# Patient Record
Sex: Female | Born: 1940 | Race: White | Hispanic: No | Smoking: Never smoker
Health system: Southern US, Community
[De-identification: ages and names within clinical notes are randomized; demographics above are authoritative.]

## PROBLEM LIST (undated history)

## (undated) DIAGNOSIS — R7303 Prediabetes: Secondary | ICD-10-CM

## (undated) DIAGNOSIS — S42409A Unspecified fracture of lower end of unspecified humerus, initial encounter for closed fracture: Secondary | ICD-10-CM

## (undated) DIAGNOSIS — M329 Systemic lupus erythematosus, unspecified: Secondary | ICD-10-CM

## (undated) DIAGNOSIS — K222 Esophageal obstruction: Secondary | ICD-10-CM

## (undated) DIAGNOSIS — N951 Menopausal and female climacteric states: Secondary | ICD-10-CM

## (undated) DIAGNOSIS — F419 Anxiety disorder, unspecified: Secondary | ICD-10-CM

## (undated) DIAGNOSIS — F439 Reaction to severe stress, unspecified: Secondary | ICD-10-CM

## (undated) DIAGNOSIS — N824 Other female intestinal-genital tract fistulae: Secondary | ICD-10-CM

## (undated) DIAGNOSIS — K219 Gastro-esophageal reflux disease without esophagitis: Secondary | ICD-10-CM

## (undated) DIAGNOSIS — T7840XA Allergy, unspecified, initial encounter: Secondary | ICD-10-CM

## (undated) DIAGNOSIS — F32A Depression, unspecified: Secondary | ICD-10-CM

## (undated) DIAGNOSIS — J40 Bronchitis, not specified as acute or chronic: Secondary | ICD-10-CM

## (undated) DIAGNOSIS — I499 Cardiac arrhythmia, unspecified: Secondary | ICD-10-CM

## (undated) DIAGNOSIS — E119 Type 2 diabetes mellitus without complications: Secondary | ICD-10-CM

## (undated) DIAGNOSIS — L930 Discoid lupus erythematosus: Secondary | ICD-10-CM

## (undated) DIAGNOSIS — H269 Unspecified cataract: Secondary | ICD-10-CM

## (undated) DIAGNOSIS — M1990 Unspecified osteoarthritis, unspecified site: Secondary | ICD-10-CM

## (undated) DIAGNOSIS — F329 Major depressive disorder, single episode, unspecified: Secondary | ICD-10-CM

## (undated) DIAGNOSIS — Z79890 Hormone replacement therapy (postmenopausal): Secondary | ICD-10-CM

## (undated) DIAGNOSIS — Z8719 Personal history of other diseases of the digestive system: Secondary | ICD-10-CM

## (undated) DIAGNOSIS — E785 Hyperlipidemia, unspecified: Secondary | ICD-10-CM

## (undated) DIAGNOSIS — I10 Essential (primary) hypertension: Secondary | ICD-10-CM

## (undated) DIAGNOSIS — M8580 Other specified disorders of bone density and structure, unspecified site: Secondary | ICD-10-CM

## (undated) HISTORY — DX: Hyperlipidemia, unspecified: E78.5

## (undated) HISTORY — DX: Menopausal and female climacteric states: N95.1

## (undated) HISTORY — PX: TUBAL LIGATION: SHX77

## (undated) HISTORY — DX: Allergy, unspecified, initial encounter: T78.40XA

## (undated) HISTORY — DX: Esophageal obstruction: K22.2

## (undated) HISTORY — PX: EYE SURGERY: SHX253

## (undated) HISTORY — PX: TONSILLECTOMY: SUR1361

## (undated) HISTORY — PX: COLONOSCOPY: SHX174

## (undated) HISTORY — DX: Anxiety disorder, unspecified: F41.9

## (undated) HISTORY — DX: Prediabetes: R73.03

## (undated) HISTORY — DX: Unspecified fracture of lower end of unspecified humerus, initial encounter for closed fracture: S42.409A

## (undated) HISTORY — DX: Reaction to severe stress, unspecified: F43.9

## (undated) HISTORY — DX: Unspecified cataract: H26.9

## (undated) HISTORY — DX: Other female intestinal-genital tract fistulae: N82.4

## (undated) HISTORY — DX: Systemic lupus erythematosus, unspecified: M32.9

## (undated) HISTORY — DX: Hormone replacement therapy: Z79.890

## (undated) HISTORY — DX: Essential (primary) hypertension: I10

## (undated) HISTORY — DX: Discoid lupus erythematosus: L93.0

## (undated) HISTORY — DX: Other specified disorders of bone density and structure, unspecified site: M85.80

## (undated) HISTORY — PX: ABDOMINAL HYSTERECTOMY: SHX81

---

## 1969-05-02 DIAGNOSIS — S42409A Unspecified fracture of lower end of unspecified humerus, initial encounter for closed fracture: Secondary | ICD-10-CM

## 1969-05-02 HISTORY — DX: Unspecified fracture of lower end of unspecified humerus, initial encounter for closed fracture: S42.409A

## 1970-09-01 HISTORY — PX: OTHER SURGICAL HISTORY: SHX169

## 1974-09-01 HISTORY — PX: OTHER SURGICAL HISTORY: SHX169

## 1985-09-01 HISTORY — PX: OTHER SURGICAL HISTORY: SHX169

## 1998-03-30 ENCOUNTER — Ambulatory Visit (HOSPITAL_COMMUNITY): Admission: RE | Admit: 1998-03-30 | Discharge: 1998-03-30 | Payer: Self-pay | Admitting: *Deleted

## 1999-12-30 ENCOUNTER — Emergency Department (HOSPITAL_COMMUNITY): Admission: EM | Admit: 1999-12-30 | Discharge: 1999-12-30 | Payer: Self-pay | Admitting: Emergency Medicine

## 1999-12-30 ENCOUNTER — Encounter: Payer: Self-pay | Admitting: Emergency Medicine

## 2000-01-06 ENCOUNTER — Ambulatory Visit (HOSPITAL_COMMUNITY): Admission: RE | Admit: 2000-01-06 | Discharge: 2000-01-06 | Payer: Self-pay | Admitting: Family Medicine

## 2000-01-06 ENCOUNTER — Encounter: Payer: Self-pay | Admitting: Family Medicine

## 2000-05-01 ENCOUNTER — Encounter: Payer: Self-pay | Admitting: Family Medicine

## 2000-05-01 ENCOUNTER — Encounter: Admission: RE | Admit: 2000-05-01 | Discharge: 2000-05-01 | Payer: Self-pay | Admitting: Family Medicine

## 2001-02-18 ENCOUNTER — Ambulatory Visit (HOSPITAL_COMMUNITY): Admission: RE | Admit: 2001-02-18 | Discharge: 2001-02-18 | Payer: Self-pay | Admitting: Gastroenterology

## 2001-12-07 ENCOUNTER — Ambulatory Visit (HOSPITAL_BASED_OUTPATIENT_CLINIC_OR_DEPARTMENT_OTHER): Admission: RE | Admit: 2001-12-07 | Discharge: 2001-12-07 | Payer: Self-pay | Admitting: Otolaryngology

## 2002-06-09 ENCOUNTER — Encounter: Payer: Self-pay | Admitting: Urology

## 2002-06-09 ENCOUNTER — Encounter: Admission: RE | Admit: 2002-06-09 | Discharge: 2002-06-09 | Payer: Self-pay | Admitting: Urology

## 2002-09-06 ENCOUNTER — Ambulatory Visit (HOSPITAL_COMMUNITY): Admission: RE | Admit: 2002-09-06 | Discharge: 2002-09-06 | Payer: Self-pay | Admitting: Family Medicine

## 2002-09-06 ENCOUNTER — Encounter: Payer: Self-pay | Admitting: Family Medicine

## 2003-02-08 ENCOUNTER — Other Ambulatory Visit: Admission: RE | Admit: 2003-02-08 | Discharge: 2003-02-08 | Payer: Self-pay | Admitting: Family Medicine

## 2003-05-04 ENCOUNTER — Ambulatory Visit (HOSPITAL_COMMUNITY): Admission: RE | Admit: 2003-05-04 | Discharge: 2003-05-04 | Payer: Self-pay | Admitting: Otolaryngology

## 2003-05-10 ENCOUNTER — Encounter: Payer: Self-pay | Admitting: Otolaryngology

## 2003-05-10 ENCOUNTER — Encounter: Admission: RE | Admit: 2003-05-10 | Discharge: 2003-05-10 | Payer: Self-pay | Admitting: Otolaryngology

## 2003-05-11 ENCOUNTER — Ambulatory Visit (HOSPITAL_BASED_OUTPATIENT_CLINIC_OR_DEPARTMENT_OTHER): Admission: RE | Admit: 2003-05-11 | Discharge: 2003-05-11 | Payer: Self-pay | Admitting: Otolaryngology

## 2005-09-01 LAB — HM MAMMOGRAPHY

## 2006-02-25 ENCOUNTER — Ambulatory Visit: Payer: Self-pay | Admitting: Cardiology

## 2006-03-10 ENCOUNTER — Ambulatory Visit: Payer: Self-pay

## 2006-07-13 ENCOUNTER — Ambulatory Visit: Payer: Self-pay | Admitting: Cardiology

## 2006-07-22 ENCOUNTER — Ambulatory Visit: Payer: Self-pay

## 2006-07-22 ENCOUNTER — Encounter: Payer: Self-pay | Admitting: Cardiology

## 2006-07-22 ENCOUNTER — Ambulatory Visit: Payer: Self-pay | Admitting: Cardiology

## 2006-09-04 ENCOUNTER — Ambulatory Visit: Payer: Self-pay | Admitting: Cardiology

## 2006-10-20 ENCOUNTER — Ambulatory Visit: Payer: Self-pay | Admitting: Cardiology

## 2007-04-07 ENCOUNTER — Ambulatory Visit: Payer: Self-pay | Admitting: Cardiology

## 2007-12-01 ENCOUNTER — Encounter: Admission: RE | Admit: 2007-12-01 | Discharge: 2007-12-01 | Payer: Self-pay | Admitting: Obstetrics and Gynecology

## 2008-12-08 ENCOUNTER — Encounter: Payer: Self-pay | Admitting: Cardiology

## 2008-12-23 ENCOUNTER — Emergency Department (HOSPITAL_COMMUNITY): Admission: EM | Admit: 2008-12-23 | Discharge: 2008-12-23 | Payer: Self-pay | Admitting: Emergency Medicine

## 2009-01-22 ENCOUNTER — Encounter: Payer: Self-pay | Admitting: Cardiology

## 2009-01-24 ENCOUNTER — Encounter: Payer: Self-pay | Admitting: Cardiology

## 2009-02-01 DIAGNOSIS — R42 Dizziness and giddiness: Secondary | ICD-10-CM | POA: Insufficient documentation

## 2009-02-01 DIAGNOSIS — R7303 Prediabetes: Secondary | ICD-10-CM | POA: Insufficient documentation

## 2009-02-01 DIAGNOSIS — I10 Essential (primary) hypertension: Secondary | ICD-10-CM

## 2009-02-01 DIAGNOSIS — I152 Hypertension secondary to endocrine disorders: Secondary | ICD-10-CM | POA: Insufficient documentation

## 2009-02-01 DIAGNOSIS — R002 Palpitations: Secondary | ICD-10-CM | POA: Insufficient documentation

## 2009-02-01 DIAGNOSIS — E785 Hyperlipidemia, unspecified: Secondary | ICD-10-CM

## 2009-02-01 DIAGNOSIS — E1169 Type 2 diabetes mellitus with other specified complication: Secondary | ICD-10-CM | POA: Insufficient documentation

## 2009-02-02 ENCOUNTER — Ambulatory Visit: Payer: Self-pay | Admitting: Cardiology

## 2009-02-02 DIAGNOSIS — R55 Syncope and collapse: Secondary | ICD-10-CM | POA: Insufficient documentation

## 2009-02-19 ENCOUNTER — Telehealth (INDEPENDENT_AMBULATORY_CARE_PROVIDER_SITE_OTHER): Payer: Self-pay | Admitting: *Deleted

## 2009-02-20 ENCOUNTER — Encounter: Payer: Self-pay | Admitting: Cardiology

## 2009-02-20 ENCOUNTER — Ambulatory Visit: Payer: Self-pay

## 2009-04-03 ENCOUNTER — Ambulatory Visit: Payer: Self-pay | Admitting: Cardiology

## 2009-10-04 ENCOUNTER — Ambulatory Visit: Payer: Self-pay | Admitting: Cardiology

## 2010-09-24 ENCOUNTER — Encounter: Payer: Self-pay | Admitting: Cardiology

## 2010-10-03 NOTE — Assessment & Plan Note (Signed)
Summary: F6M/DM   CC:  dizziness.  History of Present Illness: Misty Smith returns for followup.  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  She had a syncopal episode in April of 2010  that we felt was most likely vagal. An echocardiogram showed normal LV function. A Myoview showed an ejection fraction of 80% and normal perfusion. These were performed on February 20, 2100. I last saw her in August of 2010. Since then the patient has dyspnea with more extreme activities but not with routine activities. It is relieved with rest. It is not associated with chest pain. There is no orthopnea, PND or pedal edema. There is no syncope. There is no exertional chest pain. She continues to have brief palpitations that are nonsustained. They're described as a skip and not associated with syncope, chest pain or shortness of breath.   Current Medications (verified): 1)  Actos 30 Mg Tabs (Pioglitazone Hcl) .Marland Kitchen.. 1 Tab By Mouth Once Daily 2)  Vytorin 10-80 Mg Tabs (Ezetimibe-Simvastatin) .... Take One Tablet By Mouth Dailyat Bedtimer 3)  Lisinopril 20 Mg Tabs (Lisinopril) .... Take One Tablet By Mouth Daily 4)  Actonel 150 Mg Tabs (Risedronate Sodium) .Marland Kitchen.. 1 Tab By Mouth Monthly 5)  Metoprolol Tartrate 50 Mg Tabs (Metoprolol Tartrate) .... 1/2 Tab Am and 1 Tab Pm 6)  Prevacid 30 Mg Cpdr (Lansoprazole) .Marland Kitchen.. 1 Tab By Mouth Once Daily 7)  Lovaza 1 Gm Caps (Omega-3-Acid Ethyl Esters) .... 2 Am 2 Pm 8)  Citracal .... 1 Tab By Mouth Once Daily 9)  Multivitamins   Tabs (Multiple Vitamin) .Marland Kitchen.. 1 Tab By Mouth Once Daily 10)  Glucosamine .Marland Kitchen.. 1 Tab By Mouth Two Times A Day 11)  Xanax 0.25 Mg Tabs (Alprazolam) .... As Needed 12)  Lexapro 5 Mg Tabs (Escitalopram Oxalate) .Marland Kitchen.. 1 Tab By Mouth Once Daily  Allergies: 1)  ! Pcn 2)  ! Sulfa  Past History:  Past Medical History: Current Problems:  DM (ICD-250.00) PALPITATIONS  (ICD-785.1) HYPERLIPIDEMIA (ICD-272.4) HYPERTENSION (ICD-401.9)  Past Surgical History: Reviewed history from 02/01/2009 and no changes required.  Bilateral myringotomies with tubes type 1 Paparella.  tonisills removed  hysterectomy  elbow  tubilgation  Social History: Reviewed history from 02/01/2009 and no changes required. Retired  Married  Tobacco Use - No.  Alcohol Use - no  Review of Systems       Some arthralgias but no fevers or chills, productive cough, hemoptysis, dysphasia, odynophagia, melena, hematochezia, dysuria, hematuria, rash, seizure activity, orthopnea, PND, pedal edema, claudication. Remaining systems are negative.   Vital Signs:  Patient profile:   70 year old female Height:      61 inches Weight:      160 pounds BMI:     30.34 Pulse rate:   51 / minute Resp:     12 per minute BP sitting:   140 / 70  (left arm)  Vitals Entered By: Misty Smith (October 04, 2009 9:38 AM)  Physical Exam  General:  Well-developed well-nourished in no acute distress.  Skin is warm and dry.  HEENT is normal.  Neck is supple. No thyromegaly.  Chest is clear to auscultation with normal expansion.  Cardiovascular exam is regular rate and rhythm.  Abdominal exam nontender or distended. No masses palpated. Extremities show no edema. neuro grossly intact    EKG  Procedure date:  10/04/2009  Findings:  Sinus bradycardia at a rate of 52. Axis normal. No ST changes.  Impression & Recommendations:  Problem # 1:  SYNCOPE (ICD-780.2) The patient has had no further episodes. I feel like her previous event was a vaguely mediated syncope. If she has further episodes in the future then we will schedule a monitor. Her updated medication list for this problem includes:    Lisinopril 20 Mg Tabs (Lisinopril) .Marland Kitchen... Take one tablet by mouth daily    Metoprolol Tartrate 50 Mg Tabs (Metoprolol tartrate) .Marland Kitchen... 1/2 tab am and 1 tab pm  Problem # 2:  PALPITATIONS  (ICD-785.1) She continues to have occasional brief palpitations but these do not appear to be significantly bothersome. Continue beta blocker. 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 Her updated medication list for this problem includes:    Lisinopril 20 Mg Tabs (Lisinopril) .Marland Kitchen... Take one tablet by mouth daily    Metoprolol Tartrate 50 Mg Tabs (Metoprolol tartrate) .Marland Kitchen... 1/2 tab am and 1 tab pm  Problem # 3:  HYPERLIPIDEMIA (ICD-272.4) Continue present medications. Lipids and liver monitored by primary care. Her updated medication list for this problem includes:    Vytorin 10-80 Mg Tabs (Ezetimibe-simvastatin) .Marland Kitchen... Take one tablet by mouth dailyat bedtimer    Lovaza 1 Gm Caps (Omega-3-acid ethyl esters) .Marland Kitchen... 2 am 2 pm  Problem # 4:  HYPERTENSION (ICD-401.9) Blood pressure controlled on present medications. Will continue. Her updated medication list for this problem includes:    Lisinopril 20 Mg Tabs (Lisinopril) .Marland Kitchen... Take one tablet by mouth daily    Metoprolol Tartrate 50 Mg Tabs (Metoprolol tartrate) .Marland Kitchen... 1/2 tab am and 1 tab pm  Problem # 5:  DM (ICD-250.00) Management per primary care. Her updated medication list for this problem includes:    Actos 30 Mg Tabs (Pioglitazone hcl) .Marland Kitchen... 1 tab by mouth once daily    Lisinopril 20 Mg Tabs (Lisinopril) .Marland Kitchen... Take one tablet by mouth daily  Patient Instructions: 1)  Your physician recommends that you schedule a follow-up appointment in: ONE YEAR

## 2010-10-28 ENCOUNTER — Encounter: Payer: Self-pay | Admitting: Cardiology

## 2010-10-28 ENCOUNTER — Other Ambulatory Visit (INDEPENDENT_AMBULATORY_CARE_PROVIDER_SITE_OTHER): Payer: Medicare Other

## 2010-10-28 ENCOUNTER — Ambulatory Visit (INDEPENDENT_AMBULATORY_CARE_PROVIDER_SITE_OTHER): Payer: Medicare Other | Admitting: Cardiology

## 2010-10-28 DIAGNOSIS — R0989 Other specified symptoms and signs involving the circulatory and respiratory systems: Secondary | ICD-10-CM

## 2010-10-28 DIAGNOSIS — I10 Essential (primary) hypertension: Secondary | ICD-10-CM

## 2010-10-28 DIAGNOSIS — R002 Palpitations: Secondary | ICD-10-CM

## 2010-11-07 NOTE — Assessment & Plan Note (Signed)
Summary: f1y/dm-rs per pt [email or web address] appt [email or web address]   CC:  pt complains of dizziness pt states she thinks its related to inner ear .  History of Present Illness: Misty Smith returns for followup.  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  She had a syncopal episode in April of 2010  that we felt was most likely vagal. An echocardiogram showed normal LV function. A Myoview showed an ejection fraction of 80% and normal perfusion. These were performed on February 20, 2009. I last saw her in Feb 2011. Since then, the patient has dyspnea with more extreme activities but not with routine activities. It is relieved with rest. It is not associated with chest pain. There is no orthopnea, PND or pedal edema. There is no syncope or palpitations. There is no exertional chest pain.   Current Medications (verified): 1)  Actos 30 Mg Tabs (Pioglitazone Hcl) .Marland Kitchen.. 1 Tab By Mouth Once Daily 2)  Lisinopril 10 Mg Tabs (Lisinopril) .... Take One Tablet By Mouth Daily 3)  Actonel 150 Mg Tabs (Risedronate Sodium) .Marland Kitchen.. 1 Tab By Mouth Monthly 4)  Metoprolol Tartrate 50 Mg Tabs (Metoprolol Tartrate) .... 1/2 Tab Am and 1 Tab Pm 5)  Nexium 40 Mg Cpdr (Esomeprazole Magnesium) .Marland Kitchen.. 1  Tab By Mouth Once Daily 6)  Lovaza 1 Gm Caps (Omega-3-Acid Ethyl Esters) .... 2 Am 2 Pm 7)  Citracal .... 1 Tab By Mouth Once Daily 8)  Multivitamins   Tabs (Multiple Vitamin) .Marland Kitchen.. 1 Tab By Mouth Once Daily 9)  Xanax 0.25 Mg Tabs (Alprazolam) .... As Needed 10)  Lexapro 10 Mg Tabs (Escitalopram Oxalate) .Marland Kitchen.. 1 Tab By Mouth Once Daily 11)  Lipitor 40 Mg Tabs (Atorvastatin Calcium) .... Take One Tablet By Mouth Daily. 12)  Preservision Areds  Caps (Multiple Vitamins-Minerals) .Marland Kitchen.. 1  Tab By Mouth Once Daily 13)  Estrace 0.1 Mg/gm Crea (Estradiol) .... As Directed  Allergies: 1)  ! Pcn 2)  ! Sulfa  Past History:  Past Medical History: DM  (ICD-250.00) PALPITATIONS (ICD-785.1) HYPERLIPIDEMIA (ICD-272.4) HYPERTENSION (ICD-401.9)  Past Surgical History: Bilateral myringotomies with tubes type 1 Paparella.  tonisills removed  hysterectomy  elbow  tubilgation  Social History: Reviewed history from 02/01/2009 and no changes required. Retired  Married  Tobacco Use - No.  Alcohol Use - no  Review of Systems       Arthralgias but no fevers or chills, productive cough, hemoptysis, dysphasia, odynophagia, melena, hematochezia, dysuria, hematuria, rash, seizure activity, orthopnea, PND, pedal edema, claudication. Remaining systems are negative.   Vital Signs:  Patient profile:   70 year old female Height:      61 inches Weight:      167 pounds BMI:     31.67 Pulse rate:   48 / minute Resp:     14 per minute BP sitting:   130 / 80  (left arm)  Vitals Entered By: Kem Parkinson (October 28, 2010 11:18 AM)  Physical Exam  General:  Well-developed well-nourished in no acute distress.  Skin is warm and dry.  HEENT is normal.  Neck is supple. No thyromegaly.  Chest is clear to auscultation with normal expansion.  Cardiovascular exam is regular rhythm and bradycardic. Abdominal exam nontender or distended. No masses palpated. Extremities show no edema. neuro grossly intact    EKG  Procedure date:  10/28/2010  Findings:      Marked sinus bradycardia at a  rate of 48. No ST changes.  Impression & Recommendations:  Problem # 1:  DM (ICD-250.00) Management per primary care. Her updated medication list for this problem includes:    Actos 30 Mg Tabs (Pioglitazone hcl) .Marland Kitchen... 1 tab by mouth once daily    Lisinopril 10 Mg Tabs (Lisinopril) .Marland Kitchen... Take one tablet by mouth daily  Problem # 2:  PALPITATIONS (ICD-785.1) Controlled with beta blockade. However her rate is mildly decreased. Decrease metoprolol to 25 mg p.o. b.i.d. Her updated medication list for this problem includes:    Lisinopril 10 Mg Tabs  (Lisinopril) .Marland Kitchen... Take one tablet by mouth daily    Metoprolol Tartrate 50 Mg Tabs (Metoprolol tartrate) ..... One half tabler by mouth two times a day  Problem # 3:  HYPERLIPIDEMIA (ICD-272.4) Continue present medications. Check lipids and liver. The following medications were removed from the medication list:    Vytorin 10-80 Mg Tabs (Ezetimibe-simvastatin) .Marland Kitchen... Take one tablet by mouth dailyat bedtimer Her updated medication list for this problem includes:    Lovaza 1 Gm Caps (Omega-3-acid ethyl esters) .Marland Kitchen... 2 am 2 pm    Lipitor 40 Mg Tabs (Atorvastatin calcium) .Marland Kitchen... Take one tablet by mouth daily.  Problem # 4:  HYPERTENSION (ICD-401.9) Blood pressure controlled. Her updated medication list for this problem includes:    Lisinopril 10 Mg Tabs (Lisinopril) .Marland Kitchen... Take one tablet by mouth daily    Metoprolol Tartrate 50 Mg Tabs (Metoprolol tartrate) ..... One half tabler by mouth two times a day  Patient Instructions: 1)  Your physician has recommended you make the following change in your medication: DECREASE METOPROLOL 25MG  TWICE DAILY 2)  Your physician wants you to follow-up in: ONE YEAR   You will receive a reminder letter in the mail two months in advance. If you don't receive a letter, please call our office to schedule the follow-up appointment. Prescriptions: METOPROLOL TARTRATE 50 MG TABS (METOPROLOL TARTRATE) one half tabler by mouth two times a day  #60 x 12   Entered by:   Deliah Goody, RN   Authorized by:   Ferman Hamming, MD, [facility]   Signed by:   Deliah Goody, RN on 10/28/2010   Method used:   Electronically to        [facility] [phone]* (retail)       [address]       [place], [place]  [address]       Ph: [phone] or [phone]       Fax: [phone]   RxID:   [id number]

## 2010-12-11 LAB — CBC
Platelets: 184 10*3/uL (ref 150–400)
WBC: 7.7 10*3/uL (ref 4.0–10.5)

## 2010-12-11 LAB — DIFFERENTIAL
Basophils Absolute: 0 10*3/uL (ref 0.0–0.1)
Basophils Relative: 0 % (ref 0–1)
Eosinophils Relative: 1 % (ref 0–5)
Lymphocytes Relative: 22 % (ref 12–46)
Monocytes Absolute: 0.6 10*3/uL (ref 0.1–1.0)

## 2010-12-11 LAB — COMPREHENSIVE METABOLIC PANEL
ALT: 36 U/L — ABNORMAL HIGH (ref 0–35)
AST: 48 U/L — ABNORMAL HIGH (ref 0–37)
Albumin: 3.4 g/dL — ABNORMAL LOW (ref 3.5–5.2)
Alkaline Phosphatase: 49 U/L (ref 39–117)
Chloride: 103 mEq/L (ref 96–112)
GFR calc Af Amer: >60 mL/min (ref >60)
Potassium: 4.1 mEq/L (ref 3.5–5.1)
Total Bilirubin: 0.6 mg/dL (ref 0.3–1.2)

## 2010-12-11 LAB — POCT CARDIAC MARKERS: Troponin i, poc: <0.05 ng/mL (ref 0.00–0.09)

## 2010-12-30 ENCOUNTER — Encounter: Payer: Self-pay | Admitting: *Deleted

## 2010-12-31 ENCOUNTER — Encounter: Payer: Self-pay | Admitting: Family Medicine

## 2011-01-14 NOTE — Assessment & Plan Note (Signed)
[facility]                            CARDIOLOGY OFFICE NOTE   Misty, [name]                      MRN:          [id number]  DATE:04/07/2007                            DOB:          01/17/1941    Misty Smith returns for followup.  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  Since I last saw her she feels  well.  She denies any dyspnea, chest pain, or syncope.  She states she  occasionally feels a skip but these are not sustained and appear to be  self-limiting.  Also note that she was having dizziness with standing  previously on higher doses of Lopressor but this is improved on lower  doses.   MEDICATIONS AT PRESENT INCLUDE:  1. Prevacid 30 mg p.o. daily.  2. Actos 30 mg p.o. daily.  3. Vytorin 10 mg/80 mg p.o. nightly.  4. Actonel 35 mg p.o. daily.  5. Caltrate 600 mg plus D b.i.d.  6. Multivitamin.  7. Glucosamine.  8. Omacor 1 gm b.i.d.  9. Lisinopril 10 mg p.o. daily.  10.Lopressor 25 mg in the morning and 50 mg in the evening.  11.Aspirin.   PHYSICAL EXAMINATION:  VITAL SIGNS:  Blood pressure 130/69.  Pulse 61.  She weighs 157 pounds.  HEENT:  Normal.  NECK:  Supple.  CHEST:  Clear.  CARDIOVASCULAR EXAM:  Regular rate and rhythm.  ABDOMINAL EXAM:  Benign.  EXTREMITIES:  Show no edema.  Electrocardiogram shows a sinus rhythm at a rate of 61.  There are no ST  changes noted.   DIAGNOSES:  1. 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  They are self-limiting at present.  We will      continue with her Lopressor at 25 mg in the morning and 50 mg in      the evening.  She understands that these are benign in the setting      of normal LV function.  2. Hypertension - Her blood pressure is well controlled  on her present      medications.  3. Hyperlipidemia - She did bring laboratories today from March 09, 2007.  Her LDL was 94 and her HDL was 56.  She will continue on her      present dose of Statin and this is being followed by Dr. Christell Constant.  4. Diabetes mellitus - per Dr. Christell Constant.  5. Dizziness - This appeared to be orthostatic in nature and has      improved on the lower doses of Lopressor.   I will see her back on an as-needed basis.     Misty Frieze Jens Som, MD, [facility]  Electronically Signed    BSC/MedQ  DD: 04/07/2007  DT: 04/07/2007  Job #: [id number]   cc:   Ernestina Penna, M.D.

## 2011-01-17 NOTE — Assessment & Plan Note (Signed)
[facility]                            CARDIOLOGY OFFICE NOTE   Misty Smith, Misty Smith                      MRN:          [id number]  DATE:09/04/2006                            DOB:          Jan 06, 1941    Misty Smith returns for followup today.  Since I last saw her she has  occasional skips, but these appear to be not problematic.  There is no  chest pain or shortness of breath.  Since we added lisinopril she has  noticed some mild presyncope with standing.   Her medications at present include:  1. Lopressor 50 mg p.o. b.i.d.  2. Prevacid 30 mg p.o. daily.  3. Actos 30 mg p.o. daily.  4. Vytorin 10/80 one p.o. nightly.  5. Actonel.  6. Caltrate.  7. Multivitamin.  8. Glucosamine.  9. Omacor.  10.Metanx.  11.Aspirin.  12.Lisinopril 10 mg p.o. daily.   PHYSICAL EXAMINATION:  Shows a blood pressure 132/67, her pulse is 63.  NECK:  Is supple.  CHEST:  Is clear.  CARDIOVASCULAR:  Shows a regular rate and rhythm.  EXTREMITIES:  Show no edema.   Her electrocardiogram today shows a sinus rhythm at a rate of 63.  There  are occasional PVCs.  The axis is normal. There are no ST changes noted.   DIAGNOSES:  1. Palpitations felt secondary to premature ventricular contractions.  2. Hypertension.  3. Hyperlipidemia.  4. Diabetes mellitus.   PLAN:  Misty Smith is complaining of episodes of presyncope with  standing.  I would like to keep her on her ACE-inhibitor given her  history of diabetes and her beta blocker given her history of PVCs and  palpitation.  It appears that these are worse in the morning after her  a.m. medications.  We will continue with her Lopressor but decrease the  dose to 50 mg in the evening and 25 in the morning and we will continue  with her lisinopril.  I have instructed her to stand slowly and to keep  track of her blood pressure and pulse at home.  She did state that her  pulse had been somewhat low at home but she did not  bring her records.  Her pulse and blood  pressure appear to be doing well today.  I will see her back in  approximately 6 weeks to make sure that her symptoms are stable.     Madolyn Frieze Jens Som, MD, [facility]  Electronically Signed    BSC/MedQ  DD: 09/04/2006  DT: 09/04/2006  Job #: [id number]   cc:   Ernestina Penna, M.D.

## 2011-01-17 NOTE — Op Note (Signed)
[place]  Patient:    Misty Smith, Misty Smith Visit Number: [id number] MRN: [id number]          Service Type: DSU Location: [facility] Attending Physician:  Waldon Merl Dictated by:   Keturah Barre, M.D. Proc. Date: 12/07/01 Admit Date:  12/07/2001 Discharge Date: 12/07/2001   CC:         [facility]  [address]  [place] [address]   Operative Report  PREOPERATIVE DIAGNOSIS:  Bilateral serous otitis.  History of otitis media. History of PE tubes in the past on the right.  POSTOPERATIVE DIAGNOSIS:  Bilateral serous otitis.  History of otitis media. History of PE tubes in the past on the right.  OPERATION PERFORMED:  Bilateral myringotomies with tubes type 1 Paparella.  SURGEON:  Keturah Barre, M.D.  ANESTHESIA:  Local with MAC.  INDICATIONS FOR PROCEDURE:  This patient has had serous otitis for several weeks and attempt was made to do a myringotomy and tubes in the office but she became diaphoretic and anxious and was having a problem with blood pressure decrease and fainting and we felt that it was more risk than we wanted to take in the office environment.  Therefore, she now enters in the outpatient environment to have this done.  DESCRIPTION OF PROCEDURE:  The patient was placed in supine position and under local anesthesia was prepped with Betadine, anesthetized with 1% Xylocaine with epinephrine and in the anteroinferior aspect of the tympanic membrane on the left, myringotomy was carried out and fluid was suctioned from behind the tympanic membrane and a type 1 Paparella PE tube was placed.  On the right side again it was anesthetized after prepping with Betadine and the right tympanic membrane had a very small previous PE tube scar with very thin tympanic membrane.  I made the incision anteriorly and again, suctioned fluid from behind the tympanic membrane and placed a right PE tube type 1  paparella. Pedotic drops were placed postoperatively and this will be continued for five days and we will be following her again in two and a half weeks, then six months and one year. Dictated by:   Keturah Barre, M.D. Attending Physician:  Waldon Merl DD:  12/07/01 TD:  12/07/01 Job: [id number]

## 2011-01-17 NOTE — Op Note (Signed)
Misty Smith, Misty Smith                         ACCOUNT NO.:  [id number]   MEDICAL RECORD NO.:  [id number]                   PATIENT TYPE:  AMB   LOCATION:  DSC                                  FACILITY:  [facility]   PHYSICIAN:  Hermelinda Medicus, M.D.                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   OPERATION PERFORMED:  Bilateral myringotomy and tubes type 2 Paparella done  in the outpatient environment because of vagal reaction in the office.   SURGEON:  Hermelinda Medicus, M.D.   ANESTHESIA:  MAC with local.   ANESTHESIOLOGIST:  Sheldon Silvan, M.D.   DESCRIPTION OF PROCEDURE:  The patient was placed in supine position under  MAC anesthesia.  When the patient was totally relaxed and under some  medication, the ears were prepped with Betadine, all cerumen was removed.  Local anesthesia 1% Xylocaine with epinephrine was injected into the  quadrants of the external ear canal and then myringotomy was carried out on  the right side where thick fluid was suctioned from behind the tympanic  membrane and the type 2 Paparella PE tube was placed without difficulty.  The Pedotic drops were placed postoperatively.  On the right side we then  did also a bilateral myringotomy and tube after prepping and anesthesia and  local anesthesia was placed with 1% Xylocaine with epinephrine and again  some clear fluid was seen.  Type 2 Paparella PE tube was placed without  difficulty followed with Pedotic drops.  The patient was awakened, tolerated  the procedure well and we expect  the Paparella type 2 tubes to last  considerably longer than the previous type 1 and  she is well aware that she cannot get her head under water without using ear  plugs and should use some cotton with Vaseline when she even washes her  hair.  She did very well and is doing well postoperatively.  Follow-up will  be in three weeks, three months, six months, and a year.                                                Hermelinda Medicus, M.D.    JC/MEDQ  D:  05/11/2003  T:  05/12/2003  Job:  [id number]   cc:   Gaynelle Cage, MD  [phone]  [place]  [place]  [address]  Fax: [phone]   Ernestina Penna, M.D.  [address] [place]  [place] [address]  Fax: [phone]

## 2011-01-17 NOTE — Assessment & Plan Note (Signed)
[facility] HEALTHC[facility]                            CARDIOLOGY OFFICE NOTE   CHARMEL, PRONOVOST                      MRN:          [id number]  DATE:10/20/2006                            DOB:          Nov 25, 1940    Ms. Enochs returns for followup today.  Since I last saw her, she states  that her dizziness with standing has improved.  She does have this to a  milder degree at times.  Of note, she also has dizziness with lying back  and changing positions in bed.  She occasionally feels her heart skip,  but there are no sustained palpitations.  There is no chest pain or  shortness of breath.   Her medications at present include:  1. Prevacid 30 mg p.o. daily.  2. Actos 30 mg daily.  3. Vytorin 10/80 nightly.  4. Actonel 35 mg p.o. daily.  5. Caltrate 600 b.i.d.  6. Multivitamin.  7. Glucosamine.  8. Omacor 1 gm p.o. b.i.d.  9. __________ daily.  10.Aspirin 81 mg p.o. daily.  11.Lisinopril 10 mg daily.  12.Lopressor 25 mg in the morning and 50 in the evening.   PHYSICAL EXAMINATION:  Shows a blood pressure of 130/70.  Pulse is 60.  NECK:  Supple.  CHEST:  Clear.  CARDIOVASCULAR EXAM:  Regular rate and rhythm.  EXTREMITIES:  Show no edema.   DIAGNOSES:  1. History of palpitations felt secondary to premature ventricular      contractions.  2. Hypertension.  3. Hyperlipidemia.  4. Diabetes mellitus.  5. Dizziness.   PLAN:  Ms. Egler states that her symptoms of dizziness with standing are  improved on the different beta blocker regimen.  We will continue with  the present medications.  Note, she also states that she does feel some  dizziness with leaning her head back, and also with changing positions  in bed.  She may have a component of vertigo.  Given that her dizziness  does increase with leaning her head back, I have also considered  vertebral insufficiency.  I think this is less likely.  I have asked her  to follow up with Dr. Christell Constant concerning  this issue.  If he feels this is  vertigo, then I will ask him to treat this as indicated.  If he thinks  it may be vertebral  insufficiency, then MRA will be indicated.  I will leave this to him.  He is also checking her lipids and liver.  I will see her back in 6  months.     Madolyn Frieze Jens Som, MD, [facility]  Electronically Signed    BSC/MedQ  DD: 10/20/2006  DT: 10/20/2006  Job #: [id number]   cc:   Ernestina Penna, M.D.

## 2011-01-17 NOTE — Procedures (Signed)
[facility]  Patient:    Misty Smith, Misty Smith                      MRN: [id number] Proc. Date: 02/18/01 Adm. Date:  [id number] Attending:  Nelda Marseille CC:         Monica Becton, M.D.   Procedure Report  PROCEDURE:  Colonoscopy.  INDICATIONS FOR PROCEDURE:  Left sided pain, bright red blood per rectum, due for colonic screening.  Consent was signed after risks, benefits, methods, and options were thoroughly discussed in the office.  MEDICINES USED:  Demerol 70, Versed 8.  DESCRIPTION OF PROCEDURE:  Rectal inspection was pertinent for external hemorrhoids. Digital exam was negative. The pediatric video colonoscope was inserted, easily advanced to the approximate level of the hepatic flexure. At that point, the scope began to loop. We first rolled her on her back, then on her right side and then back to her left side where with was some right sided abdominal pressure, we were then able to advance to the cecum which was identified by the appendiceal orifice and the ileocecal valve. No obvious abnormality was seen on insertion. The scope was slowly withdrawn. She did have a tortuous looping colon but on slow withdrawal through the colon no abnormalities were seen. Once back in the rectum, the scope was retroflexed revealing some internal hemorrhoids. Anorectal pull-through confirmed the hemorrhoids, possibly a small prolapse. Did have a small tear. Not mentioned above, the prep was adequate. There was some liquid stool that required washing and suctioning. The scope was then straightened and readvanced a short ways up the sigmoid, air was suctioned, scope removed. The patient tolerated the procedure adequately. There was no obvious or immediate complications.  ENDOSCOPIC DIAGNOSIS: 1. Internal/external hemorrhoids with tears. 2. Otherwise within normal limits to the cecum except for a tortuous    looping colon.  PLAN:  Go ahead and treat her  hemorrhoids. Try over the counter medicines to start with. Could add prescriptions for ______ [facility] creams or possibly suppositories. Follow-up p.r.n. or in two months. If pain continues probably get a CT next but probably her problem could be due to adhesions. Happy to see back p.r.n. DD:  02/18/01 TD:  02/18/01 Job: [id number]

## 2011-01-17 NOTE — Assessment & Plan Note (Signed)
[facility]                              CARDIOLOGY OFFICE NOTE   BROOKELYNN, HAMOR                      MRN:          [id number]  DATE:07/13/2006                            DOB:          08/22/1941    [name] is a very pleasant 70 year old female that I recently saw for  chest pain. At that time we scheduled her to have a nuclear study which was  performed on March 10, 2006. She was found to have an ejection fraction of  80%. There were electrocardiographic changes but the images were normal with  no ischemia or infarction. It should be noted that she did have a  hypertensive response. Over the past 1-2 months, she has noticed occasional  palpitations. They are described as a skip. They are worse at night. There  is no associated presyncope or syncope and there is no chest pain and no  shortness of breath. She did have an event monitor and I do have some strips  from that. This showed occasional PVCs. She now returns for followup. Note  she does have mild dyspnea on exertion but there is no exertional chest pain  and no history of syncope.   MEDICATIONS:  1. Lopressor 50 mg p.o. b.i.d.  2. Prevacid 30 mg p.o. daily.  3. Actos 30 mg p.o. daily.  4. Vytorin 10/80 q.h.s.  5. Actonel 35 mg p.o. daily.  6. Caltrate.  7. Glucosamine.  8. [place].  9. __________  10.Aspirin 81 mg p.o. daily.   PHYSICAL EXAMINATION:  VITAL SIGNS:  Physical examination today shows a  blood pressure of 146/82 and her pulse is 62.  NECK:  Supple.  CHEST:  Clear.  CARDIOVASCULAR:  Reveals a regular rate and rhythm.  EXTREMITIES:  Show no edema.   Her electrocardiogram shows a sinus rhythm at a rate of 62. There were no  significant ST changes.   DIAGNOSES:  1. 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  2. Hypertension.  3. History of borderline diabetes mellitus.  4. Hyperlipidemia.   PLAN:  Ms. Fluhr presents with palpitations that sound to be PVCs. We have  an  event monitor that does indeed show PVCs. We will continue with the  present dose of Lopressor. Her heart rate has been in the 50s and we will  therefore not increase this dose. She has been noted to be hypertensive  recently and we will add Lisinopril 10 mg p.o. daily. We will check a TSH,  BMET and magnesium in one week. I have explained to Ms. Patriarca that in the  setting of normal left ventricular function, PVCs are benign. She seems to  understand this. I will see her back in approximately 6 weeks to review her  symptoms to see if they have improved on the present medications.     Madolyn Frieze Jens Som, MD, [facility]  Electronically Signed    BSC/MedQ  DD: 07/13/2006  DT: 07/13/2006  Job #: [id number]   cc:   Ernestina Penna, M.D.

## 2011-02-28 ENCOUNTER — Encounter: Payer: Self-pay | Admitting: Cardiology

## 2011-06-09 ENCOUNTER — Encounter: Payer: Self-pay | Admitting: Cardiology

## 2011-09-03 DIAGNOSIS — M25579 Pain in unspecified ankle and joints of unspecified foot: Secondary | ICD-10-CM | POA: Diagnosis not present

## 2011-09-16 ENCOUNTER — Encounter: Payer: Self-pay | Admitting: Cardiology

## 2011-09-16 DIAGNOSIS — E785 Hyperlipidemia, unspecified: Secondary | ICD-10-CM | POA: Diagnosis not present

## 2011-09-16 DIAGNOSIS — E559 Vitamin D deficiency, unspecified: Secondary | ICD-10-CM | POA: Diagnosis not present

## 2011-09-16 DIAGNOSIS — E119 Type 2 diabetes mellitus without complications: Secondary | ICD-10-CM | POA: Diagnosis not present

## 2011-09-16 DIAGNOSIS — I10 Essential (primary) hypertension: Secondary | ICD-10-CM | POA: Diagnosis not present

## 2011-09-17 DIAGNOSIS — S82899A Other fracture of unspecified lower leg, initial encounter for closed fracture: Secondary | ICD-10-CM | POA: Diagnosis not present

## 2011-10-08 DIAGNOSIS — S82899A Other fracture of unspecified lower leg, initial encounter for closed fracture: Secondary | ICD-10-CM | POA: Diagnosis not present

## 2011-10-29 ENCOUNTER — Ambulatory Visit (INDEPENDENT_AMBULATORY_CARE_PROVIDER_SITE_OTHER): Payer: Medicare Other | Admitting: Cardiology

## 2011-10-29 ENCOUNTER — Encounter: Payer: Self-pay | Admitting: Cardiology

## 2011-10-29 VITALS — BP 160/72 | HR 54 | Ht 61.0 in | Wt 171.0 lb

## 2011-10-29 DIAGNOSIS — R002 Palpitations: Secondary | ICD-10-CM

## 2011-10-29 DIAGNOSIS — E785 Hyperlipidemia, unspecified: Secondary | ICD-10-CM | POA: Diagnosis not present

## 2011-10-29 DIAGNOSIS — I10 Essential (primary) hypertension: Secondary | ICD-10-CM | POA: Diagnosis not present

## 2011-10-29 NOTE — Assessment & Plan Note (Signed)
Controlled with beta-blockade. 

## 2011-10-29 NOTE — Progress Notes (Signed)
HPI: Misty Smith returns for followup.  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  She had a syncopal episode in April of 2010  that we felt was most likely vagal. An echocardiogram showed normal LV function. A Myoview showed an ejection fraction of 80% and normal perfusion. These were performed on February 20, 2009. I last saw her in Feb 2012. Since then, the patient has dyspnea with more extreme activities but not with routine activities. It is relieved with rest. It is not associated with chest pain. There is no orthopnea, PND or pedal edema. There is no syncope or palpitations. There is no exertional chest pain.  Current Outpatient Prescriptions  Medication Sig Dispense Refill  . ALPRAZolam (XANAX) 0.25 MG tablet Take 0.25 mg by mouth at bedtime as needed.        Marland Kitchen atorvastatin (LIPITOR) 40 MG tablet Take 40 mg by mouth daily.        . Calcium Carb-Vit D-Soy Isoflav (CALTRATE 600 + SOY PO) Take by mouth.        . escitalopram (LEXAPRO) 10 MG tablet Take 10 mg by mouth daily.        Marland Kitchen esomeprazole (NEXIUM) 40 MG capsule Take 40 mg by mouth daily before breakfast.        . estradiol (ESTRACE VAGINAL) 0.1 MG/GM vaginal cream Place 2 g vaginally daily. For 3 weeks         . lisinopril (PRINIVIL,ZESTRIL) 10 MG tablet Take 10 mg by mouth daily.        . meloxicam (MOBIC) 15 MG tablet Take 15 mg by mouth as needed.       . metoprolol tartrate (LOPRESSOR) 25 MG tablet Take 25 mg by mouth as directed. Take one tablet (25mg ) by mouth in the AM and two tablets (50mg ) by mouth in the PM.       . Multiple Vitamin (MULTIVITAMIN) tablet Take 1 tablet by mouth daily.        Marland Kitchen omega-3 acid ethyl esters (LOVAZA) 1 G capsule Take 2 g by mouth 2 (two) times daily.        . pioglitazone (ACTOS) 30 MG tablet Take 30 mg by mouth daily.        . risedronate (ACTONEL) 150 MG tablet Take 150 mg by mouth every 30 (thirty) days.  with water on empty stomach, nothing by mouth or lie down for next 30 minutes.          Past Medical History  Diagnosis Date  . Anxiety   . Stress   . Colovaginal fistula   . Hiatal hernia   . Hypertension   . Postmenopausal HRT (hormone replacement therapy)   . Hyperlipidemia   . Perimenopausal vasomotor symptoms   . Esophageal stricture   . Subacute lupus erythematosus     Dr. Herma Carson      Past Surgical History  Procedure Date  . Abdominal bypass 1987    fibroids   . Bilateral tubal ligtation 1972  . Tonsillectomy     History   Social History  . Marital Status: Married    Spouse Name: N/A    Number of Children: N/A  . Years of Education: N/A   Occupational History  . Not on file.   Social History Main Topics  . Smoking status: Never Smoker   . Smokeless tobacco: Not on file  . Alcohol Use: No  .  Drug Use: No  . Sexually Active: Not on file   Other Topics Concern  . Not on file   Social History Narrative  . No narrative on file    ROS: Some pain in ankle from previous fracture no fevers or chills, productive cough, hemoptysis, dysphasia, odynophagia, melena, hematochezia, dysuria, hematuria, rash, seizure activity, orthopnea, PND, pedal edema, claudication. Remaining systems are negative.  Physical Exam: Well-developed well-nourished in no acute distress.  Skin is warm and dry.  HEENT is normal.  Neck is supple.  Chest is clear to auscultation with normal expansion.  Cardiovascular exam is bradycardic Abdominal exam nontender or distended. No masses palpated. Extremities show trace edema. neuro grossly intact  ECG sinus rhythm at a rate of 54. No ST changes.

## 2011-10-29 NOTE — Assessment & Plan Note (Signed)
Management per primary care. Continue statin. 

## 2011-10-29 NOTE — Patient Instructions (Signed)
Your physician wants you to follow-up in: ONE YEAR You will receive a reminder letter in the mail two months in advance. If you don't receive a letter, please call our office to schedule the follow-up appointment.  

## 2011-10-29 NOTE — Assessment & Plan Note (Signed)
Blood pressure mildly elevated. However she follows this at home and typically normal. We will continue with present medications. She will check her values at home and we will increase her lisinopril if needed.

## 2011-11-19 DIAGNOSIS — S82899A Other fracture of unspecified lower leg, initial encounter for closed fracture: Secondary | ICD-10-CM | POA: Diagnosis not present

## 2011-11-27 ENCOUNTER — Other Ambulatory Visit: Payer: Self-pay | Admitting: Gastroenterology

## 2011-11-27 DIAGNOSIS — R109 Unspecified abdominal pain: Secondary | ICD-10-CM | POA: Diagnosis not present

## 2011-11-27 DIAGNOSIS — R1310 Dysphagia, unspecified: Secondary | ICD-10-CM | POA: Diagnosis not present

## 2011-11-27 DIAGNOSIS — K5900 Constipation, unspecified: Secondary | ICD-10-CM | POA: Diagnosis not present

## 2011-12-01 ENCOUNTER — Ambulatory Visit
Admission: RE | Admit: 2011-12-01 | Discharge: 2011-12-01 | Disposition: A | Discharge status: Home / Self Care | Payer: Medicare Other | Source: Ambulatory Visit | Attending: Gastroenterology | Admitting: Gastroenterology

## 2011-12-01 DIAGNOSIS — K5900 Constipation, unspecified: Secondary | ICD-10-CM | POA: Diagnosis not present

## 2011-12-01 DIAGNOSIS — R109 Unspecified abdominal pain: Secondary | ICD-10-CM | POA: Diagnosis not present

## 2011-12-01 MED ORDER — IOHEXOL 300 MG/ML  SOLN
100.0000 mL | Freq: Once | INTRAMUSCULAR | Status: AC | PRN
  Administered 2011-12-01: 100 mL via INTRAVENOUS

## 2011-12-30 ENCOUNTER — Encounter: Payer: Self-pay | Admitting: Cardiology

## 2011-12-30 DIAGNOSIS — I10 Essential (primary) hypertension: Secondary | ICD-10-CM | POA: Diagnosis not present

## 2011-12-30 DIAGNOSIS — E119 Type 2 diabetes mellitus without complications: Secondary | ICD-10-CM | POA: Diagnosis not present

## 2011-12-30 DIAGNOSIS — E559 Vitamin D deficiency, unspecified: Secondary | ICD-10-CM | POA: Diagnosis not present

## 2011-12-30 DIAGNOSIS — E785 Hyperlipidemia, unspecified: Secondary | ICD-10-CM | POA: Diagnosis not present

## 2012-01-08 DIAGNOSIS — I10 Essential (primary) hypertension: Secondary | ICD-10-CM | POA: Diagnosis not present

## 2012-01-08 DIAGNOSIS — E785 Hyperlipidemia, unspecified: Secondary | ICD-10-CM | POA: Diagnosis not present

## 2012-01-12 DIAGNOSIS — M545 Low back pain: Secondary | ICD-10-CM | POA: Diagnosis not present

## 2012-01-17 ENCOUNTER — Other Ambulatory Visit: Payer: Self-pay | Admitting: Cardiology

## 2012-01-19 ENCOUNTER — Ambulatory Visit: Payer: Medicare Other | Attending: Orthopedic Surgery | Admitting: Physical Therapy

## 2012-01-19 ENCOUNTER — Other Ambulatory Visit: Payer: Self-pay | Admitting: *Deleted

## 2012-01-19 DIAGNOSIS — R5381 Other malaise: Secondary | ICD-10-CM | POA: Diagnosis not present

## 2012-01-19 DIAGNOSIS — M545 Low back pain, unspecified: Secondary | ICD-10-CM | POA: Insufficient documentation

## 2012-01-19 DIAGNOSIS — IMO0001 Reserved for inherently not codable concepts without codable children: Secondary | ICD-10-CM | POA: Diagnosis not present

## 2012-01-19 MED ORDER — METOPROLOL TARTRATE 25 MG PO TABS: ORAL_TABLET | ORAL | Status: DC

## 2012-01-21 ENCOUNTER — Ambulatory Visit: Payer: Medicare Other | Admitting: Physical Therapy

## 2012-01-27 ENCOUNTER — Ambulatory Visit: Payer: Medicare Other | Admitting: *Deleted

## 2012-01-29 ENCOUNTER — Ambulatory Visit: Payer: Medicare Other | Admitting: *Deleted

## 2012-02-03 ENCOUNTER — Ambulatory Visit: Payer: Medicare Other | Attending: Orthopedic Surgery | Admitting: *Deleted

## 2012-02-03 DIAGNOSIS — N3020 Other chronic cystitis without hematuria: Secondary | ICD-10-CM | POA: Diagnosis not present

## 2012-02-03 DIAGNOSIS — M545 Low back pain, unspecified: Secondary | ICD-10-CM | POA: Insufficient documentation

## 2012-02-03 DIAGNOSIS — N39498 Other specified urinary incontinence: Secondary | ICD-10-CM | POA: Diagnosis not present

## 2012-02-03 DIAGNOSIS — R5381 Other malaise: Secondary | ICD-10-CM | POA: Insufficient documentation

## 2012-02-03 DIAGNOSIS — IMO0001 Reserved for inherently not codable concepts without codable children: Secondary | ICD-10-CM | POA: Insufficient documentation

## 2012-02-03 DIAGNOSIS — N3946 Mixed incontinence: Secondary | ICD-10-CM | POA: Diagnosis not present

## 2012-02-03 DIAGNOSIS — N952 Postmenopausal atrophic vaginitis: Secondary | ICD-10-CM | POA: Diagnosis not present

## 2012-02-05 ENCOUNTER — Ambulatory Visit: Payer: Medicare Other | Admitting: *Deleted

## 2012-02-10 ENCOUNTER — Ambulatory Visit: Payer: Medicare Other | Admitting: *Deleted

## 2012-02-12 ENCOUNTER — Ambulatory Visit: Payer: Medicare Other | Admitting: *Deleted

## 2012-02-16 ENCOUNTER — Ambulatory Visit: Payer: Medicare Other | Admitting: *Deleted

## 2012-02-18 ENCOUNTER — Ambulatory Visit: Payer: Medicare Other | Admitting: *Deleted

## 2012-02-18 DIAGNOSIS — E785 Hyperlipidemia, unspecified: Secondary | ICD-10-CM | POA: Diagnosis not present

## 2012-02-18 DIAGNOSIS — E119 Type 2 diabetes mellitus without complications: Secondary | ICD-10-CM | POA: Diagnosis not present

## 2012-03-26 DIAGNOSIS — D485 Neoplasm of uncertain behavior of skin: Secondary | ICD-10-CM | POA: Diagnosis not present

## 2012-03-26 DIAGNOSIS — L930 Discoid lupus erythematosus: Secondary | ICD-10-CM | POA: Diagnosis not present

## 2012-03-26 DIAGNOSIS — L219 Seborrheic dermatitis, unspecified: Secondary | ICD-10-CM | POA: Diagnosis not present

## 2012-04-13 DIAGNOSIS — N390 Urinary tract infection, site not specified: Secondary | ICD-10-CM | POA: Diagnosis not present

## 2012-04-14 ENCOUNTER — Other Ambulatory Visit: Payer: Self-pay | Admitting: Cardiology

## 2012-04-14 MED ORDER — METOPROLOL TARTRATE 25 MG PO TABS: ORAL_TABLET | ORAL | Status: DC

## 2012-04-14 NOTE — Telephone Encounter (Signed)
They have faxed request and we have not responded/lg

## 2012-04-28 DIAGNOSIS — E785 Hyperlipidemia, unspecified: Secondary | ICD-10-CM | POA: Diagnosis not present

## 2012-04-28 DIAGNOSIS — I10 Essential (primary) hypertension: Secondary | ICD-10-CM | POA: Diagnosis not present

## 2012-04-28 DIAGNOSIS — E559 Vitamin D deficiency, unspecified: Secondary | ICD-10-CM | POA: Diagnosis not present

## 2012-04-29 DIAGNOSIS — D485 Neoplasm of uncertain behavior of skin: Secondary | ICD-10-CM | POA: Diagnosis not present

## 2012-04-29 DIAGNOSIS — L930 Discoid lupus erythematosus: Secondary | ICD-10-CM | POA: Diagnosis not present

## 2012-05-12 DIAGNOSIS — I10 Essential (primary) hypertension: Secondary | ICD-10-CM | POA: Diagnosis not present

## 2012-05-12 DIAGNOSIS — E785 Hyperlipidemia, unspecified: Secondary | ICD-10-CM | POA: Diagnosis not present

## 2012-05-15 DIAGNOSIS — M79609 Pain in unspecified limb: Secondary | ICD-10-CM | POA: Diagnosis not present

## 2012-05-25 DIAGNOSIS — Z01419 Encounter for gynecological examination (general) (routine) without abnormal findings: Secondary | ICD-10-CM | POA: Diagnosis not present

## 2012-05-25 DIAGNOSIS — Z124 Encounter for screening for malignant neoplasm of cervix: Secondary | ICD-10-CM | POA: Diagnosis not present

## 2012-05-26 DIAGNOSIS — Z1231 Encounter for screening mammogram for malignant neoplasm of breast: Secondary | ICD-10-CM | POA: Diagnosis not present

## 2012-06-24 DIAGNOSIS — D485 Neoplasm of uncertain behavior of skin: Secondary | ICD-10-CM | POA: Diagnosis not present

## 2012-06-24 DIAGNOSIS — L930 Discoid lupus erythematosus: Secondary | ICD-10-CM | POA: Diagnosis not present

## 2012-06-28 DIAGNOSIS — Z23 Encounter for immunization: Secondary | ICD-10-CM | POA: Diagnosis not present

## 2012-07-05 DIAGNOSIS — M76899 Other specified enthesopathies of unspecified lower limb, excluding foot: Secondary | ICD-10-CM | POA: Diagnosis not present

## 2012-07-07 ENCOUNTER — Ambulatory Visit: Payer: Medicare Other | Attending: Orthopedic Surgery | Admitting: Physical Therapy

## 2012-07-07 DIAGNOSIS — M25559 Pain in unspecified hip: Secondary | ICD-10-CM | POA: Diagnosis not present

## 2012-07-07 DIAGNOSIS — IMO0001 Reserved for inherently not codable concepts without codable children: Secondary | ICD-10-CM | POA: Insufficient documentation

## 2012-07-07 DIAGNOSIS — R5381 Other malaise: Secondary | ICD-10-CM | POA: Diagnosis not present

## 2012-07-08 ENCOUNTER — Ambulatory Visit: Payer: Medicare Other | Admitting: Physical Therapy

## 2012-07-12 ENCOUNTER — Ambulatory Visit: Payer: Medicare Other | Admitting: Physical Therapy

## 2012-07-14 ENCOUNTER — Ambulatory Visit: Payer: Medicare Other | Admitting: Physical Therapy

## 2012-07-19 ENCOUNTER — Ambulatory Visit: Payer: Medicare Other | Admitting: Physical Therapy

## 2012-07-21 ENCOUNTER — Ambulatory Visit: Payer: Medicare Other | Admitting: Physical Therapy

## 2012-07-26 ENCOUNTER — Ambulatory Visit: Payer: Medicare Other | Admitting: Physical Therapy

## 2012-08-02 ENCOUNTER — Ambulatory Visit: Payer: Medicare Other | Attending: Orthopedic Surgery | Admitting: Physical Therapy

## 2012-08-02 DIAGNOSIS — IMO0001 Reserved for inherently not codable concepts without codable children: Secondary | ICD-10-CM | POA: Diagnosis not present

## 2012-08-02 DIAGNOSIS — R5381 Other malaise: Secondary | ICD-10-CM | POA: Insufficient documentation

## 2012-08-02 DIAGNOSIS — M25559 Pain in unspecified hip: Secondary | ICD-10-CM | POA: Diagnosis not present

## 2012-08-03 DIAGNOSIS — H2510 Age-related nuclear cataract, unspecified eye: Secondary | ICD-10-CM | POA: Diagnosis not present

## 2012-08-03 DIAGNOSIS — H04129 Dry eye syndrome of unspecified lacrimal gland: Secondary | ICD-10-CM | POA: Diagnosis not present

## 2012-08-03 DIAGNOSIS — H40019 Open angle with borderline findings, low risk, unspecified eye: Secondary | ICD-10-CM | POA: Diagnosis not present

## 2012-08-03 DIAGNOSIS — D3130 Benign neoplasm of unspecified choroid: Secondary | ICD-10-CM | POA: Diagnosis not present

## 2012-08-03 DIAGNOSIS — M5137 Other intervertebral disc degeneration, lumbosacral region: Secondary | ICD-10-CM | POA: Diagnosis not present

## 2012-08-04 ENCOUNTER — Encounter: Payer: Medicare Other | Admitting: Physical Therapy

## 2012-08-05 ENCOUNTER — Ambulatory Visit: Payer: Medicare Other | Admitting: Physical Therapy

## 2012-08-05 ENCOUNTER — Encounter: Payer: Self-pay | Admitting: Cardiology

## 2012-08-05 DIAGNOSIS — E559 Vitamin D deficiency, unspecified: Secondary | ICD-10-CM | POA: Diagnosis not present

## 2012-08-05 DIAGNOSIS — E785 Hyperlipidemia, unspecified: Secondary | ICD-10-CM | POA: Diagnosis not present

## 2012-08-05 DIAGNOSIS — I10 Essential (primary) hypertension: Secondary | ICD-10-CM | POA: Diagnosis not present

## 2012-08-07 ENCOUNTER — Other Ambulatory Visit: Payer: Self-pay | Admitting: Cardiology

## 2012-08-09 ENCOUNTER — Ambulatory Visit: Payer: Medicare Other | Admitting: Physical Therapy

## 2012-08-18 DIAGNOSIS — M76899 Other specified enthesopathies of unspecified lower limb, excluding foot: Secondary | ICD-10-CM | POA: Diagnosis not present

## 2012-08-19 DIAGNOSIS — I10 Essential (primary) hypertension: Secondary | ICD-10-CM | POA: Diagnosis not present

## 2012-08-19 DIAGNOSIS — E785 Hyperlipidemia, unspecified: Secondary | ICD-10-CM | POA: Diagnosis not present

## 2012-08-30 DIAGNOSIS — Z1212 Encounter for screening for malignant neoplasm of rectum: Secondary | ICD-10-CM | POA: Diagnosis not present

## 2012-10-08 DIAGNOSIS — M79609 Pain in unspecified limb: Secondary | ICD-10-CM | POA: Diagnosis not present

## 2012-11-17 ENCOUNTER — Other Ambulatory Visit (INDEPENDENT_AMBULATORY_CARE_PROVIDER_SITE_OTHER): Payer: Medicare Other

## 2012-11-17 DIAGNOSIS — E559 Vitamin D deficiency, unspecified: Secondary | ICD-10-CM

## 2012-11-17 DIAGNOSIS — I10 Essential (primary) hypertension: Secondary | ICD-10-CM

## 2012-11-17 DIAGNOSIS — R5383 Other fatigue: Secondary | ICD-10-CM

## 2012-11-17 DIAGNOSIS — E785 Hyperlipidemia, unspecified: Secondary | ICD-10-CM | POA: Diagnosis not present

## 2012-11-17 DIAGNOSIS — R5381 Other malaise: Secondary | ICD-10-CM

## 2012-11-17 LAB — POCT CBC
Hemoglobin: 12.9 g/dL (ref 12.2–16.2)
Lymph, poc: 2.2 (ref 0.6–3.4)
MCH, POC: 30.9 pg (ref 27–31.2)
MCHC: 33.2 g/dL (ref 31.8–35.4)
MCV: 93.2 fL (ref 80–97)
MPV: 8.1 fL (ref 0–99.8)
RBC: 4.2 M/uL (ref 4.04–5.48)
WBC: 6.2 10*3/uL (ref 4.6–10.2)

## 2012-11-17 LAB — HEPATIC FUNCTION PANEL
ALT: 38 U/L — ABNORMAL HIGH (ref 0–35)
AST: 40 U/L — ABNORMAL HIGH (ref 0–37)
Alkaline Phosphatase: 62 U/L (ref 39–117)
Bilirubin, Direct: 0.1 mg/dL (ref 0.0–0.3)
Total Bilirubin: 0.6 mg/dL (ref 0.3–1.2)

## 2012-11-17 LAB — BASIC METABOLIC PANEL
BUN: 12 mg/dL (ref 6–23)
Calcium: 10 mg/dL (ref 8.4–10.5)
Glucose, Bld: 101 mg/dL — ABNORMAL HIGH (ref 70–99)
Potassium: 4.8 mEq/L (ref 3.5–5.3)
Sodium: 139 mEq/L (ref 135–145)

## 2012-11-17 NOTE — Progress Notes (Signed)
Patient here for labs only. 

## 2012-11-20 ENCOUNTER — Other Ambulatory Visit: Payer: Self-pay | Admitting: Family Medicine

## 2012-11-24 DIAGNOSIS — M76899 Other specified enthesopathies of unspecified lower limb, excluding foot: Secondary | ICD-10-CM | POA: Diagnosis not present

## 2012-11-29 ENCOUNTER — Encounter: Payer: Self-pay | Admitting: Cardiology

## 2012-11-29 ENCOUNTER — Ambulatory Visit (INDEPENDENT_AMBULATORY_CARE_PROVIDER_SITE_OTHER): Payer: Medicare Other | Admitting: Cardiology

## 2012-11-29 VITALS — BP 120/70 | HR 49 | Wt 175.0 lb

## 2012-11-29 DIAGNOSIS — E785 Hyperlipidemia, unspecified: Secondary | ICD-10-CM

## 2012-11-29 DIAGNOSIS — R002 Palpitations: Secondary | ICD-10-CM | POA: Diagnosis not present

## 2012-11-29 DIAGNOSIS — I10 Essential (primary) hypertension: Secondary | ICD-10-CM | POA: Diagnosis not present

## 2012-11-29 NOTE — Assessment & Plan Note (Signed)
Continue statin. Lipids and liver monitored by primary care. 

## 2012-11-29 NOTE — Assessment & Plan Note (Signed)
Blood pressure controlled. Continue present medications. 

## 2012-11-29 NOTE — Assessment & Plan Note (Signed)
Reasonably well controlled with metoprolol. She is mildly bradycardic but not having symptoms and I therefore will make no changes.

## 2012-11-29 NOTE — Progress Notes (Signed)
HPI: Mrs. Blickenstaff returns for followup. She is a pleasant female whom I have seen in the past secondary to hypertension, hyperlipidemia, and palpitations felt secondary to PVCs. She had a syncopal episode in April of 2010 that we felt was most likely vagal. An echocardiogram showed normal LV function. A Myoview showed an ejection fraction of 80% and normal perfusion. These were performed on February 20, 2009. I last saw her in Feb 2013. Since then, the patient has dyspnea with more extreme activities but not with routine activities. It is relieved with rest. It is not associated with chest pain. There is no orthopnea, PND or pedal edema. There is no syncope or palpitations. There is no exertional chest pain.   Current Outpatient Prescriptions  Medication Sig Dispense Refill  . ALPRAZolam (XANAX) 0.25 MG tablet Take 0.25 mg by mouth at bedtime as needed.        Marland Kitchen atorvastatin (LIPITOR) 40 MG tablet Take 40 mg by mouth daily.        . Calcium Carb-Vit D-Soy Isoflav (CALTRATE 600 + SOY PO) Take by mouth.        . escitalopram (LEXAPRO) 10 MG tablet Take 10 mg by mouth daily.        Marland Kitchen esomeprazole (NEXIUM) 40 MG capsule Take 40 mg by mouth daily before breakfast.        . estradiol (ESTRACE VAGINAL) 0.1 MG/GM vaginal cream Place 2 g vaginally daily. For 3 weeks         . lisinopril (PRINIVIL,ZESTRIL) 20 MG tablet Take 20 mg by mouth daily.      . meloxicam (MOBIC) 15 MG tablet Take 15 mg by mouth as needed.       . metoprolol tartrate (LOPRESSOR) 25 MG tablet       . Multiple Vitamin (MULTIVITAMIN) tablet Take 1 tablet by mouth daily.        Marland Kitchen omega-3 acid ethyl esters (LOVAZA) 1 G capsule Take 2 g by mouth 2 (two) times daily.        . pioglitazone (ACTOS) 30 MG tablet TAKE 1 TABLET BY MOUTH EVERY DAY  30 tablet  0  . risedronate (ACTONEL) 150 MG tablet Take 150 mg by mouth every 30 (thirty) days. with water on empty stomach, nothing by mouth or lie down for next 30 minutes.        No current  facility-administered medications for this visit.     Past Medical History  Diagnosis Date  . Anxiety   . Stress   . Colovaginal fistula   . Hiatal hernia   . Hypertension   . Postmenopausal HRT (hormone replacement therapy)   . Hyperlipidemia   . Perimenopausal vasomotor symptoms   . Esophageal stricture   . Subacute lupus erythematosus     Dr. Herma Carson      Past Surgical History  Procedure Laterality Date  . Abdominal bypass  1987    fibroids   . Bilateral tubal ligtation  1972  . Tonsillectomy      History   Social History  . Marital Status: Married    Spouse Name: N/A    Number of Children: N/A  . Years of Education: N/A   Occupational History  . Not on file.   Social History Main Topics  . Smoking status: Never Smoker   . Smokeless tobacco: Not on file  . Alcohol Use: No  . Drug Use: No  . Sexually Active: Not on file   Other Topics Concern  .  Not on file   Social History Narrative  . No narrative on file    ROS: no fevers or chills, productive cough, hemoptysis, dysphasia, odynophagia, melena, hematochezia, dysuria, hematuria, rash, seizure activity, orthopnea, PND, pedal edema, claudication. Remaining systems are negative.  Physical Exam: Well-developed well-nourished in no acute distress.  Skin is warm and dry.  HEENT is normal.  Neck is supple.  Chest is clear to auscultation with normal expansion.  Cardiovascular exam is regular rate and rhythm.  Abdominal exam nontender or distended. No masses palpated. Extremities show no edema. neuro grossly intact  ECG marked sinus bradycardia at a rate of 49. No ST changes.

## 2012-11-29 NOTE — Patient Instructions (Addendum)
Continue same medications.   Your physician wants you to follow-up in: 1 year.  You will receive a reminder letter in the mail two months in advance. If you don't receive a letter, please call our office to schedule the follow-up appointment.  

## 2012-12-01 ENCOUNTER — Other Ambulatory Visit: Payer: Self-pay | Admitting: Gastroenterology

## 2012-12-01 DIAGNOSIS — R911 Solitary pulmonary nodule: Secondary | ICD-10-CM

## 2012-12-02 ENCOUNTER — Ambulatory Visit (INDEPENDENT_AMBULATORY_CARE_PROVIDER_SITE_OTHER): Payer: Medicare Other | Admitting: Family Medicine

## 2012-12-02 ENCOUNTER — Encounter: Payer: Self-pay | Admitting: Family Medicine

## 2012-12-02 VITALS — BP 140/71 | HR 52 | Temp 97.0°F | Ht 60.0 in | Wt 173.0 lb

## 2012-12-02 DIAGNOSIS — J309 Allergic rhinitis, unspecified: Secondary | ICD-10-CM

## 2012-12-02 DIAGNOSIS — I10 Essential (primary) hypertension: Secondary | ICD-10-CM

## 2012-12-02 DIAGNOSIS — E785 Hyperlipidemia, unspecified: Secondary | ICD-10-CM | POA: Diagnosis not present

## 2012-12-02 DIAGNOSIS — R42 Dizziness and giddiness: Secondary | ICD-10-CM

## 2012-12-02 DIAGNOSIS — E119 Type 2 diabetes mellitus without complications: Secondary | ICD-10-CM | POA: Diagnosis not present

## 2012-12-02 NOTE — Patient Instructions (Addendum)
Fall precautions discussed Continue current meds and therapeutic lifestyle changes nasacort aq otc is good for allergic rhinitis

## 2012-12-02 NOTE — Progress Notes (Addendum)
Subjective:    Patient ID: Misty Smith, female    DOB: [birth year], 72 y.o.   MRN: [id number]  HPI  This patient presents for recheck of multiple medical problems. Her husband accompanies the patient today.  Patient Active Problem List  Diagnosis  . DM  . HYPERLIPIDEMIA  . HYPERTENSION  . SYNCOPE  . DIZZINESS  . PALPITATIONS    In addition, .see ROS The allergies, current medications, past medical history, surgical history, family and social history are reviewed.  Immunizations reviewed.  Health maintenance reviewed.  The following items are outstanding: None     Review of Systems  HENT: Positive for congestion (slight) and postnasal drip.   Eyes: Positive for itching (slight due to allergies).  Respiratory: Positive for cough (slightly prod, clear).   Cardiovascular: Positive for palpitations (PVC's occasionally). Negative for chest pain.  Gastrointestinal: Positive for nausea.       Heartburn  Genitourinary: Positive for frequency. Negative for dysuria, vaginal bleeding, vaginal discharge, vaginal pain and pelvic pain.  Musculoskeletal: Positive for back pain (LBP), joint swelling and arthralgias (Bilateral hips and knees).  Allergic/Immunologic: Positive for environmental allergies.  Neurological: Positive for dizziness (positional) and headaches (occasional due to sinus).  Psychiatric/Behavioral: Negative.    labs were reviewed with patient, advanced lipid panel with scanned in     Objective:   Physical Exam BP 140/71  Pulse 52  Temp(Src) 97 F (36.1 C) (Oral)  Ht 5' (1.524 m)  Wt 173 lb (78.472 kg)  BMI 33.79 kg/m2  The patient appeared well nourished and normally developed, alert and oriented to time and place. Speech, behavior and judgement appear normal. Vital signs as documented.  Head exam is unremarkable. No scleral icterus or pallor noted.  Neck is without jugular venous distension, thyromegally, or carotid bruits. Carotid upstrokes are brisk  bilaterally. No cervical adenopathy. Lungs are clear anteriorly and posteriorly to auscultation. Normal respiratory effort. Cardiac exam reveals regular rate and rhythm. First and second heart sounds normal. No murmurs, rubs or gallops.  Abdominal exam reveals normal bowl sounds, no masses, no organomegaly and no aortic enlargement. No inguinal adenopathy. Some epigastric tenderness Extremities are nonedematous and both femoral and pedal pulses are normal. Diabetic foot exam was conducted and normal Skin without pallor or jaundice.  Warm and dry, without rash. Neurologic exam reveals normal deep tendon reflexes and normal sensation.  Results for orders placed in visit on 11/17/12  BASIC METABOLIC PANEL      Result Value Range   Sodium 139  135 - 145 mEq/L   Potassium 4.8  3.5 - 5.3 mEq/L   Chloride 100  96 - 112 mEq/L   CO2 30  19 - 32 mEq/L   Glucose, Bld 101 (*) 70 - 99 mg/dL   BUN 12  6 - 23 mg/dL   Creat 7.84  6.96 - 2.95 mg/dL   Calcium 28.4  8.4 - 13.2 mg/dL  NMR, LIPOPROFILE      Result Value Range   Sent to [facility] SENT 4.20.14    VITAMIN D 25 HYDROXY      Result Value Range   Vit D, 25-Hydroxy 53  30 - 89 ng/mL  HEPATIC FUNCTION PANEL      Result Value Range   Total Bilirubin 0.6  0.3 - 1.2 mg/dL   Bilirubin, Direct 0.1  0.0 - 0.3 mg/dL   Indirect Bilirubin 0.5  0.0 - 0.9 mg/dL   Alkaline Phosphatase 62  39 - 117 U/L   AST  40 (*) 0 - 37 U/L   ALT 38 (*) 0 - 35 U/L   Total Protein 6.5  6.0 - 8.3 g/dL   Albumin 4.2  3.5 - 5.2 g/dL  POCT CBC      Result Value Range   WBC 6.2  4.6 - 10.2 K/uL   Lymph, poc 2.2  0.6 - 3.4   POC LYMPH PERCENT 352 (*) 10 - 50 %L   MID (cbc)    0 - 0.9   POC MID %    0 - 12 %M   POC Granulocyte 3.5  2 - 6.9   Granulocyte percent 56.4  37 - 80 %G   RBC 4.2  4.04 - 5.48 M/uL   Hemoglobin 12.9  12.2 - 16.2 g/dL   HCT, POC 57.8  46.9 - 47.9 %   MCV 93.2  80 - 97 fL   MCH, POC 30.9  27 - 31.2 pg   MCHC 33.2  31.8 - 35.4 g/dL   RDW,  POC 62.9     Platelet Count, POC 200.0  142 - 424 K/uL   MPV 8.1  0 - 99.8 fL           Assessment & Plan:  . 1. DM  2. HYPERLIPIDEMIA   3. HYPERTENSION   4. DIZZINESS   5. Allergic rhinitis  6. Continue diet exercise and current medications  7. CT scan of lungs as ordered by Dr. Ewing Schlein

## 2012-12-06 ENCOUNTER — Other Ambulatory Visit: Payer: Medicare Other

## 2012-12-07 ENCOUNTER — Other Ambulatory Visit: Payer: Self-pay | Admitting: Family Medicine

## 2012-12-07 ENCOUNTER — Ambulatory Visit
Admission: RE | Admit: 2012-12-07 | Discharge: 2012-12-07 | Disposition: A | Discharge status: Home / Self Care | Payer: Medicare Other | Source: Ambulatory Visit | Attending: Gastroenterology | Admitting: Gastroenterology

## 2012-12-07 DIAGNOSIS — J984 Other disorders of lung: Secondary | ICD-10-CM | POA: Diagnosis not present

## 2012-12-07 DIAGNOSIS — K449 Diaphragmatic hernia without obstruction or gangrene: Secondary | ICD-10-CM | POA: Diagnosis not present

## 2012-12-07 DIAGNOSIS — R911 Solitary pulmonary nodule: Secondary | ICD-10-CM

## 2012-12-08 ENCOUNTER — Other Ambulatory Visit: Payer: Self-pay | Admitting: *Deleted

## 2012-12-08 DIAGNOSIS — M799 Soft tissue disorder, unspecified: Secondary | ICD-10-CM

## 2012-12-08 DIAGNOSIS — R918 Other nonspecific abnormal finding of lung field: Secondary | ICD-10-CM

## 2012-12-10 ENCOUNTER — Other Ambulatory Visit: Payer: Self-pay | Admitting: *Deleted

## 2012-12-10 DIAGNOSIS — R918 Other nonspecific abnormal finding of lung field: Secondary | ICD-10-CM

## 2012-12-10 DIAGNOSIS — M799 Soft tissue disorder, unspecified: Secondary | ICD-10-CM

## 2012-12-10 DIAGNOSIS — M7989 Other specified soft tissue disorders: Secondary | ICD-10-CM

## 2012-12-10 NOTE — Progress Notes (Signed)
6 month CT of chest recommended to follow-up on soft tissue lesion to the right of the midline.

## 2012-12-28 ENCOUNTER — Other Ambulatory Visit: Payer: Self-pay | Admitting: *Deleted

## 2012-12-28 MED ORDER — PIOGLITAZONE HCL 30 MG PO TABS: 30.0000 mg | ORAL_TABLET | Freq: Every day | ORAL | Status: DC

## 2012-12-28 MED ORDER — LISINOPRIL 20 MG PO TABS: 20.0000 mg | ORAL_TABLET | Freq: Every day | ORAL | Status: DC

## 2013-01-25 ENCOUNTER — Encounter: Payer: Self-pay | Admitting: Family Medicine

## 2013-01-25 ENCOUNTER — Encounter (HOSPITAL_COMMUNITY): Payer: Self-pay

## 2013-01-25 ENCOUNTER — Ambulatory Visit (INDEPENDENT_AMBULATORY_CARE_PROVIDER_SITE_OTHER): Payer: Medicare Other

## 2013-01-25 ENCOUNTER — Ambulatory Visit (HOSPITAL_COMMUNITY)
Admission: RE | Admit: 2013-01-25 | Discharge: 2013-01-25 | Disposition: A | Discharge status: Home / Self Care | Payer: Medicare Other | Source: Ambulatory Visit | Attending: Family Medicine | Admitting: Family Medicine

## 2013-01-25 ENCOUNTER — Ambulatory Visit (INDEPENDENT_AMBULATORY_CARE_PROVIDER_SITE_OTHER): Payer: Medicare Other | Admitting: Family Medicine

## 2013-01-25 VITALS — BP 153/74 | HR 61 | Temp 97.6°F | Ht 60.0 in | Wt 175.0 lb

## 2013-01-25 DIAGNOSIS — M79609 Pain in unspecified limb: Secondary | ICD-10-CM | POA: Diagnosis not present

## 2013-01-25 DIAGNOSIS — S0003XA Contusion of scalp, initial encounter: Secondary | ICD-10-CM | POA: Diagnosis not present

## 2013-01-25 DIAGNOSIS — S62609A Fracture of unspecified phalanx of unspecified finger, initial encounter for closed fracture: Secondary | ICD-10-CM

## 2013-01-25 DIAGNOSIS — R51 Headache: Secondary | ICD-10-CM | POA: Diagnosis not present

## 2013-01-25 DIAGNOSIS — S8000XA Contusion of unspecified knee, initial encounter: Secondary | ICD-10-CM | POA: Diagnosis not present

## 2013-01-25 DIAGNOSIS — M79645 Pain in left finger(s): Secondary | ICD-10-CM

## 2013-01-25 DIAGNOSIS — S62607A Fracture of unspecified phalanx of left little finger, initial encounter for closed fracture: Secondary | ICD-10-CM

## 2013-01-25 DIAGNOSIS — W19XXXA Unspecified fall, initial encounter: Secondary | ICD-10-CM | POA: Insufficient documentation

## 2013-01-25 DIAGNOSIS — S1093XA Contusion of unspecified part of neck, initial encounter: Secondary | ICD-10-CM | POA: Diagnosis not present

## 2013-01-25 DIAGNOSIS — S0093XA Contusion of unspecified part of head, initial encounter: Secondary | ICD-10-CM

## 2013-01-25 DIAGNOSIS — S0083XA Contusion of other part of head, initial encounter: Secondary | ICD-10-CM | POA: Diagnosis not present

## 2013-01-25 DIAGNOSIS — S8001XA Contusion of right knee, initial encounter: Secondary | ICD-10-CM

## 2013-01-25 NOTE — Progress Notes (Signed)
Subjective:    Patient ID: Misty Smith, female    DOB: [birth year], 72 y.o.   MRN: [id number]  HPI Patient stumbled off of a chair earlier that goes to the basement this morning. She hit the back of her. She has fractured her left fifth finger. And she has a contusion to the right knee.   Review of Systems  Eyes: Negative for visual disturbance.  Gastrointestinal: Negative for nausea.  Neurological: Positive for headaches (slight). Negative for dizziness.       Objective:   Physical Exam  Vitals reviewed. Constitutional: She is oriented to person, place, and time. She appears well-developed and well-nourished. No distress.  HENT:  Head: Normocephalic.  Right Ear: External ear normal.  Left Ear: External ear normal.  Nose: Nose normal.  Mouth/Throat: Oropharynx is clear and moist.  She has a hematoma to the occipital scalp  Eyes: Conjunctivae and EOM are normal. Pupils are equal, round, and reactive to light. Right eye exhibits no discharge. Left eye exhibits no discharge. No scleral icterus.  Neck: Normal range of motion. Neck supple. No thyromegaly present.  Cardiovascular: Normal rate, regular rhythm and normal heart sounds.   Pulmonary/Chest: Effort normal and breath sounds normal. No respiratory distress. She has no wheezes. She has no rales. She exhibits no tenderness.  Musculoskeletal: Normal range of motion. She exhibits edema.  The left fifth finger is swollen proximal to the PIP joint. She has a contusion of the right knee and a slight abrasion going down the leg.  Lymphadenopathy:    She has no cervical adenopathy.  Neurological: She is alert and oriented to person, place, and time. She has normal reflexes.  Skin: Skin is warm and dry. She is not diaphoretic.  Psychiatric: She has a normal mood and affect. Her behavior is normal. Judgment and thought content normal.    [facility] reading (PRIMARY) by  Dr. Christell Constant; fracture of fifth proximal phalange                                      Assessment & Plan:  1. Finger pain, left - DG Finger Little Left; Future -Splint  2. Head contusion, initial encounter - CT Head Wo Contrast; Future  3. Knee contusion, right, initial encounter -Dressing applied to abrasion  4. Fracture of phalanx of left little finger, closed, initial encounter -Metal splint applied to the finger   Patient Instructions  Head Injury, Adult You have had a head injury that does not appear serious at this time. A concussion is a state of changed mental ability, usually from a blow to the head. You should take clear liquids for the rest of the day and then resume your regular diet. You should not take sedatives or alcoholic beverages for as long as directed by your caregiver after discharge. After injuries such as yours, most problems occur within the first 24 hours. SYMPTOMS These minor symptoms may be experienced after discharge:  Memory difficulties.  Dizziness.  Headaches.  Double vision.  Hearing difficulties.  Depression.  Tiredness.  Weakness.  Difficulty with concentration. If you experience any of these problems, you should not be alarmed. A concussion requires a few days for recovery. Many patients with head injuries frequently experience such symptoms. Usually, these problems disappear without medical care. If symptoms last for more than one day, notify your caregiver. See your caregiver sooner if symptoms are becoming worse rather than  better. HOME CARE INSTRUCTIONS   During the next 24 hours you must stay with someone who can watch you for the warning signs listed below. Although it is unlikely that serious side effects will occur, you should be aware of signs and symptoms which may necessitate your return to this location. Side effects may occur up to 7  10 days following the injury. It is important for you to carefully monitor your condition and contact your caregiver or seek immediate medical attention if there  is a change in your condition. SEEK IMMEDIATE MEDICAL CARE IF:   There is confusion or drowsiness.  You can not awaken the injured person.  There is nausea (feeling sick to your stomach) or continued, forceful vomiting.  You notice dizziness or unsteadiness which is getting worse, or inability to walk.  You have convulsions or unconsciousness.  You experience severe, persistent headaches not relieved by over-the-counter or prescription medicines for pain. (Do not take aspirin as this impairs clotting abilities). Take other pain medications only as directed.  You can not use arms or legs normally.  There is clear or bloody discharge from the nose or ears. MAKE SURE YOU:   Understand these instructions.  Will watch your condition.  Will get help right away if you are not doing well or get worse. Document Released: 08/18/2005 Document Revised: 11/10/2011 Document Reviewed: 07/06/2009 [facility] Patient Information 2014 [place], [place].     Apply ice to head contusion Apply and elevate left hand and use ice Ice to right knee Follow head injury precautions above and call us immediately if any signs or problems CT of head is being arranged as a precaution If anything is needed for pain take only Tylenol

## 2013-01-25 NOTE — Patient Instructions (Addendum)
Head Injury, Adult You have had a head injury that does not appear serious at this time. A concussion is a state of changed mental ability, usually from a blow to the head. You should take clear liquids for the rest of the day and then resume your regular diet. You should not take sedatives or alcoholic beverages for as long as directed by your caregiver after discharge. After injuries such as yours, most problems occur within the first 24 hours. SYMPTOMS These minor symptoms may be experienced after discharge:  Memory difficulties.  Dizziness.  Headaches.  Double vision.  Hearing difficulties.  Depression.  Tiredness.  Weakness.  Difficulty with concentration. If you experience any of these problems, you should not be alarmed. A concussion requires a few days for recovery. Many patients with head injuries frequently experience such symptoms. Usually, these problems disappear without medical care. If symptoms last for more than one day, notify your caregiver. See your caregiver sooner if symptoms are becoming worse rather than better. HOME CARE INSTRUCTIONS   During the next 24 hours you must stay with someone who can watch you for the warning signs listed below. Although it is unlikely that serious side effects will occur, you should be aware of signs and symptoms which may necessitate your return to this location. Side effects may occur up to 7  10 days following the injury. It is important for you to carefully monitor your condition and contact your caregiver or seek immediate medical attention if there is a change in your condition. SEEK IMMEDIATE MEDICAL CARE IF:   There is confusion or drowsiness.  You can not awaken the injured person.  There is nausea (feeling sick to your stomach) or continued, forceful vomiting.  You notice dizziness or unsteadiness which is getting worse, or inability to walk.  You have convulsions or unconsciousness.  You experience severe,  persistent headaches not relieved by over-the-counter or prescription medicines for pain. (Do not take aspirin as this impairs clotting abilities). Take other pain medications only as directed.  You can not use arms or legs normally.  There is clear or bloody discharge from the nose or ears. MAKE SURE YOU:   Understand these instructions.  Will watch your condition.  Will get help right away if you are not doing well or get worse. Document Released: 08/18/2005 Document Revised: 11/10/2011 Document Reviewed: 07/06/2009 [facility] Patient Information 2014 [place], [place].     Apply ice to head contusion Apply and elevate left hand and use ice Ice to right knee Follow head injury precautions above and call us immediately if any signs or problems CT of head is being arranged as a precaution If anything is needed for pain take only Tylenol

## 2013-02-01 DIAGNOSIS — L930 Discoid lupus erythematosus: Secondary | ICD-10-CM | POA: Diagnosis not present

## 2013-02-01 DIAGNOSIS — D485 Neoplasm of uncertain behavior of skin: Secondary | ICD-10-CM | POA: Diagnosis not present

## 2013-02-02 ENCOUNTER — Ambulatory Visit (INDEPENDENT_AMBULATORY_CARE_PROVIDER_SITE_OTHER): Payer: Medicare Other | Admitting: Family Medicine

## 2013-02-02 ENCOUNTER — Encounter: Payer: Self-pay | Admitting: Family Medicine

## 2013-02-02 VITALS — BP 121/61 | HR 55 | Temp 97.0°F | Ht 60.0 in | Wt 176.0 lb

## 2013-02-02 DIAGNOSIS — IMO0001 Reserved for inherently not codable concepts without codable children: Secondary | ICD-10-CM

## 2013-02-02 DIAGNOSIS — IMO0002 Reserved for concepts with insufficient information to code with codable children: Secondary | ICD-10-CM | POA: Diagnosis not present

## 2013-02-02 DIAGNOSIS — S0093XS Contusion of unspecified part of head, sequela: Secondary | ICD-10-CM

## 2013-02-02 DIAGNOSIS — S8001XS Contusion of right knee, sequela: Secondary | ICD-10-CM

## 2013-02-02 DIAGNOSIS — S62609G Fracture of unspecified phalanx of unspecified finger, subsequent encounter for fracture with delayed healing: Secondary | ICD-10-CM

## 2013-02-02 NOTE — Progress Notes (Signed)
  Subjective:    Patient ID: Misty Smith, female    DOB: 06-Feb-1941, 72 y.o.   MRN: [id number]  HPI Patient returns today for followup of fall 8 days ago. At that time she fractured her fifth finger on the left hand, had a head contusion, and a right knee contusion. Subsequently the finger was splinted. She had a CT scan which showed an occipital hematoma but otherwise normal CT.   Review of Systems  Skin: Positive for color change (bruising L finger fracture, slight swelling).   she has had no neurological sequelae from her fall.No severe headaches, no dizziness and no visual issues.     Objective:   Physical Exam  Constitutional: She is oriented to person, place, and time. She appears well-developed and well-nourished. No distress.  HENT:  Head: Normocephalic and atraumatic.  Right Ear: External ear normal.  Left Ear: External ear normal.  Nose: Nose normal.  Mouth/Throat: Oropharynx is clear and moist. No oropharyngeal exudate.  Eyes: Conjunctivae and EOM are normal. Pupils are equal, round, and reactive to light. Right eye exhibits no discharge. Left eye exhibits no discharge. No scleral icterus.  Neck: Normal range of motion. Neck supple.  Cardiovascular: Normal rate, regular rhythm and normal heart sounds.  Exam reveals no gallop and no friction rub.   No murmur heard. Pulmonary/Chest: Effort normal and breath sounds normal. She has no wheezes. She has no rales. She exhibits no tenderness.  Musculoskeletal: Normal range of motion.  Neurological: She is alert and oriented to person, place, and time.  Skin: Skin is warm and dry. She is not diaphoretic.  Psychiatric: She has a normal mood and affect. Her behavior is normal. Judgment and thought content normal.          Assessment & Plan:  1. Head contusion, sequela  2. Knee contusion, right, sequela  3. Fracture of finger of left hand with delayed healing  Patient Instructions  Continue splinting finger Be careful  not to fall Return to clinic in 4 weeks

## 2013-02-02 NOTE — Patient Instructions (Signed)
Continue splinting finger Be careful not to fall Return to clinic in 4 weeks

## 2013-03-02 ENCOUNTER — Encounter: Payer: Self-pay | Admitting: Family Medicine

## 2013-03-02 ENCOUNTER — Ambulatory Visit (INDEPENDENT_AMBULATORY_CARE_PROVIDER_SITE_OTHER): Payer: Medicare Other | Admitting: Family Medicine

## 2013-03-02 ENCOUNTER — Ambulatory Visit (INDEPENDENT_AMBULATORY_CARE_PROVIDER_SITE_OTHER): Payer: Medicare Other

## 2013-03-02 VITALS — BP 134/61 | HR 59 | Temp 96.9°F | Ht 60.0 in | Wt 176.6 lb

## 2013-03-02 DIAGNOSIS — S62607G Fracture of unspecified phalanx of left little finger, subsequent encounter for fracture with delayed healing: Secondary | ICD-10-CM

## 2013-03-02 DIAGNOSIS — S5290XD Unspecified fracture of unspecified forearm, subsequent encounter for closed fracture with routine healing: Secondary | ICD-10-CM

## 2013-03-02 DIAGNOSIS — S62607D Fracture of unspecified phalanx of left little finger, subsequent encounter for fracture with routine healing: Secondary | ICD-10-CM

## 2013-03-02 NOTE — Progress Notes (Signed)
  Subjective:    Patient ID: Misty Smith, female    DOB: 12-26-1940, 72 y.o.   MRN: [id number]  HPI This is a followup of a fracture of the fifth finger of the left hand. See x-ray report. Patient says she's having minimal pain  Review of Systems  Musculoskeletal: Positive for arthralgias (L little finger frx, pain is improving).       Objective:   Physical Exam Some ulnar deviation of the finger. Slight swelling   [facility] reading (PRIMARY) by  Dr. Christell Constant: Left hand and finger; minimal or no healing at fracture site                                    Assessment & Plan:  Unhealing fracture of fifth metacarpal left hand. -Referral to orthopedist for more stabilization of  Fracture  Patient Instructions  Keep fourth and fifth fingers buddy taped together until visit with orthopedist occurs     .

## 2013-03-02 NOTE — Patient Instructions (Signed)
Keep fourth and fifth fingers buddy taped together until visit with orthopedist occurs

## 2013-03-07 DIAGNOSIS — IMO0002 Reserved for concepts with insufficient information to code with codable children: Secondary | ICD-10-CM | POA: Diagnosis not present

## 2013-03-08 ENCOUNTER — Other Ambulatory Visit: Payer: Self-pay | Admitting: Family Medicine

## 2013-03-14 ENCOUNTER — Ambulatory Visit: Payer: Medicare Other | Attending: Orthopedic Surgery | Admitting: Physical Therapy

## 2013-03-14 DIAGNOSIS — R5381 Other malaise: Secondary | ICD-10-CM | POA: Diagnosis not present

## 2013-03-14 DIAGNOSIS — IMO0001 Reserved for inherently not codable concepts without codable children: Secondary | ICD-10-CM | POA: Insufficient documentation

## 2013-03-14 DIAGNOSIS — IMO0002 Reserved for concepts with insufficient information to code with codable children: Secondary | ICD-10-CM | POA: Insufficient documentation

## 2013-03-14 DIAGNOSIS — Z9181 History of falling: Secondary | ICD-10-CM | POA: Diagnosis not present

## 2013-03-14 DIAGNOSIS — M25649 Stiffness of unspecified hand, not elsewhere classified: Secondary | ICD-10-CM | POA: Insufficient documentation

## 2013-03-28 ENCOUNTER — Other Ambulatory Visit: Payer: Medicare Other

## 2013-04-02 ENCOUNTER — Other Ambulatory Visit: Payer: Self-pay | Admitting: Family Medicine

## 2013-04-11 ENCOUNTER — Ambulatory Visit (INDEPENDENT_AMBULATORY_CARE_PROVIDER_SITE_OTHER): Payer: Medicare Other | Admitting: Family Medicine

## 2013-04-11 ENCOUNTER — Encounter: Payer: Self-pay | Admitting: Family Medicine

## 2013-04-11 VITALS — BP 145/64 | HR 55 | Temp 97.9°F | Ht 60.0 in | Wt 174.2 lb

## 2013-04-11 DIAGNOSIS — I10 Essential (primary) hypertension: Secondary | ICD-10-CM

## 2013-04-11 DIAGNOSIS — E559 Vitamin D deficiency, unspecified: Secondary | ICD-10-CM

## 2013-04-11 DIAGNOSIS — E785 Hyperlipidemia, unspecified: Secondary | ICD-10-CM | POA: Diagnosis not present

## 2013-04-11 DIAGNOSIS — E119 Type 2 diabetes mellitus without complications: Secondary | ICD-10-CM | POA: Diagnosis not present

## 2013-04-11 LAB — POCT CBC
Granulocyte percent: 55.3 %G (ref 37–80)
Hemoglobin: 12.7 g/dL (ref 12.2–16.2)
MPV: 8 fL (ref 0–99.8)
POC Granulocyte: 2.8 (ref 2–6.9)
Platelet Count, POC: 189 10*3/uL (ref 142–424)
RBC: 4.2 M/uL (ref 4.04–5.48)

## 2013-04-11 LAB — POCT GLYCOSYLATED HEMOGLOBIN (HGB A1C): Hemoglobin A1C: 5.8

## 2013-04-11 NOTE — Patient Instructions (Addendum)
Fall precautions discussed Continue current meds and therapeutic lifestyle changes Return to clinic in September or October for a flu shot Monitor blood pressures more regularly and let me review these readings in a couple weeks. Consider trying lisinopril 20 a half of one twice a day Will check with pharmacist about what can be available to replace Nexium so that insurance will cover the cost

## 2013-04-11 NOTE — Addendum Note (Signed)
Addended by: Prescott Gum on: 04/11/2013 10:32 AM   Modules accepted: Orders

## 2013-04-11 NOTE — Progress Notes (Signed)
Subjective:    Patient ID: Misty Smith, female    DOB: 04/28/1941, 72 y.o.   MRN: [id number]  HPI Patient comes in today for followup and management of chronic medical problems. She comes today with her husband. These problems include hypertension and hyperlipidemia and diabetes. She also has a history of palpitations. Also see the review of systems.   Review of Systems  Constitutional: Positive for fatigue (slight).  HENT: Positive for congestion (slight due to allergies), sneezing, postnasal drip and sinus pressure. Negative for ear pain and sore throat.   Eyes: Negative.  Negative for pain, discharge, redness, itching and visual disturbance.  Respiratory: Positive for cough (slightly prod, clear).   Cardiovascular: Positive for palpitations (PVC's, controlled by meds).  Gastrointestinal: Positive for abdominal pain (due to constipation) and constipation (occasional). Negative for nausea, vomiting and diarrhea.  Endocrine: Negative.  Negative for cold intolerance, heat intolerance, polydipsia, polyphagia and polyuria.  Genitourinary: Positive for frequency. Negative for dysuria, hematuria, flank pain, vaginal bleeding, vaginal discharge, enuresis, vaginal pain and dyspareunia.  Musculoskeletal: Positive for back pain (cervical and LBP) and arthralgias (bilateral fingers and knees).  Skin: Positive for color change (Lupus).  Allergic/Immunologic: Positive for environmental allergies.  Neurological: Positive for dizziness (positional) and headaches. Negative for tremors, weakness, light-headedness and numbness.  Psychiatric/Behavioral: Negative for confusion, sleep disturbance and decreased concentration. The patient is not nervous/anxious.        Objective:   Physical Exam BP 145/64  Pulse 55  Temp(Src) 97.9 F (36.6 C) (Oral)  Ht 5' (1.524 m)  Wt 174 lb 3.2 oz (79.017 kg)  BMI 34.02 kg/m2  The patient appeared well nourished and normally developed, alert and oriented to time  and place. Speech, behavior and judgement appear normal. Vital signs as documented.  Head exam is unremarkable. No scleral icterus or pallor noted. She has some nasal congestion bilaterally. Mouth and throat were normal. Neck is without jugular venous distension, thyromegally, or carotid bruits. Carotid upstrokes are brisk bilaterally. No cervical adenopathy. Lungs are clear anteriorly and posteriorly to auscultation. Normal respiratory effort. Cardiac exam reveals regular rate and rhythm at 60 per minute. First and second heart sounds normal.  No murmurs, rubs or gallops.  Abdominal exam reveals obesity, normal bowl sounds, no masses, no organomegaly and no aortic enlargement. No inguinal adenopathy. Extremities are nonedematous and both pedal pulses are normal. Skin without pallor or jaundice.  Warm and dry, without rash. Neurologic exam reveals normal deep tendon reflexes and normal sensation. A. diabetic foot exam was done today          Assessment & Plan:  1. Hypertension - POCT CBC; Standing - POCT CBC  2. Hyperlipemia - Hepatic function panel; Standing - NMR, lipoprofile; Standing - Hepatic function panel - NMR, lipoprofile  3. Vitamin D deficiency - Vitamin D 25 hydroxy; Standing - Vitamin D 25 hydroxy  4. Type 2 diabetes mellitus - POCT glycosylated hemoglobin (Hb A1C); Standing - BMP8+EGFR; Standing - POCT UA - Microalbumin; Standing - POCT glycosylated hemoglobin (Hb A1C) - BMP8+EGFR - POCT UA - Microalbumin Patient Instructions  Fall precautions discussed Continue current meds and therapeutic lifestyle changes Return to clinic in September or October for a flu shot Monitor blood pressures more regularly and let me review these readings in a couple weeks. 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

## 2013-04-13 LAB — NMR, LIPOPROFILE
Cholesterol: 139 mg/dL (ref <200)
HDL Cholesterol by NMR: 48 mg/dL (ref >=40)
LDL Particle Number: 938 nmol/L (ref <1000)
LDLC SERPL CALC-MCNC: 64 mg/dL (ref <100)
LP-IR Score: 35 (ref <=45)

## 2013-04-13 LAB — BMP8+EGFR
BUN/Creatinine Ratio: 14 (ref 11–26)
BUN: 10 mg/dL (ref 8–27)
CO2: 25 mmol/L (ref 18–29)
Calcium: 9.7 mg/dL (ref 8.6–10.2)
Chloride: 101 mmol/L (ref 97–108)
Creatinine, Ser: 0.73 mg/dL (ref 0.57–1.00)
Glucose: 95 mg/dL (ref 65–99)

## 2013-04-13 LAB — HEPATIC FUNCTION PANEL
AST: 45 IU/L — ABNORMAL HIGH (ref 0–40)
Albumin: 4.2 g/dL (ref 3.5–4.8)
Total Bilirubin: 0.4 mg/dL (ref 0.0–1.2)
Total Protein: 6.6 g/dL (ref 6.0–8.5)

## 2013-04-15 ENCOUNTER — Other Ambulatory Visit: Payer: Self-pay | Admitting: Family Medicine

## 2013-04-18 NOTE — Progress Notes (Signed)
Pt aware of lab results/ch 

## 2013-05-19 DIAGNOSIS — M545 Low back pain: Secondary | ICD-10-CM | POA: Diagnosis not present

## 2013-05-19 DIAGNOSIS — S335XXA Sprain of ligaments of lumbar spine, initial encounter: Secondary | ICD-10-CM | POA: Diagnosis not present

## 2013-06-05 ENCOUNTER — Other Ambulatory Visit: Payer: Self-pay | Admitting: Family Medicine

## 2013-06-07 ENCOUNTER — Other Ambulatory Visit: Payer: Self-pay | Admitting: *Deleted

## 2013-06-07 DIAGNOSIS — R911 Solitary pulmonary nodule: Secondary | ICD-10-CM

## 2013-06-08 ENCOUNTER — Ambulatory Visit (INDEPENDENT_AMBULATORY_CARE_PROVIDER_SITE_OTHER): Payer: Medicare Other | Admitting: *Deleted

## 2013-06-08 ENCOUNTER — Encounter (INDEPENDENT_AMBULATORY_CARE_PROVIDER_SITE_OTHER): Payer: Self-pay

## 2013-06-08 DIAGNOSIS — Z23 Encounter for immunization: Secondary | ICD-10-CM | POA: Diagnosis not present

## 2013-06-13 ENCOUNTER — Ambulatory Visit
Admission: RE | Admit: 2013-06-13 | Discharge: 2013-06-13 | Disposition: A | Discharge status: Home / Self Care | Payer: Medicare Other | Source: Ambulatory Visit | Attending: Family Medicine | Admitting: Family Medicine

## 2013-06-13 DIAGNOSIS — J984 Other disorders of lung: Secondary | ICD-10-CM | POA: Diagnosis not present

## 2013-06-13 DIAGNOSIS — R911 Solitary pulmonary nodule: Secondary | ICD-10-CM

## 2013-06-13 DIAGNOSIS — K449 Diaphragmatic hernia without obstruction or gangrene: Secondary | ICD-10-CM | POA: Diagnosis not present

## 2013-06-15 ENCOUNTER — Other Ambulatory Visit: Payer: Self-pay

## 2013-06-15 DIAGNOSIS — R918 Other nonspecific abnormal finding of lung field: Secondary | ICD-10-CM

## 2013-06-29 DIAGNOSIS — M1710 Unilateral primary osteoarthritis, unspecified knee: Secondary | ICD-10-CM | POA: Diagnosis not present

## 2013-06-29 DIAGNOSIS — M76899 Other specified enthesopathies of unspecified lower limb, excluding foot: Secondary | ICD-10-CM | POA: Diagnosis not present

## 2013-07-24 ENCOUNTER — Other Ambulatory Visit: Payer: Self-pay | Admitting: Family Medicine

## 2013-08-02 NOTE — Telephone Encounter (Signed)
This encounter was created in error - please disregard.

## 2013-08-03 DIAGNOSIS — D3130 Benign neoplasm of unspecified choroid: Secondary | ICD-10-CM | POA: Diagnosis not present

## 2013-08-03 DIAGNOSIS — H2510 Age-related nuclear cataract, unspecified eye: Secondary | ICD-10-CM | POA: Diagnosis not present

## 2013-08-03 DIAGNOSIS — E119 Type 2 diabetes mellitus without complications: Secondary | ICD-10-CM | POA: Diagnosis not present

## 2013-08-03 DIAGNOSIS — H04129 Dry eye syndrome of unspecified lacrimal gland: Secondary | ICD-10-CM | POA: Diagnosis not present

## 2013-08-03 DIAGNOSIS — H40019 Open angle with borderline findings, low risk, unspecified eye: Secondary | ICD-10-CM | POA: Diagnosis not present

## 2013-08-22 ENCOUNTER — Other Ambulatory Visit: Payer: Self-pay | Admitting: Family Medicine

## 2013-08-22 DIAGNOSIS — H2510 Age-related nuclear cataract, unspecified eye: Secondary | ICD-10-CM | POA: Diagnosis not present

## 2013-08-22 DIAGNOSIS — D3130 Benign neoplasm of unspecified choroid: Secondary | ICD-10-CM | POA: Diagnosis not present

## 2013-08-23 NOTE — Telephone Encounter (Signed)
Last seen 04/11/13  DWM

## 2013-08-29 DIAGNOSIS — Z1231 Encounter for screening mammogram for malignant neoplasm of breast: Secondary | ICD-10-CM | POA: Diagnosis not present

## 2013-09-16 ENCOUNTER — Other Ambulatory Visit: Payer: Self-pay | Admitting: *Deleted

## 2013-09-16 MED ORDER — ESCITALOPRAM OXALATE 20 MG PO TABS: ORAL_TABLET | ORAL | Status: DC

## 2013-09-21 ENCOUNTER — Other Ambulatory Visit: Payer: Self-pay | Admitting: Family Medicine

## 2013-09-21 ENCOUNTER — Ambulatory Visit (INDEPENDENT_AMBULATORY_CARE_PROVIDER_SITE_OTHER): Payer: Medicare Other | Admitting: Family Medicine

## 2013-09-21 ENCOUNTER — Encounter: Payer: Self-pay | Admitting: Family Medicine

## 2013-09-21 VITALS — BP 153/71 | HR 54 | Temp 97.1°F | Ht 60.0 in | Wt 176.0 lb

## 2013-09-21 DIAGNOSIS — Z780 Asymptomatic menopausal state: Secondary | ICD-10-CM

## 2013-09-21 DIAGNOSIS — K219 Gastro-esophageal reflux disease without esophagitis: Secondary | ICD-10-CM | POA: Insufficient documentation

## 2013-09-21 DIAGNOSIS — Z23 Encounter for immunization: Secondary | ICD-10-CM | POA: Diagnosis not present

## 2013-09-21 DIAGNOSIS — E559 Vitamin D deficiency, unspecified: Secondary | ICD-10-CM

## 2013-09-21 DIAGNOSIS — E8881 Metabolic syndrome: Secondary | ICD-10-CM

## 2013-09-21 DIAGNOSIS — I10 Essential (primary) hypertension: Secondary | ICD-10-CM

## 2013-09-21 DIAGNOSIS — E785 Hyperlipidemia, unspecified: Secondary | ICD-10-CM | POA: Diagnosis not present

## 2013-09-21 DIAGNOSIS — F411 Generalized anxiety disorder: Secondary | ICD-10-CM | POA: Insufficient documentation

## 2013-09-21 DIAGNOSIS — E119 Type 2 diabetes mellitus without complications: Secondary | ICD-10-CM

## 2013-09-21 DIAGNOSIS — R911 Solitary pulmonary nodule: Secondary | ICD-10-CM | POA: Diagnosis not present

## 2013-09-21 DIAGNOSIS — R918 Other nonspecific abnormal finding of lung field: Secondary | ICD-10-CM | POA: Insufficient documentation

## 2013-09-21 LAB — POCT CBC
Granulocyte percent: 56.4 %G (ref 37–80)
HCT, POC: 37.7 % (ref 37.7–47.9)
Hemoglobin: 12.6 g/dL (ref 12.2–16.2)
Lymph, poc: 1.9 (ref 0.6–3.4)
MCH, POC: 31.3 pg — AB (ref 27–31.2)
MCHC: 33.6 g/dL (ref 31.8–35.4)
MCV: 93.4 fL (ref 80–97)
MPV: 8.7 fL (ref 0–99.8)
PLATELET COUNT, POC: 180 10*3/uL (ref 142–424)
POC Granulocyte: 2.9 (ref 2–6.9)
POC LYMPH %: 37.1 % (ref 10–50)
RBC: 4 M/uL — AB (ref 4.04–5.48)
RDW, POC: 13.9 %
WBC: 5.1 10*3/uL (ref 4.6–10.2)

## 2013-09-21 LAB — POCT GLYCOSYLATED HEMOGLOBIN (HGB A1C): Hemoglobin A1C: 5.9

## 2013-09-21 NOTE — Addendum Note (Signed)
Addended by: Zannie Cove on: 09/21/2013 12:30 PM   Modules accepted: Orders

## 2013-09-21 NOTE — Progress Notes (Signed)
Subjective:    Patient ID: Misty Smith, female    DOB: 16-Feb-1941, 73 y.o.   MRN: [id number]  HPI Pt here for follow up and management of chronic medical problems.        Patient Active Problem List   Diagnosis Date Noted  . SYNCOPE 02/02/2009  . DM 02/01/2009  . HYPERLIPIDEMIA 02/01/2009  . HYPERTENSION 02/01/2009  . DIZZINESS 02/01/2009  . PALPITATIONS 02/01/2009   Outpatient Encounter Prescriptions as of 09/21/2013  Medication Sig  . ALPRAZolam (XANAX) 0.25 MG tablet Take 0.25 mg by mouth at bedtime as needed.    Marland Kitchen atorvastatin (LIPITOR) 40 MG tablet Take 40 mg by mouth daily.    . Calcium Carb-Vit D-Soy Isoflav (CALTRATE 600 + SOY PO) Take by mouth.    . cholecalciferol (VITAMIN D) 1000 UNITS tablet Take 2,000 Units by mouth daily.  Marland Kitchen escitalopram (LEXAPRO) 20 MG tablet TAKE 1 TABLET EVERY DAY AS DIRECTED  . esomeprazole (NEXIUM) 40 MG capsule Take 40 mg by mouth daily before breakfast.    . estradiol (ESTRACE VAGINAL) 0.1 MG/GM vaginal cream Place 2 g vaginally daily. For 3 weeks     . lisinopril (PRINIVIL,ZESTRIL) 20 MG tablet Take 1 tablet (20 mg total) by mouth daily.  Marland Kitchen LOVAZA 1 G capsule TAKE 2 CAPSULES BY MOUTH TWICE A DAY  . meloxicam (MOBIC) 15 MG tablet Take 15 mg by mouth as needed.   . metoprolol tartrate (LOPRESSOR) 25 MG tablet TAKE ONE TABLET ($RemoveBef'25MG'[id number]$ ) BY MOUTH IN THE AM AND TWO TABLETS ($RemoveBefo'50MG'sXiuRYmDFdX$ ) BY MOUTH IN THE PM.  . Multiple Vitamin (MULTIVITAMIN) tablet Take 1 tablet by mouth daily.    . pioglitazone (ACTOS) 30 MG tablet Take 1 tablet (30 mg total) by mouth daily.  . [DISCONTINUED] metoprolol tartrate (LOPRESSOR) 25 MG tablet 25 mg 2 (two) times daily. One tablet twice daily  . [DISCONTINUED] clobetasol (TEMOVATE) 0.05 % external solution   . [DISCONTINUED] CORDRAN 4 MCG/SQCM TAPE     Review of Systems  Constitutional: Negative.   HENT: Negative.   Eyes: Negative.   Respiratory: Negative.   Cardiovascular: Negative.   Gastrointestinal: Negative.     Endocrine: Negative.   Genitourinary: Negative.   Musculoskeletal: Negative.   Skin: Negative.   Allergic/Immunologic: Negative.   Neurological: Negative.   Hematological: Negative.   Psychiatric/Behavioral: Negative.        Objective:   Physical Exam  Nursing note and vitals reviewed. Constitutional: She is oriented to person, place, and time. She appears well-developed and well-nourished. No distress.  HENT:  Head: Normocephalic and atraumatic.  Right Ear: External ear normal.  Left Ear: External ear normal.  Mouth/Throat: Oropharynx is clear and moist.  Nasal congestion bilaterally  Eyes: Conjunctivae and EOM are normal. Pupils are equal, round, and reactive to light. Right eye exhibits no discharge. Left eye exhibits no discharge. No scleral icterus.  Neck: Normal range of motion. Neck supple. No JVD present. No thyromegaly present.  There is no definite thyroid enlargement or thyroid nodules.  Cardiovascular: Normal rate, regular rhythm, normal heart sounds and intact distal pulses.  Exam reveals no gallop and no friction rub.   No murmur heard. At 72 per minute  Pulmonary/Chest: Effort normal and breath sounds normal. No respiratory distress. She has no wheezes. She has no rales. She exhibits no tenderness.  Abdominal: Soft. Bowel sounds are normal. She exhibits no mass. There is tenderness (generalized upper abdominal tenderness especially the right upper quadrant area). There is no rebound and  no guarding.  Musculoskeletal: Normal range of motion. She exhibits no edema and no tenderness.  Lymphadenopathy:    She has no cervical adenopathy.  Neurological: She is alert and oriented to person, place, and time. She has normal reflexes. No cranial nerve deficit.  Skin: Skin is warm and dry.  Psychiatric: She has a normal mood and affect. Her behavior is normal. Judgment and thought content normal.   BP 153/71  Pulse 54  Temp(Src) 97.1 F (36.2 C) (Oral)  Ht 5' (1.524 m)   Wt 176 lb (79.833 kg)  BMI 34.37 kg/m2        Assessment & Plan:  1. DM - POCT CBC - POCT glycosylated hemoglobin (Hb A1C)  2. HYPERLIPIDEMIA - POCT CBC - NMR, lipoprofile  3. HYPERTENSION - POCT CBC - BMP8+EGFR - Hepatic function panel  4. Vitamin D deficiency - Vit D  25 hydroxy (rtn osteoporosis monitoring) - DG Bone Density; Future  5. Postmenopausal - DG Bone Density; Future  6. Generalized anxiety disorder  7. GERD (gastroesophageal reflux disease)  8. Pulmonary nodule seen on imaging study  9. Metabolic syndrome   Patient Instructions  Continue current medications. Continue good therapeutic lifestyle changes which include good diet and exercise. Fall precautions discussed with patient. Schedule your flu vaccine if you haven't had it yet If you are over 62 years old - you may need Prevnar 57 or the adult Pneumonia vaccine. Continue medications as doing Bring blood pressure readings for review in 2-3 weeks Try to get in more walking. Do not forget to get your DEXA scan done. Remember that the repeat CT scan of the lung will be done in April.   Arrie Senate MD

## 2013-09-21 NOTE — Patient Instructions (Addendum)
Continue current medications. Continue good therapeutic lifestyle changes which include good diet and exercise. Fall precautions discussed with patient. Schedule your flu vaccine if you haven't had it yet If you are over 75 years old - you may need Prevnar 41 or the adult Pneumonia vaccine. Continue medications as doing Bring blood pressure readings for review in 2-3 weeks Try to get in more walking. Do not forget to get your DEXA scan done. Remember that the repeat CT scan of the lung will be done in April.

## 2013-09-22 LAB — NMR, LIPOPROFILE
Cholesterol: 144 mg/dL (ref <200)
HDL Cholesterol by NMR: 48 mg/dL (ref >=40)
HDL Particle Number: 32.7 umol/L (ref >=30.5)
LDL Particle Number: 1057 nmol/L — ABNORMAL HIGH (ref <1000)
LDL Size: 21.4 nm (ref >20.5)
LDLC SERPL CALC-MCNC: 73 mg/dL (ref <100)
LP-IR Score: 39 (ref <=45)
SMALL LDL PARTICLE NUMBER: 449 nmol/L (ref <=527)
Triglycerides by NMR: 116 mg/dL (ref <150)

## 2013-09-22 LAB — BMP8+EGFR
BUN / CREAT RATIO: 16 (ref 11–26)
BUN: 12 mg/dL (ref 8–27)
CHLORIDE: 99 mmol/L (ref 97–108)
CO2: 26 mmol/L (ref 18–29)
Calcium: 9.8 mg/dL (ref 8.7–10.3)
Creatinine, Ser: 0.73 mg/dL (ref 0.57–1.00)
GFR calc non Af Amer: 81 mL/min/{1.73_m2} (ref >59)
GFR, EST AFRICAN AMERICAN: 94 mL/min/{1.73_m2} (ref >59)
Glucose: 106 mg/dL — ABNORMAL HIGH (ref 65–99)
Potassium: 5.4 mmol/L — ABNORMAL HIGH (ref 3.5–5.2)
SODIUM: 140 mmol/L (ref 134–144)

## 2013-09-22 LAB — HEPATIC FUNCTION PANEL
ALT: 44 IU/L — AB (ref 0–32)
AST: 47 IU/L — AB (ref 0–40)
Albumin: 4.2 g/dL (ref 3.5–4.8)
Alkaline Phosphatase: 75 IU/L (ref 39–117)
Bilirubin, Direct: 0.13 mg/dL (ref 0.00–0.40)
Total Bilirubin: 0.5 mg/dL (ref 0.0–1.2)
Total Protein: 6.4 g/dL (ref 6.0–8.5)

## 2013-09-22 LAB — VITAMIN D 25 HYDROXY (VIT D DEFICIENCY, FRACTURES): VIT D 25 HYDROXY: 49 ng/mL (ref 30.0–100.0)

## 2013-09-26 ENCOUNTER — Other Ambulatory Visit: Payer: Medicare Other

## 2013-09-26 DIAGNOSIS — Z1212 Encounter for screening for malignant neoplasm of rectum: Secondary | ICD-10-CM

## 2013-09-26 NOTE — Progress Notes (Signed)
Pt came in for labs only 

## 2013-09-27 LAB — FECAL OCCULT BLOOD, IMMUNOCHEMICAL: FECAL OCCULT BLD: NEGATIVE

## 2013-09-27 LAB — SPECIMEN STATUS REPORT

## 2013-10-04 DIAGNOSIS — N3946 Mixed incontinence: Secondary | ICD-10-CM | POA: Diagnosis not present

## 2013-10-04 DIAGNOSIS — N952 Postmenopausal atrophic vaginitis: Secondary | ICD-10-CM | POA: Diagnosis not present

## 2013-10-04 DIAGNOSIS — N3020 Other chronic cystitis without hematuria: Secondary | ICD-10-CM | POA: Diagnosis not present

## 2013-10-05 ENCOUNTER — Other Ambulatory Visit: Payer: Self-pay | Admitting: Family Medicine

## 2013-10-13 ENCOUNTER — Other Ambulatory Visit (INDEPENDENT_AMBULATORY_CARE_PROVIDER_SITE_OTHER): Payer: Medicare Other

## 2013-10-13 DIAGNOSIS — R7989 Other specified abnormal findings of blood chemistry: Secondary | ICD-10-CM | POA: Diagnosis not present

## 2013-10-13 NOTE — Progress Notes (Signed)
Pt came in for labs only 

## 2013-10-14 LAB — BMP8+EGFR
BUN/Creatinine Ratio: 19 (ref 11–26)
BUN: 14 mg/dL (ref 8–27)
CALCIUM: 9.9 mg/dL (ref 8.7–10.3)
CO2: 23 mmol/L (ref 18–29)
CREATININE: 0.74 mg/dL (ref 0.57–1.00)
Chloride: 97 mmol/L (ref 97–108)
GFR calc Af Amer: 92 mL/min/{1.73_m2} (ref >59)
GFR calc non Af Amer: 80 mL/min/{1.73_m2} (ref >59)
GLUCOSE: 150 mg/dL — AB (ref 65–99)
Potassium: 4.4 mmol/L (ref 3.5–5.2)
Sodium: 138 mmol/L (ref 134–144)

## 2013-10-18 ENCOUNTER — Telehealth: Payer: Self-pay | Admitting: *Deleted

## 2013-10-18 NOTE — Telephone Encounter (Signed)
Pt notified of results Verbalizes understanding 

## 2013-10-18 NOTE — Telephone Encounter (Signed)
Message copied by Marin Olp on Tue Oct 18, 2013  4:38 PM ------      Message from: Chipper Herb      Created: Fri Oct 14, 2013  1:42 PM       The blood sugar was elevated at 150. The creatinine was good. The electrolytes including the potassium were all normal ------

## 2013-10-21 DIAGNOSIS — M76899 Other specified enthesopathies of unspecified lower limb, excluding foot: Secondary | ICD-10-CM | POA: Diagnosis not present

## 2013-10-25 ENCOUNTER — Other Ambulatory Visit: Payer: Self-pay | Admitting: Family Medicine

## 2013-10-26 ENCOUNTER — Encounter: Payer: Self-pay | Admitting: Pharmacist

## 2013-10-26 ENCOUNTER — Ambulatory Visit (INDEPENDENT_AMBULATORY_CARE_PROVIDER_SITE_OTHER): Payer: Medicare Other | Admitting: Pharmacist

## 2013-10-26 ENCOUNTER — Ambulatory Visit (INDEPENDENT_AMBULATORY_CARE_PROVIDER_SITE_OTHER): Payer: 59

## 2013-10-26 VITALS — Ht 61.0 in | Wt 178.0 lb

## 2013-10-26 DIAGNOSIS — E559 Vitamin D deficiency, unspecified: Secondary | ICD-10-CM

## 2013-10-26 DIAGNOSIS — M949 Disorder of cartilage, unspecified: Secondary | ICD-10-CM

## 2013-10-26 DIAGNOSIS — Z780 Asymptomatic menopausal state: Secondary | ICD-10-CM

## 2013-10-26 DIAGNOSIS — M899 Disorder of bone, unspecified: Secondary | ICD-10-CM | POA: Diagnosis not present

## 2013-10-26 DIAGNOSIS — M8580 Other specified disorders of bone density and structure, unspecified site: Secondary | ICD-10-CM

## 2013-10-26 NOTE — Patient Instructions (Signed)

## 2013-10-26 NOTE — Progress Notes (Signed)
Patient ID: ANTHONY ROLAND, female   DOB: 08-02-1941, 73 y.o.   MRN: [id number]  [facility] Current Height: Height: 5\' 1"  (154.9 cm)      Max Lifetime Height:  5\' 2"  Current Weight: Weight: 178 lb (80.74 kg)       Ethnicity:Caucasian    HPI: Does pt already have a diagnosis of:  Osteopenia?  Yes Osteoporosis?  No  Back Pain?  Yes       Kyphosis?  No Prior fracture?  Yes - ribs and elbow Current Medications for Osteoporosis / Osteopenia:  No Med(s) previously tried for Osteoporosis/Osteopenia:  Actonel taken for 6 years - discontinued due to length of therapy.  PMH: Age at menopause:  73 yo Hysterectomy?  Yes Oophorectomy?  No HRT? Yes - Former.  Type/duration: 5 years Steroid Use?  No Thyroid med?  No History of cancer?  No History of digestive disorders (ie Crohn's)?  Yes - on PPI Current or previous eating disorders?  No Last Vitamin D Result:  49 (09/21/2013) Last GFR Result:  81 (09/21/2013)   FH/SH: Family history of osteoporosis?  Yes - sister Parent with history of hip fracture?  No Family history of breast cancer?  Yes - sister Exercise?  Yes - walking but less with colder weather Smoking?  No Alcohol?  No    Calcium Assessment Calcium Intake  # of servings/day  Calcium mg  Milk (8 oz) 1  x  300  = 300mg   Yogurt (4 oz) 0 x  200 = 0  Cheese (1 oz) 0 x  200 = 0  Other Calcium sources   250mg   Ca supplement MVI and Calcium 600mg  daily = 1000mg    Estimated calcium intake per day 1550mg     DEXA Results Date of Test T-Score for AP Spine L1-L4 T-Score for Total Left Hip T-Score for Total Right Hip  10/26/2013 -0.7 0.0 -0.2  06/18/2011 -1.0 0.0 -0.2  05/09/2009 -0.7 -0.2 -0.3  04/26/2007 -1.2 -0.6 -0.5  03/17/2005 -1.3 -0.6 --  02/13/2003 -1.0 -0.6 --   Assessment: Osteopenia with stable BMD with 2 years off bisphosphonate therapy  Recommendations: 1.  Discussed BMD results and fracture risk 2.  continue calcium 1200mg  daily through  supplementation or diet. Change MVI to pm and take Calcium in am 3.  recommend weight bearing exercise - 30 minutes at least 4 days per week.   4.  Counseled and educated about fall risk and prevention.  Recheck DEXA:  2 years  Time spent counseling patient:  15 minutes  Cherre Robins, PharmD, CPP

## 2013-10-28 DIAGNOSIS — M545 Low back pain, unspecified: Secondary | ICD-10-CM | POA: Diagnosis not present

## 2013-11-14 DIAGNOSIS — M545 Low back pain, unspecified: Secondary | ICD-10-CM | POA: Diagnosis not present

## 2013-11-24 ENCOUNTER — Encounter: Payer: Self-pay | Admitting: Pharmacist

## 2013-11-24 ENCOUNTER — Ambulatory Visit (INDEPENDENT_AMBULATORY_CARE_PROVIDER_SITE_OTHER): Payer: Medicare Other | Admitting: Pharmacist

## 2013-11-24 VITALS — BP 130/72 | HR 62 | Ht 60.25 in | Wt 177.0 lb

## 2013-11-24 DIAGNOSIS — Z0000 Encounter for general adult medical examination without abnormal findings: Secondary | ICD-10-CM | POA: Diagnosis not present

## 2013-11-24 DIAGNOSIS — L932 Other local lupus erythematosus: Secondary | ICD-10-CM

## 2013-11-24 NOTE — Patient Instructions (Addendum)
Bring in Advanced Directives or copy of Living Will.   Health Maintenance Summary    HEMOGLOBIN A1C Next Due 03/21/2014  last was 5.8% (09/2013)    INFLUENZA VACCINE Next Due 04/01/2014  Last was 06/08/2013    URINE MICROALBUMIN Next Due 04/11/2014      OPHTHALMOLOGY EXAM Next Due 08/03/2014  last 08/2013    MAMMOGRAM Next Due 10/06/2014  last 10/06/2013    FOOT EXAM Next Due 09/21/2014      COLONOSCOPY Next Due 05/02/2016  last was 05/02/2006   DEXA / bone density Next Due  10/2015 Last was 10/2013   Pneumonia Completed  Last was 09/21/2013   Shingles / zoster completed  Last was 12/01/2006    TETANUS/TDAP Next Due 06/01/2021 Last was 06/02/2011     Preventive Care for Adults, Female A healthy lifestyle and preventive care can promote health and wellness. Preventive health guidelines for women include the following key practices.  A routine yearly physical is a good way to check with your health care provider about your health and preventive screening. It is a chance to share any concerns and updates on your health and to receive a thorough exam.  Visit your dentist for a routine exam and preventive care every 6 months. Brush your teeth twice a day and floss once a day. Good oral hygiene prevents tooth decay and gum disease.  The frequency of eye exams is based on your age, health, family medical history, use of contact lenses, and other factors. Follow your health care provider's recommendations for frequency of eye exams.  Eat a healthy diet. Foods like vegetables, fruits, whole grains, low-fat dairy products, and lean protein foods contain the nutrients you need without too many calories. Decrease your intake of foods high in solid fats, added sugars, and salt. Eat the right amount of calories for you.Get information about a proper diet from your health care provider, if necessary.  Regular physical exercise is one of the most important things you can do for your health. Most adults should  get at least 150 minutes of moderate-intensity exercise (any activity that increases your heart rate and causes you to sweat) each week. In addition, most adults need muscle-strengthening exercises on 2 or more days a week.  Maintain a healthy weight. The body mass index (BMI) is a screening tool to identify possible weight problems. It provides an estimate of body fat based on height and weight. Your health care provider can find your BMI, and can help you achieve or maintain a healthy weight.For adults 20 years and older:  A BMI below 18.5 is considered underweight.  A BMI of 18.5 to 24.9 is normal.  A BMI of 25 to 29.9 is considered overweight.  A BMI of 30 and above is considered obese.  Maintain normal blood lipids and cholesterol levels by exercising and minimizing your intake of saturated fat. Eat a balanced diet with plenty of fruit and vegetables. Blood tests for lipids and cholesterol should begin at age 20 and be repeated every 5 years. If your lipid or cholesterol levels are high, you are over 50, or you are at high risk for heart disease, you may need your cholesterol levels checked more frequently.Ongoing high lipid and cholesterol levels should be treated with medicines if diet and exercise are not working.  If you smoke, find out from your health care provider how to quit. If you do not use tobacco, do not start.  Lung cancer screening is recommended for adults   aged 55 80 years who are at high risk for developing lung cancer because of a history of smoking. A yearly low-dose CT scan of the lungs is recommended for people who have at least a 30-pack-year history of smoking and are a current smoker or have quit within the past 15 years. A pack year of smoking is smoking an average of 1 pack of cigarettes a day for 1 year (for example: 1 pack a day for 30 years or 2 packs a day for 15 years). Yearly screening should continue until the smoker has stopped smoking for at least 15 years.  Yearly screening should be stopped for people who develop a health problem that would prevent them from having lung cancer treatment.  If you are pregnant, do not drink alcohol. If you are breastfeeding, be very cautious about drinking alcohol. If you are not pregnant and choose to drink alcohol, do not have more than 1 drink per day. One drink is considered to be 12 ounces (355 mL) of beer, 5 ounces (148 mL) of wine, or 1.5 ounces (44 mL) of liquor.  Avoid use of street drugs. Do not share needles with anyone. Ask for help if you need support or instructions about stopping the use of drugs.  High blood pressure causes heart disease and increases the risk of stroke. Your blood pressure should be checked at least every 1 to 2 years. Ongoing high blood pressure should be treated with medicines if weight loss and exercise do not work.  If you are 55 75 years old, ask your health care provider if you should take aspirin to prevent strokes.  Diabetes screening involves taking a blood sample to check your fasting blood sugar level. This should be done once every 3 years, after age 45, if you are within normal weight and without risk factors for diabetes. Testing should be considered at a younger age or be carried out more frequently if you are overweight and have at least 1 risk factor for diabetes.  Breast cancer screening is essential preventive care for women. You should practice "breast self-awareness." This means understanding the normal appearance and feel of your breasts and may include breast self-examination. Any changes detected, no matter how small, should be reported to a health care provider. Women in their 20s and 30s should have a clinical breast exam (CBE) by a health care provider as part of a regular health exam every 1 to 3 years. After age 40, women should have a CBE every year. Starting at age 40, women should consider having a mammogram (breast X-ray test) every year. Women who have a  family history of breast cancer should talk to their health care provider about genetic screening. Women at a high risk of breast cancer should talk to their health care providers about having an MRI and a mammogram every year.  Breast cancer gene (BRCA)-related cancer risk assessment is recommended for women who have family members with BRCA-related cancers. BRCA-related cancers include breast, ovarian, tubal, and peritoneal cancers. Having family members with these cancers may be associated with an increased risk for harmful changes (mutations) in the breast cancer genes BRCA1 and BRCA2. Results of the assessment will determine the need for genetic counseling and BRCA1 and BRCA2 testing.  The Pap test is a screening test for cervical cancer. A Pap test can show cell changes on the cervix that might become cervical cancer if left untreated. A Pap test is a procedure in which cells are obtained and   examined from the lower end of the uterus (cervix).  Women should have a Pap test starting at age 21.  Between ages 21 and 29, Pap tests should be repeated every 2 years.  Beginning at age 30, you should have a Pap test every 3 years as long as the past 3 Pap tests have been normal.  Some women have medical problems that increase the chance of getting cervical cancer. Talk to your health care provider about these problems. It is especially important to talk to your health care provider if a new problem develops soon after your last Pap test. In these cases, your health care provider may recommend more frequent screening and Pap tests.  The above recommendations are the same for women who have or have not gotten the vaccine for human papillomavirus (HPV).  If you had a hysterectomy for a problem that was not cancer or a condition that could lead to cancer, then you no longer need Pap tests. Even if you no longer need a Pap test, a regular exam is a good idea to make sure no other problems are  starting.  If you are between ages 65 and 70 years, and you have had normal Pap tests going back 10 years, you no longer need Pap tests. Even if you no longer need a Pap test, a regular exam is a good idea to make sure no other problems are starting.  If you have had past treatment for cervical cancer or a condition that could lead to cancer, you need Pap tests and screening for cancer for at least 20 years after your treatment.  If Pap tests have been discontinued, risk factors (such as a new sexual partner) need to be reassessed to determine if screening should be resumed.  The HPV test is an additional test that may be used for cervical cancer screening. The HPV test looks for the virus that can cause the cell changes on the cervix. The cells collected during the Pap test can be tested for HPV. The HPV test could be used to screen women aged 30 years and older, and should be used in women of any age who have unclear Pap test results. After the age of 30, women should have HPV testing at the same frequency as a Pap test.  Colorectal cancer can be detected and often prevented. Most routine colorectal cancer screening begins at the age of 50 years and continues through age 75 years. However, your health care provider may recommend screening at an earlier age if you have risk factors for colon cancer. On a yearly basis, your health care provider may provide home test kits to check for hidden blood in the stool. Use of a small camera at the end of a tube, to directly examine the colon (sigmoidoscopy or colonoscopy), can detect the earliest forms of colorectal cancer. Talk to your health care provider about this at age 50, when routine screening begins. Direct exam of the colon should be repeated every 5 10 years through age 75 years, unless early forms of pre-cancerous polyps or small growths are found.  People who are at an increased risk for hepatitis B should be screened for this virus. You are  considered at high risk for hepatitis B if:  You were born in a country where hepatitis B occurs often. Talk with your health care provider about which countries are considered high risk.  Your parents were born in a high-risk country and you have not received a   shot to protect against hepatitis B (hepatitis B vaccine).  You have HIV or AIDS.  You use needles to inject street drugs.  You live with, or have sex with, someone who has Hepatitis B.  You get hemodialysis treatment.  You take certain medicines for conditions like cancer, organ transplantation, and autoimmune conditions.  Hepatitis C blood testing is recommended for all people born from [birth year] through 1967 and any individual with known risks for hepatitis C.  Practice safe sex. Use condoms and avoid high-risk sexual practices to reduce the spread of sexually transmitted infections (STIs). STIs include gonorrhea, chlamydia, syphilis, trichomonas, herpes, HPV, and human immunodeficiency virus (HIV). Herpes, HIV, and HPV are viral illnesses that have no cure. They can result in disability, cancer, and death. Sexually active women aged 55 years and younger should be checked for chlamydia. Older women with new or multiple partners should also be tested for chlamydia. Testing for other STIs is recommended if you are sexually active and at increased risk.  Osteoporosis is a disease in which the bones lose minerals and strength with aging. This can result in serious bone fractures or breaks. The risk of osteoporosis can be identified using a bone density scan. Women ages 41 years and over and women at risk for fractures or osteoporosis should discuss screening with their health care providers. Ask your health care provider whether you should take a calcium supplement or vitamin D to reduce the rate of osteoporosis.  Menopause can be associated with physical symptoms and risks. Hormone replacement therapy is available to decrease symptoms and  risks. You should talk to your health care provider about whether hormone replacement therapy is right for you.  Use sunscreen. Apply sunscreen liberally and repeatedly throughout the day. You should seek shade when your shadow is shorter than you. Protect yourself by wearing long sleeves, pants, a wide-brimmed hat, and sunglasses year round, whenever you are outdoors.  Once a month, do a whole body skin exam, using a mirror to look at the skin on your back. Tell your health care provider of new moles, moles that have irregular borders, moles that are larger than a pencil eraser, or moles that have changed in shape or color.  Stay current with required vaccines (immunizations).  Influenza vaccine. All adults should be immunized every year.  Tetanus, diphtheria, and acellular pertussis (Td, Tdap) vaccine. Pregnant women should receive 1 dose of Tdap vaccine during each pregnancy. The dose should be obtained regardless of the length of time since the last dose. Immunization is preferred during the 27th 36th week of gestation. An adult who has not previously received Tdap or who does not know her vaccine status should receive 1 dose of Tdap. This initial dose should be followed by tetanus and diphtheria toxoids (Td) booster doses every 10 years. Adults with an unknown or incomplete history of completing a 3-dose immunization series with Td-containing vaccines should begin or complete a primary immunization series including a Tdap dose. Adults should receive a Td booster every 10 years.  Varicella vaccine. An adult without evidence of immunity to varicella should receive 2 doses or a second dose if she has previously received 1 dose. Pregnant females who do not have evidence of immunity should receive the first dose after pregnancy. This first dose should be obtained before leaving the health care facility. The second dose should be obtained 4 8 weeks after the first dose.  Human papillomavirus (HPV)  vaccine. Females aged 29 26 years who have not  received the vaccine previously should obtain the 3-dose series. The vaccine is not recommended for use in pregnant females. However, pregnancy testing is not needed before receiving a dose. If a female is found to be pregnant after receiving a dose, no treatment is needed. In that case, the remaining doses should be delayed until after the pregnancy. Immunization is recommended for any person with an immunocompromised condition through the age of 26 years if she did not get any or all doses earlier. During the 3-dose series, the second dose should be obtained 4 8 weeks after the first dose. The third dose should be obtained 24 weeks after the first dose and 16 weeks after the second dose.  Zoster vaccine. One dose is recommended for adults aged 60 years or older unless certain conditions are present.  Measles, mumps, and rubella (MMR) vaccine. Adults born before 1957 generally are considered immune to measles and mumps. Adults born in 1957 or later should have 1 or more doses of MMR vaccine unless there is a contraindication to the vaccine or there is laboratory evidence of immunity to each of the three diseases. A routine second dose of MMR vaccine should be obtained at least 28 days after the first dose for students attending postsecondary schools, health care workers, or international travelers. People who received inactivated measles vaccine or an unknown type of measles vaccine during 1963 1967 should receive 2 doses of MMR vaccine. People who received inactivated mumps vaccine or an unknown type of mumps vaccine before 1979 and are at high risk for mumps infection should consider immunization with 2 doses of MMR vaccine. For females of childbearing age, rubella immunity should be determined. If there is no evidence of immunity, females who are not pregnant should be vaccinated. If there is no evidence of immunity, females who are pregnant should delay  immunization until after pregnancy. Unvaccinated health care workers born before 1957 who lack laboratory evidence of measles, mumps, or rubella immunity or laboratory confirmation of disease should consider measles and mumps immunization with 2 doses of MMR vaccine or rubella immunization with 1 dose of MMR vaccine.  Pneumococcal 13-valent conjugate (PCV13) vaccine. When indicated, a person who is uncertain of her immunization history and has no record of immunization should receive the PCV13 vaccine. An adult aged 19 years or older who has certain medical conditions and has not been previously immunized should receive 1 dose of PCV13 vaccine. This PCV13 should be followed with a dose of pneumococcal polysaccharide (PPSV23) vaccine. The PPSV23 vaccine dose should be obtained at least 8 weeks after the dose of PCV13 vaccine. An adult aged 19 years or older who has certain medical conditions and previously received 1 or more doses of PPSV23 vaccine should receive 1 dose of PCV13. The PCV13 vaccine dose should be obtained 1 or more years after the last PPSV23 vaccine dose.  Pneumococcal polysaccharide (PPSV23) vaccine. When PCV13 is also indicated, PCV13 should be obtained first. All adults aged 65 years and older should be immunized. An adult younger than age 65 years who has certain medical conditions should be immunized. Any person who resides in a nursing home or long-term care facility should be immunized. An adult smoker should be immunized. People with an immunocompromised condition and certain other conditions should receive both PCV13 and PPSV23 vaccines. People with human immunodeficiency virus (HIV) infection should be immunized as soon as possible after diagnosis. Immunization during chemotherapy or radiation therapy should be avoided. Routine use of PPSV23 vaccine is   not recommended for American Indians, [place] Natives, or people younger than 71 years unless there are medical conditions that require  PPSV23 vaccine. When indicated, people who have unknown immunization and have no record of immunization should receive PPSV23 vaccine. One-time revaccination 5 years after the first dose of PPSV23 is recommended for people aged 70 64 years who have chronic kidney failure, nephrotic syndrome, asplenia, or immunocompromised conditions. People who received 1 2 doses of PPSV23 before age 60 years should receive another dose of PPSV23 vaccine at age 37 years or later if at least 5 years have passed since the previous dose. Doses of PPSV23 are not needed for people immunized with PPSV23 at or after age 34 years.  Meningococcal vaccine. Adults with asplenia or persistent complement component deficiencies should receive 2 doses of quadrivalent meningococcal conjugate (MenACWY-D) vaccine. The doses should be obtained at least 2 months apart. Microbiologists working with certain meningococcal bacteria, [place] recruits, people at risk during an outbreak, and people who travel to or live in countries with a high rate of meningitis should be immunized. A first-year college student up through age 10 years who is living in a residence hall should receive a dose if she did not receive a dose on or after her 16th birthday. Adults who have certain high-risk conditions should receive one or more doses of vaccine.  Hepatitis A vaccine. Adults who wish to be protected from this disease, have certain high-risk conditions, work with hepatitis A-infected animals, work in hepatitis A research labs, or travel to or work in countries with a high rate of hepatitis A should be immunized. Adults who were previously unvaccinated and who anticipate close contact with an international adoptee during the first 60 days after arrival in the [place] from a country with a high rate of hepatitis A should be immunized.  Hepatitis B vaccine. Adults who wish to be protected from this disease, have certain high-risk conditions, may be exposed  to blood or other infectious body fluids, are household contacts or sex partners of hepatitis B positive people, are clients or workers in certain care facilities, or travel to or work in countries with a high rate of hepatitis B should be immunized.  Haemophilus influenzae type b (Hib) vaccine. A previously unvaccinated person with asplenia or sickle cell disease or having a scheduled splenectomy should receive 1 dose of Hib vaccine. Regardless of previous immunization, a recipient of a hematopoietic stem cell transplant should receive a 3-dose series 6 12 months after her successful transplant. Hib vaccine is not recommended for adults with HIV infection.

## 2013-11-24 NOTE — Progress Notes (Signed)
Subjective:    Misty Smith is a 73 y.o. female who presents for Medicare Initial preventive examination.  Preventive Screening-Counseling & Management  Tobacco History  Smoking status  . Never Smoker   Smokeless tobacco  . Never Used     Current Problems (verified) Patient Active Problem List   Diagnosis Date Noted  . Osteopenia of the elderly 10/26/2013  . Generalized anxiety disorder 09/21/2013  . GERD (gastroesophageal reflux disease) 09/21/2013  . Pulmonary nodule seen on imaging study 09/21/2013  . SYNCOPE 02/02/2009  . Pre-diabetes 02/01/2009  . HYPERLIPIDEMIA 02/01/2009  . HYPERTENSION 02/01/2009  . DIZZINESS 02/01/2009  . PALPITATIONS 02/01/2009    Medications Prior to Visit Current Outpatient Prescriptions on File Prior to Visit  Medication Sig Dispense Refill  . ALPRAZolam (XANAX) 0.25 MG tablet Take 0.25 mg by mouth at bedtime as needed.        Marland Kitchen atorvastatin (LIPITOR) 80 MG tablet TAKE 1 TABLET BY MOUTH DAILY  90 tablet  1  . Calcium Carb-Cholecalciferol (CALTRATE 600+D) 600-800 MG-UNIT TABS Take 1 tablet by mouth every evening.       . cholecalciferol (VITAMIN D) 1000 UNITS tablet Take 2,000 Units by mouth daily.      Marland Kitchen escitalopram (LEXAPRO) 20 MG tablet TAKE 1 TABLET EVERY DAY AS DIRECTED  90 tablet  0  . estradiol (ESTRACE VAGINAL) 0.1 MG/GM vaginal cream Place 2 g vaginally daily. For 3 weeks         . lisinopril (PRINIVIL,ZESTRIL) 20 MG tablet TAKE 1 TABLET (20 MG TOTAL) BY MOUTH DAILY.  90 tablet  1  . LOVAZA 1 G capsule TAKE 2 CAPSULES BY MOUTH TWICE A DAY  360 capsule  1  . meloxicam (MOBIC) 15 MG tablet Take 15 mg by mouth as needed.       . metoprolol tartrate (LOPRESSOR) 25 MG tablet 1bid      . Multiple Vitamin (MULTIVITAMIN) tablet Take 1 tablet by mouth every morning.       Marland Kitchen NEXIUM 40 MG capsule TAKE 1 CAPSULE BY MOUTH ONCE A DAY  90 capsule  0  . pioglitazone (ACTOS) 30 MG tablet TAKE 1 TABLET (30 MG TOTAL) BY MOUTH DAILY.  90 tablet  1    No current facility-administered medications on file prior to visit.    Current Medications (verified) Current Outpatient Prescriptions  Medication Sig Dispense Refill  . ALPRAZolam (XANAX) 0.25 MG tablet Take 0.25 mg by mouth at bedtime as needed.        Marland Kitchen atorvastatin (LIPITOR) 80 MG tablet TAKE 1 TABLET BY MOUTH DAILY  90 tablet  1  . Calcium Carb-Cholecalciferol (CALTRATE 600+D) 600-800 MG-UNIT TABS Take 1 tablet by mouth every evening.       . cholecalciferol (VITAMIN D) 1000 UNITS tablet Take 2,000 Units by mouth daily.      Marland Kitchen escitalopram (LEXAPRO) 20 MG tablet TAKE 1 TABLET EVERY DAY AS DIRECTED  90 tablet  0  . estradiol (ESTRACE VAGINAL) 0.1 MG/GM vaginal cream Place 2 g vaginally daily. For 3 weeks         . lisinopril (PRINIVIL,ZESTRIL) 20 MG tablet TAKE 1 TABLET (20 MG TOTAL) BY MOUTH DAILY.  90 tablet  1  . LOVAZA 1 G capsule TAKE 2 CAPSULES BY MOUTH TWICE A DAY  360 capsule  1  . meloxicam (MOBIC) 15 MG tablet Take 15 mg by mouth as needed.       . metoprolol tartrate (LOPRESSOR) 25 MG tablet 1bid      .  Multiple Vitamin (MULTIVITAMIN) tablet Take 1 tablet by mouth every morning.       Marland Kitchen NEXIUM 40 MG capsule TAKE 1 CAPSULE BY MOUTH ONCE A DAY  90 capsule  0  . pioglitazone (ACTOS) 30 MG tablet TAKE 1 TABLET (30 MG TOTAL) BY MOUTH DAILY.  90 tablet  1   No current facility-administered medications for this visit.     Allergies (verified) Ciprofloxacin; Codeine; Iohexol; Penicillins; Sulfonamide derivatives; Asa; and Keflex   PAST HISTORY  Family History Family History  Problem Relation Age of Onset  . Cancer Mother     laryngeal   . Hypertension Mother   . Heart disease Mother   . Hypertension Father   . Coronary artery disease Father   . Kidney disease Father   . Breast cancer Sister   . Hypertension Sister   . Heart attack Sister   . Heart disease Sister   . Stroke Sister   . Stroke Sister   . Hypertension Brother   . Cancer Brother     liver  .  Hypertension Sister   . Hyperlipidemia Sister   . Hypertension Brother     Social History History  Substance Use Topics  . Smoking status: Never Smoker   . Smokeless tobacco: Never Used  . Alcohol Use: No     Are there smokers in your home (other than you)? No  Risk Factors Current exercise habits: Home exercise routine includes walking 1 hrs per week.  Dietary issues discussed: none   Cardiac risk factors: advanced age (older than 56 for men, 39 for women), dyslipidemia, family history of premature cardiovascular disease, hypertension, obesity (BMI >= 30 kg/m2) and sedentary lifestyle.  Depression Screen (Note: if answer to either of the following is "Yes", a more complete depression screening is indicated)   Over the past 2 weeks, have you felt down, depressed or hopeless? No  Over the past 2 weeks, have you felt little interest or pleasure in doing things? No  Have you lost interest or pleasure in daily life? No  Do you often feel hopeless? No  Do you cry easily over simple problems? No  Activities of Daily Living In your present state of health, do you have any difficulty performing the following activities?:  Driving? No Managing money?  No Feeding yourself? No Getting from bed to chair? No Climbing a flight of stairs? Yes - uses stair lift for basement steps Preparing food and eating?: No Bathing or showering? No Getting dressed: No Getting to the toilet? No Using the toilet:No Moving around from place to place: No In the past year have you fallen or had a near fall?:Yes - occurred when getting off stair lift   Are you sexually active?  No  Do you have more than one partner?  No  Hearing Difficulties: Yes Do you often ask people to speak up or repeat themselves? No Do you experience ringing or noises in your ears? Yes Do you have difficulty understanding soft or whispered voices? Yes   Do you feel that you have a problem with memory? No  Do you often  misplace items? No  Do you feel safe at home?  Yes  Cognitive Testing  Alert? Yes  Normal Appearance?Yes  Oriented to person? Yes  Place? Yes   Time? Yes  Recall of three objects?  Yes  Can perform simple calculations? Yes  Displays appropriate judgment?Yes  Can read the correct time from a watch face?Yes   Advanced Directives  have been discussed with the patient? Yes  List the Names of Other Physician/Practitioners you currently use: 1.  Cardiologist - Dr Kirk Ruths 2.  Orthopedists - Dr. Nelva Bush and Dr Alvan Dame 3.  Dermatologist - Dr Rosalita Chessman / Mardene Speak 4.  Opthamologist - Dr Katy Fitch / Dr Zadie Rhine 5.  ENT - Dr Ernesto Rutherford 6.  Urologist - Dr Jeffie Pollock 7.  GI - Dr Watt Climes  Indicate any recent Medical Services you may have received from other than [facility] providers in the past year (date may be approximate).  Immunization History  Administered Date(s) Administered  . Influenza,inj,Quad PF,36+ Mos 06/08/2013  . Pneumococcal Conjugate-13 09/21/2013    Screening Tests Health Maintenance  Topic Date Due  . Hemoglobin A1c  03/21/2014  . Influenza Vaccine  04/01/2014  . Urine Microalbumin  04/11/2014  . Ophthalmology Exam  08/03/2014  . Mammogram  08/29/2014  . Foot Exam  09/21/2014  . Colonoscopy  05/02/2016  . Tetanus/tdap  06/01/2021  . Pneumococcal Polysaccharide Vaccine Age 85 And Over  Completed  . Zostavax  Completed   Reviewed note sent by Dr Katy Fitch regarding patient's eye exam 08/2013.  Per his note - dilated eye exam was negative for retinopathy in both eyes.   All answers were reviewed with the patient and necessary referrals were made:  Cherre Robins, [facility]   11/24/2013   History reviewed: allergies, current medications, past family history, past medical history, past social history, past surgical history and problem list  Objective:    Body mass index is 34.3 kg/(m^2). BP 130/72  Pulse 62  Ht 5' 0.25" (1.53 m)  Wt 177 lb (80.287 kg)  BMI 34.30 kg/m2   Assessment:      Annual Medicare Wellness Visit      Plan:     During the course of the visit the patient was educated and counseled about appropriate screening and preventive services including:    Pneumococcal vaccine   Influenza vaccine  Hepatitis B vaccine  Td vaccine  Screening mammography  Screening Pap smear and pelvic exam   Bone densitometry screening  Colorectal cancer screening  Glaucoma screening  Nutrition counseling   Advanced directives: has an advanced directive - a copy has been provided  Diet review for nutrition referral? Yes but patient declined referral to nutritionist.  In office nutrition counseling was provided with emphasis on reducing caloric intake by 500 kcal daily, increase non starchy vegetables, increase whole grains and limiting serving sizes, sugar and CHO intake.   Discussed increasing exercise once cleared with Dr Nelva Bush - recommended Silver Sneakers program and Balance classes though community center. Recommended follow up with audiologist - patient declined.   Patient Instructions (the written plan) was given to the patient.  Medicare Attestation I have personally reviewed: The patient's medical and social history Their use of alcohol, tobacco or illicit drugs Their current medications and supplements The patient's functional ability including ADLs,fall risks, home safety risks, cognitive, and hearing and visual impairment Diet and physical activities Evidence for depression or mood disorders  The patient's weight, height, BMI, and BP/HR have been recorded in the chart.  I have made referrals, counseling, and provided education to the patient based on review of the above and I have provided the patient with a written personalized care plan for preventive services.     Cherre Robins, [facility]   11/24/2013

## 2013-12-02 ENCOUNTER — Other Ambulatory Visit: Payer: Self-pay | Admitting: Family Medicine

## 2013-12-06 ENCOUNTER — Other Ambulatory Visit: Payer: Self-pay | Admitting: *Deleted

## 2013-12-06 DIAGNOSIS — R911 Solitary pulmonary nodule: Secondary | ICD-10-CM

## 2013-12-12 ENCOUNTER — Other Ambulatory Visit: Payer: Self-pay | Admitting: Family Medicine

## 2013-12-13 ENCOUNTER — Inpatient Hospital Stay: Admission: RE | Admit: 2013-12-13 | Payer: Medicare Other | Source: Ambulatory Visit

## 2013-12-15 ENCOUNTER — Ambulatory Visit
Admission: RE | Admit: 2013-12-15 | Discharge: 2013-12-15 | Disposition: A | Discharge status: Home / Self Care | Payer: Medicare Other | Source: Ambulatory Visit | Attending: Family Medicine | Admitting: Family Medicine

## 2013-12-15 DIAGNOSIS — R911 Solitary pulmonary nodule: Secondary | ICD-10-CM

## 2013-12-15 DIAGNOSIS — J984 Other disorders of lung: Secondary | ICD-10-CM | POA: Diagnosis not present

## 2013-12-30 ENCOUNTER — Encounter: Payer: Self-pay | Admitting: Cardiology

## 2013-12-30 ENCOUNTER — Ambulatory Visit (INDEPENDENT_AMBULATORY_CARE_PROVIDER_SITE_OTHER): Payer: Medicare Other | Admitting: Cardiology

## 2013-12-30 VITALS — BP 150/82 | HR 55 | Ht 61.0 in | Wt 177.4 lb

## 2013-12-30 DIAGNOSIS — I2510 Atherosclerotic heart disease of native coronary artery without angina pectoris: Secondary | ICD-10-CM

## 2013-12-30 DIAGNOSIS — E785 Hyperlipidemia, unspecified: Secondary | ICD-10-CM

## 2013-12-30 DIAGNOSIS — I10 Essential (primary) hypertension: Secondary | ICD-10-CM

## 2013-12-30 DIAGNOSIS — R002 Palpitations: Secondary | ICD-10-CM

## 2013-12-30 MED ORDER — ASPIRIN EC 81 MG PO TBEC: 81.0000 mg | DELAYED_RELEASE_TABLET | Freq: Every day | ORAL | Status: AC

## 2013-12-30 NOTE — Progress Notes (Signed)
HPI: FU palpitations and hypertension. Seen in the past secondary to hypertension, hyperlipidemia, and palpitations felt secondary to PVCs. She had a syncopal episode in April of 2010 that we felt was most likely vagal. An echocardiogram showed normal LV function. A Myoview showed an ejection fraction of 80% and normal perfusion. These were performed on February 20, 2009. Chest CT 4/15 showed nodule followed by primary care; also coronary calcification. I last saw her in March 2014. Since then, the patient has dyspnea with more extreme activities but not with routine activities. It is relieved with rest. It is not associated with chest pain. There is no orthopnea, PND or pedal edema. There is no syncope or palpitations. There is no exertional chest pain.   Current Outpatient Prescriptions  Medication Sig Dispense Refill  . ALPRAZolam (XANAX) 0.25 MG tablet Take 0.25 mg by mouth at bedtime as needed.        Marland Kitchen atorvastatin (LIPITOR) 80 MG tablet TAKE 1 TABLET BY MOUTH DAILY  90 tablet  1  . Calcium Carb-Cholecalciferol (CALTRATE 600+D) 600-800 MG-UNIT TABS Take 1 tablet by mouth every evening.       . cholecalciferol (VITAMIN D) 1000 UNITS tablet Take 2,000 Units by mouth daily.      . clobetasol (TEMOVATE) 0.05 % external solution Apply 1 application topically 2 (two) times daily.      Marland Kitchen desonide (DESOWEN) 0.05 % cream Apply 1 application topically 2 (two) times daily.      Marland Kitchen escitalopram (LEXAPRO) 20 MG tablet TAKE 1 TABLET EVERY DAY AS DIRECTED  90 tablet  0  . estradiol (ESTRACE VAGINAL) 0.1 MG/GM vaginal cream Place 2 g vaginally daily. For 3 weeks         . Halcinonide (HALOG) 0.1 % CREA Apply 1 application topically 2 (two) times daily.      Marland Kitchen lisinopril (PRINIVIL,ZESTRIL) 20 MG tablet TAKE 1 TABLET (20 MG TOTAL) BY MOUTH DAILY.  90 tablet  1  . LOVAZA 1 G capsule TAKE 2 CAPSULES BY MOUTH TWICE A DAY  360 capsule  1  . meloxicam (MOBIC) 15 MG tablet Take 15 mg by mouth as needed.       .  metoprolol tartrate (LOPRESSOR) 25 MG tablet 1bid      . Multiple Vitamin (MULTIVITAMIN) tablet Take 1 tablet by mouth every morning.       Marland Kitchen NEXIUM 40 MG capsule TAKE 1 CAPSULE BY MOUTH ONCE A DAY  90 capsule  0  . pioglitazone (ACTOS) 30 MG tablet TAKE 1 TABLET (30 MG TOTAL) BY MOUTH DAILY.  90 tablet  1   No current facility-administered medications for this visit.     Past Medical History  Diagnosis Date  . Anxiety   . Stress   . Colovaginal fistula   . Hypertension   . Postmenopausal HRT (hormone replacement therapy)   . Hyperlipidemia   . Perimenopausal vasomotor symptoms   . Esophageal stricture   . Subacute lupus erythematosus     Dr. Sol Blazing    . Pre-diabetes   . Fractured elbow 1970's    Past Surgical History  Procedure Laterality Date  . Abdominal bypass  1987    fibroids   . Bilateral tubal ligtation  1972  . Tonsillectomy    . Abdominal hysterectomy      partial  . Elbow Right     surgery after fracture of elbow    History   Social History  . Marital Status: Married  Spouse Name: Milbert Coulter    Number of Children: N/A  . Years of Education: N/A   Occupational History  . Not on file.   Social History Main Topics  . Smoking status: Never Smoker   . Smokeless tobacco: Never Used  . Alcohol Use: No  . Drug Use: No  . Sexual Activity: No   Other Topics Concern  . Not on file   Social History Narrative  . No narrative on file    ROS: no fevers or chills, productive cough, hemoptysis, dysphasia, odynophagia, melena, hematochezia, dysuria, hematuria, rash, seizure activity, orthopnea, PND, pedal edema, claudication. Remaining systems are negative.  Physical Exam: Well-developed obese in no acute distress.  Skin is warm and dry.  HEENT is normal.  Neck is supple.  Chest is clear to auscultation with normal expansion.  Cardiovascular exam is regular rate and rhythm.  Abdominal exam nontender or distended. No masses palpated. Extremities show trace  edema. neuro grossly intact  ECG Sinus bradycardia at a rate of 55. No ST changes.

## 2013-12-30 NOTE — Assessment & Plan Note (Signed)
Continue statin. Lipids and liver monitored by primary care. 

## 2013-12-30 NOTE — Assessment & Plan Note (Signed)
Blood pressure mildly elevated. However she follows this at home and it is typically controlled. Continue present medications.

## 2013-12-30 NOTE — Assessment & Plan Note (Signed)
Controlled. Continue beta blocker. 

## 2013-12-30 NOTE — Patient Instructions (Signed)
Your physician wants you to follow-up in: ONE YEAR WITH DR CRENSHAW You will receive a reminder letter in the mail two months in advance. If you don't receive a letter, please call our office to schedule the follow-up appointment.   START ASPIRIN 81 MG ONCE DAILY 

## 2013-12-30 NOTE — Assessment & Plan Note (Signed)
Coronary calcium noted on CT scan. Continue statin. Add aspirin 81 mg daily. Previous nuclear study showed no ischemia. No chest pain.

## 2014-01-05 ENCOUNTER — Other Ambulatory Visit (INDEPENDENT_AMBULATORY_CARE_PROVIDER_SITE_OTHER): Payer: Medicare Other

## 2014-01-05 ENCOUNTER — Other Ambulatory Visit: Payer: Self-pay | Admitting: Family Medicine

## 2014-01-05 DIAGNOSIS — E559 Vitamin D deficiency, unspecified: Secondary | ICD-10-CM | POA: Diagnosis not present

## 2014-01-05 DIAGNOSIS — E785 Hyperlipidemia, unspecified: Secondary | ICD-10-CM

## 2014-01-05 DIAGNOSIS — I10 Essential (primary) hypertension: Secondary | ICD-10-CM

## 2014-01-05 DIAGNOSIS — E119 Type 2 diabetes mellitus without complications: Secondary | ICD-10-CM | POA: Diagnosis not present

## 2014-01-05 LAB — POCT CBC
Granulocyte percent: 53.3 %G (ref 37–80)
HCT, POC: 38.8 % (ref 37.7–47.9)
Hemoglobin: 12.4 g/dL (ref 12.2–16.2)
Lymph, poc: 1.5 (ref 0.6–3.4)
MCH: 29.1 pg (ref 27–31.2)
MCHC: 31.9 g/dL (ref 31.8–35.4)
MCV: 91.2 fL (ref 80–97)
MPV: 8.4 fL (ref 0–99.8)
PLATELET COUNT, POC: 173 10*3/uL (ref 142–424)
POC Granulocyte: 2.2 (ref 2–6.9)
POC LYMPH %: 37.2 % (ref 10–50)
RBC: 4.3 M/uL (ref 4.04–5.48)
RDW, POC: 13.6 %
WBC: 4.1 10*3/uL — AB (ref 4.6–10.2)

## 2014-01-05 LAB — POCT UA - MICROALBUMIN: MICROALBUMIN (UR) POC: NEGATIVE mg/L

## 2014-01-05 NOTE — Progress Notes (Signed)
Pt came in for labs only 

## 2014-01-06 LAB — VITAMIN D 25 HYDROXY (VIT D DEFICIENCY, FRACTURES): Vit D, 25-Hydroxy: 54.1 ng/mL (ref 30.0–100.0)

## 2014-01-06 LAB — HEPATIC FUNCTION PANEL
ALBUMIN: 4 g/dL (ref 3.5–4.8)
ALK PHOS: 75 IU/L (ref 39–117)
ALT: 56 IU/L — AB (ref 0–32)
AST: 69 IU/L — ABNORMAL HIGH (ref 0–40)
Bilirubin, Direct: 0.08 mg/dL (ref 0.00–0.40)
Total Bilirubin: 0.4 mg/dL (ref 0.0–1.2)
Total Protein: 6.6 g/dL (ref 6.0–8.5)

## 2014-01-06 LAB — BMP8+EGFR
BUN / CREAT RATIO: 18 (ref 11–26)
BUN: 16 mg/dL (ref 8–27)
CHLORIDE: 99 mmol/L (ref 97–108)
CO2: 25 mmol/L (ref 18–29)
Calcium: 9.6 mg/dL (ref 8.7–10.3)
Creatinine, Ser: 0.87 mg/dL (ref 0.57–1.00)
GFR calc Af Amer: 76 mL/min/{1.73_m2} (ref >59)
GFR calc non Af Amer: 66 mL/min/{1.73_m2} (ref >59)
Glucose: 116 mg/dL — ABNORMAL HIGH (ref 65–99)
Potassium: 5.5 mmol/L — ABNORMAL HIGH (ref 3.5–5.2)
SODIUM: 139 mmol/L (ref 134–144)

## 2014-01-06 LAB — NMR, LIPOPROFILE
CHOLESTEROL: 129 mg/dL (ref <200)
HDL Cholesterol by NMR: 48 mg/dL (ref >=40)
HDL Particle Number: 31.7 umol/L (ref >=30.5)
LDL PARTICLE NUMBER: 922 nmol/L (ref <1000)
LDL SIZE: 21.3 nm (ref >20.5)
LDLC SERPL CALC-MCNC: 58 mg/dL (ref <100)
LP-IR SCORE: 40 (ref <=45)
Small LDL Particle Number: 418 nmol/L (ref <=527)
Triglycerides by NMR: 114 mg/dL (ref <150)

## 2014-01-12 DIAGNOSIS — M1710 Unilateral primary osteoarthritis, unspecified knee: Secondary | ICD-10-CM | POA: Diagnosis not present

## 2014-01-12 DIAGNOSIS — M76899 Other specified enthesopathies of unspecified lower limb, excluding foot: Secondary | ICD-10-CM | POA: Diagnosis not present

## 2014-01-16 ENCOUNTER — Encounter: Payer: Self-pay | Admitting: Family Medicine

## 2014-01-16 ENCOUNTER — Ambulatory Visit (INDEPENDENT_AMBULATORY_CARE_PROVIDER_SITE_OTHER): Payer: Medicare Other | Admitting: Family Medicine

## 2014-01-16 VITALS — BP 148/70 | HR 54 | Temp 96.7°F | Ht 61.0 in | Wt 174.0 lb

## 2014-01-16 DIAGNOSIS — R1011 Right upper quadrant pain: Secondary | ICD-10-CM

## 2014-01-16 DIAGNOSIS — E875 Hyperkalemia: Secondary | ICD-10-CM

## 2014-01-16 DIAGNOSIS — K219 Gastro-esophageal reflux disease without esophagitis: Secondary | ICD-10-CM

## 2014-01-16 DIAGNOSIS — E785 Hyperlipidemia, unspecified: Secondary | ICD-10-CM

## 2014-01-16 DIAGNOSIS — I10 Essential (primary) hypertension: Secondary | ICD-10-CM

## 2014-01-16 DIAGNOSIS — R7309 Other abnormal glucose: Secondary | ICD-10-CM

## 2014-01-16 DIAGNOSIS — G8929 Other chronic pain: Secondary | ICD-10-CM

## 2014-01-16 DIAGNOSIS — E041 Nontoxic single thyroid nodule: Secondary | ICD-10-CM | POA: Diagnosis not present

## 2014-01-16 DIAGNOSIS — I2510 Atherosclerotic heart disease of native coronary artery without angina pectoris: Secondary | ICD-10-CM | POA: Diagnosis not present

## 2014-01-16 DIAGNOSIS — R7303 Prediabetes: Secondary | ICD-10-CM

## 2014-01-16 DIAGNOSIS — R911 Solitary pulmonary nodule: Secondary | ICD-10-CM

## 2014-01-16 DIAGNOSIS — K8020 Calculus of gallbladder without cholecystitis without obstruction: Secondary | ICD-10-CM

## 2014-01-16 LAB — POCT GLYCOSYLATED HEMOGLOBIN (HGB A1C): Hemoglobin A1C: 6.2

## 2014-01-16 NOTE — Patient Instructions (Addendum)
Medicare Annual Wellness Visit  [facility] and the medical providers at [facility] strive to bring you the best medical care.  In doing so we not only want to address your current medical conditions and concerns but also to detect new conditions early and prevent illness, disease and health-related problems.    Medicare offers a yearly Wellness Visit which allows our clinical staff to assess your need for preventative services including immunizations, lifestyle education, counseling to decrease risk of preventable diseases and screening for fall risk and other medical concerns.    This visit is provided free of charge (no copay) for all Medicare recipients. The clinical pharmacists at [facility] have begun to conduct these Wellness Visits which will also include a thorough review of all your medications.    As you primary medical provider recommend that you make an appointment for your Annual Wellness Visit if you have not done so already this year.  You may set up this appointment before you leave today or you may call back ([phone]) and schedule an appointment.  Please make sure when you call that you mention that you are scheduling your Annual Wellness Visit with the clinical pharmacist so that the appointment may be made for the proper length of time.      Continue current medications. Continue good therapeutic lifestyle changes which include good diet and exercise. Fall precautions discussed with patient. If an FOBT was given today- please return it to our front desk. If you are over 50 years old - you may need Prevnar 22 or the adult Pneumonia vaccine.  Try to start walking again Drink plenty of fluid Watch sodium intake Continue to monitor her blood pressures at home We will arrange for you to see a surgeon regarding her right upper quadrant pain, slightly elevated liver function tests and history of gallstones

## 2014-01-16 NOTE — Progress Notes (Signed)
Subjective:    Patient ID: Misty Smith, female    DOB: 1940/11/30, 73 y.o.   MRN: [id number]  HPI Pt here for follow up and management of chronic medical problems. The patient comes to the visit today with her husband. Her lab work will be reviewed with her during the visit today in addition to getting a future order for a repeat CT scan in one year. CT scan will be discussed with her she will be given a copy of the report. The patient brings in her blood pressure readings from home and overall these blood pressure readings are good. She does is go occasional right upper quadrant pain with nausea every 4-6 weeks frequency. The recent CT scan did reveal gallstones. She in the past has associated this with reflux.        Patient Active Problem List   Diagnosis Date Noted  . CAD (coronary artery disease) 12/30/2013  . 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 Osteopenia of the elderly 10/26/2013  . Generalized anxiety disorder 09/21/2013  . GERD (gastroesophageal reflux disease) 09/21/2013  . Pulmonary nodule seen on imaging study 09/21/2013  . SYNCOPE 02/02/2009  . Pre-diabetes 02/01/2009  . HYPERLIPIDEMIA 02/01/2009  . HYPERTENSION 02/01/2009  . DIZZINESS 02/01/2009  . PALPITATIONS 02/01/2009   Outpatient Encounter Prescriptions as of 01/16/2014  Medication Sig  . ALPRAZolam (XANAX) 0.25 MG tablet Take 0.25 mg by mouth at bedtime as needed.    Marland Kitchen aspirin EC 81 MG tablet Take 1 tablet (81 mg total) by mouth daily.  Marland Kitchen atorvastatin (LIPITOR) 80 MG tablet TAKE 1 TABLET BY MOUTH DAILY  . Calcium Carb-Cholecalciferol (CALTRATE 600+D) 600-800 MG-UNIT TABS Take 1 tablet by mouth every evening.   . cholecalciferol (VITAMIN D) 1000 UNITS tablet Take 2,000 Units by mouth daily.  . clobetasol (TEMOVATE) 0.05 % external solution Apply 1 application topically 2 (two) times daily.  Marland Kitchen desonide (DESOWEN) 0.05 % cream Apply 1 application topically 2 (two) times daily.  Marland Kitchen escitalopram  (LEXAPRO) 20 MG tablet TAKE 1 TABLET EVERY DAY AS DIRECTED  . estradiol (ESTRACE VAGINAL) 0.1 MG/GM vaginal cream Place 2 g vaginally daily. For 3 weeks     . Halcinonide (HALOG) 0.1 % CREA Apply 1 application topically 2 (two) times daily.  Marland Kitchen lisinopril (PRINIVIL,ZESTRIL) 20 MG tablet TAKE 1 TABLET (20 MG TOTAL) BY MOUTH DAILY.  Marland Kitchen LOVAZA 1 G capsule TAKE 2 CAPSULES BY MOUTH TWICE A DAY  . meloxicam (MOBIC) 15 MG tablet Take 15 mg by mouth as needed.   . metoprolol tartrate (LOPRESSOR) 25 MG tablet 1 tab po BID  . Multiple Vitamin (MULTIVITAMIN) tablet Take 1 tablet by mouth every morning.   Marland Kitchen NEXIUM 40 MG capsule TAKE 1 CAPSULE BY MOUTH ONCE A DAY  . pioglitazone (ACTOS) 30 MG tablet TAKE 1 TABLET (30 MG TOTAL) BY MOUTH DAILY.    Review of Systems  Constitutional: Negative.   HENT: Negative.   Eyes: Negative.   Respiratory: Negative.   Cardiovascular: Negative.   Gastrointestinal: Negative.   Endocrine: Negative.   Genitourinary: Negative.   Musculoskeletal: Negative.   Skin: Negative.   Allergic/Immunologic: Negative.   Neurological: Negative.   Hematological: Negative.   Psychiatric/Behavioral: Negative.        Objective:   Physical Exam  Nursing note and vitals reviewed. Constitutional: She is oriented to person, place, and time. She appears well-developed and well-nourished. No distress.  HENT:  Head: Normocephalic and atraumatic.  Right Ear: External ear normal.  Left  Ear: External ear normal.  Nose: Nose normal.  Mouth/Throat: Oropharynx is clear and moist. No oropharyngeal exudate.  Eyes: Conjunctivae and EOM are normal. Pupils are equal, round, and reactive to light. Right eye exhibits no discharge. Left eye exhibits no discharge. No scleral icterus.  Neck: Normal range of motion. Neck supple. No thyromegaly present.  No carotid bruits  Cardiovascular: Normal rate, regular rhythm, normal heart sounds and intact distal pulses.  Exam reveals no gallop and no  friction rub.   No murmur heard. At 60 per minute  Pulmonary/Chest: Effort normal and breath sounds normal. No respiratory distress. She has no wheezes. She has no rales. She exhibits no tenderness.  Abdominal: Soft. Bowel sounds are normal. She exhibits no mass. There is tenderness. There is no rebound and no guarding.  Slight right upper quadrant tenderness to palpation. Slight epigastric tenderness. Slight lower abdominal tenderness  Musculoskeletal: Normal range of motion. She exhibits no edema and no tenderness.  Lymphadenopathy:    She has no cervical adenopathy.  Neurological: She is alert and oriented to person, place, and time. She has normal reflexes. No cranial nerve deficit.  Skin: Skin is warm and dry. No rash noted.  Psychiatric: She has a normal mood and affect. Her behavior is normal. Judgment and thought content normal.   BP 148/70  Pulse 54  Temp(Src) 96.7 F (35.9 C) (Oral)  Ht $R'5\' 1"'ef$  (1.549 m)  Wt 174 lb (78.926 kg)  BMI 32.89 kg/m2        Assessment & Plan:  1. CAD (coronary artery disease)  2. GERD (gastroesophageal reflux disease)  3. HYPERLIPIDEMIA  4. HYPERTENSION  5. Pre-diabete-  POCT glycosylated hemoglobin (Hb A1C)  6. Serum potassium elevated - BMP8+EGFR  7. Thyroid nodule -Repeat CT scan one year - Thyroid Panel With TSH  8. Pulmonary nodule seen on imaging study  9. Gallstones - Ambulatory referral to General Surgery  10. Abdominal pain, chronic, right upper quadrant - Ambulatory referral to General Surgery No orders of the defined types were placed in this encounter.   Patient Instructions                       Medicare Annual Wellness Visit  [facility] and the medical providers at [facility] strive to bring you the best medical care.  In doing so we not only want to address your current medical conditions and concerns but also to detect new conditions early and prevent illness, disease and health-related  problems.    Medicare offers a yearly Wellness Visit which allows our clinical staff to assess your need for preventative services including immunizations, lifestyle education, counseling to decrease risk of preventable diseases and screening for fall risk and other medical concerns.    This visit is provided free of charge (no copay) for all Medicare recipients. The clinical pharmacists at [facility] have begun to conduct these Wellness Visits which will also include a thorough review of all your medications.    As you primary medical provider recommend that you make an appointment for your Annual Wellness Visit if you have not done so already this year.  You may set up this appointment before you leave today or you may call back ([phone]) and schedule an appointment.  Please make sure when you call that you mention that you are scheduling your Annual Wellness Visit with the clinical pharmacist so that the appointment may be made for the proper length of  time.      Continue current medications. Continue good therapeutic lifestyle changes which include good diet and exercise. Fall precautions discussed with patient. If an FOBT was given today- please return it to our front desk. If you are over 49 years old - you may need Prevnar 35 or the adult Pneumonia vaccine.  Try to start walking again Drink plenty of fluid Watch sodium intake Continue to monitor her blood pressures at home We will arrange for you to see a surgeon regarding her right upper quadrant pain, slightly elevated liver function tests and history of gallstones   Arrie Senate MD

## 2014-01-17 LAB — BMP8+EGFR
BUN / CREAT RATIO: 23 (ref 11–26)
BUN: 16 mg/dL (ref 8–27)
CHLORIDE: 97 mmol/L (ref 97–108)
CO2: 21 mmol/L (ref 18–29)
CREATININE: 0.69 mg/dL (ref 0.57–1.00)
Calcium: 10.3 mg/dL (ref 8.7–10.3)
GFR calc Af Amer: 99 mL/min/{1.73_m2} (ref >59)
GFR calc non Af Amer: 86 mL/min/{1.73_m2} (ref >59)
GLUCOSE: 82 mg/dL (ref 65–99)
Potassium: 4.5 mmol/L (ref 3.5–5.2)
Sodium: 138 mmol/L (ref 134–144)

## 2014-01-17 LAB — THYROID PANEL WITH TSH
Free Thyroxine Index: 2.2 (ref 1.2–4.9)
T3 Uptake Ratio: 29 % (ref 24–39)
T4, Total: 7.7 ug/dL (ref 4.5–12.0)
TSH: 0.852 u[IU]/mL (ref 0.450–4.500)

## 2014-01-26 ENCOUNTER — Other Ambulatory Visit: Payer: Self-pay | Admitting: Family Medicine

## 2014-01-30 ENCOUNTER — Other Ambulatory Visit: Payer: Self-pay | Admitting: Family Medicine

## 2014-03-02 ENCOUNTER — Encounter (INDEPENDENT_AMBULATORY_CARE_PROVIDER_SITE_OTHER): Payer: Self-pay | Admitting: Surgery

## 2014-03-02 ENCOUNTER — Ambulatory Visit (INDEPENDENT_AMBULATORY_CARE_PROVIDER_SITE_OTHER): Payer: Medicare Other | Admitting: Surgery

## 2014-03-02 ENCOUNTER — Other Ambulatory Visit (INDEPENDENT_AMBULATORY_CARE_PROVIDER_SITE_OTHER): Payer: Self-pay | Admitting: Surgery

## 2014-03-02 VITALS — BP 126/74 | HR 73 | Temp 97.5°F | Ht 61.0 in | Wt 172.0 lb

## 2014-03-02 DIAGNOSIS — K8020 Calculus of gallbladder without cholecystitis without obstruction: Secondary | ICD-10-CM

## 2014-03-02 DIAGNOSIS — K829 Disease of gallbladder, unspecified: Secondary | ICD-10-CM | POA: Insufficient documentation

## 2014-03-02 NOTE — Progress Notes (Signed)
Re:   Misty Smith DOB:   [birth year] MRN:   [id number]  ASSESSMENT AND PLAN: 1.  Gall bladder disease, gall stones  I discussed with the patient the indications and risks of gall bladder surgery.  The primary risks of gall bladder surgery include, but are not limited to, bleeding, infection, common bile duct injury, and open surgery.  There is also the risk that the patient may have continued symptoms after surgery.  However, the likelihood of improvement in symptoms and return to the patient's normal status is good. We discussed the typical post-operative recovery course. I tried to answer the patient's questions.  I gave the patient literature about gall bladder surgery.  Her husband had common bile duct stones.  She cannot remember who in our group did his surgery.  We will schedule her for surgery.  2.  Paratracheal lesion seen on CT scan - plan repeat CT scan next year. 3.  Small hiatal hernia 4.  Anxiety/stress 5.  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 6.  HTN 7.  Lupus  Chief Complaint  Patient presents with  . eval gallstones   REFERRING PHYSICIAN: Redge Gainer, MD  HISTORY OF PRESENT ILLNESS: Misty Smith is a 73 y.o. (DOB: Aug 30, 1941) white  female whose primary care physician is Redge Gainer, MD and comes to me today for Gall bladder disease.  Ms January has had symptoms for some time. She has had discomfort in her epigastrium and right upper quadrant. This can occur as much as 3 times a week, but sometimes skips weeks. She has some nausea but no vomiting, no fever, but she'll have an urgency to go have a bowel movement. She had a recent CT scan, which showed the gall stones, which lead her to being referred to our office. Her son had gall bladder surgery.  He is the only one in the family to have gall bladder problems.  Her husband had CBD stones and did well with surgery.  CT scan - 12/16/2103 - 1. Similar appearance, over 2 years, of innumerable  basilar predominant lung nodules. Most consistent with a post infectious or inflammatory etiology.  2. A right paratracheal partially calcified lesion is similar versus a minimally enlarged when compared back to 12/07/2012. Favored to represent an exophytic or accessory thyroid tissue/nodule. Consider followup with chest CT at 1 year to confirm 2 years of stability.  3. Atherosclerosis, including within the coronary arteries.  4. Small hiatal hernia  5. Cholelithiasis  We talked about the surgery.  She wants to go ahead before she has more symptoms.  Sthe does not want to have the trouble the her husband had.   Past Medical History  Diagnosis Date  . Anxiety   . Stress   . Colovaginal fistula   . Hypertension   . Postmenopausal HRT (hormone replacement therapy)   . Hyperlipidemia   . Perimenopausal vasomotor symptoms   . Esophageal stricture   . Subacute lupus erythematosus     Dr. Sol Blazing    . Pre-diabetes   . Fractured elbow 1970's      Past Surgical History  Procedure Laterality Date  . Abdominal bypass  1987    fibroids   . Bilateral tubal ligtation  1972  . Tonsillectomy    . Abdominal hysterectomy      partial  . Elbow Right     surgery after fracture of elbow      Current Outpatient Prescriptions  Medication Sig Dispense Refill  .  ALPRAZolam (XANAX) 0.25 MG tablet Take 0.25 mg by mouth at bedtime as needed.        Marland Kitchen aspirin EC 81 MG tablet Take 1 tablet (81 mg total) by mouth daily.  90 tablet  3  . atorvastatin (LIPITOR) 80 MG tablet TAKE 1 TABLET BY MOUTH DAILY  90 tablet  1  . Calcium Carb-Cholecalciferol (CALTRATE 600+D) 600-800 MG-UNIT TABS Take 1 tablet by mouth every evening.       . cholecalciferol (VITAMIN D) 1000 UNITS tablet Take 2,000 Units by mouth daily.      . clobetasol (TEMOVATE) 0.05 % external solution Apply 1 application topically 2 (two) times daily.      Marland Kitchen desonide (DESOWEN) 0.05 % cream Apply 1 application topically 2 (two) times daily.        Marland Kitchen escitalopram (LEXAPRO) 20 MG tablet TAKE 1 TABLET EVERY DAY AS DIRECTED  90 tablet  0  . estradiol (ESTRACE VAGINAL) 0.1 MG/GM vaginal cream Place 2 g vaginally daily. For 3 weeks         . Halcinonide (HALOG) 0.1 % CREA Apply 1 application topically 2 (two) times daily.      Marland Kitchen lisinopril (PRINIVIL,ZESTRIL) 20 MG tablet TAKE 1 TABLET (20 MG TOTAL) BY MOUTH DAILY.  90 tablet  1  . meloxicam (MOBIC) 15 MG tablet Take 15 mg by mouth as needed.       . metoprolol tartrate (LOPRESSOR) 25 MG tablet 1 tab po BID      . Multiple Vitamin (MULTIVITAMIN) tablet Take 1 tablet by mouth every morning.       Marland Kitchen NEXIUM 40 MG capsule TAKE 1 CAPSULE BY MOUTH ONCE A DAY  90 capsule  0  . omega-3 acid ethyl esters (LOVAZA) 1 G capsule TAKE 2 CAPSULES BY MOUTH TWICE A DAY  360 capsule  1  . pioglitazone (ACTOS) 30 MG tablet TAKE 1 TABLET (30 MG TOTAL) BY MOUTH DAILY.  90 tablet  1   No current facility-administered medications for this visit.    Allergies  Allergen Reactions  . Ciprofloxacin Rash  . Codeine Rash  . Iohexol      Desc: PT HAD HIVES AND WAS GIVEN BENEDRYL PO BY NURSE AND OBSERVED IN NURSES STATION AND RELEASED WITHOUT AND OTHER COMPLICATIONS SHEET SCANNED UNDER NURSES NOTES///ON 12/01/11 PT HAD ANOTHER CT WITH PREMEDS MINUS THE BENADRYL. SHE HAD A DELAYED REACTION AFTER WITH THROAT TIGHTNESS AND SOB, JB  12/07/12   . Penicillins   . Sulfonamide Derivatives   . Asa [Aspirin] Other (See Comments)    Large amounts causes stomach pain  . Keflex [Cephalexin] Nausea And Vomiting    REVIEW OF SYSTEMS: Skin:  No history of rash.  No history of abnormal moles. Infection:  No history of hepatitis or HIV.  No history of MRSA. Neurologic:  No history of stroke.  No history of seizure.  No history of headaches. Cardiac:  HTN x 20 years.  Sees Dr. Stanford Breed annually.  She had a cath in the 1990's.  She has had stress test and echos since then. Pulmonary:  Does not smoke cigarettes.  No asthma or  bronchitis.  No OSA/CPAP.  Endocrine:  No diabetes. Possible thyroid nodule on CT scan. Gastrointestinal:  GERD on nexium.  No history of liver disease.  No history of gall bladder disease.  No history of pancreas disease.  Colonoscopy by Dr. Watt Climes in 2009. Urologic:  No history of kidney stones.  No history of bladder infections. Musculoskeletal:  Knee problems, has had injections in her knees.  Dr. Alvan Dame Hematologic:  No bleeding disorder.  No history of anemia.  Not anticoagulated. Psycho-social:  The patient is oriented.   The patient has no obvious psychologic or social impairment to understanding our conversation and plan.  SOCIAL and FAMILY HISTORY: Married. Though her husband has some memory problems. She has 4 children:  52, 49,46, and 42. Her daughter works in the radiology dept at [employer]. Her son had his gall bladder out.  PHYSICAL EXAM: BP 126/74  Pulse 73  Temp(Src) 97.5 F (36.4 C)  Ht 5\' 1"  (1.549 m)  Wt 172 lb (78.019 kg)  BMI 32.52 kg/m2  General: Obese WF who is alert and generally healthy appearing.  HEENT: Normal. Pupils equal. Neck: Supple. No mass.  No thyroid mass. Lymph Nodes:  No supraclavicular or cervical nodes. Lungs: Clear to auscultation and symmetric breath sounds. Heart:  RRR. No murmur or rub. Abdomen: Soft. No mass. No tenderness. No hernia. Normal bowel sounds.  Pfannenstiel scar. Rectal: Not done. Extremities:  Good strength and ROM  in upper and lower extremities. Neurologic:  Grossly intact to motor and sensory function. Psychiatric: Has normal mood and affect. Behavior is normal.   DATA REVIEWED: Notes in Epic. I gave her a copy of her CT scan report.  Alphonsa Overall, MD,  [facility], [address]  [place], [place]    [place] Phone:  [phone] FAX:  [phone]

## 2014-03-09 ENCOUNTER — Other Ambulatory Visit (INDEPENDENT_AMBULATORY_CARE_PROVIDER_SITE_OTHER): Payer: Self-pay | Admitting: Surgery

## 2014-03-13 ENCOUNTER — Encounter (HOSPITAL_COMMUNITY): Payer: Self-pay | Admitting: Pharmacy Technician

## 2014-03-13 ENCOUNTER — Other Ambulatory Visit: Payer: Self-pay | Admitting: Family Medicine

## 2014-03-14 NOTE — Telephone Encounter (Signed)
Pt states she is taking 1/2 tablet daily

## 2014-03-15 NOTE — Pre-Procedure Instructions (Signed)
STORIE HEFFERN  03/15/2014   Your procedure is scheduled on: Tuesday, March 21, 2014  Report to [facility] Admitting at 8:00 AM.  Call this number if you have problems the morning of surgery: [phone]   Remember:   Do not eat food or drink liquids after midnight Monday, March 20, 2014   Take these medicines the morning of surgery with A SIP OF WATER: escitalopram (LEXAPRO),   metoprolol tartrate (LOPRESSOR).   DO NOT TAKE ANY DIABETIC MEDICATIONS THE MORNING OF SURGERY Stop taking Aspirin, Multivitamins, and herbal medications. Do not take any NSAIDs ie: Ibuprofen, Advil, Naproxen or any medication containing Aspirin (meloxicam (MOBIC).    Do not wear jewelry, make-up or nail polish.  Do not wear lotions, powders, or perfumes. You may NOT wear deodorant.  Do not shave 48 hours prior to surgery.   Do not bring valuables to the hospital.  [facility] is not responsible for any belongings or valuables.               Contacts, dentures or bridgework may not be worn into surgery.  Leave suitcase in the car. After surgery it may be brought to your room.  For patients admitted to the hospital, discharge time is determined by your treatment team.               Patients discharged the day of surgery will not be allowed to drive home.  Name and phone number of your driver:   Special Instructions:  Special Instructions:Special Instructions: [facility] - Preparing for Surgery  Before surgery, you can play an important role.  Because skin is not sterile, your skin needs to be as free of germs as possible.  You can reduce the number of germs on you skin by washing with CHG (chlorahexidine gluconate) soap before surgery.  CHG is an antiseptic cleaner which kills germs and bonds with the skin to continue killing germs even after washing.  Please DO NOT use if you have an allergy to CHG or antibacterial soaps.  If your skin becomes reddened/irritated stop using the CHG and inform your  nurse when you arrive at Short Stay.  Do not shave (including legs and underarms) for at least 48 hours prior to the first CHG shower.  You may shave your face.  Please follow these instructions carefully:   1.  Shower with CHG Soap the night before surgery and the morning of Surgery.  2.  If you choose to wash your hair, wash your hair first as usual with your normal shampoo.  3.  After you shampoo, rinse your hair and body thoroughly to remove the Shampoo.  4.  Use CHG as you would any other liquid soap.  You can apply chg directly  to the skin and wash gently with scrungie or a clean washcloth.  5.  Apply the CHG Soap to your body ONLY FROM THE NECK DOWN.  Do not use on open wounds or open sores.  Avoid contact with your eyes, ears, mouth and genitals (private parts).  Wash genitals (private parts) with your normal soap.  6.  Wash thoroughly, paying special attention to the area where your surgery will be performed.  7.  Thoroughly rinse your body with warm water from the neck down.  8.  DO NOT shower/wash with your normal soap after using and rinsing off the CHG Soap.  9.  Pat yourself dry with a clean towel.  10.  Wear clean pajamas.            11.  Place clean sheets on your bed the night of your first shower and do not sleep with pets.  Day of Surgery  Do not apply any lotions/deodorants the morning of surgery.  Please wear clean clothes to the hospital/surgery center.   Please read over the following fact sheets that you were given: Pain Booklet, Coughing and Deep Breathing and Surgical Site Infection Prevention

## 2014-03-16 ENCOUNTER — Ambulatory Visit (HOSPITAL_COMMUNITY)
Admission: RE | Admit: 2014-03-16 | Discharge: 2014-03-16 | Disposition: A | Discharge status: Home / Self Care | Payer: Medicare Other | Source: Ambulatory Visit | Attending: Anesthesiology | Admitting: Anesthesiology

## 2014-03-16 ENCOUNTER — Encounter (HOSPITAL_COMMUNITY): Payer: Self-pay

## 2014-03-16 ENCOUNTER — Encounter (HOSPITAL_COMMUNITY)
Admission: RE | Admit: 2014-03-16 | Discharge: 2014-03-16 | Disposition: A | Discharge status: Home / Self Care | Payer: Medicare Other | Source: Ambulatory Visit | Attending: Surgery | Admitting: Surgery

## 2014-03-16 DIAGNOSIS — Z01818 Encounter for other preprocedural examination: Secondary | ICD-10-CM | POA: Insufficient documentation

## 2014-03-16 HISTORY — DX: Major depressive disorder, single episode, unspecified: F32.9

## 2014-03-16 HISTORY — DX: Gastro-esophageal reflux disease without esophagitis: K21.9

## 2014-03-16 HISTORY — DX: Cardiac arrhythmia, unspecified: I49.9

## 2014-03-16 HISTORY — DX: Bronchitis, not specified as acute or chronic: J40

## 2014-03-16 HISTORY — DX: Personal history of other diseases of the digestive system: Z87.19

## 2014-03-16 HISTORY — DX: Type 2 diabetes mellitus without complications: E11.9

## 2014-03-16 HISTORY — DX: Unspecified osteoarthritis, unspecified site: M19.90

## 2014-03-16 HISTORY — DX: Depression, unspecified: F32.A

## 2014-03-16 LAB — CBC WITH DIFFERENTIAL/PLATELET
Basophils Absolute: 0 10*3/uL (ref 0.0–0.1)
Basophils Relative: 1 % (ref 0–1)
EOS PCT: 2 % (ref 0–5)
Eosinophils Absolute: 0.1 10*3/uL (ref 0.0–0.7)
HCT: 36.9 % (ref 36.0–46.0)
Hemoglobin: 12.6 g/dL (ref 12.0–15.0)
LYMPHS ABS: 2.2 10*3/uL (ref 0.7–4.0)
Lymphocytes Relative: 38 % (ref 12–46)
MCH: 31.6 pg (ref 26.0–34.0)
MCHC: 34.1 g/dL (ref 30.0–36.0)
MCV: 92.5 fL (ref 78.0–100.0)
Monocytes Absolute: 0.8 10*3/uL (ref 0.1–1.0)
Monocytes Relative: 14 % — ABNORMAL HIGH (ref 3–12)
NEUTROS ABS: 2.6 10*3/uL (ref 1.7–7.7)
Neutrophils Relative %: 45 % (ref 43–77)
PLATELETS: 206 10*3/uL (ref 150–400)
RBC: 3.99 MIL/uL (ref 3.87–5.11)
RDW: 15.4 % (ref 11.5–15.5)
WBC: 5.8 10*3/uL (ref 4.0–10.5)

## 2014-03-16 LAB — COMPREHENSIVE METABOLIC PANEL
ALK PHOS: 62 U/L (ref 39–117)
ALT: 52 U/L — AB (ref 0–35)
AST: 53 U/L — AB (ref 0–37)
Albumin: 3.4 g/dL — ABNORMAL LOW (ref 3.5–5.2)
Anion gap: 14 (ref 5–15)
BILIRUBIN TOTAL: 0.4 mg/dL (ref 0.3–1.2)
BUN: 11 mg/dL (ref 6–23)
CALCIUM: 9.6 mg/dL (ref 8.4–10.5)
CHLORIDE: 98 meq/L (ref 96–112)
CO2: 27 meq/L (ref 19–32)
Creatinine, Ser: 0.64 mg/dL (ref 0.50–1.10)
GFR calc Af Amer: >90 mL/min (ref >90)
GFR, EST NON AFRICAN AMERICAN: 85 mL/min — AB (ref >90)
Glucose, Bld: 89 mg/dL (ref 70–99)
POTASSIUM: 4.6 meq/L (ref 3.7–5.3)
SODIUM: 139 meq/L (ref 137–147)
Total Protein: 7.1 g/dL (ref 6.0–8.3)

## 2014-03-16 MED ORDER — CHLORHEXIDINE GLUCONATE 4 % EX LIQD: 1.0000 "application " | Freq: Once | CUTANEOUS | Status: DC

## 2014-03-17 ENCOUNTER — Encounter (HOSPITAL_COMMUNITY): Payer: Self-pay

## 2014-03-17 NOTE — Progress Notes (Signed)
Anesthesia Chart Review:  Patient is a 74 year old female scheduled for cholecystectomy on 03/21/14 by Dr. Alphonsa Overall.  History includes non-smoker, palpitations (PVCs), coronary calcifications on 11/2013 CT, HTN, HLD, DM2, GERD, hiatal hernia, subacute lupus erythematosus (Dr. Sol Blazing).  BMI is consistent with obesity.  PCP is Dr. Redge Gainer. Cardiologist is Dr. Stanford Breed, last visit 12/30/13 with no additional cardiac testing recommended at that time.   EKG on 12/30/13 showed: SB at 55 bpm.  She had a normal nuclear study, EF 80% on 02/20/09.  Echo on 02/20/09 showed:  1. Left ventricle: The cavity size was normal. Systolic function was normal. The estimated ejection fraction was in the range of 55% to 65%. 2. Mitral valve: Calcified annulus. Trivial regurgitation.  CXR on 03/16/14 showed: No active cardiopulmonary disease.  Preoperative labs noted.  AST/ALT 53/52, stable since 01/05/14.  PLT WNL. A1C on 01/16/14 was 6.2.  She has had recent cardiology follow-up within the past three months.  Labs appear stable.  If no acute changes then I would anticipate that she could proceed as planned.  George Hugh [facility]/Anesthesiology Phone [phone] 03/17/2014 10:06 AM

## 2014-03-20 ENCOUNTER — Other Ambulatory Visit: Payer: Self-pay | Admitting: Family Medicine

## 2014-03-20 MED ORDER — CHLORHEXIDINE GLUCONATE 4 % EX LIQD
1.0000 "application " | Freq: Once | CUTANEOUS | Status: DC
  Filled 2014-03-20: qty 15

## 2014-03-21 ENCOUNTER — Encounter (HOSPITAL_COMMUNITY): Payer: Self-pay | Admitting: Anesthesiology

## 2014-03-21 ENCOUNTER — Encounter (HOSPITAL_COMMUNITY): Payer: Medicare Other | Admitting: Vascular Surgery

## 2014-03-21 ENCOUNTER — Ambulatory Visit (HOSPITAL_COMMUNITY)
Admission: RE | Admit: 2014-03-21 | Discharge: 2014-03-21 | Disposition: A | Discharge status: Home / Self Care | Payer: Medicare Other | Source: Ambulatory Visit | Attending: Surgery | Admitting: Surgery

## 2014-03-21 ENCOUNTER — Ambulatory Visit (HOSPITAL_COMMUNITY): Payer: Medicare Other

## 2014-03-21 ENCOUNTER — Encounter (HOSPITAL_COMMUNITY)
Admission: RE | Disposition: A | Discharge status: Home / Self Care | Payer: Self-pay | Source: Ambulatory Visit | Attending: Surgery

## 2014-03-21 ENCOUNTER — Ambulatory Visit (HOSPITAL_COMMUNITY): Payer: Medicare Other | Admitting: Anesthesiology

## 2014-03-21 DIAGNOSIS — K222 Esophageal obstruction: Secondary | ICD-10-CM | POA: Diagnosis not present

## 2014-03-21 DIAGNOSIS — K8010 Calculus of gallbladder with chronic cholecystitis without obstruction: Secondary | ICD-10-CM | POA: Insufficient documentation

## 2014-03-21 DIAGNOSIS — IMO0002 Reserved for concepts with insufficient information to code with codable children: Secondary | ICD-10-CM | POA: Insufficient documentation

## 2014-03-21 DIAGNOSIS — E669 Obesity, unspecified: Secondary | ICD-10-CM | POA: Diagnosis not present

## 2014-03-21 DIAGNOSIS — Z7982 Long term (current) use of aspirin: Secondary | ICD-10-CM | POA: Diagnosis not present

## 2014-03-21 DIAGNOSIS — E785 Hyperlipidemia, unspecified: Secondary | ICD-10-CM | POA: Diagnosis not present

## 2014-03-21 DIAGNOSIS — D491 Neoplasm of unspecified behavior of respiratory system: Secondary | ICD-10-CM | POA: Insufficient documentation

## 2014-03-21 DIAGNOSIS — Z791 Long term (current) use of non-steroidal anti-inflammatories (NSAID): Secondary | ICD-10-CM | POA: Diagnosis not present

## 2014-03-21 DIAGNOSIS — Z90710 Acquired absence of both cervix and uterus: Secondary | ICD-10-CM | POA: Insufficient documentation

## 2014-03-21 DIAGNOSIS — M329 Systemic lupus erythematosus, unspecified: Secondary | ICD-10-CM | POA: Diagnosis not present

## 2014-03-21 DIAGNOSIS — K839 Disease of biliary tract, unspecified: Secondary | ICD-10-CM | POA: Diagnosis not present

## 2014-03-21 DIAGNOSIS — K219 Gastro-esophageal reflux disease without esophagitis: Secondary | ICD-10-CM | POA: Diagnosis not present

## 2014-03-21 DIAGNOSIS — F341 Dysthymic disorder: Secondary | ICD-10-CM | POA: Insufficient documentation

## 2014-03-21 DIAGNOSIS — R7309 Other abnormal glucose: Secondary | ICD-10-CM | POA: Diagnosis not present

## 2014-03-21 DIAGNOSIS — I2510 Atherosclerotic heart disease of native coronary artery without angina pectoris: Secondary | ICD-10-CM | POA: Diagnosis not present

## 2014-03-21 DIAGNOSIS — M1990 Unspecified osteoarthritis, unspecified site: Secondary | ICD-10-CM | POA: Diagnosis not present

## 2014-03-21 DIAGNOSIS — I10 Essential (primary) hypertension: Secondary | ICD-10-CM | POA: Insufficient documentation

## 2014-03-21 DIAGNOSIS — K829 Disease of gallbladder, unspecified: Secondary | ICD-10-CM | POA: Diagnosis not present

## 2014-03-21 DIAGNOSIS — K8020 Calculus of gallbladder without cholecystitis without obstruction: Secondary | ICD-10-CM | POA: Diagnosis present

## 2014-03-21 DIAGNOSIS — F411 Generalized anxiety disorder: Secondary | ICD-10-CM | POA: Diagnosis not present

## 2014-03-21 DIAGNOSIS — Z79890 Hormone replacement therapy (postmenopausal): Secondary | ICD-10-CM | POA: Diagnosis not present

## 2014-03-21 DIAGNOSIS — K449 Diaphragmatic hernia without obstruction or gangrene: Secondary | ICD-10-CM | POA: Diagnosis not present

## 2014-03-21 DIAGNOSIS — Z6832 Body mass index (BMI) 32.0-32.9, adult: Secondary | ICD-10-CM | POA: Insufficient documentation

## 2014-03-21 DIAGNOSIS — Z9889 Other specified postprocedural states: Secondary | ICD-10-CM | POA: Insufficient documentation

## 2014-03-21 DIAGNOSIS — K811 Chronic cholecystitis: Secondary | ICD-10-CM | POA: Diagnosis not present

## 2014-03-21 HISTORY — PX: CHOLECYSTECTOMY: SHX55

## 2014-03-21 LAB — GLUCOSE, CAPILLARY
GLUCOSE-CAPILLARY: 119 mg/dL — AB (ref 70–99)
Glucose-Capillary: 99 mg/dL (ref 70–99)

## 2014-03-21 SURGERY — LAPAROSCOPIC CHOLECYSTECTOMY WITH INTRAOPERATIVE CHOLANGIOGRAM
Anesthesia: General | Site: Abdomen

## 2014-03-21 MED ORDER — SUCCINYLCHOLINE CHLORIDE 20 MG/ML IJ SOLN
INTRAMUSCULAR | Status: DC | PRN
  Administered 2014-03-21: 100 mg via INTRAVENOUS

## 2014-03-21 MED ORDER — LACTATED RINGERS IV SOLN
INTRAVENOUS | Status: DC
  Administered 2014-03-21: 09:00:00 via INTRAVENOUS

## 2014-03-21 MED ORDER — LIDOCAINE HCL (CARDIAC) 20 MG/ML IV SOLN
INTRAVENOUS | Status: DC | PRN
  Administered 2014-03-21: 60 mg via INTRAVENOUS

## 2014-03-21 MED ORDER — FENTANYL CITRATE 0.05 MG/ML IJ SOLN
INTRAMUSCULAR | Status: DC | PRN
  Administered 2014-03-21: 50 ug via INTRAVENOUS
  Administered 2014-03-21: 100 ug via INTRAVENOUS
  Administered 2014-03-21: 50 ug via INTRAVENOUS

## 2014-03-21 MED ORDER — SODIUM CHLORIDE 0.9 % IR SOLN
Status: DC | PRN
  Administered 2014-03-21 (×2): 1000 mL

## 2014-03-21 MED ORDER — ROCURONIUM BROMIDE 100 MG/10ML IV SOLN
INTRAVENOUS | Status: DC | PRN
  Administered 2014-03-21: 30 mg via INTRAVENOUS

## 2014-03-21 MED ORDER — ACETAMINOPHEN 325 MG PO TABS
650.0000 mg | ORAL_TABLET | Freq: Four times a day (QID) | ORAL | Status: DC | PRN
  Administered 2014-03-21: 650 mg via ORAL

## 2014-03-21 MED ORDER — ONDANSETRON HCL 4 MG/2ML IJ SOLN: 4.0000 mg | Freq: Once | INTRAMUSCULAR | Status: DC | PRN

## 2014-03-21 MED ORDER — ACETAMINOPHEN 325 MG PO TABS
ORAL_TABLET | ORAL | Status: AC
  Filled 2014-03-21: qty 2

## 2014-03-21 MED ORDER — FENTANYL CITRATE 0.05 MG/ML IJ SOLN
INTRAMUSCULAR | Status: AC
  Filled 2014-03-21: qty 5

## 2014-03-21 MED ORDER — LACTATED RINGERS IV SOLN
INTRAVENOUS | Status: DC | PRN
  Administered 2014-03-21: 09:00:00 via INTRAVENOUS

## 2014-03-21 MED ORDER — BUPIVACAINE HCL (PF) 0.25 % IJ SOLN
INTRAMUSCULAR | Status: AC
  Filled 2014-03-21: qty 30

## 2014-03-21 MED ORDER — HYDROCODONE-ACETAMINOPHEN 5-325 MG PO TABS: 1.0000 | ORAL_TABLET | Freq: Four times a day (QID) | ORAL | Status: DC | PRN

## 2014-03-21 MED ORDER — ONDANSETRON HCL 4 MG/2ML IJ SOLN
INTRAMUSCULAR | Status: DC | PRN
  Administered 2014-03-21: 4 mg via INTRAVENOUS

## 2014-03-21 MED ORDER — PROPOFOL 10 MG/ML IV BOLUS
INTRAVENOUS | Status: DC | PRN
Start: 2014-03-21 — End: 2014-03-21
  Administered 2014-03-21: 150 mg via INTRAVENOUS

## 2014-03-21 MED ORDER — 0.9 % SODIUM CHLORIDE (POUR BTL) OPTIME
TOPICAL | Status: DC | PRN
  Administered 2014-03-21: 1000 mL

## 2014-03-21 MED ORDER — BUPIVACAINE HCL 0.25 % IJ SOLN
INTRAMUSCULAR | Status: DC | PRN
  Administered 2014-03-21: 30 mL

## 2014-03-21 MED ORDER — GLYCOPYRROLATE 0.2 MG/ML IJ SOLN
INTRAMUSCULAR | Status: DC | PRN
  Administered 2014-03-21: 0.6 mg via INTRAVENOUS
  Administered 2014-03-21: 0.2 mg via INTRAVENOUS

## 2014-03-21 MED ORDER — NEOSTIGMINE METHYLSULFATE 10 MG/10ML IV SOLN
INTRAVENOUS | Status: DC | PRN
  Administered 2014-03-21: 4 mg via INTRAVENOUS

## 2014-03-21 MED ORDER — HYDROMORPHONE HCL PF 1 MG/ML IJ SOLN: 0.2500 mg | INTRAMUSCULAR | Status: DC | PRN

## 2014-03-21 MED ORDER — MENTHOL 3 MG MT LOZG
LOZENGE | OROMUCOSAL | Status: DC
Start: 2014-03-21 — End: 2014-03-21
  Filled 2014-03-21: qty 9

## 2014-03-21 MED ORDER — SODIUM CHLORIDE 0.9 % IV SOLN
INTRAVENOUS | Status: DC | PRN
  Administered 2014-03-21: 10:00:00

## 2014-03-21 SURGICAL SUPPLY — 51 items
APPLIER CLIP 5 13 M/L LIGAMAX5 (MISCELLANEOUS)
APPLIER CLIP ROT 10 11.4 M/L (STAPLE) ×3
BLADE SURG ROTATE 9660 (MISCELLANEOUS) IMPLANT
CANISTER SUCTION 2500CC (MISCELLANEOUS) ×3 IMPLANT
CHLORAPREP W/TINT 26ML (MISCELLANEOUS) ×3 IMPLANT
CHOLANGIOGRAM CATH TAUT (CATHETERS) ×3 IMPLANT
CLIP APPLIE 5 13 M/L LIGAMAX5 (MISCELLANEOUS) IMPLANT
CLIP APPLIE ROT 10 11.4 M/L (STAPLE) ×1 IMPLANT
COVER MAYO STAND STRL (DRAPES) ×3 IMPLANT
COVER SURGICAL LIGHT HANDLE (MISCELLANEOUS) ×3 IMPLANT
DERMABOND ADVANCED (GAUZE/BANDAGES/DRESSINGS)
DERMABOND ADVANCED .7 DNX12 (GAUZE/BANDAGES/DRESSINGS) IMPLANT
DRAPE C-ARM 42X72 X-RAY (DRAPES) ×3 IMPLANT
DRAPE PROXIMA HALF (DRAPES) ×3 IMPLANT
DRAPE UTILITY 15X26 W/TAPE STR (DRAPE) ×6 IMPLANT
ELECT REM PT RETURN 9FT ADLT (ELECTROSURGICAL) ×3
ELECTRODE REM PT RTRN 9FT ADLT (ELECTROSURGICAL) ×1 IMPLANT
FILTER SMOKE EVAC LAPAROSHD (FILTER) ×3 IMPLANT
GLOVE BIO SURGEON STRL SZ7 (GLOVE) ×3 IMPLANT
GLOVE BIO SURGEON STRL SZ7.5 (GLOVE) ×3 IMPLANT
GLOVE BIOGEL PI IND STRL 6.5 (GLOVE) ×2 IMPLANT
GLOVE BIOGEL PI IND STRL 7.0 (GLOVE) ×1 IMPLANT
GLOVE BIOGEL PI IND STRL 7.5 (GLOVE) ×2 IMPLANT
GLOVE BIOGEL PI INDICATOR 6.5 (GLOVE) ×4
GLOVE BIOGEL PI INDICATOR 7.0 (GLOVE) ×2
GLOVE BIOGEL PI INDICATOR 7.5 (GLOVE) ×4
GLOVE ECLIPSE 7.0 STRL STRAW (GLOVE) ×3 IMPLANT
GLOVE SURG SIGNA 7.5 PF LTX (GLOVE) ×3 IMPLANT
GOWN STRL REUS W/ TWL LRG LVL3 (GOWN DISPOSABLE) ×4 IMPLANT
GOWN STRL REUS W/ TWL XL LVL3 (GOWN DISPOSABLE) ×1 IMPLANT
GOWN STRL REUS W/TWL LRG LVL3 (GOWN DISPOSABLE) ×8
GOWN STRL REUS W/TWL XL LVL3 (GOWN DISPOSABLE) ×2
IV CATH 14GX2 1/4 (CATHETERS) ×3 IMPLANT
KIT BASIN OR (CUSTOM PROCEDURE TRAY) ×3 IMPLANT
KIT ROOM TURNOVER OR (KITS) ×3 IMPLANT
NS IRRIG 1000ML POUR BTL (IV SOLUTION) ×3 IMPLANT
PAD ARMBOARD 7.5X6 YLW CONV (MISCELLANEOUS) ×3 IMPLANT
POUCH SPECIMEN RETRIEVAL 10MM (ENDOMECHANICALS) ×3 IMPLANT
SCISSORS LAP 5X35 DISP (ENDOMECHANICALS) ×3 IMPLANT
SET IRRIG TUBING LAPAROSCOPIC (IRRIGATION / IRRIGATOR) ×3 IMPLANT
SLEEVE ENDOPATH XCEL 5M (ENDOMECHANICALS) ×3 IMPLANT
SPECIMEN JAR SMALL (MISCELLANEOUS) ×3 IMPLANT
STOPCOCK 4 WAY LG BORE MALE ST (IV SETS) ×3 IMPLANT
SUT MON AB 5-0 PS2 18 (SUTURE) ×3 IMPLANT
TOWEL OR 17X24 6PK STRL BLUE (TOWEL DISPOSABLE) ×3 IMPLANT
TOWEL OR 17X26 10 PK STRL BLUE (TOWEL DISPOSABLE) ×3 IMPLANT
TRAY LAPAROSCOPIC (CUSTOM PROCEDURE TRAY) ×3 IMPLANT
TROCAR XCEL BLUNT TIP 100MML (ENDOMECHANICALS) ×3 IMPLANT
TROCAR XCEL NON-BLD 11X100MML (ENDOMECHANICALS) ×3 IMPLANT
TROCAR XCEL NON-BLD 5MMX100MML (ENDOMECHANICALS) ×3 IMPLANT
TUBING EXTENTION W/L.L. (IV SETS) ×3 IMPLANT

## 2014-03-21 NOTE — Anesthesia Preprocedure Evaluation (Addendum)
Anesthesia Evaluation  Patient identified by MRN, date of birth, ID band Patient awake    Reviewed: Allergy & Precautions, H&P , NPO status , Patient's Chart, lab work & pertinent test results, reviewed documented beta blocker date and time   Airway Mallampati: III TM Distance: >3 FB     Dental  (+) Teeth Intact, Dental Advisory Given   Pulmonary  breath sounds clear to auscultation        Cardiovascular hypertension, Pt. on medications and Pt. on home beta blockers + CAD + dysrhythmias Rhythm:Regular     Neuro/Psych Anxiety Depression    GI/Hepatic hiatal hernia, GERD-  Medicated and Controlled,  Endo/Other  diabetes  Renal/GU      Musculoskeletal   Abdominal (+)  Abdomen: soft. Bowel sounds: normal.  Peds  Hematology   Anesthesia Other Findings   Reproductive/Obstetrics                         Anesthesia Physical Anesthesia Plan  ASA: II  Anesthesia Plan: General   Post-op Pain Management:    Induction: Intravenous  Airway Management Planned: Oral ETT  Additional Equipment:   Intra-op Plan:   Post-operative Plan: Extubation in OR  Informed Consent: I have reviewed the patients History and Physical, chart, labs and discussed the procedure including the risks, benefits and alternatives for the proposed anesthesia with the patient or authorized representative who has indicated his/her understanding and acceptance.     Plan Discussed with: CRNA, Anesthesiologist and Surgeon  Anesthesia Plan Comments:         Anesthesia Quick Evaluation

## 2014-03-21 NOTE — Transfer of Care (Signed)
Immediate Anesthesia Transfer of Care Note  Patient: Misty Smith  Procedure(s) Performed: Procedure(s): LAPAROSCOPIC CHOLECYSTECTOMY WITH INTRAOPERATIVE CHOLANGIOGRAM (N/A)  Patient Location: PACU  Anesthesia Type:General  Level of Consciousness: awake, alert  and oriented  Airway & Oxygen Therapy: Patient connected to face mask oxygen  Post-op Assessment: Report given to PACU RN  Post vital signs: stable  Complications: No apparent anesthesia complications

## 2014-03-21 NOTE — H&P (View-Only) (Signed)
Re:   ARAMIS ZOBEL DOB:   30-Oct-1940 MRN:   [id number]  ASSESSMENT AND PLAN: 1.  Gall bladder disease, gall stones  I discussed with the patient the indications and risks of gall bladder surgery.  The primary risks of gall bladder surgery include, but are not limited to, bleeding, infection, common bile duct injury, and open surgery.  There is also the risk that the patient may have continued symptoms after surgery.  However, the likelihood of improvement in symptoms and return to the patient's normal status is good. We discussed the typical post-operative recovery course. I tried to answer the patient's questions.  I gave the patient literature about gall bladder surgery.  Her husband had common bile duct stones.  She cannot remember who in our group did his surgery.  We will schedule her for surgery.  2.  Paratracheal lesion seen on CT scan - plan repeat CT scan next year. 3.  Small hiatal hernia 4.  Anxiety/stress 5.  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 6.  HTN 7.  Lupus  Chief Complaint  Patient presents with  . eval gallstones   REFERRING PHYSICIAN: Redge Gainer, MD  HISTORY OF PRESENT ILLNESS: YOBANA CULLITON is a 73 y.o. (DOB: 1941-07-02) white  female whose primary care physician is Redge Gainer, MD and comes to me today for Gall bladder disease.  Ms Roemmich has had symptoms for some time. She has had discomfort in her epigastrium and right upper quadrant. This can occur as much as 3 times a week, but sometimes skips weeks. She has some nausea but no vomiting, no fever, but she'll have an urgency to go have a bowel movement. She had a recent CT scan, which showed the gall stones, which lead her to being referred to our office. Her son had gall bladder surgery.  He is the only one in the family to have gall bladder problems.  Her husband had CBD stones and did well with surgery.  CT scan - 12/16/2103 - 1. Similar appearance, over 2 years, of innumerable  basilar predominant lung nodules. Most consistent with a post infectious or inflammatory etiology.  2. A right paratracheal partially calcified lesion is similar versus a minimally enlarged when compared back to 12/07/2012. Favored to represent an exophytic or accessory thyroid tissue/nodule. Consider followup with chest CT at 1 year to confirm 2 years of stability.  3. Atherosclerosis, including within the coronary arteries.  4. Small hiatal hernia  5. Cholelithiasis  We talked about the surgery.  She wants to go ahead before she has more symptoms.  Sthe does not want to have the trouble the her husband had.   Past Medical History  Diagnosis Date  . Anxiety   . Stress   . Colovaginal fistula   . Hypertension   . Postmenopausal HRT (hormone replacement therapy)   . Hyperlipidemia   . Perimenopausal vasomotor symptoms   . Esophageal stricture   . Subacute lupus erythematosus     Dr. Sol Blazing    . Pre-diabetes   . Fractured elbow 1970's      Past Surgical History  Procedure Laterality Date  . Abdominal bypass  1987    fibroids   . Bilateral tubal ligtation  1972  . Tonsillectomy    . Abdominal hysterectomy      partial  . Elbow Right     surgery after fracture of elbow      Current Outpatient Prescriptions  Medication Sig Dispense Refill  .  ALPRAZolam (XANAX) 0.25 MG tablet Take 0.25 mg by mouth at bedtime as needed.        Marland Kitchen aspirin EC 81 MG tablet Take 1 tablet (81 mg total) by mouth daily.  90 tablet  3  . atorvastatin (LIPITOR) 80 MG tablet TAKE 1 TABLET BY MOUTH DAILY  90 tablet  1  . Calcium Carb-Cholecalciferol (CALTRATE 600+D) 600-800 MG-UNIT TABS Take 1 tablet by mouth every evening.       . cholecalciferol (VITAMIN D) 1000 UNITS tablet Take 2,000 Units by mouth daily.      . clobetasol (TEMOVATE) 0.05 % external solution Apply 1 application topically 2 (two) times daily.      Marland Kitchen desonide (DESOWEN) 0.05 % cream Apply 1 application topically 2 (two) times daily.        Marland Kitchen escitalopram (LEXAPRO) 20 MG tablet TAKE 1 TABLET EVERY DAY AS DIRECTED  90 tablet  0  . estradiol (ESTRACE VAGINAL) 0.1 MG/GM vaginal cream Place 2 g vaginally daily. For 3 weeks         . Halcinonide (HALOG) 0.1 % CREA Apply 1 application topically 2 (two) times daily.      Marland Kitchen lisinopril (PRINIVIL,ZESTRIL) 20 MG tablet TAKE 1 TABLET (20 MG TOTAL) BY MOUTH DAILY.  90 tablet  1  . meloxicam (MOBIC) 15 MG tablet Take 15 mg by mouth as needed.       . metoprolol tartrate (LOPRESSOR) 25 MG tablet 1 tab po BID      . Multiple Vitamin (MULTIVITAMIN) tablet Take 1 tablet by mouth every morning.       Marland Kitchen NEXIUM 40 MG capsule TAKE 1 CAPSULE BY MOUTH ONCE A DAY  90 capsule  0  . omega-3 acid ethyl esters (LOVAZA) 1 G capsule TAKE 2 CAPSULES BY MOUTH TWICE A DAY  360 capsule  1  . pioglitazone (ACTOS) 30 MG tablet TAKE 1 TABLET (30 MG TOTAL) BY MOUTH DAILY.  90 tablet  1   No current facility-administered medications for this visit.    Allergies  Allergen Reactions  . Ciprofloxacin Rash  . Codeine Rash  . Iohexol      Desc: PT HAD HIVES AND WAS GIVEN BENEDRYL PO BY NURSE AND OBSERVED IN NURSES STATION AND RELEASED WITHOUT AND OTHER COMPLICATIONS SHEET SCANNED UNDER NURSES NOTES///ON 12/01/11 PT HAD ANOTHER CT WITH PREMEDS MINUS THE BENADRYL. SHE HAD A DELAYED REACTION AFTER WITH THROAT TIGHTNESS AND SOB, JB  12/07/12   . Penicillins   . Sulfonamide Derivatives   . Asa [Aspirin] Other (See Comments)    Large amounts causes stomach pain  . Keflex [Cephalexin] Nausea And Vomiting    REVIEW OF SYSTEMS: Skin:  No history of rash.  No history of abnormal moles. Infection:  No history of hepatitis or HIV.  No history of MRSA. Neurologic:  No history of stroke.  No history of seizure.  No history of headaches. Cardiac:  HTN x 20 years.  Sees Dr. Stanford Breed annually.  She had a cath in the 1990's.  She has had stress test and echos since then. Pulmonary:  Does not smoke cigarettes.  No asthma or  bronchitis.  No OSA/CPAP.  Endocrine:  No diabetes. Possible thyroid nodule on CT scan. Gastrointestinal:  GERD on nexium.  No history of liver disease.  No history of gall bladder disease.  No history of pancreas disease.  Colonoscopy by Dr. Watt Climes in 2009. Urologic:  No history of kidney stones.  No history of bladder infections. Musculoskeletal:  Knee problems, has had injections in her knees.  Dr. Alvan Dame Hematologic:  No bleeding disorder.  No history of anemia.  Not anticoagulated. Psycho-social:  The patient is oriented.   The patient has no obvious psychologic or social impairment to understanding our conversation and plan.  SOCIAL and FAMILY HISTORY: Married. Though her husband has some memory problems. She has 4 children:  52, 49,46, and 42. Her daughter works in the radiology dept at [employer]. Her son had his gall bladder out.  PHYSICAL EXAM: BP 126/74  Pulse 73  Temp(Src) 97.5 F (36.4 C)  Ht 5\' 1"  (1.549 m)  Wt 172 lb (78.019 kg)  BMI 32.52 kg/m2  General: Obese WF who is alert and generally healthy appearing.  HEENT: Normal. Pupils equal. Neck: Supple. No mass.  No thyroid mass. Lymph Nodes:  No supraclavicular or cervical nodes. Lungs: Clear to auscultation and symmetric breath sounds. Heart:  RRR. No murmur or rub. Abdomen: Soft. No mass. No tenderness. No hernia. Normal bowel sounds.  Pfannenstiel scar. Rectal: Not done. Extremities:  Good strength and ROM  in upper and lower extremities. Neurologic:  Grossly intact to motor and sensory function. Psychiatric: Has normal mood and affect. Behavior is normal.   DATA REVIEWED: Notes in Epic. I gave her a copy of her CT scan report.  Alphonsa Overall, MD,  [facility] Surgery, [address]  [place], [place]    [place] Phone:  [phone] FAX:  [phone]

## 2014-03-21 NOTE — Anesthesia Postprocedure Evaluation (Signed)
  Anesthesia Post-op Note  Patient: Misty Smith  Procedure(s) Performed: Procedure(s): LAPAROSCOPIC CHOLECYSTECTOMY WITH INTRAOPERATIVE CHOLANGIOGRAM (N/A)  Patient Location: PACU  Anesthesia Type:General  Level of Consciousness: awake, alert , oriented and patient cooperative  Airway and Oxygen Therapy: Patient Spontanous Breathing  Post-op Pain: mild  Post-op Assessment: Post-op Vital signs reviewed, Patient's Cardiovascular Status Stable, Respiratory Function Stable, Patent Airway and No signs of Nausea or vomiting  Post-op Vital Signs: stable  Last Vitals:  Filed Vitals:   03/21/14 1330  BP: 118/61  Pulse: 56  Temp:   Resp: 16    Complications: No apparent anesthesia complications

## 2014-03-21 NOTE — Anesthesia Procedure Notes (Addendum)
Procedure Name: Intubation Date/Time: 03/21/2014 9:39 AM Performed by: Maeola Harman Pre-anesthesia Checklist: Patient identified, Emergency Drugs available, Suction available and Patient being monitored Patient Re-evaluated:Patient Re-evaluated prior to inductionOxygen Delivery Method: Circle system utilized Preoxygenation: Pre-oxygenation with 100% oxygen Intubation Type: IV induction Ventilation: Mask ventilation without difficulty Laryngoscope Size: Mac and 3 Grade View: Grade II Tube type: Oral Tube size: 7.5 mm Number of attempts: 1 Airway Equipment and Method: Stylet Placement Confirmation: ETT inserted through vocal cords under direct vision,  positive ETCO2 and breath sounds checked- equal and bilateral Secured at: 22 cm Tube secured with: Tape Dental Injury: Teeth and Oropharynx as per pre-operative assessment  Comments: Easy induction and intubation, Grade II view noted.  Dr. Tamala Julian verified placement.  Waldron Session, CRNA

## 2014-03-21 NOTE — Interval H&P Note (Signed)
History and Physical Interval Note:  03/21/2014 9:22 AM  Misty Smith  has presented today for surgery, with the diagnosis of gall bladder disease  The various methods of treatment have been discussed with the patient and family.  Two of her daughters and her husband are with her.  We'll see how she does to decide whether she will go home tonight.  After consideration of risks, benefits and other options for treatment, the patient has consented to  Procedure(s): LAPAROSCOPIC CHOLECYSTECTOMY WITH INTRAOPERATIVE CHOLANGIOGRAM POSSIBLE OPEN (N/A) as a surgical intervention .  The patient's history has been reviewed, patient examined, no change in status, stable for surgery.  I have reviewed the patient's chart and labs.  Questions were answered to the patient's satisfaction.     Chavela Justiniano H

## 2014-03-21 NOTE — Discharge Instructions (Signed)
[facility] - DISCHARGE INSTRUCTIONS TO PATIENT  Activity:  Driving - May drive in 3 or 4 days, if doing well.   Lifting - No lifting more than 15 pounds for 1 week, then there is not limit  Wound Care:   May shower on Thursday, 7/23.  Diet:  As tolerated.  Follow up appointment:  Call Dr. Pollie Friar office [facility]) at [phone] for an appointment in 2 to 3 weeks.  Medications and dosages:  Resume your home medications.  You have a prescription for:  Vicodin  Call Dr. Lucia Gaskins or his office  [phone]) if you have:  Temperature greater than 100.4,  Persistent nausea and vomiting,  Severe uncontrolled pain,  Redness, tenderness, or signs of infection (pain, swelling, redness, odor or green/yellow discharge around the site),  Difficulty breathing, headache or visual disturbances,  Any other questions or concerns you may have after discharge.  In an emergency, call 911 or go to an Emergency Department at a nearby hospital.   What to eat:  For your first meals, you should eat lightly; only small meals initially.  If you do not have nausea, you may eat larger meals.  Avoid spicy, greasy and heavy food.    General Anesthesia, Adult, Care After  Refer to this sheet in the next few weeks. These instructions provide you with information on caring for yourself after your procedure. Your health care provider may also give you more specific instructions. Your treatment has been planned according to current medical practices, but problems sometimes occur. Call your health care provider if you have any problems or questions after your procedure.  WHAT TO EXPECT AFTER THE PROCEDURE  After the procedure, it is typical to experience:  Sleepiness.  Nausea and vomiting. HOME CARE INSTRUCTIONS  For the first 24 hours after general anesthesia:  Have a responsible person with you.  Do not drive a car. If you are alone, do not take public transportation.  Do not drink  alcohol.  Do not take medicine that has not been prescribed by your health care provider.  Do not sign important papers or make important decisions.  You may resume a normal diet and activities as directed by your health care provider.  Change bandages (dressings) as directed.  If you have questions or problems that seem related to general anesthesia, call the hospital and ask for the anesthetist or anesthesiologist on call. SEEK MEDICAL CARE IF:  You have nausea and vomiting that continue the day after anesthesia.  You develop a rash. SEEK IMMEDIATE MEDICAL CARE IF:  You have difficulty breathing.  You have chest pain.  You have any allergic problems. Document Released: 11/24/2000 Document Revised: 04/20/2013 Document Reviewed: 03/03/2013  [facility] Patient Information 2014 [place], [place].

## 2014-03-21 NOTE — Op Note (Signed)
03/21/2014  11:01 AM  PATIENT:  Misty Smith, 73 y.o., female, MRN: [id number]  PREOP DIAGNOSIS:  GALLBLADDER DISEASE  POSTOP DIAGNOSIS:   Chronic cholecystitis, cholelithiasis, impacted cystic duct stones x 2   [photo in chart]  PROCEDURE:   Procedure(s):  LAPAROSCOPIC CHOLECYSTECTOMY WITH INTRAOPERATIVE CHOLANGIOGRAM  SURGEON:   Alphonsa Overall, M.D.  ASSISTANT:   Jonny Ruiz, M.D.  ANESTHESIA:   general  Anesthesiologist: Kate Sable, MD CRNA: Maeola Harman, CRNA  General  ASA: 2  EBL:  Minimal  ml  BLOOD ADMINISTERED: none  DRAINS: none   LOCAL MEDICATIONS USED:   30 cc 1/4% marcaine  SPECIMEN:   Gall bladder  COUNTS CORRECT:  YES  INDICATIONS FOR PROCEDURE:  Misty Smith is a 73 y.o. (DOB: September 30, 1940) white  female whose primary care physician is Redge Gainer, MD and comes for cholecystectomy.   The indications and risks of the gall bladder surgery were explained to the patient.  The risks include, but are not limited to, infection, bleeding, common bile duct injury and open surgery.  SURGERY:  The patient was taken to room #2 at [facility].  The abdomen was prepped with chloroprep.  The patient was given no antibiotics prior to surgery.   A time out was held and the surgical checklist run.   An infraumbilical incision was made into the abdominal cavity.  A 12 mm Hasson trocar was inserted into the abdominal cavity through the infraumbilical incision and secured with a 0 Vicryl suture.  Three additional trocars were inserted: a 10 mm trocar in the sub-xiphoid location, a 5 mm trocar in the right mid subcostal area, and a 5 mm trocar in the right lateral subcostal area.   The abdomen was explored and the liver, stomach, and bowel that could be seen were unremarkable.  She did have a lot of omental fat.  And her liver/gall bladder were high.   The gall bladder was identified, grasped, and rotated cephalad.  Disssection was carried down to the gall  bladder/cystic duct junction and the cystic duct isolated.  A clip was placed on the gall bladder side of the cystic duct.   An intra-operative cholangiogram was shot.   The intra-operative cholangiogram was shot using a cut off Taut catheter placed through a 14 gauge angiocath in the RUQ.  The Taut catheter was inserted in the cut cystic duct and secured with an endoclip.  A cholangiogram was shot with 10 cc of 1/2 strength Omnipaque.  Using fluoroscopy, the cholangiogram showed the flow of contrast into the common bile duct, up the hepatic radicals, and into the duodenum.  There was no mass or obstruction.  This was a normal intra-operative cholangiogram.   The Taut catheter was removed.  The cystic duct was tripley endoclipped and the cystic artery was identified and clipped.  The gall bladder was bluntly and sharpley dissected from the gall bladder bed.   After the gall bladder was removed from the liver, the gall bladder bed and [place] were inspected.  There was no bleeding or bile leak.  The gall bladder was placed in a endocatch bag and delivered through the umbilicus.  The abdomen was irrigated with 1,600 cc saline.   The trocars were then removed.  I infiltrated 30 cc of 1/4% Marcaine into the incisions.  The umbilical port closed with a 0 Vicryl suture and the skin closed with 5-0 vicryl.  The skin was painted with Dermabond.  The patient's sponge  and needle count were correct.  The patient was transported to the [facility] in good condition.   She will try to go home today.  Alphonsa Overall, MD, [facility] Pager: [phone] Office phone:  [phone]

## 2014-03-22 ENCOUNTER — Telehealth (INDEPENDENT_AMBULATORY_CARE_PROVIDER_SITE_OTHER): Payer: Self-pay

## 2014-03-22 NOTE — Telephone Encounter (Signed)
Pt home doing well. Pt advised will send request to Dr Pollie Friar nurse to call pt with po appt time.

## 2014-03-23 ENCOUNTER — Encounter (HOSPITAL_COMMUNITY): Payer: Self-pay | Admitting: Surgery

## 2014-04-02 ENCOUNTER — Other Ambulatory Visit: Payer: Self-pay | Admitting: Family Medicine

## 2014-04-04 NOTE — Telephone Encounter (Signed)
Please change directions to 1 in the morning and 2 in the Pm, #90

## 2014-04-04 NOTE — Telephone Encounter (Signed)
Directions in last noet were for BID but refill request for one in am and two pm. Please review.

## 2014-04-13 ENCOUNTER — Other Ambulatory Visit: Payer: Self-pay | Admitting: Family Medicine

## 2014-04-17 DIAGNOSIS — L570 Actinic keratosis: Secondary | ICD-10-CM | POA: Diagnosis not present

## 2014-04-17 DIAGNOSIS — L930 Discoid lupus erythematosus: Secondary | ICD-10-CM | POA: Diagnosis not present

## 2014-04-17 DIAGNOSIS — L820 Inflamed seborrheic keratosis: Secondary | ICD-10-CM | POA: Diagnosis not present

## 2014-04-17 DIAGNOSIS — L821 Other seborrheic keratosis: Secondary | ICD-10-CM | POA: Diagnosis not present

## 2014-04-19 ENCOUNTER — Encounter (INDEPENDENT_AMBULATORY_CARE_PROVIDER_SITE_OTHER): Payer: Self-pay | Admitting: Surgery

## 2014-04-19 ENCOUNTER — Encounter (INDEPENDENT_AMBULATORY_CARE_PROVIDER_SITE_OTHER): Payer: Medicare Other | Admitting: Surgery

## 2014-04-19 ENCOUNTER — Ambulatory Visit (INDEPENDENT_AMBULATORY_CARE_PROVIDER_SITE_OTHER): Payer: Medicare Other | Admitting: Surgery

## 2014-04-19 VITALS — BP 124/68 | HR 70 | Temp 98.0°F | Resp 18 | Ht 62.0 in | Wt 170.0 lb

## 2014-04-19 DIAGNOSIS — K829 Disease of gallbladder, unspecified: Secondary | ICD-10-CM

## 2014-04-19 NOTE — Progress Notes (Signed)
Re:   Misty Smith DOB:   [birth year] MRN:   [id number]  ASSESSMENT AND PLAN: 1.  Lap chole with IOC - 03/21/2014 - D. Ardyce Heyer  Gall bladder disease, gall stones  She has done well.  Her return appt is PRN   2.  Paratracheal lesion seen on CT scan - plan repeat CT scan next year.  I gave her a copy of the CT scan.  (I had given her a copy las time I saw her also.) 3.  Small hiatal hernia 4.  Anxiety/stress 5.  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 6.  HTN 7.  Lupus  Chief Complaint  Patient presents with  . Routine Post Op    lap chole   REFERRING PHYSICIAN: Redge Gainer, MD  HISTORY OF PRESENT ILLNESS: Misty Smith is a 73 y.o. (DOB: 1940-09-14) white  female whose primary care physician is Redge Gainer, MD and comes to me today for follow up of gall bladder surgery. She is by herself. She is doing well.  No complaint.  I gave her a copy of her CT scan.  Gall bladder disease history (03/2014): Ms [place] has had symptoms for some time. She has had discomfort in her epigastrium and right upper quadrant. This can occur as much as 3 times a week, but sometimes skips weeks. She has some nausea but no vomiting, no fever, but she'll have an urgency to go have a bowel movement. She had a recent CT scan, which showed the gall stones, which lead her to being referred to our office. Her son had gall bladder surgery.  He is the only one in the family to have gall bladder problems.  Her husband had CBD stones and did well with surgery.  CT scan - 12/16/2103 - 1. Similar appearance, over 2 years, of innumerable basilar predominant lung nodules. Most consistent with a post infectious or inflammatory etiology.  2. A right paratracheal partially calcified lesion is similar versus a minimally enlarged when compared back to 12/07/2012. Favored to represent an exophytic or accessory thyroid tissue/nodule. Consider followup with chest CT at 1 year to confirm 2 years of stability.    3. Atherosclerosis, including within the coronary arteries.  4. Small hiatal hernia  5. Cholelithiasis  We talked about the surgery.  She wants to go ahead before she has more symptoms.  Sthe does not want to have the trouble the her husband had.   Past Medical History  Diagnosis Date  . Anxiety   . Stress   . Colovaginal fistula   . Hypertension   . Postmenopausal HRT (hormone replacement therapy)   . Hyperlipidemia   . Perimenopausal vasomotor symptoms   . Esophageal stricture   . Subacute lupus erythematosus     Dr. Sol Blazing    . Pre-diabetes   . Fractured elbow 1970's  . Bronchitis   . Diabetes mellitus without complication   . Depression   . GERD (gastroesophageal reflux disease)   . H/O hiatal hernia   . Arthritis   . Dysrhythmia     palpitations, occasional PVCs; Dr Stanford Breed cardiologist      Past Surgical History  Procedure Laterality Date  . Abdominal bypass  1987    fibroids   . Bilateral tubal ligtation  1972  . Tonsillectomy    . Abdominal hysterectomy      partial  . Elbow Right     surgery after fracture of elbow  . Tubal ligation    .  Colonoscopy    . Cholecystectomy N/A 03/21/2014    Procedure: LAPAROSCOPIC CHOLECYSTECTOMY WITH INTRAOPERATIVE CHOLANGIOGRAM;  Surgeon: Shann Medal, MD;  Location: [facility];  Service: General;  Laterality: N/A;      Current Outpatient Prescriptions  Medication Sig Dispense Refill  . ALPRAZolam (XANAX) 0.25 MG tablet Take 0.25 mg by mouth at bedtime as needed for anxiety.       Marland Kitchen aspirin EC 81 MG tablet Take 1 tablet (81 mg total) by mouth daily.  90 tablet  3  . atorvastatin (LIPITOR) 80 MG tablet Take 40 mg by mouth daily.      . Calcium Carb-Cholecalciferol (CALTRATE 600+D) 600-800 MG-UNIT TABS Take 1 tablet by mouth every evening.       . cholecalciferol (VITAMIN D) 1000 UNITS tablet Take 2,000 Units by mouth daily.      . clobetasol (TEMOVATE) 0.05 % external solution Apply 1 application topically 2 (two) times  daily.      Marland Kitchen desonide (DESOWEN) 0.05 % cream Apply 1 application topically 2 (two) times daily as needed (for rash).       Marland Kitchen escitalopram (LEXAPRO) 20 MG tablet TAKE 1 TABLET EVERY DAY AS DIRECTED  90 tablet  0  . esomeprazole (NEXIUM) 40 MG capsule Take 40 mg by mouth daily at 12 noon.      Marland Kitchen estradiol (ESTRACE VAGINAL) 0.1 MG/GM vaginal cream Place 2 g vaginally See admin instructions. Place 2 g vaginally 2-3 times weekly.      . Halcinonide (HALOG) 0.1 % CREA Apply 1 application topically 2 (two) times daily.      Marland Kitchen lisinopril (PRINIVIL,ZESTRIL) 20 MG tablet Take 20 mg by mouth daily.      Marland Kitchen lisinopril (PRINIVIL,ZESTRIL) 20 MG tablet TAKE 1 TABLET (20 MG TOTAL) BY MOUTH DAILY.  90 tablet  1  . meloxicam (MOBIC) 15 MG tablet Take 15 mg by mouth daily as needed for pain.       . metoprolol tartrate (LOPRESSOR) 25 MG tablet TAKE ONE TABLET (25MG ) BY MOUTH IN THE AM AND TWO TABLETS (50MG ) BY MOUTH IN THE PM.  270 tablet  0  . Multiple Vitamin (MULTIVITAMIN) tablet Take 1 tablet by mouth every morning.       Marland Kitchen NEXIUM 40 MG capsule TAKE 1 CAPSULE BY MOUTH ONCE A DAY  90 capsule  0  . omega-3 acid ethyl esters (LOVAZA) 1 G capsule Take 2 g by mouth 2 (two) times daily.      . pioglitazone (ACTOS) 30 MG tablet Take 30 mg by mouth daily.      . pioglitazone (ACTOS) 30 MG tablet TAKE 1 TABLET (30 MG TOTAL) BY MOUTH DAILY.  90 tablet  1  . HYDROcodone-acetaminophen (NORCO/VICODIN) 5-325 MG per tablet Take 1-2 tablets by mouth every 6 (six) hours as needed.  30 tablet  0   No current facility-administered medications for this visit.    Allergies  Allergen Reactions  . Ciprofloxacin Rash  . Codeine Rash  . Iohexol      Desc: PT HAD HIVES AND WAS GIVEN BENEDRYL PO BY NURSE AND OBSERVED IN NURSES STATION AND RELEASED WITHOUT AND OTHER COMPLICATIONS SHEET SCANNED UNDER NURSES NOTES///ON 12/01/11 PT HAD ANOTHER CT WITH PREMEDS MINUS THE BENADRYL. SHE HAD A DELAYED REACTION AFTER WITH THROAT TIGHTNESS AND  SOB, JB  12/07/12   . Penicillins Hives  . Sulfonamide Derivatives Hives  . Asa [Aspirin] Other (See Comments)    Large amounts causes stomach pain  .  Keflex [Cephalexin] Nausea And Vomiting  . Morphine And Related Rash    REVIEW OF SYSTEMS: Skin:  No history of rash.  No history of abnormal moles. Infection:  No history of hepatitis or HIV.  No history of MRSA. Neurologic:  No history of stroke.  No history of seizure.  No history of headaches. Cardiac:  HTN x 20 years.  Sees Dr. Stanford Breed annually.  She had a cath in the 1990's.  She has had stress test and echos since then. Pulmonary:  Does not smoke cigarettes.  No asthma or bronchitis.  No OSA/CPAP.  Endocrine:  No diabetes. Possible thyroid nodule on CT scan. Gastrointestinal:  GERD on nexium.  No history of liver disease.  No history of gall bladder disease.  No history of pancreas disease.  Colonoscopy by Dr. Watt Climes in 2009. Urologic:  No history of kidney stones.  No history of bladder infections. Musculoskeletal:  Knee problems, has had injections in her knees.  Dr. Alvan Dame Hematologic:  No bleeding disorder.  No history of anemia.  Not anticoagulated. Psycho-social:  The patient is oriented.   The patient has no obvious psychologic or social impairment to understanding our conversation and plan.  SOCIAL and FAMILY HISTORY: Married. Though her husband has some memory problems. She has 4 children:  52, 49,46, and 42. Her daughter works in the radiology dept at [employer]. Her son had his gall bladder out.  PHYSICAL EXAM: BP 124/68  Pulse 70  Temp(Src) 98 F (36.7 C)  Resp 18  Ht 5\' 2"  (1.575 m)  Wt 170 lb (77.111 kg)  BMI 31.09 kg/m2  General: Obese WF who is alert and generally healthy appearing.  HEENT: Normal. Pupils equal. Neck: Supple. No mass.  No thyroid mass. Lymph Nodes:  No supraclavicular or cervical nodes. Lungs: Clear to auscultation and symmetric breath sounds. Heart:  RRR. No murmur or rub. Abdomen:  Soft.Normal bowel sounds.  Pfannenstiel scar.  Incisions look good.  I removed a suture from the infraumbilical wound.  DATA REVIEWED: I gave her her path report.  Alphonsa Overall, MD,  [facility], [address]  [place], [place]    [place] Phone:  [phone] FAX:  [phone]

## 2014-05-12 ENCOUNTER — Encounter: Payer: Self-pay | Admitting: *Deleted

## 2014-05-16 ENCOUNTER — Other Ambulatory Visit (INDEPENDENT_AMBULATORY_CARE_PROVIDER_SITE_OTHER): Payer: Medicare Other

## 2014-05-16 DIAGNOSIS — R5381 Other malaise: Secondary | ICD-10-CM

## 2014-05-16 DIAGNOSIS — E8881 Metabolic syndrome: Secondary | ICD-10-CM

## 2014-05-16 DIAGNOSIS — R5383 Other fatigue: Secondary | ICD-10-CM

## 2014-05-16 DIAGNOSIS — E559 Vitamin D deficiency, unspecified: Secondary | ICD-10-CM

## 2014-05-16 DIAGNOSIS — R7309 Other abnormal glucose: Secondary | ICD-10-CM | POA: Diagnosis not present

## 2014-05-16 DIAGNOSIS — E785 Hyperlipidemia, unspecified: Secondary | ICD-10-CM

## 2014-05-16 DIAGNOSIS — R7303 Prediabetes: Secondary | ICD-10-CM

## 2014-05-16 DIAGNOSIS — F411 Generalized anxiety disorder: Secondary | ICD-10-CM

## 2014-05-16 LAB — POCT CBC
Granulocyte percent: 59 %G (ref 37–80)
HCT, POC: 38.8 % (ref 37.7–47.9)
HEMOGLOBIN: 12.4 g/dL (ref 12.2–16.2)
LYMPH, POC: 1.9 (ref 0.6–3.4)
MCH, POC: 30.5 pg (ref 27–31.2)
MCHC: 31.9 g/dL (ref 31.8–35.4)
MCV: 95.3 fL (ref 80–97)
MPV: 8.5 fL (ref 0–99.8)
PLATELET COUNT, POC: 227 10*3/uL (ref 142–424)
POC Granulocyte: 3.1 (ref 2–6.9)
POC LYMPH PERCENT: 35 %L (ref 10–50)
RBC: 4.1 M/uL (ref 4.04–5.48)
RDW, POC: 13 %
WBC: 5.3 10*3/uL (ref 4.6–10.2)

## 2014-05-16 LAB — POCT GLYCOSYLATED HEMOGLOBIN (HGB A1C): Hemoglobin A1C: 5.6

## 2014-05-17 LAB — NMR, LIPOPROFILE
Cholesterol: 140 mg/dL (ref 100–199)
HDL Cholesterol by NMR: 53 mg/dL (ref >39)
HDL Particle Number: 31.6 umol/L (ref >=30.5)
LDL PARTICLE NUMBER: 762 nmol/L (ref <1000)
LDL Size: 20.6 nm (ref >20.5)
LDLC SERPL CALC-MCNC: 62 mg/dL (ref 0–99)
LP-IR SCORE: 34 (ref <=45)
Small LDL Particle Number: 251 nmol/L (ref <=527)
Triglycerides by NMR: 126 mg/dL (ref 0–149)

## 2014-05-17 LAB — HEPATIC FUNCTION PANEL
ALBUMIN: 4 g/dL (ref 3.5–4.8)
ALT: 59 IU/L — AB (ref 0–32)
AST: 59 IU/L — AB (ref 0–40)
Alkaline Phosphatase: 80 IU/L (ref 39–117)
Bilirubin, Direct: 0.14 mg/dL (ref 0.00–0.40)
Total Bilirubin: 0.5 mg/dL (ref 0.0–1.2)
Total Protein: 6.9 g/dL (ref 6.0–8.5)

## 2014-05-17 LAB — BMP8+EGFR
BUN/Creatinine Ratio: 15 (ref 11–26)
BUN: 11 mg/dL (ref 8–27)
CHLORIDE: 97 mmol/L (ref 97–108)
CO2: 28 mmol/L (ref 18–29)
Calcium: 9.9 mg/dL (ref 8.7–10.3)
Creatinine, Ser: 0.71 mg/dL (ref 0.57–1.00)
GFR calc Af Amer: 96 mL/min/{1.73_m2} (ref >59)
GFR calc non Af Amer: 84 mL/min/{1.73_m2} (ref >59)
Glucose: 111 mg/dL — ABNORMAL HIGH (ref 65–99)
POTASSIUM: 5.1 mmol/L (ref 3.5–5.2)
SODIUM: 138 mmol/L (ref 134–144)

## 2014-05-17 LAB — VITAMIN D 25 HYDROXY (VIT D DEFICIENCY, FRACTURES): Vit D, 25-Hydroxy: 61.6 ng/mL (ref 30.0–100.0)

## 2014-05-18 ENCOUNTER — Telehealth: Payer: Self-pay | Admitting: Family Medicine

## 2014-05-18 NOTE — Telephone Encounter (Signed)
Patient aware.

## 2014-05-18 NOTE — Telephone Encounter (Signed)
Message copied by Waverly Ferrari on Thu May 18, 2014 10:24 AM ------      Message from: Chipper Herb      Created: Wed May 17, 2014  2:38 PM       The blood sugar is slightly elevated at 111. The creatinine, the most important kidney function test is within normal limits. The electrolytes including potassium are within normal limit      The liver function tests remain slightly elevated but these are consistent with past readings. No change in current treatment.      The vitamin D level is good at 61.6. Continue current treatment      All cholesterol numbers with advanced lipid testing including triglycerides are excellent and at goal. Continue current treatment and as aggressive therapeutic lifestyle changes as possible ------

## 2014-05-20 ENCOUNTER — Other Ambulatory Visit: Payer: Self-pay | Admitting: Family Medicine

## 2014-05-22 ENCOUNTER — Telehealth: Payer: Self-pay | Admitting: *Deleted

## 2014-05-22 NOTE — Telephone Encounter (Signed)
Message copied by Rolena Infante on Mon May 22, 2014 11:37 AM ------      Message from: Chipper Herb      Created: Wed May 17, 2014  2:38 PM       The blood sugar is slightly elevated at 111. The creatinine, the most important kidney function test is within normal limits. The electrolytes including potassium are within normal limit      The liver function tests remain slightly elevated but these are consistent with past readings. No change in current treatment.      The vitamin D level is good at 61.6. Continue current treatment      All cholesterol numbers with advanced lipid testing including triglycerides are excellent and at goal. Continue current treatment and as aggressive therapeutic lifestyle changes as possible ------

## 2014-05-22 NOTE — Telephone Encounter (Signed)
Notified of lab results and husbands lab results per chart

## 2014-05-22 NOTE — Telephone Encounter (Signed)
Message copied by Rolena Infante on Mon May 22, 2014 11:33 AM ------      Message from: Chipper Herb      Created: Wed May 17, 2014  2:38 PM       The blood sugar is slightly elevated at 111. The creatinine, the most important kidney function test is within normal limits. The electrolytes including potassium are within normal limit      The liver function tests remain slightly elevated but these are consistent with past readings. No change in current treatment.      The vitamin D level is good at 61.6. Continue current treatment      All cholesterol numbers with advanced lipid testing including triglycerides are excellent and at goal. Continue current treatment and as aggressive therapeutic lifestyle changes as possible ------

## 2014-05-22 NOTE — Telephone Encounter (Signed)
Patient notified of lab results and notified of husbands results per chart

## 2014-05-23 ENCOUNTER — Ambulatory Visit: Payer: Medicare Other | Admitting: Family Medicine

## 2014-05-25 ENCOUNTER — Encounter: Payer: Self-pay | Admitting: Family Medicine

## 2014-05-25 ENCOUNTER — Ambulatory Visit (INDEPENDENT_AMBULATORY_CARE_PROVIDER_SITE_OTHER): Payer: Medicare Other | Admitting: Family Medicine

## 2014-05-25 VITALS — BP 131/60 | HR 52 | Temp 97.4°F | Ht 62.0 in | Wt 173.0 lb

## 2014-05-25 DIAGNOSIS — R10819 Abdominal tenderness, unspecified site: Secondary | ICD-10-CM

## 2014-05-25 DIAGNOSIS — I10 Essential (primary) hypertension: Secondary | ICD-10-CM | POA: Diagnosis not present

## 2014-05-25 DIAGNOSIS — E785 Hyperlipidemia, unspecified: Secondary | ICD-10-CM

## 2014-05-25 DIAGNOSIS — I2510 Atherosclerotic heart disease of native coronary artery without angina pectoris: Secondary | ICD-10-CM

## 2014-05-25 DIAGNOSIS — R7303 Prediabetes: Secondary | ICD-10-CM

## 2014-05-25 DIAGNOSIS — Z9049 Acquired absence of other specified parts of digestive tract: Secondary | ICD-10-CM

## 2014-05-25 DIAGNOSIS — R7309 Other abnormal glucose: Secondary | ICD-10-CM | POA: Diagnosis not present

## 2014-05-25 DIAGNOSIS — K219 Gastro-esophageal reflux disease without esophagitis: Secondary | ICD-10-CM

## 2014-05-25 DIAGNOSIS — R1013 Epigastric pain: Secondary | ICD-10-CM

## 2014-05-25 DIAGNOSIS — Z9089 Acquired absence of other organs: Secondary | ICD-10-CM

## 2014-05-25 DIAGNOSIS — J301 Allergic rhinitis due to pollen: Secondary | ICD-10-CM

## 2014-05-25 NOTE — Progress Notes (Signed)
Subjective:    Patient ID: Misty Smith, female    DOB: 1941/07/21, 73 y.o.   MRN: [id number]  HPI Pt here for follow up and management of chronic medical problems. The patient complains of some upper abdominal pain. She has had a recent cholecystectomy.Recent lab work was reviewed with the patient .        Patient Active Problem List   Diagnosis Date Noted  . 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 CAD (coronary artery disease) 12/30/2013  . 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 Osteopenia of the elderly 10/26/2013  . Generalized anxiety disorder 09/21/2013  . GERD (gastroesophageal reflux disease) 09/21/2013  . Pulmonary nodule seen on imaging study 09/21/2013  . SYNCOPE 02/02/2009  . Pre-diabetes 02/01/2009  . HYPERLIPIDEMIA 02/01/2009  . HYPERTENSION 02/01/2009  . DIZZINESS 02/01/2009  . PALPITATIONS 02/01/2009   Outpatient Encounter Prescriptions as of 05/25/2014  Medication Sig  . ALPRAZolam (XANAX) 0.25 MG tablet Take 0.25 mg by mouth at bedtime as needed for anxiety.   Marland Kitchen aspirin EC 81 MG tablet Take 1 tablet (81 mg total) by mouth daily.  Marland Kitchen atorvastatin (LIPITOR) 80 MG tablet Take as directed  . Calcium Carb-Cholecalciferol (CALTRATE 600+D) 600-800 MG-UNIT TABS Take 1 tablet by mouth every evening.   . cholecalciferol (VITAMIN D) 1000 UNITS tablet Take 2,000 Units by mouth daily.  . clobetasol (TEMOVATE) 0.05 % external solution Apply 1 application topically 2 (two) times daily.  Marland Kitchen desonide (DESOWEN) 0.05 % cream Apply 1 application topically 2 (two) times daily as needed (for rash).   Marland Kitchen escitalopram (LEXAPRO) 20 MG tablet TAKE 1 TABLET EVERY DAY AS DIRECTED  . esomeprazole (NEXIUM) 40 MG capsule Take 40 mg by mouth daily at 12 noon.  Marland Kitchen estradiol (ESTRACE VAGINAL) 0.1 MG/GM vaginal cream Place 2 g vaginally See admin instructions. Place 2 g vaginally 2-3 times weekly.  . Halcinonide (HALOG) 0.1 % CREA Apply 1 application topically 2 (two) times daily.  Marland Kitchen  lisinopril (PRINIVIL,ZESTRIL) 20 MG tablet TAKE 1 TABLET (20 MG TOTAL) BY MOUTH DAILY.  . metoprolol tartrate (LOPRESSOR) 25 MG tablet TAKE ONE TABLET (25MG ) BY MOUTH IN THE AM AND one TABLET (25MG ) BY MOUTH IN THE PM.  . Multiple Vitamin (MULTIVITAMIN) tablet Take 1 tablet by mouth every morning.   Marland Kitchen NEXIUM 40 MG capsule TAKE 1 CAPSULE BY MOUTH ONCE A DAY  . omega-3 acid ethyl esters (LOVAZA) 1 G capsule Take 2 g by mouth 2 (two) times daily.  . pioglitazone (ACTOS) 30 MG tablet TAKE 1 TABLET (30 MG TOTAL) BY MOUTH DAILY.  . [DISCONTINUED] atorvastatin (LIPITOR) 80 MG tablet Take 40 mg by mouth daily.  . [DISCONTINUED] HYDROcodone-acetaminophen (NORCO/VICODIN) 5-325 MG per tablet Take 1-2 tablets by mouth every 6 (six) hours as needed.  . [DISCONTINUED] lisinopril (PRINIVIL,ZESTRIL) 20 MG tablet Take 20 mg by mouth daily.  . [DISCONTINUED] metoprolol tartrate (LOPRESSOR) 25 MG tablet TAKE ONE TABLET (25MG ) BY MOUTH IN THE AM AND TWO TABLETS (50MG ) BY MOUTH IN THE PM.  . meloxicam (MOBIC) 15 MG tablet Take 15 mg by mouth daily as needed for pain.   . [DISCONTINUED] pioglitazone (ACTOS) 30 MG tablet Take 30 mg by mouth daily.    Review of Systems  Constitutional: Negative.   HENT: Negative.   Eyes: Negative.   Respiratory: Negative.   Cardiovascular: Negative.   Gastrointestinal: Negative.        Lingering upper gi pain  Endocrine: Negative.   Genitourinary: Negative.   Musculoskeletal: Negative.  Skin: Negative.   Allergic/Immunologic: Negative.   Neurological: Negative.   Hematological: Negative.   Psychiatric/Behavioral: Negative.        Objective:   Physical Exam  Nursing note and vitals reviewed. Constitutional: She is oriented to person, place, and time. She appears well-developed and well-nourished. No distress.  HENT:  Head: Normocephalic and atraumatic.  Right Ear: External ear normal.  Left Ear: External ear normal.  Mouth/Throat: Oropharynx is clear and moist. No  oropharyngeal exudate.  Nasal congestion and turbinate swelling  Eyes: Conjunctivae and EOM are normal. Pupils are equal, round, and reactive to light. Right eye exhibits no discharge. Left eye exhibits no discharge. No scleral icterus.  Neck: Normal range of motion. Neck supple. No thyromegaly present.  Cardiovascular: Normal rate, regular rhythm, normal heart sounds and intact distal pulses.  Exam reveals no gallop and no friction rub.   No murmur heard. At 60 per minute  Pulmonary/Chest: Effort normal and breath sounds normal. No respiratory distress. She has no wheezes. She has no rales. She exhibits no tenderness.  Abdominal: Soft. Bowel sounds are normal. She exhibits no mass. There is tenderness. There is no rebound and no guarding.  Generalized abdominal tenderness  Musculoskeletal: Normal range of motion. She exhibits no edema and no tenderness.  Lymphadenopathy:    She has no cervical adenopathy.  Neurological: She is alert and oriented to person, place, and time. She has normal reflexes. No cranial nerve deficit.  Skin: Skin is warm and dry. No rash noted.  Psychiatric: She has a normal mood and affect. Her behavior is normal. Judgment and thought content normal.   BP 131/60  Pulse 52  Temp(Src) 97.4 F (36.3 C) (Oral)  Ht 5\' 2"  (1.575 m)  Wt 173 lb (78.472 kg)  BMI 31.63 kg/m2        Assessment & Plan:  1. Gastroesophageal reflux disease, esophagitis presence not specified  2. HYPERLIPIDEMIA  3. HYPERTENSION  4. Pre-diabetes  5. Epigastric burning sensation  6. History of cholecystectomy  7. Abdominal tenderness  8. Allergic rhinitis due to pollen  Patient Instructions                       Medicare Annual Wellness Visit  [facility] and the medical providers at [facility] strive to bring you the best medical care.  In doing so we not only want to address your current medical conditions and concerns but also to detect new conditions  early and prevent illness, disease and health-related problems.    Medicare offers a yearly Wellness Visit which allows our clinical staff to assess your need for preventative services including immunizations, lifestyle education, counseling to decrease risk of preventable diseases and screening for fall risk and other medical concerns.    This visit is provided free of charge (no copay) for all Medicare recipients. The clinical pharmacists at [facility] have begun to conduct these Wellness Visits which will also include a thorough review of all your medications.    As you primary medical provider recommend that you make an appointment for your Annual Wellness Visit if you have not done so already this year.  You may set up this appointment before you leave today or you may call back ([phone]) and schedule an appointment.  Please make sure when you call that you mention that you are scheduling your Annual Wellness Visit with the clinical pharmacist so that the appointment may be made for the proper  length of time.     Continue current medications. Continue good therapeutic lifestyle changes which include good diet and exercise. Fall precautions discussed with patient. If an FOBT was given today- please return it to our front desk. If you are over 89 years old - you may need Prevnar 42 or the adult Pneumonia vaccine.  Flu Shots will be available at our office starting mid- September. Please call and schedule a FLU CLINIC APPOINTMENT.   Zantac 150 before supper Continue nexium in the mornings prior to 1st meal Call us back in 2 weeks if stomach is not getting better----and we will make a referral to the gastroenterology for possible endoscopy Continue to monitor blood pressures at home Return  the FOBT Continue to use nose spray on a regular basis for allergy Keep appointment for pelvic exam   Arrie Senate MD

## 2014-05-25 NOTE — Patient Instructions (Addendum)
Medicare Annual Wellness Visit  [facility] and the medical providers at [facility] strive to bring you the best medical care.  In doing so we not only want to address your current medical conditions and concerns but also to detect new conditions early and prevent illness, disease and health-related problems.    Medicare offers a yearly Wellness Visit which allows our clinical staff to assess your need for preventative services including immunizations, lifestyle education, counseling to decrease risk of preventable diseases and screening for fall risk and other medical concerns.    This visit is provided free of charge (no copay) for all Medicare recipients. The clinical pharmacists at [facility] have begun to conduct these Wellness Visits which will also include a thorough review of all your medications.    As you primary medical provider recommend that you make an appointment for your Annual Wellness Visit if you have not done so already this year.  You may set up this appointment before you leave today or you may call back ([phone]) and schedule an appointment.  Please make sure when you call that you mention that you are scheduling your Annual Wellness Visit with the clinical pharmacist so that the appointment may be made for the proper length of time.     Continue current medications. Continue good therapeutic lifestyle changes which include good diet and exercise. Fall precautions discussed with patient. If an FOBT was given today- please return it to our front desk. If you are over 97 years old - you may need Prevnar 58 or the adult Pneumonia vaccine.  Flu Shots will be available at our office starting mid- September. Please call and schedule a FLU CLINIC APPOINTMENT.   Zantac 150 before supper Continue nexium in the mornings prior to 1st meal Call us back in 2 weeks if stomach is not getting better----and we will make  a referral to the gastroenterology for possible endoscopy Continue to monitor blood pressures at home Return  the FOBT Continue to use nose spray on a regular basis for allergy Keep appointment for pelvic exam

## 2014-05-29 ENCOUNTER — Other Ambulatory Visit: Payer: Medicare Other

## 2014-05-29 DIAGNOSIS — Z1212 Encounter for screening for malignant neoplasm of rectum: Secondary | ICD-10-CM

## 2014-05-30 LAB — FECAL OCCULT BLOOD, IMMUNOCHEMICAL: FECAL OCCULT BLD: NEGATIVE

## 2014-06-13 DIAGNOSIS — M1712 Unilateral primary osteoarthritis, left knee: Secondary | ICD-10-CM | POA: Diagnosis not present

## 2014-06-13 DIAGNOSIS — M25562 Pain in left knee: Secondary | ICD-10-CM | POA: Diagnosis not present

## 2014-06-13 DIAGNOSIS — M7061 Trochanteric bursitis, right hip: Secondary | ICD-10-CM | POA: Diagnosis not present

## 2014-06-14 ENCOUNTER — Ambulatory Visit (INDEPENDENT_AMBULATORY_CARE_PROVIDER_SITE_OTHER): Payer: Medicare Other

## 2014-06-14 DIAGNOSIS — Z23 Encounter for immunization: Secondary | ICD-10-CM

## 2014-06-19 ENCOUNTER — Ambulatory Visit: Payer: Medicare Other | Attending: Orthopedic Surgery | Admitting: Physical Therapy

## 2014-06-19 DIAGNOSIS — I10 Essential (primary) hypertension: Secondary | ICD-10-CM | POA: Diagnosis not present

## 2014-06-19 DIAGNOSIS — M061 Adult-onset Still's disease: Secondary | ICD-10-CM | POA: Insufficient documentation

## 2014-06-19 DIAGNOSIS — Z5189 Encounter for other specified aftercare: Secondary | ICD-10-CM | POA: Insufficient documentation

## 2014-06-19 DIAGNOSIS — M25559 Pain in unspecified hip: Secondary | ICD-10-CM | POA: Diagnosis not present

## 2014-06-19 DIAGNOSIS — E119 Type 2 diabetes mellitus without complications: Secondary | ICD-10-CM | POA: Diagnosis not present

## 2014-06-22 ENCOUNTER — Ambulatory Visit: Payer: Medicare Other | Admitting: *Deleted

## 2014-06-22 DIAGNOSIS — E119 Type 2 diabetes mellitus without complications: Secondary | ICD-10-CM | POA: Diagnosis not present

## 2014-06-22 DIAGNOSIS — M061 Adult-onset Still's disease: Secondary | ICD-10-CM | POA: Diagnosis not present

## 2014-06-22 DIAGNOSIS — I10 Essential (primary) hypertension: Secondary | ICD-10-CM | POA: Diagnosis not present

## 2014-06-22 DIAGNOSIS — M25559 Pain in unspecified hip: Secondary | ICD-10-CM | POA: Diagnosis not present

## 2014-06-22 DIAGNOSIS — Z5189 Encounter for other specified aftercare: Secondary | ICD-10-CM | POA: Diagnosis not present

## 2014-06-26 ENCOUNTER — Ambulatory Visit: Payer: Medicare Other | Admitting: Physical Therapy

## 2014-06-26 DIAGNOSIS — M061 Adult-onset Still's disease: Secondary | ICD-10-CM | POA: Diagnosis not present

## 2014-06-26 DIAGNOSIS — I10 Essential (primary) hypertension: Secondary | ICD-10-CM | POA: Diagnosis not present

## 2014-06-26 DIAGNOSIS — M25559 Pain in unspecified hip: Secondary | ICD-10-CM | POA: Diagnosis not present

## 2014-06-26 DIAGNOSIS — Z5189 Encounter for other specified aftercare: Secondary | ICD-10-CM | POA: Diagnosis not present

## 2014-06-26 DIAGNOSIS — E119 Type 2 diabetes mellitus without complications: Secondary | ICD-10-CM | POA: Diagnosis not present

## 2014-06-29 ENCOUNTER — Ambulatory Visit: Payer: Medicare Other | Admitting: *Deleted

## 2014-06-29 DIAGNOSIS — Z5189 Encounter for other specified aftercare: Secondary | ICD-10-CM | POA: Diagnosis not present

## 2014-06-29 DIAGNOSIS — I10 Essential (primary) hypertension: Secondary | ICD-10-CM | POA: Diagnosis not present

## 2014-06-29 DIAGNOSIS — M061 Adult-onset Still's disease: Secondary | ICD-10-CM | POA: Diagnosis not present

## 2014-06-29 DIAGNOSIS — M25559 Pain in unspecified hip: Secondary | ICD-10-CM | POA: Diagnosis not present

## 2014-06-29 DIAGNOSIS — E119 Type 2 diabetes mellitus without complications: Secondary | ICD-10-CM | POA: Diagnosis not present

## 2014-07-03 ENCOUNTER — Ambulatory Visit: Payer: Medicare Other | Attending: Orthopedic Surgery | Admitting: Physical Therapy

## 2014-07-03 DIAGNOSIS — Z5189 Encounter for other specified aftercare: Secondary | ICD-10-CM | POA: Diagnosis not present

## 2014-07-03 DIAGNOSIS — E119 Type 2 diabetes mellitus without complications: Secondary | ICD-10-CM | POA: Insufficient documentation

## 2014-07-03 DIAGNOSIS — M25559 Pain in unspecified hip: Secondary | ICD-10-CM | POA: Insufficient documentation

## 2014-07-03 DIAGNOSIS — I10 Essential (primary) hypertension: Secondary | ICD-10-CM | POA: Insufficient documentation

## 2014-07-03 DIAGNOSIS — M061 Adult-onset Still's disease: Secondary | ICD-10-CM | POA: Insufficient documentation

## 2014-07-06 ENCOUNTER — Ambulatory Visit: Payer: Medicare Other | Admitting: *Deleted

## 2014-07-10 ENCOUNTER — Ambulatory Visit: Payer: Medicare Other | Admitting: Physical Therapy

## 2014-07-10 DIAGNOSIS — Z5189 Encounter for other specified aftercare: Secondary | ICD-10-CM | POA: Diagnosis not present

## 2014-07-13 ENCOUNTER — Ambulatory Visit: Payer: Medicare Other | Admitting: *Deleted

## 2014-07-13 DIAGNOSIS — Z5189 Encounter for other specified aftercare: Secondary | ICD-10-CM | POA: Diagnosis not present

## 2014-07-17 DIAGNOSIS — L309 Dermatitis, unspecified: Secondary | ICD-10-CM | POA: Diagnosis not present

## 2014-07-17 DIAGNOSIS — L570 Actinic keratosis: Secondary | ICD-10-CM | POA: Diagnosis not present

## 2014-07-18 ENCOUNTER — Ambulatory Visit: Payer: Medicare Other | Admitting: *Deleted

## 2014-07-18 DIAGNOSIS — Z5189 Encounter for other specified aftercare: Secondary | ICD-10-CM | POA: Diagnosis not present

## 2014-07-20 ENCOUNTER — Ambulatory Visit: Payer: Medicare Other | Admitting: *Deleted

## 2014-07-20 DIAGNOSIS — Z5189 Encounter for other specified aftercare: Secondary | ICD-10-CM | POA: Diagnosis not present

## 2014-07-24 ENCOUNTER — Other Ambulatory Visit: Payer: Self-pay | Admitting: Family Medicine

## 2014-07-25 ENCOUNTER — Other Ambulatory Visit: Payer: Self-pay | Admitting: Family Medicine

## 2014-07-26 ENCOUNTER — Other Ambulatory Visit: Payer: Self-pay | Admitting: Family Medicine

## 2014-07-29 ENCOUNTER — Other Ambulatory Visit: Payer: Self-pay | Admitting: Family Medicine

## 2014-08-18 ENCOUNTER — Other Ambulatory Visit: Payer: Self-pay | Admitting: Family Medicine

## 2014-08-19 ENCOUNTER — Other Ambulatory Visit: Payer: Self-pay | Admitting: Family Medicine

## 2014-08-22 DIAGNOSIS — H40013 Open angle with borderline findings, low risk, bilateral: Secondary | ICD-10-CM | POA: Diagnosis not present

## 2014-08-22 DIAGNOSIS — D3132 Benign neoplasm of left choroid: Secondary | ICD-10-CM | POA: Diagnosis not present

## 2014-08-22 DIAGNOSIS — H2513 Age-related nuclear cataract, bilateral: Secondary | ICD-10-CM | POA: Diagnosis not present

## 2014-08-22 DIAGNOSIS — H04123 Dry eye syndrome of bilateral lacrimal glands: Secondary | ICD-10-CM | POA: Diagnosis not present

## 2014-08-22 DIAGNOSIS — E119 Type 2 diabetes mellitus without complications: Secondary | ICD-10-CM | POA: Diagnosis not present

## 2014-08-22 LAB — HM DIABETES EYE EXAM

## 2014-08-28 ENCOUNTER — Encounter: Payer: Self-pay | Admitting: *Deleted

## 2014-09-13 DIAGNOSIS — H2511 Age-related nuclear cataract, right eye: Secondary | ICD-10-CM | POA: Diagnosis not present

## 2014-09-13 DIAGNOSIS — H2512 Age-related nuclear cataract, left eye: Secondary | ICD-10-CM | POA: Diagnosis not present

## 2014-09-13 DIAGNOSIS — D3132 Benign neoplasm of left choroid: Secondary | ICD-10-CM | POA: Diagnosis not present

## 2014-09-14 ENCOUNTER — Other Ambulatory Visit: Payer: Self-pay | Admitting: Family Medicine

## 2014-09-27 ENCOUNTER — Other Ambulatory Visit (INDEPENDENT_AMBULATORY_CARE_PROVIDER_SITE_OTHER): Payer: Medicare Other

## 2014-09-27 DIAGNOSIS — E8881 Metabolic syndrome: Secondary | ICD-10-CM | POA: Diagnosis not present

## 2014-09-27 DIAGNOSIS — E119 Type 2 diabetes mellitus without complications: Secondary | ICD-10-CM

## 2014-09-27 DIAGNOSIS — R1010 Upper abdominal pain, unspecified: Secondary | ICD-10-CM | POA: Diagnosis not present

## 2014-09-27 DIAGNOSIS — K2100 Gastro-esophageal reflux disease with esophagitis, without bleeding: Secondary | ICD-10-CM

## 2014-09-27 DIAGNOSIS — G8929 Other chronic pain: Secondary | ICD-10-CM

## 2014-09-27 DIAGNOSIS — I10 Essential (primary) hypertension: Secondary | ICD-10-CM | POA: Diagnosis not present

## 2014-09-27 DIAGNOSIS — R1011 Right upper quadrant pain: Secondary | ICD-10-CM

## 2014-09-27 DIAGNOSIS — E559 Vitamin D deficiency, unspecified: Secondary | ICD-10-CM

## 2014-09-27 DIAGNOSIS — E785 Hyperlipidemia, unspecified: Secondary | ICD-10-CM

## 2014-09-27 LAB — POCT GLYCOSYLATED HEMOGLOBIN (HGB A1C): Hemoglobin A1C: 6

## 2014-09-27 NOTE — Addendum Note (Signed)
Addended by: Selmer Dominion on: 09/27/2014 01:36 PM   Modules accepted: Orders

## 2014-09-27 NOTE — Progress Notes (Signed)
Lab only 

## 2014-09-28 LAB — HEPATIC FUNCTION PANEL
ALK PHOS: 71 IU/L (ref 39–117)
ALT: 43 IU/L — ABNORMAL HIGH (ref 0–32)
AST: 42 IU/L — ABNORMAL HIGH (ref 0–40)
Albumin: 4 g/dL (ref 3.5–4.8)
BILIRUBIN DIRECT: 0.13 mg/dL (ref 0.00–0.40)
Total Bilirubin: 0.5 mg/dL (ref 0.0–1.2)
Total Protein: 6.8 g/dL (ref 6.0–8.5)

## 2014-09-28 LAB — CBC WITH DIFFERENTIAL
BASOS ABS: 0 10*3/uL (ref 0.0–0.2)
BASOS: 1 %
Eos: 2 %
Eosinophils Absolute: 0.1 10*3/uL (ref 0.0–0.4)
HCT: 39.2 % (ref 34.0–46.6)
HEMOGLOBIN: 13.3 g/dL (ref 11.1–15.9)
IMMATURE GRANULOCYTES: 0 %
Immature Grans (Abs): 0 10*3/uL (ref 0.0–0.1)
Lymphocytes Absolute: 2.1 10*3/uL (ref 0.7–3.1)
Lymphs: 36 %
MCH: 30.9 pg (ref 26.6–33.0)
MCHC: 33.9 g/dL (ref 31.5–35.7)
MCV: 91 fL (ref 79–97)
Monocytes Absolute: 0.7 10*3/uL (ref 0.1–0.9)
Monocytes: 12 %
NEUTROS ABS: 2.9 10*3/uL (ref 1.4–7.0)
NEUTROS PCT: 49 %
RBC: 4.31 x10E6/uL (ref 3.77–5.28)
RDW: 14.9 % (ref 12.3–15.4)
WBC: 5.9 10*3/uL (ref 3.4–10.8)

## 2014-09-28 LAB — NMR, LIPOPROFILE
Cholesterol: 153 mg/dL (ref 100–199)
HDL Cholesterol by NMR: 57 mg/dL (ref >39)
HDL PARTICLE NUMBER: 34.5 umol/L (ref >=30.5)
LDL Particle Number: 1052 nmol/L — ABNORMAL HIGH (ref <1000)
LDL SIZE: 21.2 nm (ref >20.5)
LDL-C: 77 mg/dL (ref 0–99)
LP-IR Score: 34 (ref <=45)
Small LDL Particle Number: 373 nmol/L (ref <=527)
TRIGLYCERIDES BY NMR: 96 mg/dL (ref 0–149)

## 2014-09-28 LAB — BMP8+EGFR
BUN / CREAT RATIO: 18 (ref 11–26)
BUN: 14 mg/dL (ref 8–27)
CALCIUM: 9.6 mg/dL (ref 8.7–10.3)
CHLORIDE: 98 mmol/L (ref 97–108)
CO2: 27 mmol/L (ref 18–29)
Creatinine, Ser: 0.78 mg/dL (ref 0.57–1.00)
GFR calc Af Amer: 86 mL/min/{1.73_m2} (ref >59)
GFR, EST NON AFRICAN AMERICAN: 75 mL/min/{1.73_m2} (ref >59)
Glucose: 100 mg/dL — ABNORMAL HIGH (ref 65–99)
POTASSIUM: 4.8 mmol/L (ref 3.5–5.2)
Sodium: 139 mmol/L (ref 134–144)

## 2014-09-28 LAB — SPECIMEN STATUS REPORT

## 2014-09-28 LAB — PLATELET COUNT: Platelets: 237 10*3/uL (ref 150–379)

## 2014-09-28 LAB — VITAMIN D 25 HYDROXY (VIT D DEFICIENCY, FRACTURES): Vit D, 25-Hydroxy: 51.7 ng/mL (ref 30.0–100.0)

## 2014-10-03 ENCOUNTER — Ambulatory Visit (INDEPENDENT_AMBULATORY_CARE_PROVIDER_SITE_OTHER): Payer: Medicare Other | Admitting: Family Medicine

## 2014-10-03 ENCOUNTER — Encounter: Payer: Self-pay | Admitting: Family Medicine

## 2014-10-03 VITALS — BP 145/64 | HR 59 | Temp 97.1°F | Ht 62.0 in | Wt 174.0 lb

## 2014-10-03 DIAGNOSIS — I10 Essential (primary) hypertension: Secondary | ICD-10-CM | POA: Diagnosis not present

## 2014-10-03 DIAGNOSIS — E041 Nontoxic single thyroid nodule: Secondary | ICD-10-CM | POA: Diagnosis not present

## 2014-10-03 DIAGNOSIS — E785 Hyperlipidemia, unspecified: Secondary | ICD-10-CM

## 2014-10-03 DIAGNOSIS — E118 Type 2 diabetes mellitus with unspecified complications: Secondary | ICD-10-CM

## 2014-10-03 DIAGNOSIS — K219 Gastro-esophageal reflux disease without esophagitis: Secondary | ICD-10-CM

## 2014-10-03 DIAGNOSIS — E559 Vitamin D deficiency, unspecified: Secondary | ICD-10-CM

## 2014-10-03 NOTE — Progress Notes (Signed)
Subjective:    Patient ID: Misty Smith, female    DOB: [birth year], 74 y.o.   MRN: [id number]  HPI Pt here for follow up and management of chronic medical problems which include CAD, hypertension and hyperlipidemia. She is taking all medications regularly. The patient was recently called about her lab work results . The patient has been feeling well with no specific complaints. She has had some dizziness which he felt like was in her ear related and she really hasn't taken anything for this. The lab work results below reviewed with her and recommendations were discussed. The patient realizes that she needs to set up an exam for pelvic exam with one of the providers in our office. Notes Recorded by Chipper Herb, MD on 09/28/2014 at 1:54 PM Platelets are within normal limits Specimen status report ---April Notes Recorded by Koren Bound, Rad Tech on 09/28/2014 at 8:35 AM Pt aware of lab results Notes Recorded by Chipper Herb, MD on 09/28/2014 at 7:08 AM The CBC has a normal white blood cell count. The hemoglobin is good at 13.3. The blood sugar is slightly elevated at 100. The creatinine, the most important kidney function test is within normal limits. The electrolytes including potassium are within normal limits. 2 liver function test remains slightly elevated but not as much as in the past all other tests are normal. The vitamin D level is good at 51.7.----Continue vitamin D3 1000 daily With advanced lipid testing, the total LDL particle number is slightly elevated at 1052. 4 months ago it was 762. Try to do better with diet and exercise and get this number back down to below 1000. The LDL C is good at 77. The triglycerides are good at 96.                  Patient Active Problem List   Diagnosis Date Noted  . 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 CAD (coronary artery disease) 12/30/2013  . 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 Osteopenia of the elderly 10/26/2013  .  Generalized anxiety disorder 09/21/2013  . GERD (gastroesophageal reflux disease) 09/21/2013  . Pulmonary nodule seen on imaging study 09/21/2013  . SYNCOPE 02/02/2009  . Pre-diabetes 02/01/2009  . HYPERLIPIDEMIA 02/01/2009  . HYPERTENSION 02/01/2009  . DIZZINESS 02/01/2009  . PALPITATIONS 02/01/2009   Outpatient Encounter Prescriptions as of 10/03/2014  Medication Sig  . ALPRAZolam (XANAX) 0.25 MG tablet Take 0.25 mg by mouth at bedtime as needed for anxiety.   Marland Kitchen aspirin EC 81 MG tablet Take 1 tablet (81 mg total) by mouth daily.  Marland Kitchen atorvastatin (LIPITOR) 80 MG tablet TAKE AS DIRECTED  . Calcium Carb-Cholecalciferol (CALTRATE 600+D) 600-800 MG-UNIT TABS Take 1 tablet by mouth every evening.   . cholecalciferol (VITAMIN D) 1000 UNITS tablet Take 2,000 Units by mouth daily.  . clobetasol (TEMOVATE) 0.05 % external solution Apply 1 application topically 2 (two) times daily.  Marland Kitchen desonide (DESOWEN) 0.05 % cream Apply 1 application topically 2 (two) times daily as needed (for rash).   Marland Kitchen escitalopram (LEXAPRO) 20 MG tablet TAKE 1 TABLET EVERY DAY AS DIRECTED  . estradiol (ESTRACE VAGINAL) 0.1 MG/GM vaginal cream Place 2 g vaginally See admin instructions. Place 2 g vaginally 2-3 times weekly.  . Halcinonide (HALOG) 0.1 % CREA Apply 1 application topically 2 (two) times daily.  Marland Kitchen lisinopril (PRINIVIL,ZESTRIL) 20 MG tablet TAKE 1 TABLET (20 MG TOTAL) BY MOUTH DAILY.  . meloxicam (MOBIC) 15 MG tablet Take 15 mg by  mouth daily as needed for pain.   . metoprolol tartrate (LOPRESSOR) 25 MG tablet TAKE ONE TABLET (25MG ) BY MOUTH IN THE AM AND one TABLET (25MG ) BY MOUTH IN THE PM.  . Multiple Vitamin (MULTIVITAMIN) tablet Take 1 tablet by mouth every morning.   Marland Kitchen NEXIUM 40 MG capsule TAKE 1 CAPSULE BY MOUTH ONCE A DAY  . omega-3 acid ethyl esters (LOVAZA) 1 G capsule TAKE 2 CAPSULES BY MOUTH TWICE A DAY  . pioglitazone (ACTOS) 30 MG tablet TAKE 1 TABLET (30 MG TOTAL) BY MOUTH DAILY.  . [DISCONTINUED]  metoprolol tartrate (LOPRESSOR) 25 MG tablet TAKE ONE TABLET (25MG ) BY MOUTH IN THE AM AND TWO TABLETS (50MG ) BY MOUTH IN THE PM.  . [DISCONTINUED] omega-3 acid ethyl esters (LOVAZA) 1 G capsule Take 2 g by mouth 2 (two) times daily.    Review of Systems  Constitutional: Negative.   HENT: Negative.   Eyes: Negative.   Respiratory: Negative.   Cardiovascular: Negative.   Gastrointestinal: Negative.   Endocrine: Negative.   Genitourinary: Negative.   Musculoskeletal: Negative.   Skin: Negative.   Allergic/Immunologic: Negative.   Neurological: Negative.   Hematological: Negative.   Psychiatric/Behavioral: Negative.        Objective:   Physical Exam  Constitutional: She is oriented to person, place, and time. She appears well-developed and well-nourished. No distress.  HENT:  Head: Normocephalic and atraumatic.  Right Ear: External ear normal.  Left Ear: External ear normal.  Nose: Nose normal.  Mouth/Throat: Oropharynx is clear and moist. No oropharyngeal exudate.  Eyes: Conjunctivae and EOM are normal. Pupils are equal, round, and reactive to light. Right eye exhibits no discharge. Left eye exhibits no discharge. No scleral icterus.  Neck: Normal range of motion. Neck supple. No thyromegaly present.  Cardiovascular: Normal rate, regular rhythm, normal heart sounds and intact distal pulses.  Exam reveals no gallop and no friction rub.   No murmur heard. At 72/m  Pulmonary/Chest: Effort normal and breath sounds normal. No respiratory distress. She has no wheezes. She has no rales. She exhibits no tenderness.  Abdominal: Soft. Bowel sounds are normal. She exhibits no mass. There is no tenderness. There is no rebound and no guarding.  There is obesity and no inguinal adenopathy  Musculoskeletal: Normal range of motion. She exhibits no edema.  Lymphadenopathy:    She has no cervical adenopathy.  Neurological: She is alert and oriented to person, place, and time. She has normal  reflexes. No cranial nerve deficit.  Skin: Skin is warm and dry. No rash noted. No erythema. No pallor.  Psychiatric: She has a normal mood and affect. Her behavior is normal. Judgment and thought content normal.  Nursing note and vitals reviewed.  BP 145/64 mmHg  Pulse 59  Temp(Src) 97.1 F (36.2 C) (Oral)  Ht 5\' 2"  (1.575 m)  Wt 174 lb (78.926 kg)  BMI 31.82 kg/m2        Assessment & Plan:  1. Gastroesophageal reflux disease, esophagitis presence not specified -The patient should continue her Nexium for this  2. Thyroid nodule  3. Metabolic syndrome -Continue continue exercise regimen and weight loss and diet  4. Hyperlipemia -Try to do better with weight loss and diet and continue current medications  5. Essential hypertension -Blood pressures at home have been good, continue current treatment  6. Vitamin D deficiency -Continue current treatment as vitamin D level is good.  Patient Instructions  Medicare Annual Wellness Visit  [facility] and the medical providers at [facility] strive to bring you the best medical care.  In doing so we not only want to address your current medical conditions and concerns but also to detect new conditions early and prevent illness, disease and health-related problems.    Medicare offers a yearly Wellness Visit which allows our clinical staff to assess your need for preventative services including immunizations, lifestyle education, counseling to decrease risk of preventable diseases and screening for fall risk and other medical concerns.    This visit is provided free of charge (no copay) for all Medicare recipients. The clinical pharmacists at [facility] have begun to conduct these Wellness Visits which will also include a thorough review of all your medications.    As you primary medical provider recommend that you make an appointment for your Annual Wellness Visit if  you have not done so already this year.  You may set up this appointment before you leave today or you may call back ([phone]) and schedule an appointment.  Please make sure when you call that you mention that you are scheduling your Annual Wellness Visit with the clinical pharmacist so that the appointment may be made for the proper length of time.     Continue current medications. Continue good therapeutic lifestyle changes which include good diet and exercise. Fall precautions discussed with patient. If an FOBT was given today- please return it to our front desk. If you are over 65 years old - you may need Prevnar 9 or the adult Pneumonia vaccine.  Flu Shots are still available at our office. If you still haven't had one please call to set up a nurse visit to get one.   After your visit with [place] today you will receive a survey in the mail or online from [employer] regarding your care with [place]. Please take a moment to fill this out. Your feedback is very important to [place] as you can help [place] better understand your patient needs as well as improve your experience and satisfaction. WE CARE ABOUT YOU!!!   Try meclizine 12.5 mg are one half of this 3 times daily with food for a week and see if this helps your dizziness is associated with inner ear. Continue to drink plenty of fluids Try to get back into some exercise regimen when you're in her ear gets better   Arrie Senate MD

## 2014-10-03 NOTE — Patient Instructions (Addendum)
Medicare Annual Wellness Visit  [facility] and the medical providers at [facility] strive to bring you the best medical care.  In doing so we not only want to address your current medical conditions and concerns but also to detect new conditions early and prevent illness, disease and health-related problems.    Medicare offers a yearly Wellness Visit which allows our clinical staff to assess your need for preventative services including immunizations, lifestyle education, counseling to decrease risk of preventable diseases and screening for fall risk and other medical concerns.    This visit is provided free of charge (no copay) for all Medicare recipients. The clinical pharmacists at [facility] have begun to conduct these Wellness Visits which will also include a thorough review of all your medications.    As you primary medical provider recommend that you make an appointment for your Annual Wellness Visit if you have not done so already this year.  You may set up this appointment before you leave today or you may call back ([phone]) and schedule an appointment.  Please make sure when you call that you mention that you are scheduling your Annual Wellness Visit with the clinical pharmacist so that the appointment may be made for the proper length of time.     Continue current medications. Continue good therapeutic lifestyle changes which include good diet and exercise. Fall precautions discussed with patient. If an FOBT was given today- please return it to our front desk. If you are over 46 years old - you may need Prevnar 55 or the adult Pneumonia vaccine.  Flu Shots are still available at our office. If you still haven't had one please call to set up a nurse visit to get one.   After your visit with [place] today you will receive a survey in the mail or online from [employer] regarding your care with [place]. Please take a moment to  fill this out. Your feedback is very important to [place] as you can help [place] better understand your patient needs as well as improve your experience and satisfaction. WE CARE ABOUT YOU!!!   Try meclizine 12.5 mg are one half of this 3 times daily with food for a week and see if this helps your dizziness is associated with inner ear. Continue to drink plenty of fluids Try to get back into some exercise regimen when you're in her ear gets better

## 2014-10-11 ENCOUNTER — Encounter: Payer: Self-pay | Admitting: Cardiology

## 2014-10-24 ENCOUNTER — Other Ambulatory Visit: Payer: Self-pay | Admitting: Family Medicine

## 2014-10-27 DIAGNOSIS — Z1231 Encounter for screening mammogram for malignant neoplasm of breast: Secondary | ICD-10-CM | POA: Diagnosis not present

## 2014-11-13 DIAGNOSIS — N3946 Mixed incontinence: Secondary | ICD-10-CM | POA: Diagnosis not present

## 2014-11-13 DIAGNOSIS — N952 Postmenopausal atrophic vaginitis: Secondary | ICD-10-CM | POA: Diagnosis not present

## 2014-11-13 DIAGNOSIS — N3020 Other chronic cystitis without hematuria: Secondary | ICD-10-CM | POA: Diagnosis not present

## 2014-11-14 ENCOUNTER — Ambulatory Visit (INDEPENDENT_AMBULATORY_CARE_PROVIDER_SITE_OTHER): Payer: Medicare Other | Admitting: Nurse Practitioner

## 2014-11-14 ENCOUNTER — Encounter: Payer: Self-pay | Admitting: Nurse Practitioner

## 2014-11-14 DIAGNOSIS — Z01419 Encounter for gynecological examination (general) (routine) without abnormal findings: Secondary | ICD-10-CM

## 2014-11-14 DIAGNOSIS — R87610 Atypical squamous cells of undetermined significance on cytologic smear of cervix (ASC-US): Secondary | ICD-10-CM | POA: Diagnosis not present

## 2014-11-14 LAB — POCT UA - MICROSCOPIC ONLY
Casts, Ur, LPF, POC: NEGATIVE
Crystals, Ur, HPF, POC: NEGATIVE
YEAST UA: NEGATIVE

## 2014-11-14 LAB — POCT URINALYSIS DIPSTICK
Bilirubin, UA: NEGATIVE
Blood, UA: NEGATIVE
Glucose, UA: NEGATIVE
Ketones, UA: NEGATIVE
Leukocytes, UA: NEGATIVE
Nitrite, UA: NEGATIVE
PROTEIN UA: NEGATIVE
SPEC GRAV UA: 1.01
UROBILINOGEN UA: NEGATIVE
pH, UA: 6

## 2014-11-14 NOTE — Patient Instructions (Signed)
Pap Test A Pap test checks the cells on the surface of your cervix. Your doctor will look for cell changes that are not normal, an infection, or cancer. If the cells no longer look normal, it is called dysplasia. Dysplasia can turn into cancer. Regular Pap tests are important to stop cancer from developing. BEFORE THE PROCEDURE  Ask your doctor when to schedule your Pap test. Timing the test around your period may be important.  Do not douche or have sex (intercourse) for 24 hours before the test.  Do not put creams on your vagina or use tampons for 24 hours before the test.  Go pee (urinate) just before the test. PROCEDURE  You will lie on an exam table with your feet in stirrups.  A warm metal or plastic tool (speculum) will be put in your vagina to open it up.  Your doctor will use a small, plastic brush or wooden spatula to take cells from your cervix.  The cells will be put in a lab container.  The cells will be checked under a microscope to see if they are normal or not. AFTER THE PROCEDURE Get your test results. If they are abnormal, you may need more tests. Document Released: 09/20/2010 Document Revised: 11/10/2011 Document Reviewed: 08/14/2011 ExitCare Patient Information 2015 [employer]. This information is not intended to replace advice given to you by your health care provider. Make sure you discuss any questions you have with your health care provider.  

## 2014-11-14 NOTE — Progress Notes (Signed)
Subjective:    Patient ID: Misty Smith, female    DOB: 1941/03/31, 74 y.o.   MRN: [id number]  HPI Patient is here for CPE with pap. No acute complaint.   Chronic disease mange by Dr. Laurance Flatten.  Patient Active Problem List   Diagnosis Date Noted  . 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 CAD (coronary artery disease) 12/30/2013  . 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 Osteopenia of the elderly 10/26/2013  . Generalized anxiety disorder 09/21/2013  . GERD (gastroesophageal reflux disease) 09/21/2013  . Pulmonary nodule seen on imaging study 09/21/2013  . SYNCOPE 02/02/2009  . Pre-diabetes 02/01/2009  . HYPERLIPIDEMIA 02/01/2009  . HYPERTENSION 02/01/2009  . DIZZINESS 02/01/2009  . PALPITATIONS 02/01/2009    Current Outpatient Prescriptions on File Prior to Visit  Medication Sig Dispense Refill  . ALPRAZolam (XANAX) 0.25 MG tablet Take 0.25 mg by mouth at bedtime as needed for anxiety.     Marland Kitchen aspirin EC 81 MG tablet Take 1 tablet (81 mg total) by mouth daily. 90 tablet 3  . atorvastatin (LIPITOR) 80 MG tablet TAKE AS DIRECTED 90 tablet 0  . Calcium Carb-Cholecalciferol (CALTRATE 600+D) 600-800 MG-UNIT TABS Take 1 tablet by mouth every evening.     . cholecalciferol (VITAMIN D) 1000 UNITS tablet Take 2,000 Units by mouth daily.    . clobetasol (TEMOVATE) 0.05 % external solution Apply 1 application topically 2 (two) times daily.    Marland Kitchen desonide (DESOWEN) 0.05 % cream Apply 1 application topically 2 (two) times daily as needed (for rash).     Marland Kitchen escitalopram (LEXAPRO) 20 MG tablet TAKE 1 TABLET EVERY DAY AS DIRECTED 90 tablet 0  . estradiol (ESTRACE VAGINAL) 0.1 MG/GM vaginal cream Place 2 g vaginally See admin instructions. Place 2 g vaginally 2-3 times weekly.    . Halcinonide (HALOG) 0.1 % CREA Apply 1 application topically 2 (two) times daily.    Marland Kitchen lisinopril (PRINIVIL,ZESTRIL) 20 MG tablet TAKE 1 TABLET (20 MG TOTAL) BY MOUTH DAILY. 90 tablet 0  . meloxicam (MOBIC) 15 MG tablet  Take 15 mg by mouth daily as needed for pain.     . metoprolol tartrate (LOPRESSOR) 25 MG tablet TAKE ONE TABLET (25MG ) BY MOUTH IN THE AM AND one TABLET (25MG ) BY MOUTH IN THE PM.    . metoprolol tartrate (LOPRESSOR) 25 MG tablet TAKE ONE TABLET (25MG ) BY MOUTH IN THE AM AND TWO TABLETS (50MG ) BY MOUTH IN THE PM. 270 tablet 1  . Multiple Vitamin (MULTIVITAMIN) tablet Take 1 tablet by mouth every morning.     Marland Kitchen NEXIUM 40 MG capsule TAKE 1 CAPSULE BY MOUTH ONCE A DAY 90 capsule 0  . omega-3 acid ethyl esters (LOVAZA) 1 G capsule TAKE 2 CAPSULES BY MOUTH TWICE A DAY 360 capsule 1  . pioglitazone (ACTOS) 30 MG tablet TAKE 1 TABLET (30 MG TOTAL) BY MOUTH DAILY. 90 tablet 0   No current facility-administered medications on file prior to visit.    Review of Systems  Constitutional: Negative.   HENT: Negative.   Eyes: Negative.   Respiratory: Negative.   Cardiovascular: Negative.   Gastrointestinal: Negative.   Endocrine: Negative.   Genitourinary: Negative.   Musculoskeletal: Negative.   Skin: Negative.   Neurological: Negative.   Hematological: Negative.   Psychiatric/Behavioral: Negative.        Objective:   Physical Exam  Constitutional: She is oriented to person, place, and time. She appears well-developed and well-nourished.  HENT:  Head: Normocephalic.  Eyes: Conjunctivae  are normal. Pupils are equal, round, and reactive to light.  Neck: Normal range of motion. Neck supple.  Cardiovascular: Normal rate and regular rhythm.   Pulmonary/Chest: Effort normal and breath sounds normal.  Abdominal: Soft. Bowel sounds are normal. She exhibits no mass. There is no tenderness. There is no rebound and no guarding.  Musculoskeletal: Normal range of motion. She exhibits no edema or tenderness.  Neurological: She is alert and oriented to person, place, and time. She has normal reflexes. No cranial nerve deficit.  Skin: Skin is warm. No rash noted. No erythema. No pallor.  Psychiatric: She  has a normal mood and affect. Her behavior is normal. Judgment and thought content normal.    BP 117/57 mmHg  Pulse 57  Temp(Src) 96.7 F (35.9 C) (Oral)  Ht 5\' 2"  (1.575 m)  Wt 175 lb 6.4 oz (79.561 kg)  BMI 32.07 kg/m2  Results for orders placed or performed in visit on 11/14/14  POCT UA - Microscopic Only  Result Value Ref Range   WBC, Ur, HPF, POC 5-10    RBC, urine, microscopic occ    Bacteria, U Microscopic occ    Mucus, UA oc    Epithelial cells, urine per micros occ    Crystals, Ur, HPF, POC neg    Casts, Ur, LPF, POC neg    Yeast, UA neg   POCT urinalysis dipstick  Result Value Ref Range   Color, UA gold    Clarity, UA clear    Glucose, UA neg    Bilirubin, UA neg    Ketones, UA neg    Spec Grav, UA 1.010    Blood, UA neg    pH, UA 6.0    Protein, UA neg    Urobilinogen, UA negative    Nitrite, UA neg    Leukocytes, UA Negative         Assessment & Plan:   1. Encounter for routine gynecological examination    Keep follow up appointments with Dr. Rebbeca Paul prn  Mary-Margaret Hassell Done, FNP

## 2014-11-22 LAB — PAP IG W/ RFLX HPV ASCU: PAP SMEAR COMMENT: 0

## 2014-11-22 LAB — HPV, LOW VOLUME (REFLEX): HPV, LOW VOL REFLEX: NEGATIVE

## 2014-11-22 LAB — HPV DNA PROBE HIGH RISK, AMPLIFIED

## 2014-12-10 ENCOUNTER — Other Ambulatory Visit: Payer: Self-pay | Admitting: Family Medicine

## 2014-12-14 DIAGNOSIS — M25562 Pain in left knee: Secondary | ICD-10-CM | POA: Diagnosis not present

## 2014-12-14 DIAGNOSIS — M7061 Trochanteric bursitis, right hip: Secondary | ICD-10-CM | POA: Diagnosis not present

## 2014-12-14 DIAGNOSIS — M1712 Unilateral primary osteoarthritis, left knee: Secondary | ICD-10-CM | POA: Diagnosis not present

## 2014-12-14 DIAGNOSIS — M791 Myalgia: Secondary | ICD-10-CM | POA: Diagnosis not present

## 2014-12-14 DIAGNOSIS — S39012D Strain of muscle, fascia and tendon of lower back, subsequent encounter: Secondary | ICD-10-CM | POA: Diagnosis not present

## 2014-12-16 ENCOUNTER — Other Ambulatory Visit: Payer: Self-pay | Admitting: Family Medicine

## 2014-12-22 ENCOUNTER — Telehealth: Payer: Self-pay | Admitting: *Deleted

## 2014-12-22 ENCOUNTER — Other Ambulatory Visit: Payer: Self-pay | Admitting: *Deleted

## 2014-12-22 DIAGNOSIS — E0789 Other specified disorders of thyroid: Secondary | ICD-10-CM

## 2014-12-22 DIAGNOSIS — E079 Disorder of thyroid, unspecified: Secondary | ICD-10-CM

## 2014-12-22 NOTE — Progress Notes (Signed)
Order for 2 year follow up Chest CT for right paratracheal thyroid lesion.

## 2014-12-29 ENCOUNTER — Ambulatory Visit
Admission: RE | Admit: 2014-12-29 | Discharge: 2014-12-29 | Disposition: A | Discharge status: Home / Self Care | Payer: Medicare Other | Source: Ambulatory Visit | Attending: Family Medicine | Admitting: Family Medicine

## 2014-12-29 DIAGNOSIS — R918 Other nonspecific abnormal finding of lung field: Secondary | ICD-10-CM | POA: Diagnosis not present

## 2014-12-29 DIAGNOSIS — E079 Disorder of thyroid, unspecified: Secondary | ICD-10-CM

## 2014-12-29 DIAGNOSIS — E0789 Other specified disorders of thyroid: Secondary | ICD-10-CM

## 2014-12-30 NOTE — Progress Notes (Signed)
HPI: FU palpitations and hypertension. Seen in the past secondary to hypertension, hyperlipidemia, and palpitations felt secondary to PVCs. She had a syncopal episode in April of 2010 that we felt was most likely vagal. An echocardiogram showed normal LV function. A Myoview showed an ejection fraction of 80% and normal perfusion. These were performed on February 20, 2009. Chest CT 4/15 showed nodule followed by primary care; also coronary calcification. Since I last saw her, patient occasionally has brief palpitations but denies chest pain or syncope. Mild dyspnea with moderate activities. No orthopnea, PND or pedal edema.  Current Outpatient Prescriptions  Medication Sig Dispense Refill  . ALPRAZolam (XANAX) 0.25 MG tablet Take 0.25 mg by mouth at bedtime as needed for anxiety.     Marland Kitchen aspirin EC 81 MG tablet Take 1 tablet (81 mg total) by mouth daily. 90 tablet 3  . atorvastatin (LIPITOR) 80 MG tablet TAKE AS DIRECTED 90 tablet 0  . Calcium Carb-Cholecalciferol (CALTRATE 600+D) 600-800 MG-UNIT TABS Take 1 tablet by mouth every evening.     . cholecalciferol (VITAMIN D) 1000 UNITS tablet Take 2,000 Units by mouth daily.    . clobetasol (TEMOVATE) 0.05 % external solution Apply 1 application topically 2 (two) times daily.    Marland Kitchen desonide (DESOWEN) 0.05 % cream Apply 1 application topically 2 (two) times daily as needed (for rash).     Marland Kitchen escitalopram (LEXAPRO) 20 MG tablet TAKE 1 TABLET EVERY DAY AS DIRECTED 90 tablet 0  . estradiol (ESTRACE VAGINAL) 0.1 MG/GM vaginal cream Place 2 g vaginally See admin instructions. Place 2 g vaginally 2-3 times weekly.    . Halcinonide (HALOG) 0.1 % CREA Apply 1 application topically 2 (two) times daily.    Marland Kitchen lisinopril (PRINIVIL,ZESTRIL) 20 MG tablet TAKE 1 TABLET (20 MG TOTAL) BY MOUTH DAILY. 90 tablet 0  . metoprolol tartrate (LOPRESSOR) 25 MG tablet TAKE ONE TABLET (25MG ) BY MOUTH IN THE AM AND one TABLET (25MG ) BY MOUTH IN THE PM.    . Multiple Vitamin  (MULTIVITAMIN) tablet Take 1 tablet by mouth every morning.     Marland Kitchen NEXIUM 40 MG capsule TAKE 1 CAPSULE BY MOUTH ONCE A DAY 90 capsule 0  . omega-3 acid ethyl esters (LOVAZA) 1 G capsule TAKE 2 CAPSULES BY MOUTH TWICE A DAY 360 capsule 1  . pioglitazone (ACTOS) 30 MG tablet TAKE 1 TABLET (30 MG TOTAL) BY MOUTH DAILY. 90 tablet 0   No current facility-administered medications for this visit.     Past Medical History  Diagnosis Date  . Anxiety   . Stress   . Colovaginal fistula   . Hypertension   . Postmenopausal HRT (hormone replacement therapy)   . Hyperlipidemia   . Perimenopausal vasomotor symptoms   . Esophageal stricture   . Subacute lupus erythematosus     Dr. Sol Blazing    . Pre-diabetes   . Fractured elbow 1970's  . Bronchitis   . Diabetes mellitus without complication   . Depression   . GERD (gastroesophageal reflux disease)   . H/O hiatal hernia   . Arthritis   . Dysrhythmia     palpitations, occasional PVCs; Dr Stanford Breed cardiologist    Past Surgical History  Procedure Laterality Date  . Abdominal bypass  1987    fibroids   . Bilateral tubal ligtation  1972  . Tonsillectomy    . Abdominal hysterectomy      partial  . Elbow Right     surgery after fracture of  elbow  . Tubal ligation    . Colonoscopy    . Cholecystectomy N/A 03/21/2014    Procedure: LAPAROSCOPIC CHOLECYSTECTOMY WITH INTRAOPERATIVE CHOLANGIOGRAM;  Surgeon: Shann Medal, MD;  Location: [facility];  Service: General;  Laterality: N/A;    History   Social History  . Marital Status: Married    Spouse Name: Milbert Coulter  . Number of Children: N/A  . Years of Education: N/A   Occupational History  . Not on file.   Social History Main Topics  . Smoking status: Never Smoker   . Smokeless tobacco: Never Used  . Alcohol Use: No  . Drug Use: No  . Sexual Activity: No   Other Topics Concern  . Not on file   Social History Narrative    ROS: no fevers or chills, productive cough, hemoptysis, dysphasia,  odynophagia, melena, hematochezia, dysuria, hematuria, rash, seizure activity, orthopnea, PND, pedal edema, claudication. Remaining systems are negative.  Physical Exam: Well-developed well-nourished in no acute distress.  Skin is warm and dry.  HEENT is normal.  Neck is supple.  Chest is clear to auscultation with normal expansion.  Cardiovascular exam is regular rate and rhythm.  Abdominal exam nontender or distended. No masses palpated. Extremities show no edema. neuro grossly intact  ECG sinus bradycardia at a rate of 49. No ST changes.

## 2015-01-02 ENCOUNTER — Ambulatory Visit (INDEPENDENT_AMBULATORY_CARE_PROVIDER_SITE_OTHER): Payer: Medicare Other | Admitting: Cardiology

## 2015-01-02 ENCOUNTER — Encounter: Payer: Self-pay | Admitting: Cardiology

## 2015-01-02 VITALS — BP 130/70 | HR 49 | Ht 61.0 in | Wt 174.0 lb

## 2015-01-02 DIAGNOSIS — R002 Palpitations: Secondary | ICD-10-CM | POA: Diagnosis not present

## 2015-01-02 DIAGNOSIS — I10 Essential (primary) hypertension: Secondary | ICD-10-CM

## 2015-01-02 DIAGNOSIS — I2510 Atherosclerotic heart disease of native coronary artery without angina pectoris: Secondary | ICD-10-CM | POA: Diagnosis not present

## 2015-01-02 NOTE — Patient Instructions (Signed)
Your physician wants you to follow-up in: ONE YEAR WITH DR CRENSHAW You will receive a reminder letter in the mail two months in advance. If you don't receive a letter, please call our office to schedule the follow-up appointment.  

## 2015-01-02 NOTE — Assessment & Plan Note (Signed)
Continue statin. 

## 2015-01-02 NOTE — Assessment & Plan Note (Signed)
Based on calcium on previous CT scan. Continue aspirin and statin.

## 2015-01-02 NOTE — Assessment & Plan Note (Signed)
Continue beta blocker. 

## 2015-01-02 NOTE — Assessment & Plan Note (Signed)
Blood pressure controlled. Continue present medications. 

## 2015-01-15 DIAGNOSIS — D485 Neoplasm of uncertain behavior of skin: Secondary | ICD-10-CM | POA: Diagnosis not present

## 2015-01-15 DIAGNOSIS — L930 Discoid lupus erythematosus: Secondary | ICD-10-CM | POA: Diagnosis not present

## 2015-01-15 DIAGNOSIS — L820 Inflamed seborrheic keratosis: Secondary | ICD-10-CM | POA: Diagnosis not present

## 2015-01-15 DIAGNOSIS — L570 Actinic keratosis: Secondary | ICD-10-CM | POA: Diagnosis not present

## 2015-01-24 ENCOUNTER — Other Ambulatory Visit: Payer: Self-pay | Admitting: Family Medicine

## 2015-01-30 ENCOUNTER — Other Ambulatory Visit (INDEPENDENT_AMBULATORY_CARE_PROVIDER_SITE_OTHER): Payer: Medicare Other

## 2015-01-30 DIAGNOSIS — K219 Gastro-esophageal reflux disease without esophagitis: Secondary | ICD-10-CM

## 2015-01-30 DIAGNOSIS — E118 Type 2 diabetes mellitus with unspecified complications: Secondary | ICD-10-CM | POA: Diagnosis not present

## 2015-01-30 DIAGNOSIS — E559 Vitamin D deficiency, unspecified: Secondary | ICD-10-CM | POA: Diagnosis not present

## 2015-01-30 DIAGNOSIS — I10 Essential (primary) hypertension: Secondary | ICD-10-CM

## 2015-01-30 DIAGNOSIS — E041 Nontoxic single thyroid nodule: Secondary | ICD-10-CM

## 2015-01-30 DIAGNOSIS — E785 Hyperlipidemia, unspecified: Secondary | ICD-10-CM

## 2015-01-30 LAB — POCT CBC
Granulocyte percent: 55.3 %G (ref 37–80)
HEMATOCRIT: 40.5 % (ref 37.7–47.9)
HEMOGLOBIN: 12.6 g/dL (ref 12.2–16.2)
LYMPH, POC: 2.1 (ref 0.6–3.4)
MCH: 28.9 pg (ref 27–31.2)
MCHC: 31.1 g/dL — AB (ref 31.8–35.4)
MCV: 92.7 fL (ref 80–97)
MPV: 9 fL (ref 0–99.8)
POC Granulocyte: 2.9 (ref 2–6.9)
POC LYMPH %: 39.9 % (ref 10–50)
Platelet Count, POC: 213 10*3/uL (ref 142–424)
RBC: 4.36 M/uL (ref 4.04–5.48)
RDW, POC: 15.1 %
WBC: 5.3 10*3/uL (ref 4.6–10.2)

## 2015-01-30 NOTE — Addendum Note (Signed)
Addended by: Zannie Cove on: 01/30/2015 03:21 PM   Modules accepted: Orders

## 2015-01-30 NOTE — Progress Notes (Signed)
Lab only 

## 2015-01-31 LAB — NMR, LIPOPROFILE
CHOLESTEROL: 148 mg/dL (ref 100–199)
HDL Cholesterol by NMR: 54 mg/dL (ref >39)
HDL PARTICLE NUMBER: 34.6 umol/L (ref >=30.5)
LDL PARTICLE NUMBER: 881 nmol/L (ref <1000)
LDL SIZE: 21.5 nm (ref >20.5)
LDL-C: 70 mg/dL (ref 0–99)
LP-IR Score: 30 (ref <=45)
Small LDL Particle Number: 246 nmol/L (ref <=527)
TRIGLYCERIDES BY NMR: 118 mg/dL (ref 0–149)

## 2015-01-31 LAB — THYROID PANEL WITH TSH
Free Thyroxine Index: 2.7 (ref 1.2–4.9)
T3 UPTAKE RATIO: 29 % (ref 24–39)
T4, Total: 9.4 ug/dL (ref 4.5–12.0)
TSH: 2.32 u[IU]/mL (ref 0.450–4.500)

## 2015-01-31 LAB — HEPATIC FUNCTION PANEL
ALK PHOS: 67 IU/L (ref 39–117)
ALT: 41 IU/L — ABNORMAL HIGH (ref 0–32)
AST: 40 IU/L (ref 0–40)
Albumin: 4 g/dL (ref 3.5–4.8)
BILIRUBIN TOTAL: 0.4 mg/dL (ref 0.0–1.2)
Bilirubin, Direct: 0.12 mg/dL (ref 0.00–0.40)
Total Protein: 6.1 g/dL (ref 6.0–8.5)

## 2015-01-31 LAB — VITAMIN D 25 HYDROXY (VIT D DEFICIENCY, FRACTURES): VIT D 25 HYDROXY: 43.9 ng/mL (ref 30.0–100.0)

## 2015-01-31 LAB — BMP8+EGFR
BUN/Creatinine Ratio: 18 (ref 11–26)
BUN: 12 mg/dL (ref 8–27)
CO2: 26 mmol/L (ref 18–29)
Calcium: 9.3 mg/dL (ref 8.7–10.3)
Chloride: 97 mmol/L (ref 97–108)
Creatinine, Ser: 0.68 mg/dL (ref 0.57–1.00)
GFR calc Af Amer: 98 mL/min/{1.73_m2} (ref >59)
GFR calc non Af Amer: 85 mL/min/{1.73_m2} (ref >59)
GLUCOSE: 106 mg/dL — AB (ref 65–99)
POTASSIUM: 4.5 mmol/L (ref 3.5–5.2)
SODIUM: 138 mmol/L (ref 134–144)

## 2015-02-06 ENCOUNTER — Ambulatory Visit (INDEPENDENT_AMBULATORY_CARE_PROVIDER_SITE_OTHER): Payer: Medicare Other | Admitting: Family Medicine

## 2015-02-06 ENCOUNTER — Encounter: Payer: Self-pay | Admitting: Family Medicine

## 2015-02-06 VITALS — BP 121/55 | HR 59 | Temp 97.6°F | Ht 61.0 in | Wt 176.0 lb

## 2015-02-06 DIAGNOSIS — I10 Essential (primary) hypertension: Secondary | ICD-10-CM

## 2015-02-06 DIAGNOSIS — R1310 Dysphagia, unspecified: Secondary | ICD-10-CM | POA: Diagnosis not present

## 2015-02-06 DIAGNOSIS — E041 Nontoxic single thyroid nodule: Secondary | ICD-10-CM | POA: Diagnosis not present

## 2015-02-06 DIAGNOSIS — K219 Gastro-esophageal reflux disease without esophagitis: Secondary | ICD-10-CM | POA: Diagnosis not present

## 2015-02-06 DIAGNOSIS — I2510 Atherosclerotic heart disease of native coronary artery without angina pectoris: Secondary | ICD-10-CM | POA: Diagnosis not present

## 2015-02-06 DIAGNOSIS — E559 Vitamin D deficiency, unspecified: Secondary | ICD-10-CM | POA: Diagnosis not present

## 2015-02-06 DIAGNOSIS — R10819 Abdominal tenderness, unspecified site: Secondary | ICD-10-CM | POA: Diagnosis not present

## 2015-02-06 DIAGNOSIS — E118 Type 2 diabetes mellitus with unspecified complications: Secondary | ICD-10-CM | POA: Diagnosis not present

## 2015-02-06 DIAGNOSIS — E785 Hyperlipidemia, unspecified: Secondary | ICD-10-CM | POA: Diagnosis not present

## 2015-02-06 NOTE — Progress Notes (Signed)
Subjective:    Patient ID: Misty Smith, female    DOB: 01/16/1941, 74 y.o.   MRN: [id number]  HPI Pt here for follow up and management of chronic medical problems which includes hypertension, hyperlipidemia, and CAD. She is taking medications regularly. The patient denies chest pain but does occasionally have some shortness of breath with a lot of exertion. She saw the cardiologist recently and everything was stable with him and this was about 1 month ago. She does complain of problems with her swallowing especially with bread. According to the patient just seems like the food does not want to go down well. In 1997 she had an endoscopy with stretching at that time. She says the problem now feels a lot like it did then. She does have a history of reflux and she has been asked to take a coated baby aspirin and she is concerned that this may be irritating her stomach. She stopped it because the stomach irritation continued she resumed it. She is not seeing any blood in the stool or having black tarry bowel movements. She is passing her water without any problems. She did say that she received a note indicating a colonoscopy was due soon and we will most likely go ahead and schedule her for this along with an endoscopy and dilatation. She brings in blood pressures for review from home and all these were good and will be scanned into the record. Her recent lab work was reviewed and she was given a copy of this. She also has some questions about a CT scan of the chest and this was done back in April and she was given a copy of this. She had some thyroid nodularity in the paratracheal area and this was considered stable by the radiologist. She also has some scattered pulmonary nodules which were considered stable because of several CT scans and no further CT scans were recommended.      Patient Active Problem List   Diagnosis Date Noted  . 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 CAD (coronary artery disease)  12/30/2013  . 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 Osteopenia of the elderly 10/26/2013  . Generalized anxiety disorder 09/21/2013  . GERD (gastroesophageal reflux disease) 09/21/2013  . Pulmonary nodule seen on imaging study 09/21/2013  . SYNCOPE 02/02/2009  . Pre-diabetes 02/01/2009  . HYPERLIPIDEMIA 02/01/2009  . Essential hypertension 02/01/2009  . DIZZINESS 02/01/2009  . PALPITATIONS 02/01/2009   Outpatient Encounter Prescriptions as of 02/06/2015  Medication Sig  . ALPRAZolam (XANAX) 0.25 MG tablet Take 0.25 mg by mouth at bedtime as needed for anxiety.   Marland Kitchen aspirin EC 81 MG tablet Take 1 tablet (81 mg total) by mouth daily.  Marland Kitchen atorvastatin (LIPITOR) 80 MG tablet TAKE AS DIRECTED  . Calcium Carb-Cholecalciferol (CALTRATE 600+D) 600-800 MG-UNIT TABS Take 1 tablet by mouth every evening.   . cholecalciferol (VITAMIN D) 1000 UNITS tablet Take 2,000 Units by mouth daily.  . clobetasol (TEMOVATE) 0.05 % external solution Apply 1 application topically 2 (two) times daily.  Marland Kitchen desonide (DESOWEN) 0.05 % cream Apply 1 application topically 2 (two) times daily as needed (for rash).   Marland Kitchen escitalopram (LEXAPRO) 20 MG tablet TAKE 1 TABLET EVERY DAY AS DIRECTED  . estradiol (ESTRACE VAGINAL) 0.1 MG/GM vaginal cream Place 2 g vaginally See admin instructions. Place 2 g vaginally 2-3 times weekly.  . Halcinonide (HALOG) 0.1 % CREA Apply 1 application topically 2 (two) times daily.  Marland Kitchen lisinopril (PRINIVIL,ZESTRIL) 20 MG tablet TAKE  1 TABLET (20 MG TOTAL) BY MOUTH DAILY.  . metoprolol tartrate (LOPRESSOR) 25 MG tablet TAKE ONE TABLET (25MG ) BY MOUTH IN THE AM AND one TABLET (25MG ) BY MOUTH IN THE PM.  . Multiple Vitamin (MULTIVITAMIN) tablet Take 1 tablet by mouth every morning.   Marland Kitchen NEXIUM 40 MG capsule TAKE 1 CAPSULE BY MOUTH ONCE A DAY  . omega-3 acid ethyl esters (LOVAZA) 1 G capsule TAKE 2 CAPSULES BY MOUTH TWICE A DAY  . pioglitazone (ACTOS) 30 MG tablet TAKE 1 TABLET (30 MG TOTAL) BY MOUTH  DAILY.   No facility-administered encounter medications on file as of 02/06/2015.      Review of Systems  Constitutional: Negative.   HENT: Negative.   Eyes: Negative.   Respiratory: Negative.   Cardiovascular: Negative.   Gastrointestinal: Positive for abdominal pain (on- going).  Endocrine: Negative.   Genitourinary: Negative.   Musculoskeletal: Positive for arthralgias.  Skin: Negative.   Allergic/Immunologic: Negative.   Neurological: Negative.   Hematological: Negative.   Psychiatric/Behavioral: Negative.        Objective:   Physical Exam  Constitutional: She is oriented to person, place, and time. She appears well-developed and well-nourished.  HENT:  Head: Normocephalic and atraumatic.  Right Ear: External ear normal.  Left Ear: External ear normal.  Nose: Nose normal.  Mouth/Throat: Oropharynx is clear and moist. No oropharyngeal exudate.  Eyes: Conjunctivae and EOM are normal. Pupils are equal, round, and reactive to light. Right eye exhibits no discharge. Left eye exhibits no discharge. No scleral icterus.  Neck: Normal range of motion. Neck supple. No thyromegaly present.  Without bruits or thyroid enlargement  Cardiovascular: Normal rate, regular rhythm, normal heart sounds and intact distal pulses.  Exam reveals no gallop and no friction rub.   No murmur heard. The heart is regular at 60/m  Pulmonary/Chest: Effort normal and breath sounds normal. No respiratory distress. She has no wheezes. She has no rales. She exhibits no tenderness.  Clear anteriorly and posteriorly  Abdominal: Soft. Bowel sounds are normal. She exhibits no mass. There is tenderness. There is no rebound and no guarding.  There was some epigastric tenderness and right sided abdominal tenderness. The right sided abdominal tenderness may be secondary to the gallbladder surgery that she has had recently.  Musculoskeletal: Normal range of motion. She exhibits no edema or tenderness.    Lymphadenopathy:    She has no cervical adenopathy.  Neurological: She is alert and oriented to person, place, and time. She has normal reflexes. No cranial nerve deficit.  Skin: Skin is warm and dry. No rash noted. No erythema.  Psychiatric: She has a normal mood and affect. Her behavior is normal. Judgment and thought content normal.  Nursing note and vitals reviewed.  BP 121/55 mmHg  Pulse 59  Temp(Src) 97.6 F (36.4 C) (Oral)  Ht 5\' 1"  (1.549 m)  Wt 176 lb (79.833 kg)  BMI 33.27 kg/m2        Assessment & Plan:  1. Gastroesophageal reflux disease, esophagitis presence not specified -She still taking medication for this and will continue to do that. We will schedule her for a visit with Dr. Lu Duffel guide to further evaluate her swallowing difficulties.  2. Thyroid nodule -The recent CT scan results were reviewed and according to the radiologist no further studies are necessary because of stable nodules that were present in the lungs in the thyroid area.  3. Metabolic syndrome -She will continue to work on her weight with diet and exercise  4. Hyperlipemia -All cholesterol numbers were at goal and she will continue with current treatment  5. Essential hypertension -The blood pressure today along with a home blood pressure readings were good and she will continue with her current treatment  6. Vitamin D deficiency -She will continue with vitamin D but will increase this dosage back to 2000 units daily  7. Abdominal tenderness -This may be from scar tissue from the recent gallbladder surgery but we will send her to the gastroenterologist and he can decide if a colonoscopy is warranted in addition to an endoscopy with dilatation - Ambulatory referral to Gastroenterology  8. Trouble swallowing -This is been getting worse over the past month and the patient will be referred. - Ambulatory referral to Gastroenterology   Patient Instructions                       Medicare  Annual Wellness Visit  [facility] and the medical providers at [facility] strive to bring you the best medical care.  In doing so we not only want to address your current medical conditions and concerns but also to detect new conditions early and prevent illness, disease and health-related problems.    Medicare offers a yearly Wellness Visit which allows our clinical staff to assess your need for preventative services including immunizations, lifestyle education, counseling to decrease risk of preventable diseases and screening for fall risk and other medical concerns.    This visit is provided free of charge (no copay) for all Medicare recipients. The clinical pharmacists at [facility] have begun to conduct these Wellness Visits which will also include a thorough review of all your medications.    As you primary medical provider recommend that you make an appointment for your Annual Wellness Visit if you have not done so already this year.  You may set up this appointment before you leave today or you may call back ([phone]) and schedule an appointment.  Please make sure when you call that you mention that you are scheduling your Annual Wellness Visit with the clinical pharmacist so that the appointment may be made for the proper length of time.     Continue current medications. Continue good therapeutic lifestyle changes which include good diet and exercise. Fall precautions discussed with patient. If an FOBT was given today- please return it to our front desk. If you are over 36 years old - you may need Prevnar 44 or the adult Pneumonia vaccine.  Flu Shots are still available at our office. If you still haven't had one please call to set up a nurse visit to get one.   After your visit with [place] today you will receive a survey in the mail or online from [employer] regarding your care with [place]. Please take a moment to fill this out. Your feedback is  very important to [place] as you can help [place] better understand your patient needs as well as improve your experience and satisfaction. WE CARE ABOUT YOU!!!   The patient should continue with her current medications and follow-up visits with cardiology and gastroenterology We will arrange for her to see the gastroenterologist sooner because of problems with swallowing that seemed to be getting worse. She is also having some abdominal pain. Hopefully he can do an endoscopy and stretching of her esophagus if that is necessary and since the colonoscopy is needing to be done that can be arranged also.   Arrie Senate  MD

## 2015-02-06 NOTE — Patient Instructions (Addendum)
Medicare Annual Wellness Visit  [facility] and the medical providers at [facility] strive to bring you the best medical care.  In doing so we not only want to address your current medical conditions and concerns but also to detect new conditions early and prevent illness, disease and health-related problems.    Medicare offers a yearly Wellness Visit which allows our clinical staff to assess your need for preventative services including immunizations, lifestyle education, counseling to decrease risk of preventable diseases and screening for fall risk and other medical concerns.    This visit is provided free of charge (no copay) for all Medicare recipients. The clinical pharmacists at [facility] have begun to conduct these Wellness Visits which will also include a thorough review of all your medications.    As you primary medical provider recommend that you make an appointment for your Annual Wellness Visit if you have not done so already this year.  You may set up this appointment before you leave today or you may call back ([phone]) and schedule an appointment.  Please make sure when you call that you mention that you are scheduling your Annual Wellness Visit with the clinical pharmacist so that the appointment may be made for the proper length of time.     Continue current medications. Continue good therapeutic lifestyle changes which include good diet and exercise. Fall precautions discussed with patient. If an FOBT was given today- please return it to our front desk. If you are over 60 years old - you may need Prevnar 66 or the adult Pneumonia vaccine.  Flu Shots are still available at our office. If you still haven't had one please call to set up a nurse visit to get one.   After your visit with [place] today you will receive a survey in the mail or online from [employer] regarding your care with [place]. Please take a moment to  fill this out. Your feedback is very important to [place] as you can help [place] better understand your patient needs as well as improve your experience and satisfaction. WE CARE ABOUT YOU!!!   The patient should continue with her current medications and follow-up visits with cardiology and gastroenterology We will arrange for her to see the gastroenterologist sooner because of problems with swallowing that seemed to be getting worse. She is also having some abdominal pain. Hopefully he can do an endoscopy and stretching of her esophagus if that is necessary and since the colonoscopy is needing to be done that can be arranged also.

## 2015-02-26 ENCOUNTER — Other Ambulatory Visit: Payer: Self-pay | Admitting: Gastroenterology

## 2015-02-26 ENCOUNTER — Ambulatory Visit
Admission: RE | Admit: 2015-02-26 | Discharge: 2015-02-26 | Disposition: A | Discharge status: Home / Self Care | Payer: Medicare Other | Source: Ambulatory Visit | Attending: Gastroenterology | Admitting: Gastroenterology

## 2015-02-26 DIAGNOSIS — K219 Gastro-esophageal reflux disease without esophagitis: Secondary | ICD-10-CM | POA: Diagnosis not present

## 2015-02-26 DIAGNOSIS — R1310 Dysphagia, unspecified: Secondary | ICD-10-CM

## 2015-02-26 DIAGNOSIS — K224 Dyskinesia of esophagus: Secondary | ICD-10-CM | POA: Diagnosis not present

## 2015-02-26 DIAGNOSIS — K449 Diaphragmatic hernia without obstruction or gangrene: Secondary | ICD-10-CM | POA: Diagnosis not present

## 2015-03-08 ENCOUNTER — Other Ambulatory Visit: Payer: Self-pay | Admitting: Family Medicine

## 2015-03-08 DIAGNOSIS — L570 Actinic keratosis: Secondary | ICD-10-CM | POA: Diagnosis not present

## 2015-03-08 DIAGNOSIS — L930 Discoid lupus erythematosus: Secondary | ICD-10-CM | POA: Diagnosis not present

## 2015-03-12 ENCOUNTER — Other Ambulatory Visit: Payer: Self-pay | Admitting: Family Medicine

## 2015-03-22 ENCOUNTER — Encounter: Payer: Self-pay | Admitting: *Deleted

## 2015-04-14 ENCOUNTER — Other Ambulatory Visit: Payer: Self-pay | Admitting: Family Medicine

## 2015-04-15 ENCOUNTER — Other Ambulatory Visit: Payer: Self-pay | Admitting: Family Medicine

## 2015-06-09 ENCOUNTER — Other Ambulatory Visit: Payer: Self-pay | Admitting: Family Medicine

## 2015-06-13 ENCOUNTER — Other Ambulatory Visit: Payer: Self-pay | Admitting: Family Medicine

## 2015-06-14 NOTE — Telephone Encounter (Signed)
Last seen 02/06/15  DW[facility]  Requesting 90 day supply

## 2015-06-18 ENCOUNTER — Ambulatory Visit: Payer: Medicare Other | Admitting: Family Medicine

## 2015-06-18 ENCOUNTER — Other Ambulatory Visit (INDEPENDENT_AMBULATORY_CARE_PROVIDER_SITE_OTHER): Payer: Medicare Other

## 2015-06-18 DIAGNOSIS — E785 Hyperlipidemia, unspecified: Secondary | ICD-10-CM

## 2015-06-18 DIAGNOSIS — I10 Essential (primary) hypertension: Secondary | ICD-10-CM | POA: Diagnosis not present

## 2015-06-18 DIAGNOSIS — E041 Nontoxic single thyroid nodule: Secondary | ICD-10-CM

## 2015-06-18 DIAGNOSIS — E559 Vitamin D deficiency, unspecified: Secondary | ICD-10-CM | POA: Diagnosis not present

## 2015-06-18 NOTE — Progress Notes (Signed)
Lab only 

## 2015-06-19 LAB — BMP8+EGFR
BUN / CREAT RATIO: 22 (ref 11–26)
BUN: 13 mg/dL (ref 8–27)
CO2: 27 mmol/L (ref 18–29)
CREATININE: 0.6 mg/dL (ref 0.57–1.00)
Calcium: 9.1 mg/dL (ref 8.7–10.3)
Chloride: 102 mmol/L (ref 97–106)
GFR, EST AFRICAN AMERICAN: 102 mL/min/{1.73_m2} (ref >59)
GFR, EST NON AFRICAN AMERICAN: 89 mL/min/{1.73_m2} (ref >59)
Glucose: 107 mg/dL — ABNORMAL HIGH (ref 65–99)
Potassium: 5.1 mmol/L (ref 3.5–5.2)
Sodium: 143 mmol/L (ref 136–144)

## 2015-06-19 LAB — CBC WITH DIFFERENTIAL/PLATELET
BASOS: 0 %
Basophils Absolute: 0 10*3/uL (ref 0.0–0.2)
EOS (ABSOLUTE): 0.1 10*3/uL (ref 0.0–0.4)
Eos: 2 %
HEMOGLOBIN: 11.8 g/dL (ref 11.1–15.9)
Hematocrit: 36.3 % (ref 34.0–46.6)
Immature Grans (Abs): 0 10*3/uL (ref 0.0–0.1)
Immature Granulocytes: 0 %
LYMPHS ABS: 2.1 10*3/uL (ref 0.7–3.1)
LYMPHS: 36 %
MCH: 29.7 pg (ref 26.6–33.0)
MCHC: 32.5 g/dL (ref 31.5–35.7)
MCV: 91 fL (ref 79–97)
MONOCYTES: 11 %
Monocytes Absolute: 0.6 10*3/uL (ref 0.1–0.9)
NEUTROS ABS: 2.9 10*3/uL (ref 1.4–7.0)
Neutrophils: 51 %
Platelets: 227 10*3/uL (ref 150–379)
RBC: 3.97 x10E6/uL (ref 3.77–5.28)
RDW: 14.9 % (ref 12.3–15.4)
WBC: 5.8 10*3/uL (ref 3.4–10.8)

## 2015-06-19 LAB — THYROID PANEL WITH TSH
FREE THYROXINE INDEX: 2.5 (ref 1.2–4.9)
T3 UPTAKE RATIO: 29 % (ref 24–39)
T4, Total: 8.7 ug/dL (ref 4.5–12.0)
TSH: 2.07 u[IU]/mL (ref 0.450–4.500)

## 2015-06-19 LAB — NMR, LIPOPROFILE
CHOLESTEROL: 133 mg/dL (ref 100–199)
HDL Cholesterol by NMR: 50 mg/dL (ref >39)
HDL Particle Number: 30.8 umol/L (ref >=30.5)
LDL PARTICLE NUMBER: 651 nmol/L (ref <1000)
LDL Size: 21.3 nm (ref >20.5)
LDL-C: 60 mg/dL (ref 0–99)
LP-IR SCORE: 31 (ref <=45)
Small LDL Particle Number: 255 nmol/L (ref <=527)
Triglycerides by NMR: 117 mg/dL (ref 0–149)

## 2015-06-19 LAB — HEPATIC FUNCTION PANEL
ALT: 29 IU/L (ref 0–32)
AST: 35 IU/L (ref 0–40)
Albumin: 3.8 g/dL (ref 3.5–4.8)
Alkaline Phosphatase: 69 IU/L (ref 39–117)
BILIRUBIN, DIRECT: 0.11 mg/dL (ref 0.00–0.40)
Bilirubin Total: 0.3 mg/dL (ref 0.0–1.2)
Total Protein: 6.4 g/dL (ref 6.0–8.5)

## 2015-06-19 LAB — VITAMIN D 25 HYDROXY (VIT D DEFICIENCY, FRACTURES): VIT D 25 HYDROXY: 50.9 ng/mL (ref 30.0–100.0)

## 2015-06-22 ENCOUNTER — Ambulatory Visit (INDEPENDENT_AMBULATORY_CARE_PROVIDER_SITE_OTHER): Payer: Medicare Other | Admitting: Family Medicine

## 2015-06-22 ENCOUNTER — Encounter: Payer: Self-pay | Admitting: Family Medicine

## 2015-06-22 VITALS — BP 130/62 | HR 47 | Temp 97.0°F | Ht 61.0 in | Wt 172.0 lb

## 2015-06-22 DIAGNOSIS — E785 Hyperlipidemia, unspecified: Secondary | ICD-10-CM

## 2015-06-22 DIAGNOSIS — I2510 Atherosclerotic heart disease of native coronary artery without angina pectoris: Secondary | ICD-10-CM | POA: Diagnosis not present

## 2015-06-22 DIAGNOSIS — E559 Vitamin D deficiency, unspecified: Secondary | ICD-10-CM

## 2015-06-22 DIAGNOSIS — K219 Gastro-esophageal reflux disease without esophagitis: Secondary | ICD-10-CM

## 2015-06-22 DIAGNOSIS — Z23 Encounter for immunization: Secondary | ICD-10-CM

## 2015-06-22 DIAGNOSIS — M79604 Pain in right leg: Secondary | ICD-10-CM

## 2015-06-22 DIAGNOSIS — I10 Essential (primary) hypertension: Secondary | ICD-10-CM | POA: Diagnosis not present

## 2015-06-22 NOTE — Patient Instructions (Addendum)
Medicare Annual Wellness Visit  [facility] and the medical providers at [facility] strive to bring you the best medical care.  In doing so we not only want to address your current medical conditions and concerns but also to detect new conditions early and prevent illness, disease and health-related problems.    Medicare offers a yearly Wellness Visit which allows our clinical staff to assess your need for preventative services including immunizations, lifestyle education, counseling to decrease risk of preventable diseases and screening for fall risk and other medical concerns.    This visit is provided free of charge (no copay) for all Medicare recipients. The clinical pharmacists at [facility] have begun to conduct these Wellness Visits which will also include a thorough review of all your medications.    As you primary medical provider recommend that you make an appointment for your Annual Wellness Visit if you have not done so already this year.  You may set up this appointment before you leave today or you may call back [phone]) and schedule an appointment.  Please make sure when you call that you mention that you are scheduling your Annual Wellness Visit with the clinical pharmacist so that the appointment may be made for the proper length of time.     Continue current medications. Continue good therapeutic lifestyle changes which include good diet and exercise. Fall precautions discussed with patient. If an FOBT was given today- please return it to our front desk. If you are over 60 years old - you may need Prevnar 19 or the adult Pneumonia vaccine.  **Flu shots are available--- please call and schedule a FLU-CLINIC appointment**  After your visit with [place] today you will receive a survey in the mail or online from [employer] regarding your care with [place]. Please take a moment to fill this out. Your feedback is very  important to [place] as you can help [place] better understand your patient needs as well as improve your experience and satisfaction. WE CARE ABOUT YOU!!!   Please check blood pressure medication and call us back with the directions and the strength of the medicine Continue to try to get as much exercise as possible Continue to take current medication Return the FOBT

## 2015-06-22 NOTE — Progress Notes (Signed)
Subjective:    Patient ID: Misty Smith, female    DOB: 12-06-1940, 74 y.o.   MRN: [id number]  HPI Pt here for follow up and management of chronic medical problems which includes hypertension and hyperlipidemia. She is taking medications regularly. The patient's review of systems were essentially negative. She does complain of some arthralgias in her right foot. She will get her flu shot today will be given an FOBT to return and we will review her lab work with her.The CBC has a normal white blood cell count. The hemoglobin is within normal limits at 11.8 and the platelet count is adequate. The blood sugar slightly elevated at 107. The creatinine, the most important kidney function test is within normal limits. The electrolytes including potassium are good. All liver function tests are within normal limits Cholesterol numbers with advanced lipid testing have a total LDL particle number that remains good and at goal at 651. The LDL C is good at 60. The triglycerides are good at 117 and the HDL particle number or the good cholesterol remains within normal limits. The patient should continue with her current treatment and with as aggressive therapeutic lifestyle changes as possible The vitamin D level is good and within normal limits and the patient should continue with current treatment. All of this was good. The patient denies chest pain shortness of breath trouble swallowing and heartburn indigestion and nausea vomiting diarrhea or blood in the stool. She is passing her water well without problems.      Patient Active Problem List   Diagnosis Date Noted  . 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 CAD (coronary artery disease) 12/30/2013  . 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 Osteopenia of the elderly 10/26/2013  . Generalized anxiety disorder 09/21/2013  . GERD (gastroesophageal reflux disease) 09/21/2013  . Pulmonary nodule seen on imaging study 09/21/2013  . SYNCOPE 02/02/2009  .  Pre-diabetes 02/01/2009  . HYPERLIPIDEMIA 02/01/2009  . Essential hypertension 02/01/2009  . DIZZINESS 02/01/2009  . PALPITATIONS 02/01/2009   Outpatient Encounter Prescriptions as of 06/22/2015  Medication Sig  . ALPRAZolam (XANAX) 0.25 MG tablet Take 0.25 mg by mouth at bedtime as needed for anxiety.   Marland Kitchen aspirin EC 81 MG tablet Take 1 tablet (81 mg total) by mouth daily.  Marland Kitchen atorvastatin (LIPITOR) 80 MG tablet TAKE AS DIRECTED  . Calcium Carb-Cholecalciferol (CALTRATE 600+D) 600-800 MG-UNIT TABS Take 1 tablet by mouth every evening.   . cholecalciferol (VITAMIN D) 1000 UNITS tablet Take 2,000 Units by mouth daily.  . clobetasol (TEMOVATE) 0.05 % external solution Apply 1 application topically 2 (two) times daily.  Marland Kitchen desonide (DESOWEN) 0.05 % cream Apply 1 application topically 2 (two) times daily as needed (for rash).   Marland Kitchen escitalopram (LEXAPRO) 20 MG tablet TAKE 1 TABLET EVERY DAY AS DIRECTED  . esomeprazole (NEXIUM) 40 MG capsule TAKE 1 CAPSULE BY MOUTH ONCE A DAY  . estradiol (ESTRACE VAGINAL) 0.1 MG/GM vaginal cream Place 2 g vaginally See admin instructions. Place 2 g vaginally 2-3 times weekly.  . Halcinonide (HALOG) 0.1 % CREA Apply 1 application topically 2 (two) times daily.  Marland Kitchen lisinopril (PRINIVIL,ZESTRIL) 20 MG tablet TAKE 1 TABLET (20 MG TOTAL) BY MOUTH DAILY.  . metoprolol tartrate (LOPRESSOR) 25 MG tablet 50 mg BID - per pt  . Multiple Vitamin (MULTIVITAMIN) tablet Take 1 tablet by mouth every morning.   Marland Kitchen omega-3 acid ethyl esters (LOVAZA) 1 G capsule TAKE 2 CAPSULES BY MOUTH TWICE A DAY  . pioglitazone (  ACTOS) 30 MG tablet TAKE 1 TABLET (30 MG TOTAL) BY MOUTH DAILY.   No facility-administered encounter medications on file as of 06/22/2015.     Review of Systems  Constitutional: Negative.   HENT: Negative.   Eyes: Negative.   Respiratory: Negative.   Cardiovascular: Negative.   Gastrointestinal: Negative.   Endocrine: Negative.   Genitourinary: Negative.     Musculoskeletal: Positive for arthralgias (right foot pain).  Skin: Negative.   Allergic/Immunologic: Negative.   Neurological: Negative.   Hematological: Negative.   Psychiatric/Behavioral: Negative.        Objective:   Physical Exam  Constitutional: She is oriented to person, place, and time. She appears well-developed and well-nourished. No distress.  HENT:  Head: Normocephalic and atraumatic.  Right Ear: External ear normal.  Left Ear: External ear normal.  Nose: Nose normal.  Mouth/Throat: Oropharynx is clear and moist.  Eyes: Conjunctivae and EOM are normal. Pupils are equal, round, and reactive to light. Right eye exhibits no discharge. Left eye exhibits no discharge. No scleral icterus.  Neck: Normal range of motion. Neck supple. No thyromegaly present.  Cardiovascular: Normal rate, regular rhythm, normal heart sounds and intact distal pulses.   No murmur heard. The heart was regular at 60/m  Pulmonary/Chest: Effort normal and breath sounds normal. No respiratory distress. She has no wheezes. She has no rales. She exhibits no tenderness.  Abdominal: Soft. Bowel sounds are normal. She exhibits no mass. There is no tenderness. There is no rebound and no guarding.  Musculoskeletal: Normal range of motion. She exhibits tenderness. She exhibits no edema.  The right foot is somewhat painful. To palpation.  Lymphadenopathy:    She has no cervical adenopathy.  Neurological: She is alert and oriented to person, place, and time. She has normal reflexes. No cranial nerve deficit.  Skin: Skin is warm and dry. No rash noted.  Psychiatric: She has a normal mood and affect. Her behavior is normal. Judgment and thought content normal.  Nursing note and vitals reviewed.  BP 130/62 mmHg  Pulse 47  Temp(Src) 97 F (36.1 C) (Oral)  Ht 5\' 1"  (1.549 m)  Wt 172 lb (78.019 kg)  BMI 32.52 kg/m2        Assessment & Plan:  1. Gastroesophageal reflux disease, esophagitis presence not  specified -The patient had no complaints of this today and she should continue to watch her diet closely and avoid foods that irritate her stomach more.  2. Hyperlipemia -Her cholesterol numbers were excellent she should continue with her current treatment  3. Essential hypertension -Her blood pressure is good today and she should continue with the current treatment and she needs to let us know the strength of the medicine that she is taking at home so that we can correct our medication list if necessary.  4. Vitamin D deficiency -The vitamin D level is good and she should continue with current treatment  5. Pain of right lower extremity -She should take Tylenol for pain if she continues to have trouble with her foot she should return to clinic and get an x-ray. -She should also use warm wet compresses to the foot.  Patient Instructions                       Medicare Annual Wellness Visit  [facility] and the medical providers at [facility] strive to bring you the best medical care.  In doing so we not only want to address your  current medical conditions and concerns but also to detect new conditions early and prevent illness, disease and health-related problems.    Medicare offers a yearly Wellness Visit which allows our clinical staff to assess your need for preventative services including immunizations, lifestyle education, counseling to decrease risk of preventable diseases and screening for fall risk and other medical concerns.    This visit is provided free of charge (no copay) for all Medicare recipients. The clinical pharmacists at [facility] have begun to conduct these Wellness Visits which will also include a thorough review of all your medications.    As you primary medical provider recommend that you make an appointment for your Annual Wellness Visit if you have not done so already this year.  You may set up this appointment before  you leave today or you may call back [phone]) and schedule an appointment.  Please make sure when you call that you mention that you are scheduling your Annual Wellness Visit with the clinical pharmacist so that the appointment may be made for the proper length of time.     Continue current medications. Continue good therapeutic lifestyle changes which include good diet and exercise. Fall precautions discussed with patient. If an FOBT was given today- please return it to our front desk. If you are over 5 years old - you may need Prevnar 57 or the adult Pneumonia vaccine.  **Flu shots are available--- please call and schedule a FLU-CLINIC appointment**  After your visit with [place] today you will receive a survey in the mail or online from [employer] regarding your care with [place]. Please take a moment to fill this out. Your feedback is very important to [place] as you can help [place] better understand your patient needs as well as improve your experience and satisfaction. WE CARE ABOUT YOU!!!   Please check blood pressure medication and call us back with the directions and the strength of the medicine Continue to try to get as much exercise as possible Continue to take current medication Return the FOBT     Arrie Senate MD

## 2015-06-25 ENCOUNTER — Other Ambulatory Visit: Payer: Medicare Other

## 2015-06-25 DIAGNOSIS — Z1212 Encounter for screening for malignant neoplasm of rectum: Secondary | ICD-10-CM | POA: Diagnosis not present

## 2015-06-25 NOTE — Progress Notes (Signed)
Lab only 

## 2015-06-27 LAB — FECAL OCCULT BLOOD, IMMUNOCHEMICAL: FECAL OCCULT BLD: NEGATIVE

## 2015-07-16 ENCOUNTER — Other Ambulatory Visit: Payer: Self-pay | Admitting: Family Medicine

## 2015-07-17 DIAGNOSIS — L930 Discoid lupus erythematosus: Secondary | ICD-10-CM | POA: Diagnosis not present

## 2015-07-17 DIAGNOSIS — L821 Other seborrheic keratosis: Secondary | ICD-10-CM | POA: Diagnosis not present

## 2015-07-17 DIAGNOSIS — L812 Freckles: Secondary | ICD-10-CM | POA: Diagnosis not present

## 2015-07-22 ENCOUNTER — Other Ambulatory Visit: Payer: Self-pay | Admitting: Family Medicine

## 2015-08-13 ENCOUNTER — Other Ambulatory Visit: Payer: Self-pay | Admitting: Family Medicine

## 2015-08-23 ENCOUNTER — Other Ambulatory Visit: Payer: Self-pay | Admitting: Family Medicine

## 2015-08-28 DIAGNOSIS — D3132 Benign neoplasm of left choroid: Secondary | ICD-10-CM | POA: Diagnosis not present

## 2015-08-28 DIAGNOSIS — M329 Systemic lupus erythematosus, unspecified: Secondary | ICD-10-CM | POA: Diagnosis not present

## 2015-08-28 DIAGNOSIS — H40013 Open angle with borderline findings, low risk, bilateral: Secondary | ICD-10-CM | POA: Diagnosis not present

## 2015-08-28 DIAGNOSIS — E119 Type 2 diabetes mellitus without complications: Secondary | ICD-10-CM | POA: Diagnosis not present

## 2015-08-28 DIAGNOSIS — H04123 Dry eye syndrome of bilateral lacrimal glands: Secondary | ICD-10-CM | POA: Diagnosis not present

## 2015-08-28 DIAGNOSIS — H2513 Age-related nuclear cataract, bilateral: Secondary | ICD-10-CM | POA: Diagnosis not present

## 2015-08-28 LAB — HM DIABETES EYE EXAM

## 2015-08-30 DIAGNOSIS — L930 Discoid lupus erythematosus: Secondary | ICD-10-CM | POA: Diagnosis not present

## 2015-09-07 ENCOUNTER — Other Ambulatory Visit: Payer: Self-pay | Admitting: Family Medicine

## 2015-09-07 NOTE — Telephone Encounter (Signed)
Didn't see reason for taking this

## 2015-09-18 ENCOUNTER — Encounter: Payer: Self-pay | Admitting: *Deleted

## 2015-10-16 DIAGNOSIS — L931 Subacute cutaneous lupus erythematosus: Secondary | ICD-10-CM | POA: Diagnosis not present

## 2015-10-16 DIAGNOSIS — L309 Dermatitis, unspecified: Secondary | ICD-10-CM | POA: Diagnosis not present

## 2015-10-18 ENCOUNTER — Other Ambulatory Visit: Payer: Self-pay | Admitting: Orthopedic Surgery

## 2015-10-18 ENCOUNTER — Other Ambulatory Visit: Payer: Medicare Other

## 2015-10-18 ENCOUNTER — Ambulatory Visit (INDEPENDENT_AMBULATORY_CARE_PROVIDER_SITE_OTHER): Payer: Medicare Other

## 2015-10-18 DIAGNOSIS — M25562 Pain in left knee: Secondary | ICD-10-CM

## 2015-10-18 DIAGNOSIS — M7061 Trochanteric bursitis, right hip: Secondary | ICD-10-CM | POA: Diagnosis not present

## 2015-10-18 DIAGNOSIS — R52 Pain, unspecified: Secondary | ICD-10-CM

## 2015-10-18 DIAGNOSIS — M1712 Unilateral primary osteoarthritis, left knee: Secondary | ICD-10-CM | POA: Diagnosis not present

## 2015-10-28 ENCOUNTER — Other Ambulatory Visit: Payer: Self-pay | Admitting: Family Medicine

## 2015-11-01 ENCOUNTER — Other Ambulatory Visit: Payer: Medicare Other

## 2015-11-01 DIAGNOSIS — I10 Essential (primary) hypertension: Secondary | ICD-10-CM

## 2015-11-01 DIAGNOSIS — E785 Hyperlipidemia, unspecified: Secondary | ICD-10-CM

## 2015-11-01 DIAGNOSIS — E559 Vitamin D deficiency, unspecified: Secondary | ICD-10-CM

## 2015-11-01 DIAGNOSIS — E041 Nontoxic single thyroid nodule: Secondary | ICD-10-CM | POA: Diagnosis not present

## 2015-11-01 DIAGNOSIS — K219 Gastro-esophageal reflux disease without esophagitis: Secondary | ICD-10-CM

## 2015-11-02 ENCOUNTER — Telehealth: Payer: Self-pay | Admitting: *Deleted

## 2015-11-02 LAB — CBC WITH DIFFERENTIAL/PLATELET
BASOS ABS: 0 10*3/uL (ref 0.0–0.2)
BASOS: 0 %
EOS (ABSOLUTE): 0.1 10*3/uL (ref 0.0–0.4)
Eos: 1 %
HEMOGLOBIN: 12.4 g/dL (ref 11.1–15.9)
Hematocrit: 38 % (ref 34.0–46.6)
IMMATURE GRANS (ABS): 0 10*3/uL (ref 0.0–0.1)
Immature Granulocytes: 0 %
LYMPHS ABS: 2.3 10*3/uL (ref 0.7–3.1)
LYMPHS: 35 %
MCH: 30.1 pg (ref 26.6–33.0)
MCHC: 32.6 g/dL (ref 31.5–35.7)
MCV: 92 fL (ref 79–97)
MONOCYTES: 8 %
Monocytes Absolute: 0.5 10*3/uL (ref 0.1–0.9)
NEUTROS ABS: 3.5 10*3/uL (ref 1.4–7.0)
Neutrophils: 56 %
Platelets: 214 10*3/uL (ref 150–379)
RBC: 4.12 x10E6/uL (ref 3.77–5.28)
RDW: 15.2 % (ref 12.3–15.4)
WBC: 6.5 10*3/uL (ref 3.4–10.8)

## 2015-11-02 LAB — BMP8+EGFR
BUN / CREAT RATIO: 17 (ref 11–26)
BUN: 12 mg/dL (ref 8–27)
CHLORIDE: 100 mmol/L (ref 96–106)
CO2: 24 mmol/L (ref 18–29)
Calcium: 9.4 mg/dL (ref 8.7–10.3)
Creatinine, Ser: 0.69 mg/dL (ref 0.57–1.00)
GFR, EST AFRICAN AMERICAN: 98 mL/min/{1.73_m2} (ref >59)
GFR, EST NON AFRICAN AMERICAN: 85 mL/min/{1.73_m2} (ref >59)
Glucose: 94 mg/dL (ref 65–99)
POTASSIUM: 4.8 mmol/L (ref 3.5–5.2)
Sodium: 140 mmol/L (ref 134–144)

## 2015-11-02 LAB — NMR, LIPOPROFILE
CHOLESTEROL: 150 mg/dL (ref 100–199)
HDL CHOLESTEROL BY NMR: 63 mg/dL (ref >39)
HDL PARTICLE NUMBER: 35.3 umol/L (ref >=30.5)
LDL Particle Number: 695 nmol/L (ref <1000)
LDL Size: 22 nm (ref >20.5)
LDL-C: 66 mg/dL (ref 0–99)
LP-IR Score: 26 (ref <=45)
SMALL LDL PARTICLE NUMBER: 222 nmol/L (ref <=527)
TRIGLYCERIDES BY NMR: 106 mg/dL (ref 0–149)

## 2015-11-02 LAB — THYROID PANEL WITH TSH
FREE THYROXINE INDEX: 2.5 (ref 1.2–4.9)
T3 Uptake Ratio: 31 % (ref 24–39)
T4, Total: 8 ug/dL (ref 4.5–12.0)
TSH: 1.79 u[IU]/mL (ref 0.450–4.500)

## 2015-11-02 LAB — HEPATIC FUNCTION PANEL
ALBUMIN: 3.8 g/dL (ref 3.5–4.8)
ALT: 30 IU/L (ref 0–32)
AST: 31 IU/L (ref 0–40)
Alkaline Phosphatase: 64 IU/L (ref 39–117)
BILIRUBIN TOTAL: 0.4 mg/dL (ref 0.0–1.2)
Bilirubin, Direct: 0.13 mg/dL (ref 0.00–0.40)
TOTAL PROTEIN: 6.4 g/dL (ref 6.0–8.5)

## 2015-11-02 LAB — VITAMIN D 25 HYDROXY (VIT D DEFICIENCY, FRACTURES): Vit D, 25-Hydroxy: 52.1 ng/mL (ref 30.0–100.0)

## 2015-11-02 NOTE — Telephone Encounter (Signed)
Pt notified of results Verbalizes understanding 

## 2015-11-02 NOTE — Telephone Encounter (Signed)
-----   Message from Chipper Herb, MD sent at 11/02/2015  4:21 PM EST ----- All cholesterol numbers with advanced lipid testing are excellent and at goal and the patient should continue with current treatment and aggressive therapeutic lifestyle changes

## 2015-11-09 ENCOUNTER — Encounter: Payer: Self-pay | Admitting: Family Medicine

## 2015-11-09 ENCOUNTER — Other Ambulatory Visit: Payer: Self-pay | Admitting: Family Medicine

## 2015-11-09 ENCOUNTER — Ambulatory Visit (INDEPENDENT_AMBULATORY_CARE_PROVIDER_SITE_OTHER): Payer: Medicare Other | Admitting: Family Medicine

## 2015-11-09 VITALS — BP 136/69 | HR 49 | Temp 97.5°F | Ht 61.0 in | Wt 167.0 lb

## 2015-11-09 DIAGNOSIS — I2583 Coronary atherosclerosis due to lipid rich plaque: Secondary | ICD-10-CM

## 2015-11-09 DIAGNOSIS — E559 Vitamin D deficiency, unspecified: Secondary | ICD-10-CM

## 2015-11-09 DIAGNOSIS — I2510 Atherosclerotic heart disease of native coronary artery without angina pectoris: Secondary | ICD-10-CM | POA: Diagnosis not present

## 2015-11-09 DIAGNOSIS — I10 Essential (primary) hypertension: Secondary | ICD-10-CM | POA: Diagnosis not present

## 2015-11-09 DIAGNOSIS — K219 Gastro-esophageal reflux disease without esophagitis: Secondary | ICD-10-CM | POA: Diagnosis not present

## 2015-11-09 DIAGNOSIS — S5002XA Contusion of left elbow, initial encounter: Secondary | ICD-10-CM

## 2015-11-09 DIAGNOSIS — S8002XA Contusion of left knee, initial encounter: Secondary | ICD-10-CM | POA: Diagnosis not present

## 2015-11-09 DIAGNOSIS — E785 Hyperlipidemia, unspecified: Secondary | ICD-10-CM

## 2015-11-09 MED ORDER — ESCITALOPRAM OXALATE 20 MG PO TABS: 20.0000 mg | ORAL_TABLET | Freq: Every day | ORAL | Status: DC

## 2015-11-09 NOTE — Patient Instructions (Addendum)
Medicare Annual Wellness Visit  [facility] and the medical providers at [facility] strive to bring you the best medical care.  In doing so we not only want to address your current medical conditions and concerns but also to detect new conditions early and prevent illness, disease and health-related problems.    Medicare offers a yearly Wellness Visit which allows our clinical staff to assess your need for preventative services including immunizations, lifestyle education, counseling to decrease risk of preventable diseases and screening for fall risk and other medical concerns.    This visit is provided free of charge (no copay) for all Medicare recipients. The clinical pharmacists at [facility] have begun to conduct these Wellness Visits which will also include a thorough review of all your medications.    As you primary medical provider recommend that you make an appointment for your Annual Wellness Visit if you have not done so already this year.  You may set up this appointment before you leave today or you may call back [phone] and schedule an appointment.  Please make sure when you call that you mention that you are scheduling your Annual Wellness Visit with the clinical pharmacist so that the appointment may be made for the proper length of time.     Continue current medications. Continue good therapeutic lifestyle changes which include good diet and exercise. Fall precautions discussed with patient. If an FOBT was given today- please return it to our front desk. If you are over 77 years old - you may need Prevnar 32 or the adult Pneumonia vaccine.  **Flu shots are available--- please call and schedule a FLU-CLINIC appointment**  After your visit with [place] today you will receive a survey in the mail or online from [employer] regarding your care with [place]. Please take a moment to fill this out. Your feedback is very  important to [place] as you can help [place] better understand your patient needs as well as improve your experience and satisfaction. WE CARE ABOUT YOU!!!   If the patient continues to have elbow pain and knee pain she should come to the office and get x-rays in 3-4 weeks otherwise, she should continue to take Tylenol and gradually increase her activity with these joints. She should follow-up with the cardiologist as planned She should continue to monitor blood pressures at home When she is able she needs to try to walk and get more exercise and especially as the weather improves Continue to watch sodium intake and take medications as doing The patient also needs to get her mammogram done and hopefully this will be scheduled when she leaves office today.

## 2015-11-09 NOTE — Progress Notes (Addendum)
Subjective:    Patient ID: Misty Smith, female    DOB: [birth year], 75 y.o.   MRN: [id number]  HPI Pt here for follow up and management of chronic medical problems which includes hypertension and hyperlipidemia. She is taking medications regularly. The patient had a recent fall affecting her left elbow and left knee. She is requesting a refill on the Lexapro. She is in need of getting a mammogram and we will hopefully arrange for her to get this done. The patient is feeling well overall. She does have occasional shortness of breath with exertion. She denies any chest pain but has occasional palpitations or fluttering. She sees the cardiologist yearly. She does have occasional heartburn indigestion secondary to what she eats but otherwise this is stable. She denies any blood in the stool or black tarry bowel movements and is passing her water without problems.     Patient Active Problem List   Diagnosis Date Noted  . 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 CAD (coronary artery disease) 12/30/2013  . 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 Osteopenia of the elderly 10/26/2013  . Generalized anxiety disorder 09/21/2013  . GERD (gastroesophageal reflux disease) 09/21/2013  . Pulmonary nodule seen on imaging study 09/21/2013  . SYNCOPE 02/02/2009  . Pre-diabetes 02/01/2009  . HYPERLIPIDEMIA 02/01/2009  . Essential hypertension 02/01/2009  . DIZZINESS 02/01/2009  . PALPITATIONS 02/01/2009   Outpatient Encounter Prescriptions as of 11/09/2015  Medication Sig  . ALPRAZolam (XANAX) 0.25 MG tablet Take 0.25 mg by mouth at bedtime as needed for anxiety.   Marland Kitchen aspirin EC 81 MG tablet Take 1 tablet (81 mg total) by mouth daily.  Marland Kitchen atorvastatin (LIPITOR) 80 MG tablet TAKE AS DIRECTED  . Calcium Carb-Cholecalciferol (CALTRATE 600+D) 600-800 MG-UNIT TABS Take 1 tablet by mouth every evening.   . cholecalciferol (VITAMIN D) 1000 UNITS tablet Take 2,000 Units by mouth daily.  . clobetasol (TEMOVATE)  0.05 % external solution Apply 1 application topically 2 (two) times daily.  Marland Kitchen desonide (DESOWEN) 0.05 % cream Apply 1 application topically 2 (two) times daily as needed (for rash).   Marland Kitchen escitalopram (LEXAPRO) 20 MG tablet Take 1 tablet (20 mg total) by mouth daily.  Marland Kitchen esomeprazole (NEXIUM) 40 MG capsule TAKE 1 CAPSULE BY MOUTH ONCE A DAY  . estradiol (ESTRACE VAGINAL) 0.1 MG/GM vaginal cream Place 2 g vaginally See admin instructions. Place 2 g vaginally 2-3 times weekly.  . Halcinonide (HALOG) 0.1 % CREA Apply 1 application topically 2 (two) times daily.  Marland Kitchen lisinopril (PRINIVIL,ZESTRIL) 20 MG tablet TAKE 1 TABLET (20 MG TOTAL) BY MOUTH DAILY.  . metoprolol tartrate (LOPRESSOR) 25 MG tablet 50 mg BID - per pt  . metoprolol tartrate (LOPRESSOR) 25 MG tablet TAKE ONE TABLET (25MG ) BY MOUTH IN THE AM AND TWO TABLETS (50MG ) BY MOUTH IN THE PM.  . Multiple Vitamin (MULTIVITAMIN) tablet Take 1 tablet by mouth every morning.   Marland Kitchen omega-3 acid ethyl esters (LOVAZA) 1 g capsule TAKE 2 CAPSULES BY MOUTH TWICE A DAY  . pioglitazone (ACTOS) 30 MG tablet TAKE 1 TABLET (30 MG TOTAL) BY MOUTH DAILY.  . [DISCONTINUED] escitalopram (LEXAPRO) 20 MG tablet TAKE 1 TABLET EVERY DAY AS DIRECTED   No facility-administered encounter medications on file as of 11/09/2015.      Review of Systems  Constitutional: Negative.   HENT: Negative.   Eyes: Negative.   Respiratory: Negative.   Cardiovascular: Negative.   Gastrointestinal: Negative.   Endocrine: Negative.   Genitourinary: Negative.  Musculoskeletal: Positive for arthralgias (left elbow and left knee from fall).  Skin: Negative.   Allergic/Immunologic: Negative.   Neurological: Negative.   Hematological: Negative.   Psychiatric/Behavioral: Negative.        Objective:   Physical Exam  Constitutional: She is oriented to person, place, and time. She appears well-developed and well-nourished. No distress.  The patient is pleasant and alert  HENT:    Head: Normocephalic and atraumatic.  Right Ear: External ear normal.  Left Ear: External ear normal.  Nose: Nose normal.  Mouth/Throat: Oropharynx is clear and moist.  Eyes: Conjunctivae and EOM are normal. Pupils are equal, round, and reactive to light. Right eye exhibits no discharge. Left eye exhibits no discharge. No scleral icterus.  Neck: Normal range of motion. Neck supple. No thyromegaly present.  No bruits or thyromegaly or adenopathy  Cardiovascular: Normal rate, regular rhythm, normal heart sounds and intact distal pulses.   No murmur heard. At 60/m  Pulmonary/Chest: Effort normal and breath sounds normal. No respiratory distress. She has no wheezes. She has no rales. She exhibits no tenderness.  Clear anteriorly and posteriorly  Abdominal: Soft. Bowel sounds are normal. She exhibits no mass. There is no tenderness. There is no rebound and no guarding.  Minimal tenderness without liver or spleen enlargement  Musculoskeletal: Normal range of motion. She exhibits no edema.  The left elbow is slightly tender and painful with certain range of motions but flexion and extension appear to be good. The knee was good.  Lymphadenopathy:    She has no cervical adenopathy.  Neurological: She is alert and oriented to person, place, and time. She has normal reflexes. No cranial nerve deficit.  Skin: Skin is warm and dry. No rash noted.  Psychiatric: She has a normal mood and affect. Her behavior is normal. Judgment and thought content normal.  Nursing note and vitals reviewed.  BP 136/69 mmHg  Pulse 49  Temp(Src) 97.5 F (36.4 C) (Oral)  Ht 5\' 1"  (1.549 m)  Wt 167 lb (75.751 kg)  BMI 31.57 kg/m2        Assessment & Plan:  1. Gastroesophageal reflux disease, esophagitis presence not specified -Continue with current treatment and watch diet as closely as possible  2. Essential hypertension -Blood pressure is good today and she will continue with current treatment  3.  Hyperlipemia -All cholesterol numbers were excellent and she will continue with current treatment and as aggressive therapeutic lifestyle changes as possible  4. Vitamin D deficiency -Continue with current treatment  5. Hyperlipidemia -Continue with cholesterol treatment and therapeutic lifestyle changes  6. Coronary artery disease due to lipid rich plaque -Follow-up with cardiology yearly  7. Left elbow contusion, initial encounter -X-ray elbow if pain continues for the next 3-4 weeks  8. Knee contusion, left, initial encounter -X-ray knee if pain continues and does not improve  Meds ordered this encounter  Medications  . escitalopram (LEXAPRO) 20 MG tablet    Sig: Take 1 tablet (20 mg total) by mouth daily.    Dispense:  90 tablet    Refill:  3   Patient Instructions                       Medicare Annual Wellness Visit  [facility] and the medical providers at [facility] strive to bring you the best medical care.  In doing so we not only want to address your current medical conditions and concerns but also to detect new  conditions early and prevent illness, disease and health-related problems.    Medicare offers a yearly Wellness Visit which allows our clinical staff to assess your need for preventative services including immunizations, lifestyle education, counseling to decrease risk of preventable diseases and screening for fall risk and other medical concerns.    This visit is provided free of charge (no copay) for all Medicare recipients. The clinical pharmacists at [facility] have begun to conduct these Wellness Visits which will also include a thorough review of all your medications.    As you primary medical provider recommend that you make an appointment for your Annual Wellness Visit if you have not done so already this year.  You may set up this appointment before you leave today or you may call back [phone] and schedule  an appointment.  Please make sure when you call that you mention that you are scheduling your Annual Wellness Visit with the clinical pharmacist so that the appointment may be made for the proper length of time.     Continue current medications. Continue good therapeutic lifestyle changes which include good diet and exercise. Fall precautions discussed with patient. If an FOBT was given today- please return it to our front desk. If you are over 86 years old - you may need Prevnar 3 or the adult Pneumonia vaccine.  **Flu shots are available--- please call and schedule a FLU-CLINIC appointment**  After your visit with [place] today you will receive a survey in the mail or online from [employer] regarding your care with [place]. Please take a moment to fill this out. Your feedback is very important to [place] as you can help [place] better understand your patient needs as well as improve your experience and satisfaction. WE CARE ABOUT YOU!!!   If the patient continues to have elbow pain and knee pain she should come to the office and get x-rays in 3-4 weeks otherwise, she should continue to take Tylenol and gradually increase her activity with these joints. She should follow-up with the cardiologist as planned She should continue to monitor blood pressures at home When she is able she needs to try to walk and get more exercise and especially as the weather improves Continue to watch sodium intake and take medications as doing   Arrie Senate MD

## 2015-11-27 ENCOUNTER — Other Ambulatory Visit: Payer: Self-pay | Admitting: Family Medicine

## 2015-12-09 ENCOUNTER — Other Ambulatory Visit: Payer: Self-pay | Admitting: Family Medicine

## 2015-12-10 ENCOUNTER — Encounter: Payer: Self-pay | Admitting: Family Medicine

## 2015-12-10 ENCOUNTER — Encounter (INDEPENDENT_AMBULATORY_CARE_PROVIDER_SITE_OTHER): Payer: Self-pay

## 2015-12-10 ENCOUNTER — Ambulatory Visit (INDEPENDENT_AMBULATORY_CARE_PROVIDER_SITE_OTHER): Payer: Medicare Other | Admitting: Family Medicine

## 2015-12-10 VITALS — BP 167/62 | HR 63 | Temp 97.0°F | Ht 61.0 in | Wt 169.2 lb

## 2015-12-10 DIAGNOSIS — J0101 Acute recurrent maxillary sinusitis: Secondary | ICD-10-CM

## 2015-12-10 MED ORDER — LEVOFLOXACIN 250 MG PO TABS: 500.0000 mg | ORAL_TABLET | Freq: Every day | ORAL | Status: DC

## 2015-12-10 MED ORDER — PSEUDOEPHEDRINE-GUAIFENESIN ER 60-600 MG PO TB12: 1.0000 | ORAL_TABLET | Freq: Two times a day (BID) | ORAL | Status: AC

## 2015-12-10 NOTE — Progress Notes (Signed)
Subjective:  Patient ID: Misty Smith, female    DOB: 1941/02/08  Age: 75 y.o. MRN: [id number]  CC: Sinusitis   HPI MACHEL LEEDHAM presents for Symptoms include congestion, facial pain, nasal congestion, no  fever, non productive cough, post nasal drip and sinus pressure with no fever, chills, night sweats or weight loss. Onset of symptoms was a few days ago, gradually worsening since that time. Pt.is drinking moderate amounts of fluids.      History Cosandra has a past medical history of Anxiety; Stress; Colovaginal fistula; Hypertension; Postmenopausal HRT (hormone replacement therapy); Hyperlipidemia; Perimenopausal vasomotor symptoms; Esophageal stricture; Subacute lupus erythematosus; Pre-diabetes; Fractured elbow (1970's); Bronchitis; Diabetes mellitus without complication ([place]); Depression; GERD (gastroesophageal reflux disease); H/O hiatal hernia; Arthritis; and Dysrhythmia.   She has past surgical history that includes abdominal bypass (1987); bilateral tubal ligtation (1972); Tonsillectomy; Abdominal hysterectomy; elbow (Right); Tubal ligation; Colonoscopy; and Cholecystectomy (N/A, 03/21/2014).   Her family history includes Breast cancer in her sister; Cancer in her brother and mother; Coronary artery disease in her father; Heart attack in her sister; Heart disease in her mother and sister; Hyperlipidemia in her sister; Hypertension in her brother, brother, father, mother, sister, and sister; Kidney disease in her father; Stroke in her sister and sister.She reports that she has never smoked. She has never used smokeless tobacco. She reports that she does not drink alcohol or use illicit drugs.    ROS Review of Systems  Constitutional: Negative for fever, chills, activity change and appetite change.  HENT: Positive for congestion, postnasal drip, rhinorrhea and sinus pressure. Negative for ear discharge, ear pain, hearing loss, nosebleeds, sneezing and trouble swallowing.     Respiratory: Negative for chest tightness and shortness of breath.   Cardiovascular: Negative for chest pain and palpitations.  Skin: Negative for rash.    Objective:  BP 167/62 mmHg  Pulse 63  Temp(Src) 97 F (36.1 C) (Oral)  Ht 5\' 1"  (1.549 m)  Wt 169 lb 3.2 oz (76.749 kg)  BMI 31.99 kg/m2  SpO2 96%  BP Readings from Last 3 Encounters:  12/10/15 167/62  11/09/15 136/69  06/22/15 130/62    Wt Readings from Last 3 Encounters:  12/10/15 169 lb 3.2 oz (76.749 kg)  11/09/15 167 lb (75.751 kg)  06/22/15 172 lb (78.019 kg)     Physical Exam  Constitutional: She appears well-developed and well-nourished.  HENT:  Head: Normocephalic and atraumatic.  Right Ear: Tympanic membrane and external ear normal. No decreased hearing is noted.  Left Ear: Tympanic membrane and external ear normal. No decreased hearing is noted.  Nose: Mucosal edema present. Right sinus exhibits no frontal sinus tenderness. Left sinus exhibits no frontal sinus tenderness.  Mouth/Throat: No oropharyngeal exudate or posterior oropharyngeal erythema.  Neck: No Brudzinski's sign noted.  Pulmonary/Chest: Breath sounds normal. No respiratory distress.  Lymphadenopathy:       Head (right side): No preauricular adenopathy present.       Head (left side): No preauricular adenopathy present.       Right cervical: No superficial cervical adenopathy present.      Left cervical: No superficial cervical adenopathy present.     Lab Results  Component Value Date   WBC 6.5 11/01/2015   HGB 12.6 01/30/2015   HCT 38.0 11/01/2015   PLT 214 11/01/2015   GLUCOSE 94 11/01/2015   CHOL 150 11/01/2015   TRIG 106 11/01/2015   HDL 63 11/01/2015   LDLCALC 62 05/16/2014   ALT 30 11/01/2015  AST 31 11/01/2015   NA 140 11/01/2015   K 4.8 11/01/2015   CL 100 11/01/2015   CREATININE 0.69 11/01/2015   BUN 12 11/01/2015   CO2 24 11/01/2015   TSH 1.790 11/01/2015   INR 1.0 12/23/2008   HGBA1C 6.0% 09/27/2014    Dg  Esophagus  02/26/2015  CLINICAL DATA:  Dysphagia.  Globus feeling. EXAM: ESOPHOGRAM / BARIUM SWALLOW / BARIUM TABLET STUDY TECHNIQUE: Combined double contrast and single contrast examination performed using effervescent crystals, thick barium liquid, and thin barium liquid. The patient was observed with fluoroscopy swallowing a 13 mm barium sulphate tablet. FLUOROSCOPY TIME:  Radiation Exposure Index (as provided by the fluoroscopic device): 93 D Gy cm2 If the device does not provide the exposure index: Fluoroscopy Time:  2 minutes and 36 seconds Number of Acquired Images:  None COMPARISON:  Chest CT of 12/29/2014 FINDINGS: Hypopharyngeal portion of the exam is unremarkable. Double contrast evaluation of the esophagus demonstrates no mucosal abnormality. Evaluation of primary peristalsis demonstrates an incomplete primary peristaltic wave with secondary escape wave and contrast stasis throughout the mid and lower thoracic esophagus. Full column evaluation of the esophagus demonstrates a small hiatal hernia with a mucosal ring at the gastroesophageal junction. Example series 33. Minimally delayed passage of 13 mm barium tablet at the gastroesophageal junction. This passes with subsequent swallows. 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 Small hiatal hernia with mucosal ring at the gastroesophageal junction. Felt unlikely to be obstructive. Electronically Signed   By: Abigail Miyamoto M.D.   On: 02/26/2015 13:40    Assessment & Plan:   Cipriana was seen today for sinusitis.  Diagnoses and all orders for this visit:  Acute recurrent maxillary sinusitis  Other orders -     pseudoephedrine-guaifenesin (MUCINEX D) 60-600 MG 12 hr tablet; Take 1 tablet by mouth every 12 (twelve) hours. As needed for congestion -     levofloxacin (LEVAQUIN) 250 MG tablet; Take 2 tablets (500 mg total) by mouth daily. For 10 days     I am having Ms. Alperin start on pseudoephedrine-guaifenesin and  levofloxacin. I am also having her maintain her ALPRAZolam, estradiol, cholecalciferol, Calcium Carb-Cholecalciferol, multivitamin, clobetasol, Halcinonide, desonide, aspirin EC, metoprolol tartrate, atorvastatin, esomeprazole, omega-3 acid ethyl esters, escitalopram, lisinopril, and pioglitazone.  Meds ordered this encounter  Medications  . pseudoephedrine-guaifenesin (MUCINEX D) 60-600 MG 12 hr tablet    Sig: Take 1 tablet by mouth every 12 (twelve) hours. As needed for congestion    Dispense:  20 tablet    Refill:  0  . levofloxacin (LEVAQUIN) 250 MG tablet    Sig: Take 2 tablets (500 mg total) by mouth daily. For 10 days    Dispense:  10 tablet    Refill:  0     Follow-up: No Follow-up on file.  Claretta Fraise, M.D.

## 2015-12-21 ENCOUNTER — Telehealth: Payer: Self-pay | Admitting: Family Medicine

## 2015-12-21 NOTE — Telephone Encounter (Signed)
Patient was requesting husbands results.

## 2016-01-02 DIAGNOSIS — M9901 Segmental and somatic dysfunction of cervical region: Secondary | ICD-10-CM | POA: Diagnosis not present

## 2016-01-02 DIAGNOSIS — M9903 Segmental and somatic dysfunction of lumbar region: Secondary | ICD-10-CM | POA: Diagnosis not present

## 2016-01-02 DIAGNOSIS — M9904 Segmental and somatic dysfunction of sacral region: Secondary | ICD-10-CM | POA: Diagnosis not present

## 2016-01-02 DIAGNOSIS — M9902 Segmental and somatic dysfunction of thoracic region: Secondary | ICD-10-CM | POA: Diagnosis not present

## 2016-01-02 DIAGNOSIS — M9905 Segmental and somatic dysfunction of pelvic region: Secondary | ICD-10-CM | POA: Diagnosis not present

## 2016-01-02 DIAGNOSIS — M531 Cervicobrachial syndrome: Secondary | ICD-10-CM | POA: Diagnosis not present

## 2016-01-07 DIAGNOSIS — M9905 Segmental and somatic dysfunction of pelvic region: Secondary | ICD-10-CM | POA: Diagnosis not present

## 2016-01-07 DIAGNOSIS — M9901 Segmental and somatic dysfunction of cervical region: Secondary | ICD-10-CM | POA: Diagnosis not present

## 2016-01-07 DIAGNOSIS — M9902 Segmental and somatic dysfunction of thoracic region: Secondary | ICD-10-CM | POA: Diagnosis not present

## 2016-01-07 DIAGNOSIS — M9904 Segmental and somatic dysfunction of sacral region: Secondary | ICD-10-CM | POA: Diagnosis not present

## 2016-01-07 DIAGNOSIS — M9903 Segmental and somatic dysfunction of lumbar region: Secondary | ICD-10-CM | POA: Diagnosis not present

## 2016-01-07 DIAGNOSIS — M531 Cervicobrachial syndrome: Secondary | ICD-10-CM | POA: Diagnosis not present

## 2016-01-09 DIAGNOSIS — M9903 Segmental and somatic dysfunction of lumbar region: Secondary | ICD-10-CM | POA: Diagnosis not present

## 2016-01-09 DIAGNOSIS — M9901 Segmental and somatic dysfunction of cervical region: Secondary | ICD-10-CM | POA: Diagnosis not present

## 2016-01-09 DIAGNOSIS — M9904 Segmental and somatic dysfunction of sacral region: Secondary | ICD-10-CM | POA: Diagnosis not present

## 2016-01-09 DIAGNOSIS — M531 Cervicobrachial syndrome: Secondary | ICD-10-CM | POA: Diagnosis not present

## 2016-01-09 DIAGNOSIS — M9905 Segmental and somatic dysfunction of pelvic region: Secondary | ICD-10-CM | POA: Diagnosis not present

## 2016-01-09 DIAGNOSIS — M9902 Segmental and somatic dysfunction of thoracic region: Secondary | ICD-10-CM | POA: Diagnosis not present

## 2016-01-10 DIAGNOSIS — M9905 Segmental and somatic dysfunction of pelvic region: Secondary | ICD-10-CM | POA: Diagnosis not present

## 2016-01-10 DIAGNOSIS — M9904 Segmental and somatic dysfunction of sacral region: Secondary | ICD-10-CM | POA: Diagnosis not present

## 2016-01-10 DIAGNOSIS — M531 Cervicobrachial syndrome: Secondary | ICD-10-CM | POA: Diagnosis not present

## 2016-01-10 DIAGNOSIS — M9903 Segmental and somatic dysfunction of lumbar region: Secondary | ICD-10-CM | POA: Diagnosis not present

## 2016-01-10 DIAGNOSIS — M9902 Segmental and somatic dysfunction of thoracic region: Secondary | ICD-10-CM | POA: Diagnosis not present

## 2016-01-10 DIAGNOSIS — M9901 Segmental and somatic dysfunction of cervical region: Secondary | ICD-10-CM | POA: Diagnosis not present

## 2016-01-14 DIAGNOSIS — M9905 Segmental and somatic dysfunction of pelvic region: Secondary | ICD-10-CM | POA: Diagnosis not present

## 2016-01-14 DIAGNOSIS — M9902 Segmental and somatic dysfunction of thoracic region: Secondary | ICD-10-CM | POA: Diagnosis not present

## 2016-01-14 DIAGNOSIS — M9901 Segmental and somatic dysfunction of cervical region: Secondary | ICD-10-CM | POA: Diagnosis not present

## 2016-01-14 DIAGNOSIS — L930 Discoid lupus erythematosus: Secondary | ICD-10-CM | POA: Diagnosis not present

## 2016-01-14 DIAGNOSIS — M25562 Pain in left knee: Secondary | ICD-10-CM | POA: Diagnosis not present

## 2016-01-14 DIAGNOSIS — L821 Other seborrheic keratosis: Secondary | ICD-10-CM | POA: Diagnosis not present

## 2016-01-14 DIAGNOSIS — D225 Melanocytic nevi of trunk: Secondary | ICD-10-CM | POA: Diagnosis not present

## 2016-01-14 DIAGNOSIS — L814 Other melanin hyperpigmentation: Secondary | ICD-10-CM | POA: Diagnosis not present

## 2016-01-14 DIAGNOSIS — M9904 Segmental and somatic dysfunction of sacral region: Secondary | ICD-10-CM | POA: Diagnosis not present

## 2016-01-14 DIAGNOSIS — D1801 Hemangioma of skin and subcutaneous tissue: Secondary | ICD-10-CM | POA: Diagnosis not present

## 2016-01-14 DIAGNOSIS — M9903 Segmental and somatic dysfunction of lumbar region: Secondary | ICD-10-CM | POA: Diagnosis not present

## 2016-01-14 DIAGNOSIS — M531 Cervicobrachial syndrome: Secondary | ICD-10-CM | POA: Diagnosis not present

## 2016-01-15 ENCOUNTER — Other Ambulatory Visit: Payer: Self-pay | Admitting: Family Medicine

## 2016-01-15 DIAGNOSIS — M9904 Segmental and somatic dysfunction of sacral region: Secondary | ICD-10-CM | POA: Diagnosis not present

## 2016-01-15 DIAGNOSIS — M25562 Pain in left knee: Secondary | ICD-10-CM | POA: Diagnosis not present

## 2016-01-15 DIAGNOSIS — M9901 Segmental and somatic dysfunction of cervical region: Secondary | ICD-10-CM | POA: Diagnosis not present

## 2016-01-15 DIAGNOSIS — M531 Cervicobrachial syndrome: Secondary | ICD-10-CM | POA: Diagnosis not present

## 2016-01-15 DIAGNOSIS — M9905 Segmental and somatic dysfunction of pelvic region: Secondary | ICD-10-CM | POA: Diagnosis not present

## 2016-01-15 DIAGNOSIS — M9903 Segmental and somatic dysfunction of lumbar region: Secondary | ICD-10-CM | POA: Diagnosis not present

## 2016-01-15 DIAGNOSIS — M9902 Segmental and somatic dysfunction of thoracic region: Secondary | ICD-10-CM | POA: Diagnosis not present

## 2016-01-17 DIAGNOSIS — M9904 Segmental and somatic dysfunction of sacral region: Secondary | ICD-10-CM | POA: Diagnosis not present

## 2016-01-17 DIAGNOSIS — M25562 Pain in left knee: Secondary | ICD-10-CM | POA: Diagnosis not present

## 2016-01-17 DIAGNOSIS — M9903 Segmental and somatic dysfunction of lumbar region: Secondary | ICD-10-CM | POA: Diagnosis not present

## 2016-01-17 DIAGNOSIS — M9902 Segmental and somatic dysfunction of thoracic region: Secondary | ICD-10-CM | POA: Diagnosis not present

## 2016-01-17 DIAGNOSIS — M9905 Segmental and somatic dysfunction of pelvic region: Secondary | ICD-10-CM | POA: Diagnosis not present

## 2016-01-17 DIAGNOSIS — M531 Cervicobrachial syndrome: Secondary | ICD-10-CM | POA: Diagnosis not present

## 2016-01-17 DIAGNOSIS — M9901 Segmental and somatic dysfunction of cervical region: Secondary | ICD-10-CM | POA: Diagnosis not present

## 2016-01-21 ENCOUNTER — Encounter: Payer: Self-pay | Admitting: *Deleted

## 2016-01-21 DIAGNOSIS — M25562 Pain in left knee: Secondary | ICD-10-CM | POA: Diagnosis not present

## 2016-01-21 DIAGNOSIS — M9901 Segmental and somatic dysfunction of cervical region: Secondary | ICD-10-CM | POA: Diagnosis not present

## 2016-01-21 DIAGNOSIS — M9904 Segmental and somatic dysfunction of sacral region: Secondary | ICD-10-CM | POA: Diagnosis not present

## 2016-01-21 DIAGNOSIS — M531 Cervicobrachial syndrome: Secondary | ICD-10-CM | POA: Diagnosis not present

## 2016-01-21 DIAGNOSIS — M9902 Segmental and somatic dysfunction of thoracic region: Secondary | ICD-10-CM | POA: Diagnosis not present

## 2016-01-21 DIAGNOSIS — M9905 Segmental and somatic dysfunction of pelvic region: Secondary | ICD-10-CM | POA: Diagnosis not present

## 2016-01-21 DIAGNOSIS — M9903 Segmental and somatic dysfunction of lumbar region: Secondary | ICD-10-CM | POA: Diagnosis not present

## 2016-01-22 NOTE — Progress Notes (Signed)
HPI: FU palpitations and hypertension. Seen in the past secondary to hypertension, hyperlipidemia, and palpitations felt secondary to PVCs. She had a syncopal episode in April of 2010 that we felt was most likely vagal. An echocardiogram showed normal LV function. A Myoview showed an ejection fraction of 80% and normal perfusion. These were performed on February 20, 2009. Chest CT 4/15 showed nodule followed by primary care; also coronary calcification. Since I last saw her, the patient has dyspnea with more extreme activities but not with routine activities. It is relieved with rest. It is not associated with chest pain. There is no orthopnea, PND or pedal edema. There is no syncope or palpitations. There is no exertional chest pain.   Current Outpatient Prescriptions  Medication Sig Dispense Refill  . ALPRAZolam (XANAX) 0.25 MG tablet Take 0.25 mg by mouth at bedtime as needed for anxiety.     Marland Kitchen aspirin EC 81 MG tablet Take 1 tablet (81 mg total) by mouth daily. 90 tablet 3  . atorvastatin (LIPITOR) 80 MG tablet TAKE AS DIRECTED 90 tablet 0  . Calcium Carb-Cholecalciferol (CALTRATE 600+D) 600-800 MG-UNIT TABS Take 1 tablet by mouth every evening.     . cholecalciferol (VITAMIN D) 1000 UNITS tablet Take 2,000 Units by mouth daily.    . clobetasol (TEMOVATE) 0.05 % external solution Apply 1 application topically 2 (two) times daily.    Marland Kitchen desonide (DESOWEN) 0.05 % cream Apply 1 application topically 2 (two) times daily as needed (for rash).     Marland Kitchen escitalopram (LEXAPRO) 20 MG tablet Take 1 tablet (20 mg total) by mouth daily. 90 tablet 3  . esomeprazole (NEXIUM) 40 MG capsule TAKE 1 CAPSULE BY MOUTH ONCE A DAY 90 capsule 1  . estradiol (ESTRACE VAGINAL) 0.1 MG/GM vaginal cream Place 2 g vaginally See admin instructions. Place 2 g vaginally 2-3 times weekly.    . Halcinonide (HALOG) 0.1 % CREA Apply 1 application topically 2 (two) times daily.    Marland Kitchen levofloxacin (LEVAQUIN) 250 MG tablet Take 2  tablets (500 mg total) by mouth daily. For 10 days 10 tablet 0  . lisinopril (PRINIVIL,ZESTRIL) 20 MG tablet TAKE 1 TABLET (20 MG TOTAL) BY MOUTH DAILY. 90 tablet 1  . metoprolol tartrate (LOPRESSOR) 25 MG tablet 50 mg BID - per pt    . Multiple Vitamin (MULTIVITAMIN) tablet Take 1 tablet by mouth every morning.     Marland Kitchen omega-3 acid ethyl esters (LOVAZA) 1 g capsule TAKE 2 CAPSULES BY MOUTH TWICE A DAY 360 capsule 0  . pioglitazone (ACTOS) 30 MG tablet TAKE 1 TABLET (30 MG TOTAL) BY MOUTH DAILY. 90 tablet 0   No current facility-administered medications for this visit.     Past Medical History  Diagnosis Date  . Anxiety   . Stress   . Colovaginal fistula   . Hypertension   . Postmenopausal HRT (hormone replacement therapy)   . Hyperlipidemia   . Perimenopausal vasomotor symptoms   . Esophageal stricture   . Subacute lupus erythematosus     Dr. Sol Blazing    . Pre-diabetes   . Fractured elbow 1970's  . Bronchitis   . Diabetes mellitus without complication ([place])   . Depression   . GERD (gastroesophageal reflux disease)   . H/O hiatal hernia   . Arthritis   . Dysrhythmia     palpitations, occasional PVCs; Dr Stanford Breed cardiologist    Past Surgical History  Procedure Laterality Date  . Abdominal bypass  1987  fibroids   . Bilateral tubal ligtation  1972  . Tonsillectomy    . Abdominal hysterectomy      partial  . Elbow Right     surgery after fracture of elbow  . Tubal ligation    . Colonoscopy    . Cholecystectomy N/A 03/21/2014    Procedure: LAPAROSCOPIC CHOLECYSTECTOMY WITH INTRAOPERATIVE CHOLANGIOGRAM;  Surgeon: Shann Medal, MD;  Location: [place];  Service: General;  Laterality: N/A;    Social History   Social History  . Marital Status: Married    Spouse Name: Milbert Coulter  . Number of Children: N/A  . Years of Education: N/A   Occupational History  . Not on file.   Social History Main Topics  . Smoking status: Never Smoker   . Smokeless tobacco: Never Used  .  Alcohol Use: No  . Drug Use: No  . Sexual Activity: No   Other Topics Concern  . Not on file   Social History Narrative    Family History  Problem Relation Age of Onset  . Cancer Mother     laryngeal   . Hypertension Mother   . Heart disease Mother   . Hypertension Father   . Coronary artery disease Father   . Kidney disease Father   . Breast cancer Sister   . Hypertension Sister   . Heart attack Sister   . Heart disease Sister   . Stroke Sister   . Stroke Sister   . Hypertension Brother   . Cancer Brother     liver  . Hypertension Sister   . Hyperlipidemia Sister   . Hypertension Brother     ROS: no fevers or chills, productive cough, hemoptysis, dysphasia, odynophagia, melena, hematochezia, dysuria, hematuria, rash, seizure activity, orthopnea, PND, pedal edema, claudication. Remaining systems are negative.  Physical Exam: Well-developed well-nourished in no acute distress.  Skin is warm and dry.  HEENT is normal.  Neck is supple.  Chest is clear to auscultation with normal expansion.  Cardiovascular exam is regular rate and rhythm.  Abdominal exam nontender or distended. No masses palpated. Extremities show no edema. neuro grossly intact  ECG Sinus bradycardia at a rate of 51. No ST changes.

## 2016-01-23 DIAGNOSIS — M531 Cervicobrachial syndrome: Secondary | ICD-10-CM | POA: Diagnosis not present

## 2016-01-23 DIAGNOSIS — M9904 Segmental and somatic dysfunction of sacral region: Secondary | ICD-10-CM | POA: Diagnosis not present

## 2016-01-23 DIAGNOSIS — M9902 Segmental and somatic dysfunction of thoracic region: Secondary | ICD-10-CM | POA: Diagnosis not present

## 2016-01-23 DIAGNOSIS — M9903 Segmental and somatic dysfunction of lumbar region: Secondary | ICD-10-CM | POA: Diagnosis not present

## 2016-01-23 DIAGNOSIS — M9905 Segmental and somatic dysfunction of pelvic region: Secondary | ICD-10-CM | POA: Diagnosis not present

## 2016-01-23 DIAGNOSIS — M9901 Segmental and somatic dysfunction of cervical region: Secondary | ICD-10-CM | POA: Diagnosis not present

## 2016-01-23 DIAGNOSIS — M25562 Pain in left knee: Secondary | ICD-10-CM | POA: Diagnosis not present

## 2016-01-24 ENCOUNTER — Ambulatory Visit (INDEPENDENT_AMBULATORY_CARE_PROVIDER_SITE_OTHER): Payer: Medicare Other | Admitting: Cardiology

## 2016-01-24 ENCOUNTER — Encounter: Payer: Self-pay | Admitting: Cardiology

## 2016-01-24 VITALS — BP 172/75 | HR 50 | Ht 61.0 in | Wt 168.4 lb

## 2016-01-24 DIAGNOSIS — I2510 Atherosclerotic heart disease of native coronary artery without angina pectoris: Secondary | ICD-10-CM

## 2016-01-24 DIAGNOSIS — I2583 Coronary atherosclerosis due to lipid rich plaque: Secondary | ICD-10-CM

## 2016-01-24 DIAGNOSIS — R002 Palpitations: Secondary | ICD-10-CM | POA: Diagnosis not present

## 2016-01-24 DIAGNOSIS — I10 Essential (primary) hypertension: Secondary | ICD-10-CM

## 2016-01-24 DIAGNOSIS — E785 Hyperlipidemia, unspecified: Secondary | ICD-10-CM

## 2016-01-24 NOTE — Assessment & Plan Note (Signed)
Palpitations are reasonably well controlled. ContinueBeta blocker.

## 2016-01-24 NOTE — Assessment & Plan Note (Signed)
Continue statin. Lipids and liver monitored by primary care. 

## 2016-01-24 NOTE — Assessment & Plan Note (Signed)
Based on previous calcium on CT scan. Continue aspirin and statin.

## 2016-01-24 NOTE — Patient Instructions (Signed)
Your physician wants you to follow-up in: ONE YEAR WITH DR CRENSHAW You will receive a reminder letter in the mail two months in advance. If you don't receive a letter, please call our office to schedule the follow-up appointment.   If you need a refill on your cardiac medications before your next appointment, please call your pharmacy.  

## 2016-01-24 NOTE — Assessment & Plan Note (Signed)
Blood pressure is mildly elevated today. However she states it is typically controlled. Continue present medications and monitor at home. If it remains elevated we will increase lisinopril.

## 2016-01-25 ENCOUNTER — Other Ambulatory Visit: Payer: Self-pay | Admitting: Family Medicine

## 2016-01-29 DIAGNOSIS — M9904 Segmental and somatic dysfunction of sacral region: Secondary | ICD-10-CM | POA: Diagnosis not present

## 2016-01-29 DIAGNOSIS — M9905 Segmental and somatic dysfunction of pelvic region: Secondary | ICD-10-CM | POA: Diagnosis not present

## 2016-01-29 DIAGNOSIS — M9902 Segmental and somatic dysfunction of thoracic region: Secondary | ICD-10-CM | POA: Diagnosis not present

## 2016-01-29 DIAGNOSIS — M9903 Segmental and somatic dysfunction of lumbar region: Secondary | ICD-10-CM | POA: Diagnosis not present

## 2016-01-29 DIAGNOSIS — M25562 Pain in left knee: Secondary | ICD-10-CM | POA: Diagnosis not present

## 2016-01-29 DIAGNOSIS — M531 Cervicobrachial syndrome: Secondary | ICD-10-CM | POA: Diagnosis not present

## 2016-01-29 DIAGNOSIS — M9901 Segmental and somatic dysfunction of cervical region: Secondary | ICD-10-CM | POA: Diagnosis not present

## 2016-01-31 DIAGNOSIS — M9901 Segmental and somatic dysfunction of cervical region: Secondary | ICD-10-CM | POA: Diagnosis not present

## 2016-01-31 DIAGNOSIS — M25562 Pain in left knee: Secondary | ICD-10-CM | POA: Diagnosis not present

## 2016-01-31 DIAGNOSIS — M9902 Segmental and somatic dysfunction of thoracic region: Secondary | ICD-10-CM | POA: Diagnosis not present

## 2016-01-31 DIAGNOSIS — M531 Cervicobrachial syndrome: Secondary | ICD-10-CM | POA: Diagnosis not present

## 2016-01-31 DIAGNOSIS — M9904 Segmental and somatic dysfunction of sacral region: Secondary | ICD-10-CM | POA: Diagnosis not present

## 2016-01-31 DIAGNOSIS — M9905 Segmental and somatic dysfunction of pelvic region: Secondary | ICD-10-CM | POA: Diagnosis not present

## 2016-01-31 DIAGNOSIS — M9903 Segmental and somatic dysfunction of lumbar region: Secondary | ICD-10-CM | POA: Diagnosis not present

## 2016-01-31 IMAGING — CT CT CHEST W/O CM
3 of 4 series · 17 of 30 positions shown, 19 images · non-contrast
Comparison: 12/07/2012

CLINICAL DATA: Followup peritracheal lesion.

EXAM:
CT CHEST WITHOUT CONTRAST
TECHNIQUE: Multidetector CT imaging of the chest was performed following the
standard protocol without IV contrast.

[Series 3: chest w/o · axial · non-contrast · 0.71mm/px · z∈[-276,-76]mm · 5 of 62 slices shown, 7 images]
[im 11/62  mediastinal]
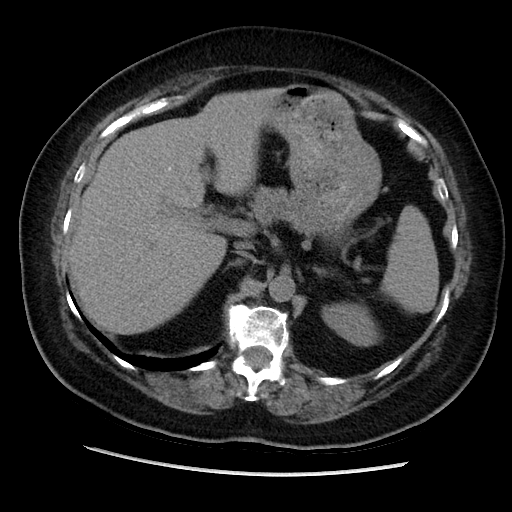
[im 11/62  lung]
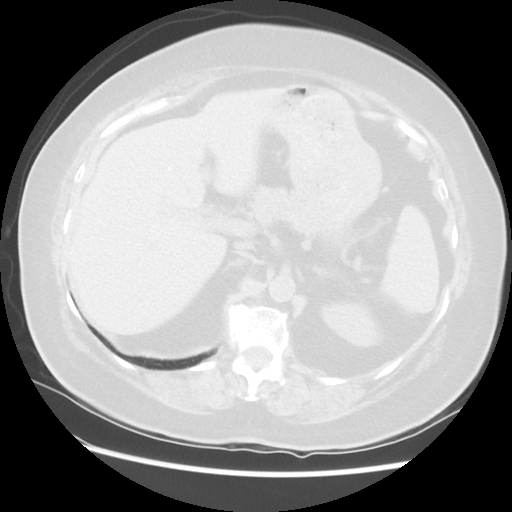
[im 21/62  lung]
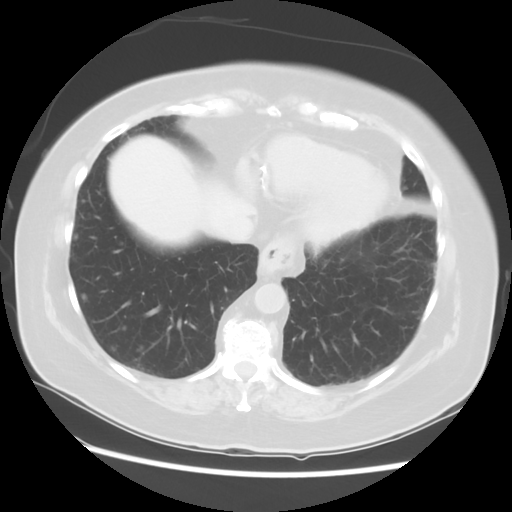
[im 31/62  lung]
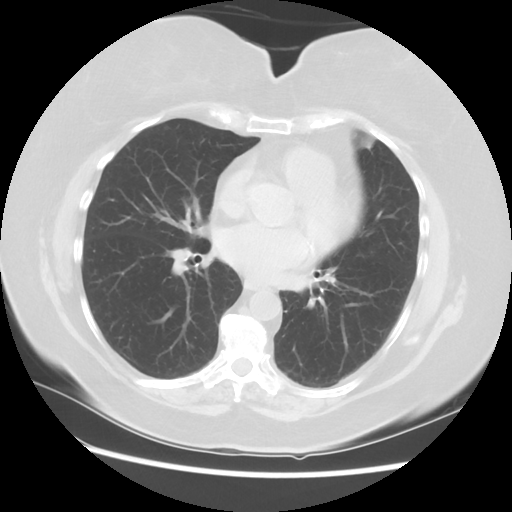
[im 41/62  lung]
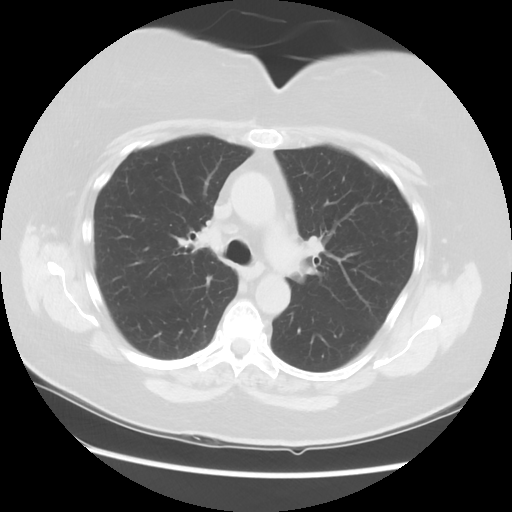
[im 51/62  mediastinal]
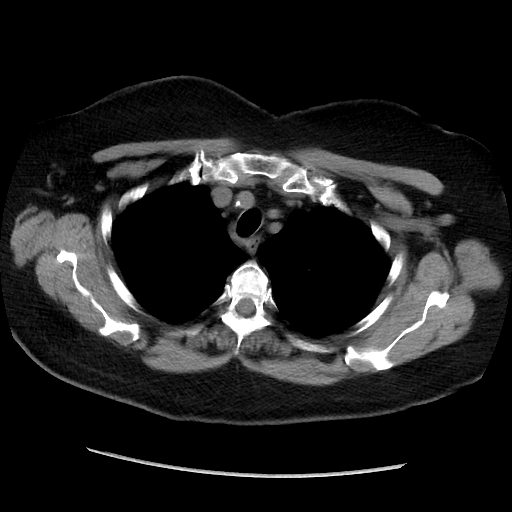
[im 51/62  lung]
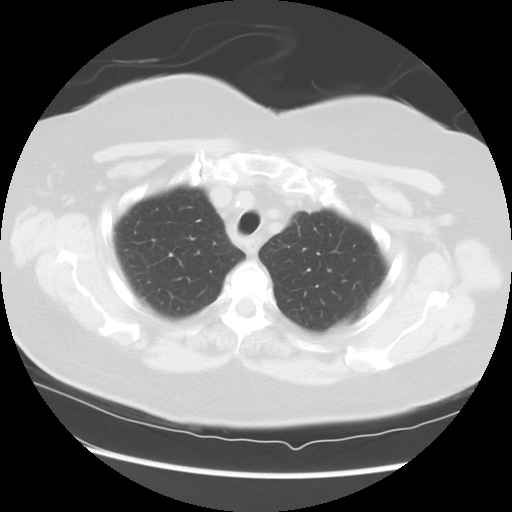

[Series 4: lung windows · axial · 0.71mm/px · z∈[-266,-86]mm · 4 of 62 slices shown]
[im 13/62  lung]
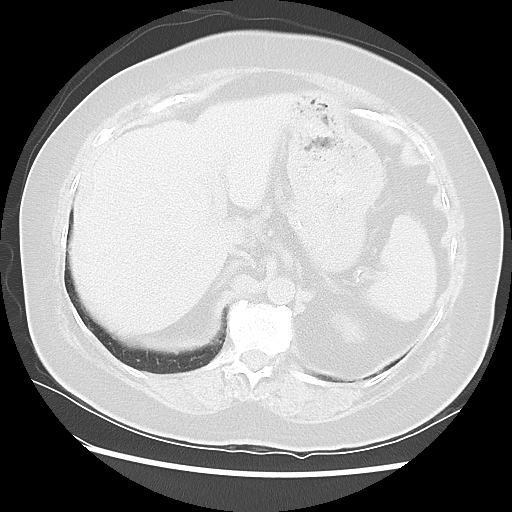
[im 25/62  lung]
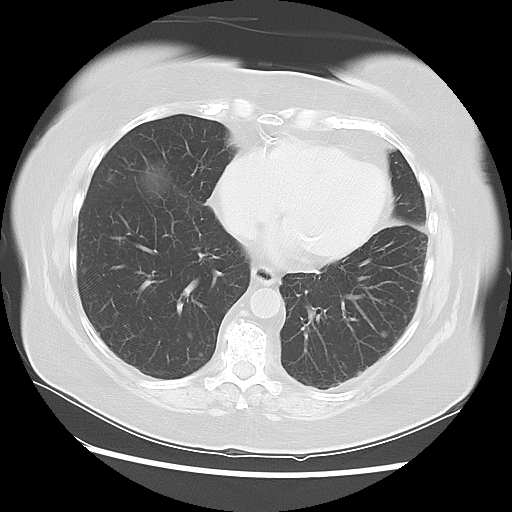
[im 37/62  lung]
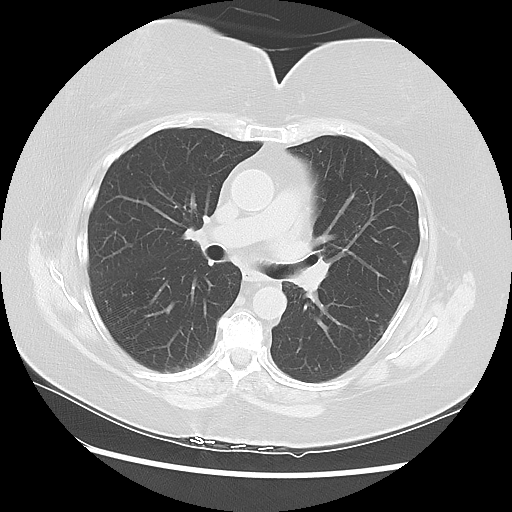
[im 49/62  lung]
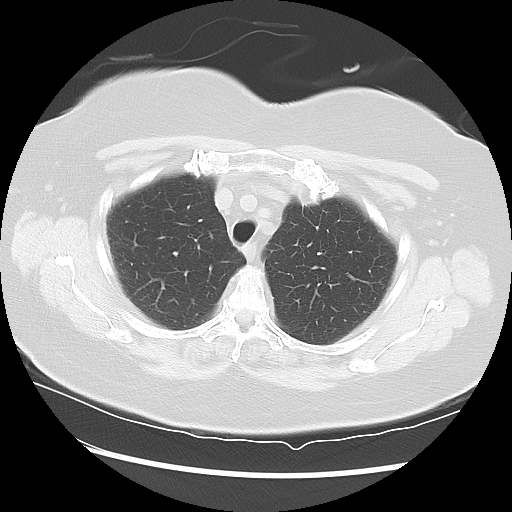

[Series 602: sagittal body · sagittal · 0.71mm/px · 8 of 146 slices shown]
[im 11/146  mediastinal]
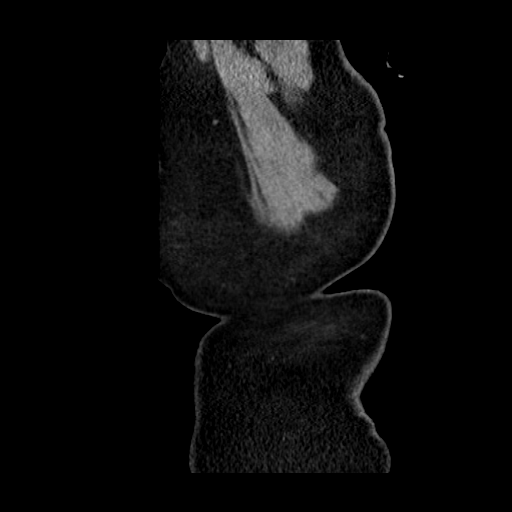
[im 32/146  mediastinal]
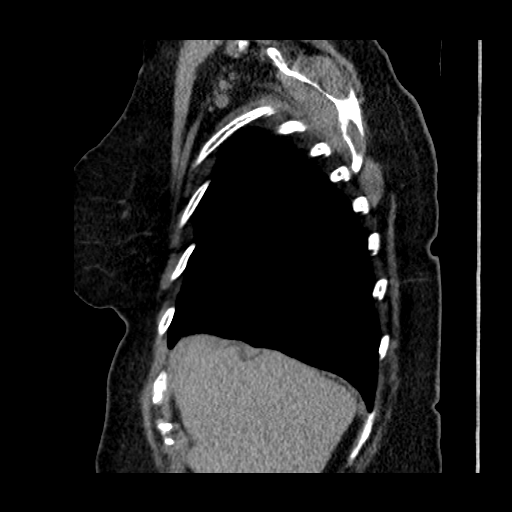
[im 52/146  mediastinal]
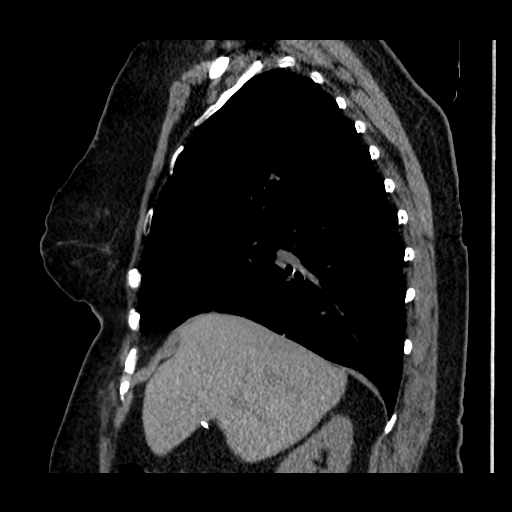
[im 63/146  mediastinal]
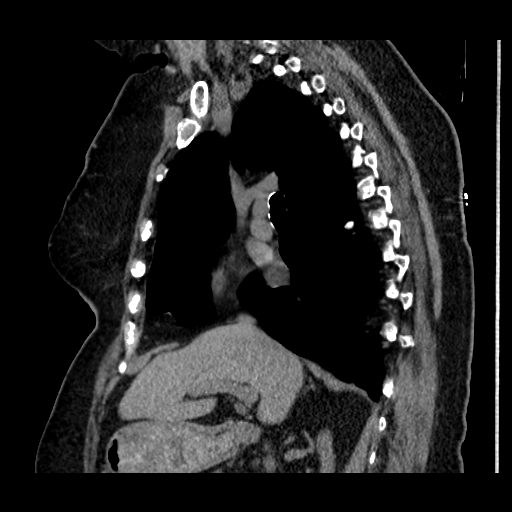
[im 83/146  mediastinal]
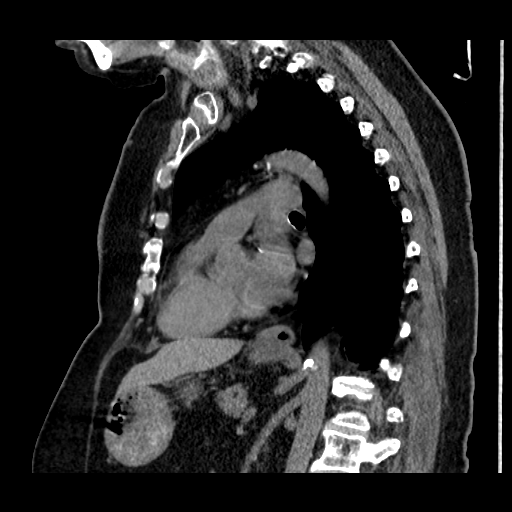
[im 94/146  mediastinal]
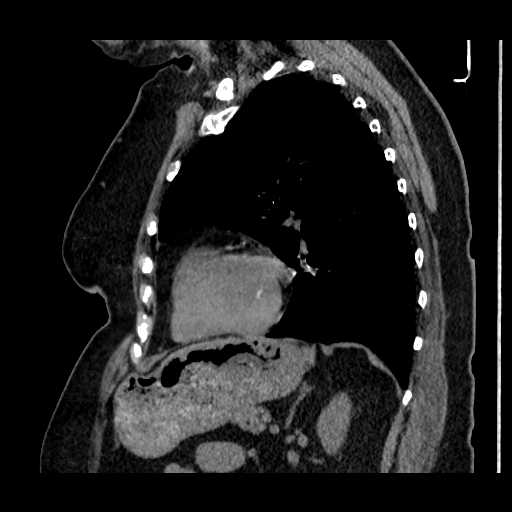
[im 114/146  mediastinal]
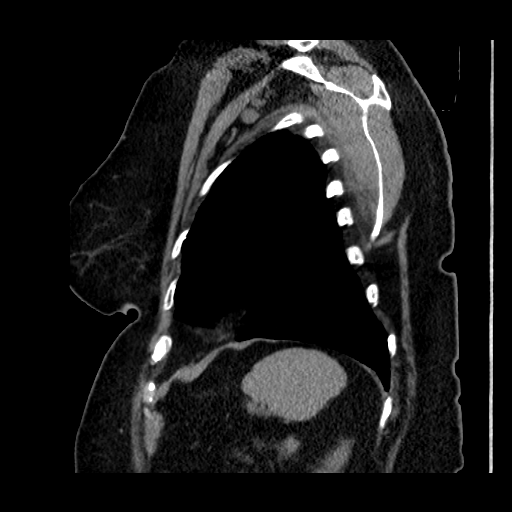
[im 135/146  mediastinal]
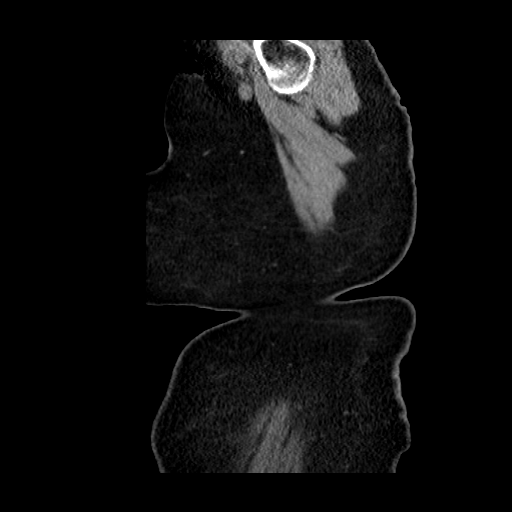

[17 of 30 positions shown; findings below may reference images not displayed]

FINDINGS: THORACIC INLET/BODY WALL:

Stable predominantly high density 22 by 29 mm nodule in the
tracheoesophageal groove at the thoracic inlet, measured on images 9
axial and 65 coronal. Internal calcification and small cystic spaces
are noted. This is likely a thyroidal, with enlargement from
underlying nodule. Stability over 2 years is compatible with a
benign or indolent process.

MEDIASTINUM:

Extensive coronary atherosclerosis. Normal heart size. No
pericardial effusion. No aortic enlargement.

LUNG WINDOWS:

Small indistinct nodules in the peripheral bilateral lower lobes is
stable over 2 years and considered benign. No new or suspicious
pulmonary nodule.

UPPER ABDOMEN:

Cholecystectomy.

OSSEOUS:

No acute fracture.  No suspicious lytic or blastic lesions.
IMPRESSION: 1. 29 mm right peritracheal nodule that is likely thyroidal. 2 year
stability is compatible with a benign/indolent process.
2. Continued stability of multiple small pulmonary nodules.

## 2016-02-04 DIAGNOSIS — M9902 Segmental and somatic dysfunction of thoracic region: Secondary | ICD-10-CM | POA: Diagnosis not present

## 2016-02-04 DIAGNOSIS — M9905 Segmental and somatic dysfunction of pelvic region: Secondary | ICD-10-CM | POA: Diagnosis not present

## 2016-02-04 DIAGNOSIS — M25562 Pain in left knee: Secondary | ICD-10-CM | POA: Diagnosis not present

## 2016-02-04 DIAGNOSIS — M9901 Segmental and somatic dysfunction of cervical region: Secondary | ICD-10-CM | POA: Diagnosis not present

## 2016-02-04 DIAGNOSIS — M9904 Segmental and somatic dysfunction of sacral region: Secondary | ICD-10-CM | POA: Diagnosis not present

## 2016-02-04 DIAGNOSIS — M9903 Segmental and somatic dysfunction of lumbar region: Secondary | ICD-10-CM | POA: Diagnosis not present

## 2016-02-04 DIAGNOSIS — M531 Cervicobrachial syndrome: Secondary | ICD-10-CM | POA: Diagnosis not present

## 2016-02-06 DIAGNOSIS — M9904 Segmental and somatic dysfunction of sacral region: Secondary | ICD-10-CM | POA: Diagnosis not present

## 2016-02-06 DIAGNOSIS — M25562 Pain in left knee: Secondary | ICD-10-CM | POA: Diagnosis not present

## 2016-02-06 DIAGNOSIS — M9902 Segmental and somatic dysfunction of thoracic region: Secondary | ICD-10-CM | POA: Diagnosis not present

## 2016-02-06 DIAGNOSIS — M531 Cervicobrachial syndrome: Secondary | ICD-10-CM | POA: Diagnosis not present

## 2016-02-06 DIAGNOSIS — M9903 Segmental and somatic dysfunction of lumbar region: Secondary | ICD-10-CM | POA: Diagnosis not present

## 2016-02-06 DIAGNOSIS — M9905 Segmental and somatic dysfunction of pelvic region: Secondary | ICD-10-CM | POA: Diagnosis not present

## 2016-02-06 DIAGNOSIS — M9901 Segmental and somatic dysfunction of cervical region: Secondary | ICD-10-CM | POA: Diagnosis not present

## 2016-02-11 DIAGNOSIS — M25562 Pain in left knee: Secondary | ICD-10-CM | POA: Diagnosis not present

## 2016-02-11 DIAGNOSIS — M9905 Segmental and somatic dysfunction of pelvic region: Secondary | ICD-10-CM | POA: Diagnosis not present

## 2016-02-11 DIAGNOSIS — M9904 Segmental and somatic dysfunction of sacral region: Secondary | ICD-10-CM | POA: Diagnosis not present

## 2016-02-11 DIAGNOSIS — M9902 Segmental and somatic dysfunction of thoracic region: Secondary | ICD-10-CM | POA: Diagnosis not present

## 2016-02-11 DIAGNOSIS — M9903 Segmental and somatic dysfunction of lumbar region: Secondary | ICD-10-CM | POA: Diagnosis not present

## 2016-02-11 DIAGNOSIS — M9901 Segmental and somatic dysfunction of cervical region: Secondary | ICD-10-CM | POA: Diagnosis not present

## 2016-02-11 DIAGNOSIS — M531 Cervicobrachial syndrome: Secondary | ICD-10-CM | POA: Diagnosis not present

## 2016-02-18 DIAGNOSIS — M9905 Segmental and somatic dysfunction of pelvic region: Secondary | ICD-10-CM | POA: Diagnosis not present

## 2016-02-18 DIAGNOSIS — M9904 Segmental and somatic dysfunction of sacral region: Secondary | ICD-10-CM | POA: Diagnosis not present

## 2016-02-18 DIAGNOSIS — M9903 Segmental and somatic dysfunction of lumbar region: Secondary | ICD-10-CM | POA: Diagnosis not present

## 2016-02-18 DIAGNOSIS — M9902 Segmental and somatic dysfunction of thoracic region: Secondary | ICD-10-CM | POA: Diagnosis not present

## 2016-02-18 DIAGNOSIS — M25562 Pain in left knee: Secondary | ICD-10-CM | POA: Diagnosis not present

## 2016-02-18 DIAGNOSIS — M9901 Segmental and somatic dysfunction of cervical region: Secondary | ICD-10-CM | POA: Diagnosis not present

## 2016-02-18 DIAGNOSIS — M531 Cervicobrachial syndrome: Secondary | ICD-10-CM | POA: Diagnosis not present

## 2016-02-21 DIAGNOSIS — M7552 Bursitis of left shoulder: Secondary | ICD-10-CM | POA: Diagnosis not present

## 2016-02-21 DIAGNOSIS — M7061 Trochanteric bursitis, right hip: Secondary | ICD-10-CM | POA: Diagnosis not present

## 2016-02-21 DIAGNOSIS — M7542 Impingement syndrome of left shoulder: Secondary | ICD-10-CM | POA: Diagnosis not present

## 2016-02-21 DIAGNOSIS — M1712 Unilateral primary osteoarthritis, left knee: Secondary | ICD-10-CM | POA: Diagnosis not present

## 2016-02-25 ENCOUNTER — Encounter: Payer: Medicare Other | Admitting: *Deleted

## 2016-02-25 ENCOUNTER — Ambulatory Visit: Payer: Self-pay | Admitting: Pharmacist

## 2016-02-25 DIAGNOSIS — M9903 Segmental and somatic dysfunction of lumbar region: Secondary | ICD-10-CM | POA: Diagnosis not present

## 2016-02-25 DIAGNOSIS — M9905 Segmental and somatic dysfunction of pelvic region: Secondary | ICD-10-CM | POA: Diagnosis not present

## 2016-02-25 DIAGNOSIS — Z1231 Encounter for screening mammogram for malignant neoplasm of breast: Secondary | ICD-10-CM | POA: Diagnosis not present

## 2016-02-25 DIAGNOSIS — M9904 Segmental and somatic dysfunction of sacral region: Secondary | ICD-10-CM | POA: Diagnosis not present

## 2016-02-25 DIAGNOSIS — M9902 Segmental and somatic dysfunction of thoracic region: Secondary | ICD-10-CM | POA: Diagnosis not present

## 2016-02-25 DIAGNOSIS — M9901 Segmental and somatic dysfunction of cervical region: Secondary | ICD-10-CM | POA: Diagnosis not present

## 2016-02-25 DIAGNOSIS — M531 Cervicobrachial syndrome: Secondary | ICD-10-CM | POA: Diagnosis not present

## 2016-02-25 DIAGNOSIS — M25562 Pain in left knee: Secondary | ICD-10-CM | POA: Diagnosis not present

## 2016-03-05 ENCOUNTER — Other Ambulatory Visit: Payer: Self-pay | Admitting: Family Medicine

## 2016-03-05 DIAGNOSIS — M25511 Pain in right shoulder: Secondary | ICD-10-CM | POA: Diagnosis not present

## 2016-03-08 ENCOUNTER — Other Ambulatory Visit: Payer: Self-pay | Admitting: Family Medicine

## 2016-03-11 ENCOUNTER — Other Ambulatory Visit: Payer: Medicare Other

## 2016-03-11 DIAGNOSIS — I10 Essential (primary) hypertension: Secondary | ICD-10-CM

## 2016-03-11 DIAGNOSIS — R7303 Prediabetes: Secondary | ICD-10-CM | POA: Diagnosis not present

## 2016-03-11 DIAGNOSIS — E559 Vitamin D deficiency, unspecified: Secondary | ICD-10-CM

## 2016-03-11 DIAGNOSIS — E041 Nontoxic single thyroid nodule: Secondary | ICD-10-CM | POA: Diagnosis not present

## 2016-03-11 DIAGNOSIS — E785 Hyperlipidemia, unspecified: Secondary | ICD-10-CM | POA: Diagnosis not present

## 2016-03-11 LAB — BAYER DCA HB A1C WAIVED: HB A1C (BAYER DCA - WAIVED): 6.4 % (ref <7.0)

## 2016-03-12 LAB — NMR, LIPOPROFILE
Cholesterol: 145 mg/dL (ref 100–199)
HDL CHOLESTEROL BY NMR: 54 mg/dL (ref >39)
HDL Particle Number: 31.2 umol/L (ref >=30.5)
LDL PARTICLE NUMBER: 961 nmol/L (ref <1000)
LDL Size: 21.2 nm (ref >20.5)
LDL-C: 67 mg/dL (ref 0–99)
LP-IR Score: 33 (ref <=45)
Small LDL Particle Number: 255 nmol/L (ref <=527)
TRIGLYCERIDES BY NMR: 122 mg/dL (ref 0–149)

## 2016-03-12 LAB — CBC WITH DIFFERENTIAL/PLATELET
BASOS ABS: 0 10*3/uL (ref 0.0–0.2)
Basos: 0 %
EOS (ABSOLUTE): 0.1 10*3/uL (ref 0.0–0.4)
Eos: 2 %
Hematocrit: 37.6 % (ref 34.0–46.6)
Hemoglobin: 12.5 g/dL (ref 11.1–15.9)
IMMATURE GRANS (ABS): 0 10*3/uL (ref 0.0–0.1)
Immature Granulocytes: 0 %
LYMPHS ABS: 1.9 10*3/uL (ref 0.7–3.1)
LYMPHS: 23 %
MCH: 30.3 pg (ref 26.6–33.0)
MCHC: 33.2 g/dL (ref 31.5–35.7)
MCV: 91 fL (ref 79–97)
Monocytes Absolute: 0.7 10*3/uL (ref 0.1–0.9)
Monocytes: 9 %
NEUTROS ABS: 5.3 10*3/uL (ref 1.4–7.0)
Neutrophils: 66 %
PLATELETS: 176 10*3/uL (ref 150–379)
RBC: 4.13 x10E6/uL (ref 3.77–5.28)
RDW: 14.7 % (ref 12.3–15.4)
WBC: 8 10*3/uL (ref 3.4–10.8)

## 2016-03-12 LAB — BMP8+EGFR
BUN/Creatinine Ratio: 17 (ref 12–28)
BUN: 14 mg/dL (ref 8–27)
CALCIUM: 9.9 mg/dL (ref 8.7–10.3)
CHLORIDE: 98 mmol/L (ref 96–106)
CO2: 23 mmol/L (ref 18–29)
Creatinine, Ser: 0.81 mg/dL (ref 0.57–1.00)
GFR calc non Af Amer: 70 mL/min/{1.73_m2} (ref >59)
GFR, EST AFRICAN AMERICAN: 81 mL/min/{1.73_m2} (ref >59)
GLUCOSE: 114 mg/dL — AB (ref 65–99)
POTASSIUM: 5.3 mmol/L — AB (ref 3.5–5.2)
Sodium: 141 mmol/L (ref 134–144)

## 2016-03-12 LAB — HEPATIC FUNCTION PANEL
ALBUMIN: 4 g/dL (ref 3.5–4.8)
ALT: 31 IU/L (ref 0–32)
AST: 33 IU/L (ref 0–40)
Alkaline Phosphatase: 74 IU/L (ref 39–117)
Bilirubin Total: 0.4 mg/dL (ref 0.0–1.2)
Bilirubin, Direct: 0.13 mg/dL (ref 0.00–0.40)
TOTAL PROTEIN: 6.6 g/dL (ref 6.0–8.5)

## 2016-03-12 LAB — THYROID PANEL WITH TSH
FREE THYROXINE INDEX: 2.5 (ref 1.2–4.9)
T3 UPTAKE RATIO: 30 % (ref 24–39)
T4 TOTAL: 8.3 ug/dL (ref 4.5–12.0)
TSH: 1.58 u[IU]/mL (ref 0.450–4.500)

## 2016-03-12 LAB — VITAMIN D 25 HYDROXY (VIT D DEFICIENCY, FRACTURES): VIT D 25 HYDROXY: 48.2 ng/mL (ref 30.0–100.0)

## 2016-03-18 ENCOUNTER — Ambulatory Visit (INDEPENDENT_AMBULATORY_CARE_PROVIDER_SITE_OTHER): Payer: Medicare Other | Admitting: Family Medicine

## 2016-03-18 ENCOUNTER — Encounter: Payer: Self-pay | Admitting: Family Medicine

## 2016-03-18 ENCOUNTER — Other Ambulatory Visit: Payer: Self-pay | Admitting: Family Medicine

## 2016-03-18 VITALS — BP 145/62 | HR 56 | Temp 97.1°F | Ht 61.0 in | Wt 169.0 lb

## 2016-03-18 DIAGNOSIS — F411 Generalized anxiety disorder: Secondary | ICD-10-CM | POA: Diagnosis not present

## 2016-03-18 DIAGNOSIS — K219 Gastro-esophageal reflux disease without esophagitis: Secondary | ICD-10-CM

## 2016-03-18 DIAGNOSIS — E559 Vitamin D deficiency, unspecified: Secondary | ICD-10-CM

## 2016-03-18 DIAGNOSIS — I10 Essential (primary) hypertension: Secondary | ICD-10-CM | POA: Diagnosis not present

## 2016-03-18 DIAGNOSIS — I2510 Atherosclerotic heart disease of native coronary artery without angina pectoris: Secondary | ICD-10-CM | POA: Insufficient documentation

## 2016-03-18 DIAGNOSIS — I2583 Coronary atherosclerosis due to lipid rich plaque: Secondary | ICD-10-CM

## 2016-03-18 DIAGNOSIS — E785 Hyperlipidemia, unspecified: Secondary | ICD-10-CM | POA: Diagnosis not present

## 2016-03-18 MED ORDER — ATORVASTATIN CALCIUM 80 MG PO TABS: ORAL_TABLET | ORAL | Status: DC

## 2016-03-18 MED ORDER — LISINOPRIL 30 MG PO TABS: 30.0000 mg | ORAL_TABLET | Freq: Every day | ORAL | Status: DC

## 2016-03-18 NOTE — Telephone Encounter (Signed)
She was taking 80 mg of Lipitor , she should continue to take 80 mg of generic Lipitor which his atorvastatin

## 2016-03-18 NOTE — Progress Notes (Signed)
Subjective:    Patient ID: Misty Smith, female    DOB: 05/29/1941, 75 y.o.   MRN: [id number]  HPI Pt here for follow up and management of chronic medical problems which includes hypertension. She is taking medications regularly.The patient is doing well overall. She is concerned that her home blood pressure readings have been elevated on the systolic side in the 676-195 range. She brings these readings in for review and it appears that the morning readings are at her and the evening readings are more elevated. These readings will be scanned into the record. She has had lab work done and this will be reviewed with her during the visit today. Cholesterol numbers with advanced lipid testing were all excellent including the triglycerides. The total LDL particle number was 961. The hemoglobin A1c was 6.4% and this means that average blood sugar for the previous 3 months has been good in the prediabetic range and that she should continue with her current treatment. The blood sugar was elevated at 114. The creatinine, the most important kidney function test was within normal limits. The CBC was within normal limits with a good hemoglobin at 12.5. All thyroid function tests were within normal limits and the vitamin D was good at 48.2 with normal liver function tests.The patient denies any chest pain or shortness of breath anymore than usual. She sees the cardiologist on a yearly basis and last saw him several months ago and he told her that if the systolic blood pressure remained somewhat elevated that she might consider increasing her lisinopril. All other nodes were good from that visit. She has occasional indigestion. She still takes her proton pump inhibitor for this. She denies any nausea vomiting diarrhea blood in the stool or black tarry bowel movements and is passing her water without problems. She will come back sometime in the next 10 days or so to have her potassium repeated.     Patient Active  Problem List   Diagnosis Date Noted  . 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 CAD (coronary artery disease) 12/30/2013  . 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 Osteopenia of the elderly 10/26/2013  . Generalized anxiety disorder 09/21/2013  . GERD (gastroesophageal reflux disease) 09/21/2013  . Pulmonary nodule seen on imaging study 09/21/2013  . SYNCOPE 02/02/2009  . Pre-diabetes 02/01/2009  . Hyperlipidemia 02/01/2009  . Essential hypertension 02/01/2009  . DIZZINESS 02/01/2009  . PALPITATIONS 02/01/2009   Outpatient Encounter Prescriptions as of 03/18/2016  Medication Sig  . ALPRAZolam (XANAX) 0.25 MG tablet Take 0.25 mg by mouth at bedtime as needed for anxiety.   Marland Kitchen aspirin EC 81 MG tablet Take 1 tablet (81 mg total) by mouth daily.  Marland Kitchen atorvastatin (LIPITOR) 80 MG tablet TAKE AS DIRECTED  . Calcium Carb-Cholecalciferol (CALTRATE 600+D) 600-800 MG-UNIT TABS Take 1 tablet by mouth every evening.   . cholecalciferol (VITAMIN D) 1000 UNITS tablet Take 2,000 Units by mouth daily.  . clobetasol (TEMOVATE) 0.05 % external solution Apply 1 application topically 2 (two) times daily.  Marland Kitchen desonide (DESOWEN) 0.05 % cream Apply 1 application topically 2 (two) times daily as needed (for rash).   Marland Kitchen escitalopram (LEXAPRO) 20 MG tablet Take 1 tablet (20 mg total) by mouth daily.  Marland Kitchen esomeprazole (NEXIUM) 40 MG capsule TAKE 1 CAPSULE BY MOUTH ONCE A DAY  . estradiol (ESTRACE VAGINAL) 0.1 MG/GM vaginal cream Place 2 g vaginally See admin instructions. Place 2 g vaginally 2-3 times weekly.  . Halcinonide (HALOG) 0.1 % CREA Apply  1 application topically 2 (two) times daily.  Marland Kitchen lisinopril (PRINIVIL,ZESTRIL) 20 MG tablet TAKE 1 TABLET (20 MG TOTAL) BY MOUTH DAILY.  . metoprolol tartrate (LOPRESSOR) 25 MG tablet TAKE ONE TABLET ('25MG'$ ) BY MOUTH IN THE AM AND TWO TABLETS ('50MG'$ ) BY MOUTH IN THE PM. (Patient taking differently: TAKE ONE TABLET ('25MG'$ ) BY MOUTH IN THE AM ANd one TABLET ('25MG'$ ) BY MOUTH IN THE PM.)   . Multiple Vitamin (MULTIVITAMIN) tablet Take 1 tablet by mouth every morning.   Marland Kitchen omega-3 acid ethyl esters (LOVAZA) 1 g capsule TAKE 2 CAPSULES BY MOUTH TWICE A DAY  . pioglitazone (ACTOS) 30 MG tablet TAKE 1 TABLET (30 MG TOTAL) BY MOUTH DAILY.  . [DISCONTINUED] atorvastatin (LIPITOR) 80 MG tablet TAKE AS DIRECTED  . [DISCONTINUED] levofloxacin (LEVAQUIN) 250 MG tablet Take 2 tablets (500 mg total) by mouth daily. For 10 days  . [DISCONTINUED] metoprolol tartrate (LOPRESSOR) 25 MG tablet 50 mg BID - per pt   No facility-administered encounter medications on file as of 03/18/2016.      Review of Systems  Constitutional: Negative.   HENT: Negative.   Eyes: Negative.   Respiratory: Negative.   Cardiovascular: Negative.        Systolic BP elevated (272-536)  Gastrointestinal: Negative.   Endocrine: Negative.   Genitourinary: Negative.   Musculoskeletal: Negative.   Skin: Negative.   Allergic/Immunologic: Negative.   Neurological: Negative.   Hematological: Negative.   Psychiatric/Behavioral: Negative.        Objective:   Physical Exam  Constitutional: She is oriented to person, place, and time. She appears well-developed and well-nourished. No distress.  HENT:  Head: Normocephalic and atraumatic.  Right Ear: External ear normal.  Left Ear: External ear normal.  Nose: Nose normal.  Mouth/Throat: Oropharynx is clear and moist.  Eyes: Conjunctivae and EOM are normal. Pupils are equal, round, and reactive to light. Right eye exhibits no discharge. Left eye exhibits no discharge. No scleral icterus.  Neck: Normal range of motion. Neck supple. No JVD present. No thyromegaly present.  Cardiovascular: Normal rate, regular rhythm, normal heart sounds and intact distal pulses.   No murmur heard. Heart has a regular rate and rhythm at 60/m  Pulmonary/Chest: Effort normal and breath sounds normal. No respiratory distress. She has no wheezes. She has no rales. She exhibits no  tenderness.  Clear anteriorly and posteriorly  Abdominal: Soft. Bowel sounds are normal. She exhibits no mass. There is no tenderness. There is no rebound and no guarding.  There was slight right upper quadrant tenderness and epigastric tenderness. There is no masses liver or spleen enlargement or bruits  Musculoskeletal: Normal range of motion. She exhibits no edema.  Lymphadenopathy:    She has no cervical adenopathy.  Neurological: She is alert and oriented to person, place, and time. She has normal reflexes. No cranial nerve deficit.  Skin: Skin is warm and dry. No rash noted.  Psychiatric: She has a normal mood and affect. Her behavior is normal. Judgment and thought content normal.  Nursing note and vitals reviewed.   BP 145/62 mmHg  Pulse 56  Temp(Src) 97.1 F (36.2 C) (Oral)  Ht '5\' 1"'$  (1.549 m)  Wt 169 lb (76.658 kg)  BMI 31.95 kg/m2       Assessment & Plan:  1. Essential hypertension -The patient's home blood pressures have been running high in the afternoon on the systolic side. The cardiologist also found this to be the case. We will increase her lisinopril to 30 mg  daily and new prescription will be called in for this. She will come by the office in 10 days or so to repeat the potassium and bring blood pressure readings from home and we will check the blood pressure here in the office at that time. - BMP8+EGFR; Future  2. Gastroesophageal reflux disease, esophagitis presence not specified -She is doing well with this and continues to take her proton pump inhibitor  3. Hyperlipemia -She will continue with aggressive therapeutic lifestyle changes and her atorvastatin.  4. Vitamin D deficiency -Continue with vitamin D replacement  5. Anxiety state -She does a lot of stress at home and taking care of her husband and does quite well with this. She has a Xanax that she takes periodically as needed for this.  6. Coronary artery disease due to lipid rich plaque -Continue  to follow-up with cardiology on a yearly basis  Patient Instructions                       Medicare Annual Wellness Visit  [facility] and the medical providers at [facility] strive to bring you the best medical care.  In doing so we not only want to address your current medical conditions and concerns but also to detect new conditions early and prevent illness, disease and health-related problems.    Medicare offers a yearly Wellness Visit which allows our clinical staff to assess your need for preventative services including immunizations, lifestyle education, counseling to decrease risk of preventable diseases and screening for fall risk and other medical concerns.    This visit is provided free of charge (no copay) for all Medicare recipients. The clinical pharmacists at [facility] have begun to conduct these Wellness Visits which will also include a thorough review of all your medications.    As you primary medical provider recommend that you make an appointment for your Annual Wellness Visit if you have not done so already this year.  You may set up this appointment before you leave today or you may call back [phone]) and schedule an appointment.  Please make sure when you call that you mention that you are scheduling your Annual Wellness Visit with the clinical pharmacist so that the appointment may be made for the proper length of time.   Continue current medications. Continue good therapeutic lifestyle changes which include good diet and exercise. Fall precautions discussed with patient. If an FOBT was given today- please return it to our front desk. If you are over 47 years old - you may need Prevnar 75 or the adult Pneumonia vaccine.   After your visit with [place] today you will receive a survey in the mail or online from [employer] regarding your care with [place]. Please take a moment to fill this out. Your feedback is very important to [place] as  you can help [place] better understand your patient needs as well as improve your experience and satisfaction. WE CARE ABOUT YOU!!!   Monitor BP - keep readings Stay as active as possible  Come by in 10 days for a BMP (kidney lab) in the lab We have sent in Lisinopril 30 mg - one a day    Arrie Senate MD

## 2016-03-18 NOTE — Telephone Encounter (Signed)
Pt is aware.  

## 2016-03-18 NOTE — Patient Instructions (Addendum)
Medicare Annual Wellness Visit  [facility] and the medical providers at [facility] strive to bring you the best medical care.  In doing so we not only want to address your current medical conditions and concerns but also to detect new conditions early and prevent illness, disease and health-related problems.    Medicare offers a yearly Wellness Visit which allows our clinical staff to assess your need for preventative services including immunizations, lifestyle education, counseling to decrease risk of preventable diseases and screening for fall risk and other medical concerns.    This visit is provided free of charge (no copay) for all Medicare recipients. The clinical pharmacists at [facility] have begun to conduct these Wellness Visits which will also include a thorough review of all your medications.    As you primary medical provider recommend that you make an appointment for your Annual Wellness Visit if you have not done so already this year.  You may set up this appointment before you leave today or you may call back [phone] and schedule an appointment.  Please make sure when you call that you mention that you are scheduling your Annual Wellness Visit with the clinical pharmacist so that the appointment may be made for the proper length of time.   Continue current medications. Continue good therapeutic lifestyle changes which include good diet and exercise. Fall precautions discussed with patient. If an FOBT was given today- please return it to our front desk. If you are over 33 years old - you may need Prevnar 42 or the adult Pneumonia vaccine.   After your visit with [place] today you will receive a survey in the mail or online from [employer] regarding your care with [place]. Please take a moment to fill this out. Your feedback is very important to [place] as you can help [place] better understand your patient needs as well as improve  your experience and satisfaction. WE CARE ABOUT YOU!!!   Monitor BP - keep readings Stay as active as possible  Come by in 10 days for a BMP (kidney lab) in the lab We have sent in Lisinopril 30 mg - one a day

## 2016-03-19 ENCOUNTER — Other Ambulatory Visit: Payer: Self-pay | Admitting: *Deleted

## 2016-03-19 ENCOUNTER — Ambulatory Visit: Payer: Medicare Other | Admitting: Family Medicine

## 2016-03-26 ENCOUNTER — Other Ambulatory Visit: Payer: Self-pay | Admitting: Family Medicine

## 2016-03-28 DIAGNOSIS — M7061 Trochanteric bursitis, right hip: Secondary | ICD-10-CM | POA: Diagnosis not present

## 2016-03-31 ENCOUNTER — Ambulatory Visit (INDEPENDENT_AMBULATORY_CARE_PROVIDER_SITE_OTHER): Payer: Medicare Other | Admitting: *Deleted

## 2016-03-31 DIAGNOSIS — I10 Essential (primary) hypertension: Secondary | ICD-10-CM

## 2016-03-31 LAB — BMP8+EGFR
BUN/Creatinine Ratio: 22 (ref 12–28)
BUN: 16 mg/dL (ref 8–27)
CALCIUM: 9.9 mg/dL (ref 8.7–10.3)
CHLORIDE: 96 mmol/L (ref 96–106)
CO2: 28 mmol/L (ref 18–29)
Creatinine, Ser: 0.73 mg/dL (ref 0.57–1.00)
GFR, EST AFRICAN AMERICAN: 92 mL/min/{1.73_m2} (ref >59)
GFR, EST NON AFRICAN AMERICAN: 80 mL/min/{1.73_m2} (ref >59)
Glucose: 94 mg/dL (ref 65–99)
POTASSIUM: 4.4 mmol/L (ref 3.5–5.2)
SODIUM: 135 mmol/L (ref 134–144)

## 2016-03-31 NOTE — Progress Notes (Signed)
Continue to monitor blood pressures at home and watch sodium intake and stay as active physically and keep weight is low as possible

## 2016-03-31 NOTE — Patient Instructions (Signed)
Hypertension Hypertension, commonly called high blood pressure, is when the force of blood pumping through your arteries is too strong. Your arteries are the blood vessels that carry blood from your heart throughout your body. A blood pressure reading consists of a higher number over a lower number, such as 110/72. The higher number (systolic) is the pressure inside your arteries when your heart pumps. The lower number (diastolic) is the pressure inside your arteries when your heart relaxes. Ideally you want your blood pressure below 120/80. Hypertension forces your heart to work harder to pump blood. Your arteries may become narrow or stiff. Having untreated or uncontrolled hypertension can cause heart attack, stroke, kidney disease, and other problems. RISK FACTORS Some risk factors for high blood pressure are controllable. Others are not.  Risk factors you cannot control include:   Race. You may be at higher risk if you are African American.  Age. Risk increases with age.  Gender. Men are at higher risk than women before age 45 years. After age 65, women are at higher risk than men. Risk factors you can control include:  Not getting enough exercise or physical activity.  Being overweight.  Getting too much fat, sugar, calories, or salt in your diet.  Drinking too much alcohol. SIGNS AND SYMPTOMS Hypertension does not usually cause signs or symptoms. Extremely high blood pressure (hypertensive crisis) may cause headache, anxiety, shortness of breath, and nosebleed. DIAGNOSIS To check if you have hypertension, your health care provider will measure your blood pressure while you are seated, with your arm held at the level of your heart. It should be measured at least twice using the same arm. Certain conditions can cause a difference in blood pressure between your right and left arms. A blood pressure reading that is higher than normal on one occasion does not mean that you need treatment. If  it is not clear whether you have high blood pressure, you may be asked to return on a different day to have your blood pressure checked again. Or, you may be asked to monitor your blood pressure at home for 1 or more weeks. TREATMENT Treating high blood pressure includes making lifestyle changes and possibly taking medicine. Living a healthy lifestyle can help lower high blood pressure. You may need to change some of your habits. Lifestyle changes may include:  Following the DASH diet. This diet is high in fruits, vegetables, and whole grains. It is low in salt, red meat, and added sugars.  Keep your sodium intake below 2,300 mg per day.  Getting at least 30-45 minutes of aerobic exercise at least 4 times per week.  Losing weight if necessary.  Not smoking.  Limiting alcoholic beverages.  Learning ways to reduce stress. Your health care provider may prescribe medicine if lifestyle changes are not enough to get your blood pressure under control, and if one of the following is true:  You are 18-59 years of age and your systolic blood pressure is above 140.  You are 60 years of age or older, and your systolic blood pressure is above 150.  Your diastolic blood pressure is above 90.  You have diabetes, and your systolic blood pressure is over 140 or your diastolic blood pressure is over 90.  You have kidney disease and your blood pressure is above 140/90.  You have heart disease and your blood pressure is above 140/90. Your personal target blood pressure may vary depending on your medical conditions, your age, and other factors. HOME CARE INSTRUCTIONS    Have your blood pressure rechecked as directed by your health care provider.   Take medicines only as directed by your health care provider. Follow the directions carefully. Blood pressure medicines must be taken as prescribed. The medicine does not work as well when you skip doses. Skipping doses also puts you at risk for  problems.  Do not smoke.   Monitor your blood pressure at home as directed by your health care provider. SEEK MEDICAL CARE IF:   You think you are having a reaction to medicines taken.  You have recurrent headaches or feel dizzy.  You have swelling in your ankles.  You have trouble with your vision. SEEK IMMEDIATE MEDICAL CARE IF:  You develop a severe headache or confusion.  You have unusual weakness, numbness, or feel faint.  You have severe chest or abdominal pain.  You vomit repeatedly.  You have trouble breathing. MAKE SURE YOU:   Understand these instructions.  Will watch your condition.  Will get help right away if you are not doing well or get worse.   This information is not intended to replace advice given to you by your health care provider. Make sure you discuss any questions you have with your health care provider.   Document Released: 08/18/2005 Document Revised: 01/02/2015 Document Reviewed: 06/10/2013 Elsevier Interactive Patient Education 2016 [employer]  

## 2016-03-31 NOTE — Progress Notes (Signed)
Patient is here today for a BP rck. Patients BP 139/67 (BP Location: Right Arm, Patient Position: Sitting, Cuff Size: Large)   Pulse (!) 44 . Patient advised we would call her if any changes needed to be made.

## 2016-04-08 DIAGNOSIS — M5430 Sciatica, unspecified side: Secondary | ICD-10-CM | POA: Diagnosis not present

## 2016-04-08 DIAGNOSIS — M9904 Segmental and somatic dysfunction of sacral region: Secondary | ICD-10-CM | POA: Diagnosis not present

## 2016-04-08 DIAGNOSIS — M9902 Segmental and somatic dysfunction of thoracic region: Secondary | ICD-10-CM | POA: Diagnosis not present

## 2016-04-08 DIAGNOSIS — M9903 Segmental and somatic dysfunction of lumbar region: Secondary | ICD-10-CM | POA: Diagnosis not present

## 2016-04-09 DIAGNOSIS — M25562 Pain in left knee: Secondary | ICD-10-CM | POA: Diagnosis not present

## 2016-04-09 DIAGNOSIS — M5430 Sciatica, unspecified side: Secondary | ICD-10-CM | POA: Diagnosis not present

## 2016-04-09 DIAGNOSIS — M9903 Segmental and somatic dysfunction of lumbar region: Secondary | ICD-10-CM | POA: Diagnosis not present

## 2016-04-09 DIAGNOSIS — M9904 Segmental and somatic dysfunction of sacral region: Secondary | ICD-10-CM | POA: Diagnosis not present

## 2016-04-09 DIAGNOSIS — M9902 Segmental and somatic dysfunction of thoracic region: Secondary | ICD-10-CM | POA: Diagnosis not present

## 2016-04-10 DIAGNOSIS — M9904 Segmental and somatic dysfunction of sacral region: Secondary | ICD-10-CM | POA: Diagnosis not present

## 2016-04-10 DIAGNOSIS — M9903 Segmental and somatic dysfunction of lumbar region: Secondary | ICD-10-CM | POA: Diagnosis not present

## 2016-04-10 DIAGNOSIS — M25562 Pain in left knee: Secondary | ICD-10-CM | POA: Diagnosis not present

## 2016-04-10 DIAGNOSIS — M5430 Sciatica, unspecified side: Secondary | ICD-10-CM | POA: Diagnosis not present

## 2016-04-10 DIAGNOSIS — M9902 Segmental and somatic dysfunction of thoracic region: Secondary | ICD-10-CM | POA: Diagnosis not present

## 2016-04-14 DIAGNOSIS — M9902 Segmental and somatic dysfunction of thoracic region: Secondary | ICD-10-CM | POA: Diagnosis not present

## 2016-04-14 DIAGNOSIS — M9903 Segmental and somatic dysfunction of lumbar region: Secondary | ICD-10-CM | POA: Diagnosis not present

## 2016-04-14 DIAGNOSIS — M25562 Pain in left knee: Secondary | ICD-10-CM | POA: Diagnosis not present

## 2016-04-14 DIAGNOSIS — M9904 Segmental and somatic dysfunction of sacral region: Secondary | ICD-10-CM | POA: Diagnosis not present

## 2016-04-14 DIAGNOSIS — M5430 Sciatica, unspecified side: Secondary | ICD-10-CM | POA: Diagnosis not present

## 2016-04-17 DIAGNOSIS — M9903 Segmental and somatic dysfunction of lumbar region: Secondary | ICD-10-CM | POA: Diagnosis not present

## 2016-04-17 DIAGNOSIS — M9904 Segmental and somatic dysfunction of sacral region: Secondary | ICD-10-CM | POA: Diagnosis not present

## 2016-04-17 DIAGNOSIS — M9902 Segmental and somatic dysfunction of thoracic region: Secondary | ICD-10-CM | POA: Diagnosis not present

## 2016-04-17 DIAGNOSIS — M25562 Pain in left knee: Secondary | ICD-10-CM | POA: Diagnosis not present

## 2016-04-17 DIAGNOSIS — M5430 Sciatica, unspecified side: Secondary | ICD-10-CM | POA: Diagnosis not present

## 2016-04-21 DIAGNOSIS — M9902 Segmental and somatic dysfunction of thoracic region: Secondary | ICD-10-CM | POA: Diagnosis not present

## 2016-04-21 DIAGNOSIS — M9903 Segmental and somatic dysfunction of lumbar region: Secondary | ICD-10-CM | POA: Diagnosis not present

## 2016-04-21 DIAGNOSIS — M25562 Pain in left knee: Secondary | ICD-10-CM | POA: Diagnosis not present

## 2016-04-21 DIAGNOSIS — M9904 Segmental and somatic dysfunction of sacral region: Secondary | ICD-10-CM | POA: Diagnosis not present

## 2016-04-21 DIAGNOSIS — M5430 Sciatica, unspecified side: Secondary | ICD-10-CM | POA: Diagnosis not present

## 2016-04-22 ENCOUNTER — Encounter: Payer: Self-pay | Admitting: Pharmacist

## 2016-04-22 ENCOUNTER — Ambulatory Visit (INDEPENDENT_AMBULATORY_CARE_PROVIDER_SITE_OTHER): Payer: Medicare Other | Admitting: Pharmacist

## 2016-04-22 VITALS — BP 136/84 | HR 60 | Ht 60.5 in | Wt 168.0 lb

## 2016-04-22 DIAGNOSIS — Z0000 Encounter for general adult medical examination without abnormal findings: Secondary | ICD-10-CM

## 2016-04-22 DIAGNOSIS — E669 Obesity, unspecified: Secondary | ICD-10-CM | POA: Diagnosis not present

## 2016-04-22 DIAGNOSIS — Z79899 Other long term (current) drug therapy: Secondary | ICD-10-CM

## 2016-04-22 DIAGNOSIS — M8580 Other specified disorders of bone density and structure, unspecified site: Secondary | ICD-10-CM

## 2016-04-22 MED ORDER — ESCITALOPRAM OXALATE 20 MG PO TABS: 10.0000 mg | ORAL_TABLET | Freq: Every day | ORAL | 1 refills | Status: DC

## 2016-04-22 MED ORDER — ATORVASTATIN CALCIUM 80 MG PO TABS: 40.0000 mg | ORAL_TABLET | Freq: Every day | ORAL | 1 refills | Status: DC

## 2016-04-22 MED ORDER — METOPROLOL TARTRATE 25 MG PO TABS: 25.0000 mg | ORAL_TABLET | Freq: Two times a day (BID) | ORAL | 1 refills | Status: DC

## 2016-04-22 NOTE — Progress Notes (Signed)
Patient ID: MARCINA RISCH, female   DOB: 08/27/1941, 75 y.o.   MRN: [id number]    Subjective:   OLEVIA BUDMAN is a 75 y.o. female who presents for a subsequent Medicare Annual Wellness Visit.  Mrs. Melley is married and lives with her husband Milbert Coulter.  He has dementia and over the last year several other health problems.  She is his primary care giver and some days feels stressed - but walking helps relieve stress.  Mrs. Ancheta is retired.  Use to clean church for 18 years.  She has 4 grown children.  Review of Systems  Review of Systems  Constitutional: Negative.   HENT: Negative.   Eyes: Negative.   Respiratory: Negative.   Cardiovascular: Negative.   Gastrointestinal: Positive for heartburn (has improved a little since decreased lovaza / omega 3 dose to 2 per day instead of 4 per day).  Genitourinary: Negative.   Musculoskeletal: Negative.   Skin: Negative.   Neurological: Negative for dizziness.  Endo/Heme/Allergies: Negative.   Psychiatric/Behavioral: Positive for depression. The patient is nervous/anxious.      Current Medications (verified) Outpatient Encounter Prescriptions as of 04/22/2016  Medication Sig  . aspirin EC 81 MG tablet Take 1 tablet (81 mg total) by mouth daily.  Marland Kitchen atorvastatin (LIPITOR) 80 MG tablet Take 0.5 tablets (40 mg total) by mouth daily. TAKE AS DIRECTED  . Calcium Carb-Cholecalciferol (CALTRATE 600+D) 600-800 MG-UNIT TABS Take 1 tablet by mouth every evening.   . cholecalciferol (VITAMIN D) 1000 UNITS tablet Take 2,000 Units by mouth daily.  . clobetasol (TEMOVATE) 0.05 % external solution Apply 1 application topically 2 (two) times daily.  Marland Kitchen desonide (DESOWEN) 0.05 % cream Apply 1 application topically 2 (two) times daily as needed (for rash).   Marland Kitchen escitalopram (LEXAPRO) 20 MG tablet Take 0.5 tablets (10 mg total) by mouth daily.  Marland Kitchen esomeprazole (NEXIUM) 40 MG capsule TAKE 1 CAPSULE BY MOUTH ONCE A DAY  . estradiol (ESTRACE VAGINAL) 0.1 MG/GM vaginal  cream Place 2 g vaginally See admin instructions. Place 2 g vaginally 2-3 times weekly.  . Halcinonide (HALOG) 0.1 % CREA Apply 1 application topically 2 (two) times daily.  Marland Kitchen lisinopril (PRINIVIL,ZESTRIL) 30 MG tablet Take 1 tablet (30 mg total) by mouth daily.  . metoprolol tartrate (LOPRESSOR) 25 MG tablet Take 1 tablet (25 mg total) by mouth 2 (two) times daily.  . Multiple Vitamin (MULTIVITAMIN) tablet Take 1 tablet by mouth every morning.   Marland Kitchen omega-3 acid ethyl esters (LOVAZA) 1 g capsule TAKE 2 CAPSULES BY MOUTH TWICE A DAY (Patient taking differently: Take 1 capsule twice a day)  . pioglitazone (ACTOS) 30 MG tablet TAKE 1 TABLET (30 MG TOTAL) BY MOUTH DAILY.  . [DISCONTINUED] atorvastatin (LIPITOR) 80 MG tablet TAKE AS DIRECTED (Patient taking differently: Take 40 mg by mouth daily. TAKE AS DIRECTED)  . [DISCONTINUED] escitalopram (LEXAPRO) 20 MG tablet Take 1 tablet (20 mg total) by mouth daily. (Patient taking differently: Take 10 mg by mouth daily. )  . [DISCONTINUED] metoprolol tartrate (LOPRESSOR) 25 MG tablet TAKE ONE TABLET (25MG ) BY MOUTH IN THE AM AND TWO TABLETS (50MG ) BY MOUTH IN THE PM. (Patient taking differently: TAKE ONE TABLET (25MG ) BY MOUTH IN THE AM ANd one TABLET (25MG ) BY MOUTH IN THE PM.)  . [DISCONTINUED] metoprolol tartrate (LOPRESSOR) 25 MG tablet Take 1 tablet (25 mg total) by mouth 2 (two) times daily. TAKE ONE TABLET (25MG ) BY MOUTH IN THE AM ANd one TABLET (25MG ) BY MOUTH IN  THE PM.  . ALPRAZolam (XANAX) 0.25 MG tablet Take 0.25 mg by mouth at bedtime as needed for anxiety.   . [DISCONTINUED] atorvastatin (LIPITOR) 80 MG tablet TAKE AS DIRECTED   No facility-administered encounter medications on file as of 04/22/2016.     Allergies (verified) Ciprofloxacin; Codeine; Iohexol; Penicillins; Sulfonamide derivatives; Asa [aspirin]; Keflex [cephalexin]; and Morphine and related   History: Past Medical History:  Diagnosis Date  . Anxiety   . Arthritis   .  Bronchitis   . Cataract   . Colovaginal fistula   . Depression   . Diabetes mellitus without complication ([place])   . Dysrhythmia    palpitations, occasional PVCs; Dr Stanford Breed cardiologist  . Esophageal stricture   . Fractured elbow 1970's  . GERD (gastroesophageal reflux disease)   . H/O hiatal hernia   . Hyperlipidemia   . Hypertension   . Perimenopausal vasomotor symptoms   . Postmenopausal HRT (hormone replacement therapy)   . Pre-diabetes   . Stress   . Subacute lupus erythematosus    Dr. Sol Blazing     Past Surgical History:  Procedure Laterality Date  . abdominal bypass  1987   fibroids   . ABDOMINAL HYSTERECTOMY     partial  . bilateral tubal ligtation  1972  . CHOLECYSTECTOMY N/A 03/21/2014   Procedure: LAPAROSCOPIC CHOLECYSTECTOMY WITH INTRAOPERATIVE CHOLANGIOGRAM;  Surgeon: Shann Medal, MD;  Location: [facility];  Service: General;  Laterality: N/A;  . COLONOSCOPY    . elbow Right 1976   surgery after fracture of elbow  . TONSILLECTOMY    . TUBAL LIGATION     Family History  Problem Relation Age of Onset  . Cancer Mother     laryngeal   . Hypertension Mother   . Heart disease Mother   . Hypertension Father   . Coronary artery disease Father   . Kidney disease Father   . Breast cancer Sister   . Hypertension Sister   . Cancer Sister     breast  . Heart attack Sister   . Heart disease Sister   . Stroke Sister   . Stroke Sister   . Hypertension Brother   . Cancer Brother     liver  . Hypertension Sister   . Hyperlipidemia Sister   . Hypertension Brother    Social History   Occupational History  . Not on file.   Social History Main Topics  . Smoking status: Never Smoker  . Smokeless tobacco: Never Used  . Alcohol use No  . Drug use: No  . Sexual activity: No    Do you feel safe at home?  Yes Are there smokers in your home (other than you)? No  Dietary issues and exercise activities: Current Exercise Habits: The patient does not participate in  regular exercise at present (she walks during fall and spring when cooler outside)  Current Dietary habits: she tries to avoid sugar and limits bread and potatoes due to pre diabetes.  She is trying to limit serving sizes and eat smaller meals.   Objective:    Today's Vitals   04/22/16 0911  BP: 136/84  Pulse: 60  Weight: 168 lb (76.2 kg)  Height: 5' 0.5" (1.537 m)  PainSc: 0-No pain   Body mass index is 32.27 kg/m.   Max adult height = 5\' 1"   Weight at last years AWV = 177 lbs  Activities of Daily Living In your present state of health, do you have any difficulty performing the following activities:  04/22/2016  Hearing? N  Vision? N  Difficulty concentrating or making decisions? N  Walking or climbing stairs? N  Dressing or bathing? N  Doing errands, shopping? N  Preparing Food and eating ? N  Using the Toilet? N  In the past six months, have you accidently leaked urine? N  Do you have problems with loss of bowel control? N  Managing your Medications? N  Managing your Finances? N  Housekeeping or managing your Housekeeping? N  Some recent data might be hidden     Cardiac Risk Factors include: advanced age (>43men, >47 women);dyslipidemia;family history of premature cardiovascular disease;hypertension;obesity (BMI >30kg/m2);sedentary lifestyle  Depression Screen PHQ 2/9 Scores 04/22/2016 03/18/2016 12/10/2015 11/09/2015  PHQ - 2 Score 1 0 0 0     Fall Risk Fall Risk  04/22/2016 03/18/2016 12/10/2015 11/09/2015 11/09/2015  Falls in the past year? No No Yes Yes No  Number falls in past yr: - - 1 1 -  Injury with Fall? - - No No -  Risk for fall due to : - - - - -    Cognitive Function: MMSE - Mini Mental State Exam 04/22/2016  Orientation to time 5  Orientation to Place 5  Registration 3  Attention/ Calculation 5  Recall 3  Language- name 2 objects 2  Language- repeat 1  Language- follow 3 step command 3  Language- read & follow direction 1  Write a sentence 1    Copy design 1  Total score 30    Immunizations and Health Maintenance Immunization History  Administered Date(s) Administered  . Influenza, High Dose Seasonal PF 06/22/2015  . Influenza,inj,Quad PF,36+ Mos 06/08/2013, 06/14/2014  . Pneumococcal Conjugate-13 09/21/2013   Health Maintenance Due  Topic Date Due  . DEXA SCAN  10/03/2015  . INFLUENZA VACCINE  04/01/2016    Patient Care Team: Chipper Herb, MD as PCP - General (Family Medicine) Clarene Essex, MD (Gastroenterology) Lelon Perla, MD (Cardiology) Irine Seal, MD (Urology) Paralee Cancel, MD (Orthopedic Surgery) Clent Jacks, MD as Consulting Physician (Ophthalmology)  Indicate any recent Medical Services you may have received from other than [facility] providers in the past year (date may be approximate).    Assessment:    Annual Wellness Visit  Obesity - weight is trending down Pre diabetes Osteopenia - has been off Actonel for 2 to 3 years - due to recheck DEXA Medication management   Screening Tests Health Maintenance  Topic Date Due  . DEXA SCAN  10/03/2015  . INFLUENZA VACCINE  04/01/2016  . PNA vac Low Risk Adult (2 of 2 - PPSV23) 10/19/2016 (Originally 09/21/2014)  . PAP SMEAR  11/13/2016  . MAMMOGRAM  02/24/2017  . TETANUS/TDAP  06/01/2021  . ZOSTAVAX  Completed        Plan:   During the course of the visit Earnesteen was educated and counseled about the following appropriate screening and preventive services:   Vaccines to include Pneumoccal, Influenza, Td, Zostavax - vaccines are UTd  Colorectal cancer screening - colonoscopy is UTD; FOBT is UTD  Cardiovascular disease screening - EKG last 12/2015  Diabetes screening - patient has pre diabetes.  She is following dietary recommendations and decreasing weight to help with BG control  Bone Denisty / Osteoporosis Screening - DEXA due - she is unable to wait for DEXA today because he husband is with her but order is placed for future DEXA  Mammogram  - UTD  Glaucoma screening / Diabetic Eye Exam  Nutrition counseling -  reviewed dietary recommendations for pre diabetes.  Patient is commended on weight loss.  Continue as this will help prevent type 2 DM  Advanced Directives - requested patient being in copy  Physical Activity - gave chair exercises that patient can do at home on hot days.  She will start walking again when cooler outside or go to [place] / [place] to walk.  Goal is 150  Minutes per week but start where she can at 5 to 10 minutes and increase as able.  Patient's med list was updated and new Rx's sent to pharmacy to reflect how she is taking (atorvatatin, escitalopram and metoprolol were all incorrect)    Patient Instructions (the written plan) were given to the patient.   Cherre Robins, PharmD   04/22/2016

## 2016-04-22 NOTE — Patient Instructions (Addendum)
Misty Smith , Thank you for taking time to come for your Medicare Wellness Visit. I appreciate your ongoing commitment to your health goals. Please review the following plan we discussed and let me know if I can assist you in the future.   These are the goals we discussed:  Your weight has decreased since last year from 177 lbs to 168 lbs  Continue to limit sugar intake and foods that are high in carbohydrates such as bread, potatoes, rice, pasta and corn.  Also limit processed food such as cakes, cookie and crackers  Increase non-starchy vegetables - carrots, green bean, squash, zucchini, tomatoes, onions, peppers, spinach and other green leafy vegetables, cabbage, lettuce, cucumbers, asparagus, okra (not fried), eggplant Increase fresh fruit but limit serving sizes 1/2 cup or about the size of tennis or baseball Limit red meat to no more than 1-2 times per week (serving size about the size of your palm) Choose whole grains and lean proteins - whole wheat bread, quinoa, whole grain rice (1/2 cup), fish, chicken, [place] Avoid sugar and calorie containing beverages - soda, sweet tea and juice.  Choose water or unsweetened tea instead.  Start back exercise - goal is 150 minutes per week but start where you can even if its 5 to 10 minutes a day and work up as you are able.  Walking is a great exercise.   This is a list of the screening recommended for you and due dates:  Health Maintenance  Topic Date Due  . Flu Shot  04/01/2016  . Pneumonia vaccines (2 of 2 - PPSV23) Completed  . Pap Smear  11/13/2016  . Mammogram  02/24/2017  . Tetanus Vaccine  06/01/2021  . DEXA scan (bone density measurement)  Completed  . Shingles Vaccine  Completed  *Topic was postponed. The date shown is not the original due date.    Health Maintenance, Female Adopting a healthy lifestyle and getting preventive care can go a long way to promote health and wellness. Talk with your health care provider about what  schedule of regular examinations is right for you. This is a good chance for you to check in with your provider about disease prevention and staying healthy. In between checkups, there are plenty of things you can do on your own. Experts have done a lot of research about which lifestyle changes and preventive measures are most likely to keep you healthy. Ask your health care provider for more information. WEIGHT AND DIET  Eat a healthy diet  Be sure to include plenty of vegetables, fruits, low-fat dairy products, and lean protein.  Do not eat a lot of foods high in solid fats, added sugars, or salt.  Get regular exercise. This is one of the most important things you can do for your health.  Most adults should exercise for at least 150 minutes each week. The exercise should increase your heart rate and make you sweat (moderate-intensity exercise).  Most adults should also do strengthening exercises at least twice a week. This is in addition to the moderate-intensity exercise.  Maintain a healthy weight  Body mass index (BMI) is a measurement that can be used to identify possible weight problems. It estimates body fat based on height and weight. Your health care provider can help determine your BMI and help you achieve or maintain a healthy weight.  For females 7 years of age and older:   A BMI below 18.5 is considered underweight.  A BMI of 18.5 to 24.9 is  normal.  A BMI of 25 to 29.9 is considered overweight.  A BMI of 30 and above is considered obese.  Watch levels of cholesterol and blood lipids  You should start having your blood tested for lipids and cholesterol at 77 years of age, then have this test every 5 years.  You may need to have your cholesterol levels checked more often if:  Your lipid or cholesterol levels are high.  You are older than 77 years of age.  You are at high risk for heart disease.  CANCER SCREENING   Lung Cancer  Lung cancer screening is  recommended for adults 72-36 years old who are at high risk for lung cancer because of a history of smoking.  A yearly low-dose CT scan of the lungs is recommended for people who:  Currently smoke.  Have quit within the past 15 years.  Have at least a 30-pack-year history of smoking. A pack year is smoking an average of one pack of cigarettes a day for 1 year.  Yearly screening should continue until it has been 15 years since you quit.  Yearly screening should stop if you develop a health problem that would prevent you from having lung cancer treatment.  Breast Cancer  Practice breast self-awareness. This means understanding how your breasts normally appear and feel.  It also means doing regular breast self-exams. Let your health care provider know about any changes, no matter how small.  If you are in your 20s or 30s, you should have a clinical breast exam (CBE) by a health care provider every 1-3 years as part of a regular health exam.  If you are 13 or older, have a CBE every year. Also consider having a breast X-ray (mammogram) every year.  If you have a family history of breast cancer, talk to your health care provider about genetic screening.  If you are at high risk for breast cancer, talk to your health care provider about having an MRI and a mammogram every year.  Breast cancer gene (BRCA) assessment is recommended for women who have family members with BRCA-related cancers. BRCA-related cancers include:  Breast.  Ovarian.  Tubal.  Peritoneal cancers.  Results of the assessment will determine the need for genetic counseling and BRCA1 and BRCA2 testing. Cervical Cancer Your health care provider may recommend that you be screened regularly for cancer of the pelvic organs (ovaries, uterus, and vagina). This screening involves a pelvic examination, including checking for microscopic changes to the surface of your cervix (Pap test). You may be encouraged to have this  screening done every 3 years, beginning at age 27.  For women ages 13-65, health care providers may recommend pelvic exams and Pap testing every 3 years, or they may recommend the Pap and pelvic exam, combined with testing for human papilloma virus (HPV), every 5 years. Some types of HPV increase your risk of cervical cancer. Testing for HPV may also be done on women of any age with unclear Pap test results.  Other health care providers may not recommend any screening for nonpregnant women who are considered low risk for pelvic cancer and who do not have symptoms. Ask your health care provider if a screening pelvic exam is right for you.  If you have had past treatment for cervical cancer or a condition that could lead to cancer, you need Pap tests and screening for cancer for at least 20 years after your treatment. If Pap tests have been discontinued, your risk factors (such  as having a new sexual partner) need to be reassessed to determine if screening should resume. Some women have medical problems that increase the chance of getting cervical cancer. In these cases, your health care provider may recommend more frequent screening and Pap tests. Colorectal Cancer  This type of cancer can be detected and often prevented.  Routine colorectal cancer screening usually begins at 77 years of age and continues through 77 years of age.  Your health care provider may recommend screening at an earlier age if you have risk factors for colon cancer.  Your health care provider may also recommend using home test kits to check for hidden blood in the stool.  A small camera at the end of a tube can be used to examine your colon directly (sigmoidoscopy or colonoscopy). This is done to check for the earliest forms of colorectal cancer.  Routine screening usually begins at age 56.  Direct examination of the colon should be repeated every 5-10 years through 77 years of age. However, you may need to be screened  more often if early forms of precancerous polyps or small growths are found. Skin Cancer  Check your skin from head to toe regularly.  Tell your health care provider about any new moles or changes in moles, especially if there is a change in a mole's shape or color.  Also tell your health care provider if you have a mole that is larger than the size of a pencil eraser.  Always use sunscreen. Apply sunscreen liberally and repeatedly throughout the day.  Protect yourself by wearing long sleeves, pants, a wide-brimmed hat, and sunglasses whenever you are outside. HEART DISEASE, DIABETES, AND HIGH BLOOD PRESSURE   High blood pressure causes heart disease and increases the risk of stroke. High blood pressure is more likely to develop in:  People who have blood pressure in the high end of the normal range (130-139/85-89 mm Hg).  People who are overweight or obese.  People who are African American.  If you are 48-55 years of age, have your blood pressure checked every 3-5 years. If you are 41 years of age or older, have your blood pressure checked every year. You should have your blood pressure measured twice--once when you are at a hospital or clinic, and once when you are not at a hospital or clinic. Record the average of the two measurements. To check your blood pressure when you are not at a hospital or clinic, you can use:  An automated blood pressure machine at a pharmacy.  A home blood pressure monitor.  If you are between 51 years and 76 years old, ask your health care provider if you should take aspirin to prevent strokes.  Have regular diabetes screenings. This involves taking a blood sample to check your fasting blood sugar level.  If you are at a normal weight and have a low risk for diabetes, have this test once every three years after 77 years of age.  If you are overweight and have a high risk for diabetes, consider being tested at a younger age or more often. PREVENTING  INFECTION  Hepatitis B  If you have a higher risk for hepatitis B, you should be screened for this virus. You are considered at high risk for hepatitis B if:  You were born in a country where hepatitis B is common. Ask your health care provider which countries are considered high risk.  Your parents were born in a high-risk country, and you have  not been immunized against hepatitis B (hepatitis B vaccine).  You have HIV or AIDS.  You use needles to inject street drugs.  You live with someone who has hepatitis B.  You have had sex with someone who has hepatitis B.  You get hemodialysis treatment.  You take certain medicines for conditions, including cancer, organ transplantation, and autoimmune conditions. Hepatitis C  Blood testing is recommended for:  Everyone born from [birth year] through 1967.  Anyone with known risk factors for hepatitis C. Sexually transmitted infections (STIs)  You should be screened for sexually transmitted infections (STIs) including gonorrhea and chlamydia if:  You are sexually active and are younger than 77 years of age.  You are older than 77 years of age and your health care provider tells you that you are at risk for this type of infection.  Your sexual activity has changed since you were last screened and you are at an increased risk for chlamydia or gonorrhea. Ask your health care provider if you are at risk.  If you do not have HIV, but are at risk, it may be recommended that you take a prescription medicine daily to prevent HIV infection. This is called pre-exposure prophylaxis (PrEP). You are considered at risk if:  You are sexually active and do not regularly use condoms or know the HIV status of your partner(s).  You take drugs by injection.  You are sexually active with a partner who has HIV. Talk with your health care provider about whether you are at high risk of being infected with HIV. If you choose to begin PrEP, you should first be  tested for HIV. You should then be tested every 3 months for as long as you are taking PrEP.  PREGNANCY   If you are premenopausal and you may become pregnant, ask your health care provider about preconception counseling.  If you may become pregnant, take 400 to 800 micrograms (mcg) of folic acid every day.  If you want to prevent pregnancy, talk to your health care provider about birth control (contraception). OSTEOPOROSIS AND MENOPAUSE   Osteoporosis is a disease in which the bones lose minerals and strength with aging. This can result in serious bone fractures. Your risk for osteoporosis can be identified using a bone density scan.  If you are 24 years of age or older, or if you are at risk for osteoporosis and fractures, ask your health care provider if you should be screened.  Ask your health care provider whether you should take a calcium or vitamin D supplement to lower your risk for osteoporosis.  Menopause may have certain physical symptoms and risks.  Hormone replacement therapy may reduce some of these symptoms and risks. Talk to your health care provider about whether hormone replacement therapy is right for you.  HOME CARE INSTRUCTIONS   Schedule regular health, dental, and eye exams.  Stay current with your immunizations.   Do not use any tobacco products including cigarettes, chewing tobacco, or electronic cigarettes.  If you are pregnant, do not drink alcohol.  If you are breastfeeding, limit how much and how often you drink alcohol.  Limit alcohol intake to no more than 1 drink per day for nonpregnant women. One drink equals 12 ounces of beer, 5 ounces of wine, or 1 ounces of hard liquor.  Do not use street drugs.  Do not share needles.  Ask your health care provider for help if you need support or information about quitting drugs.  Tell your health  care provider if you often feel depressed.  Tell your health care provider if you have ever been abused or  do not feel safe at home.   This information is not intended to replace advice given to you by your health care provider. Make sure you discuss any questions you have with your health care provider.   Document Released: 03/03/2011 Document Revised: 09/08/2014 Document Reviewed: 07/20/2013 Elsevier Interactive Patient Education [employer].

## 2016-04-24 DIAGNOSIS — M9904 Segmental and somatic dysfunction of sacral region: Secondary | ICD-10-CM | POA: Diagnosis not present

## 2016-04-24 DIAGNOSIS — M25562 Pain in left knee: Secondary | ICD-10-CM | POA: Diagnosis not present

## 2016-04-24 DIAGNOSIS — M9903 Segmental and somatic dysfunction of lumbar region: Secondary | ICD-10-CM | POA: Diagnosis not present

## 2016-04-24 DIAGNOSIS — M5430 Sciatica, unspecified side: Secondary | ICD-10-CM | POA: Diagnosis not present

## 2016-04-24 DIAGNOSIS — M9902 Segmental and somatic dysfunction of thoracic region: Secondary | ICD-10-CM | POA: Diagnosis not present

## 2016-04-27 ENCOUNTER — Other Ambulatory Visit: Payer: Self-pay | Admitting: Family Medicine

## 2016-04-28 DIAGNOSIS — M9904 Segmental and somatic dysfunction of sacral region: Secondary | ICD-10-CM | POA: Diagnosis not present

## 2016-04-28 DIAGNOSIS — M9903 Segmental and somatic dysfunction of lumbar region: Secondary | ICD-10-CM | POA: Diagnosis not present

## 2016-04-28 DIAGNOSIS — M5430 Sciatica, unspecified side: Secondary | ICD-10-CM | POA: Diagnosis not present

## 2016-04-28 DIAGNOSIS — M9902 Segmental and somatic dysfunction of thoracic region: Secondary | ICD-10-CM | POA: Diagnosis not present

## 2016-04-28 DIAGNOSIS — M25562 Pain in left knee: Secondary | ICD-10-CM | POA: Diagnosis not present

## 2016-05-06 DIAGNOSIS — M9902 Segmental and somatic dysfunction of thoracic region: Secondary | ICD-10-CM | POA: Diagnosis not present

## 2016-05-06 DIAGNOSIS — M9901 Segmental and somatic dysfunction of cervical region: Secondary | ICD-10-CM | POA: Diagnosis not present

## 2016-05-06 DIAGNOSIS — M25562 Pain in left knee: Secondary | ICD-10-CM | POA: Diagnosis not present

## 2016-05-06 DIAGNOSIS — M9903 Segmental and somatic dysfunction of lumbar region: Secondary | ICD-10-CM | POA: Diagnosis not present

## 2016-05-06 DIAGNOSIS — M9904 Segmental and somatic dysfunction of sacral region: Secondary | ICD-10-CM | POA: Diagnosis not present

## 2016-05-06 DIAGNOSIS — M9905 Segmental and somatic dysfunction of pelvic region: Secondary | ICD-10-CM | POA: Diagnosis not present

## 2016-05-06 DIAGNOSIS — M5030 Other cervical disc degeneration, unspecified cervical region: Secondary | ICD-10-CM | POA: Diagnosis not present

## 2016-05-20 DIAGNOSIS — M5030 Other cervical disc degeneration, unspecified cervical region: Secondary | ICD-10-CM | POA: Diagnosis not present

## 2016-05-20 DIAGNOSIS — M25562 Pain in left knee: Secondary | ICD-10-CM | POA: Diagnosis not present

## 2016-05-20 DIAGNOSIS — M9905 Segmental and somatic dysfunction of pelvic region: Secondary | ICD-10-CM | POA: Diagnosis not present

## 2016-05-20 DIAGNOSIS — M9904 Segmental and somatic dysfunction of sacral region: Secondary | ICD-10-CM | POA: Diagnosis not present

## 2016-05-20 DIAGNOSIS — M9901 Segmental and somatic dysfunction of cervical region: Secondary | ICD-10-CM | POA: Diagnosis not present

## 2016-05-20 DIAGNOSIS — M9903 Segmental and somatic dysfunction of lumbar region: Secondary | ICD-10-CM | POA: Diagnosis not present

## 2016-05-20 DIAGNOSIS — M9902 Segmental and somatic dysfunction of thoracic region: Secondary | ICD-10-CM | POA: Diagnosis not present

## 2016-05-26 ENCOUNTER — Ambulatory Visit (INDEPENDENT_AMBULATORY_CARE_PROVIDER_SITE_OTHER): Payer: Medicare Other

## 2016-05-26 DIAGNOSIS — Z23 Encounter for immunization: Secondary | ICD-10-CM

## 2016-06-04 DIAGNOSIS — M5430 Sciatica, unspecified side: Secondary | ICD-10-CM | POA: Diagnosis not present

## 2016-06-04 DIAGNOSIS — M9905 Segmental and somatic dysfunction of pelvic region: Secondary | ICD-10-CM | POA: Diagnosis not present

## 2016-06-04 DIAGNOSIS — M9904 Segmental and somatic dysfunction of sacral region: Secondary | ICD-10-CM | POA: Diagnosis not present

## 2016-06-04 DIAGNOSIS — M9902 Segmental and somatic dysfunction of thoracic region: Secondary | ICD-10-CM | POA: Diagnosis not present

## 2016-06-04 DIAGNOSIS — M9903 Segmental and somatic dysfunction of lumbar region: Secondary | ICD-10-CM | POA: Diagnosis not present

## 2016-06-04 DIAGNOSIS — M9901 Segmental and somatic dysfunction of cervical region: Secondary | ICD-10-CM | POA: Diagnosis not present

## 2016-06-04 DIAGNOSIS — M25562 Pain in left knee: Secondary | ICD-10-CM | POA: Diagnosis not present

## 2016-06-10 ENCOUNTER — Other Ambulatory Visit: Payer: Self-pay | Admitting: Family Medicine

## 2016-06-12 DIAGNOSIS — L930 Discoid lupus erythematosus: Secondary | ICD-10-CM | POA: Diagnosis not present

## 2016-06-12 DIAGNOSIS — L718 Other rosacea: Secondary | ICD-10-CM | POA: Diagnosis not present

## 2016-06-12 DIAGNOSIS — L821 Other seborrheic keratosis: Secondary | ICD-10-CM | POA: Diagnosis not present

## 2016-06-12 DIAGNOSIS — L570 Actinic keratosis: Secondary | ICD-10-CM | POA: Diagnosis not present

## 2016-06-17 ENCOUNTER — Other Ambulatory Visit: Payer: Self-pay | Admitting: Family Medicine

## 2016-07-15 ENCOUNTER — Other Ambulatory Visit: Payer: Self-pay | Admitting: Family Medicine

## 2016-07-15 ENCOUNTER — Ambulatory Visit (INDEPENDENT_AMBULATORY_CARE_PROVIDER_SITE_OTHER): Payer: Medicare Other | Admitting: Family Medicine

## 2016-07-15 DIAGNOSIS — E784 Other hyperlipidemia: Secondary | ICD-10-CM

## 2016-07-15 DIAGNOSIS — R7303 Prediabetes: Secondary | ICD-10-CM

## 2016-07-15 DIAGNOSIS — I10 Essential (primary) hypertension: Secondary | ICD-10-CM | POA: Diagnosis not present

## 2016-07-15 DIAGNOSIS — E7849 Other hyperlipidemia: Secondary | ICD-10-CM

## 2016-07-15 LAB — BAYER DCA HB A1C WAIVED: HB A1C: 6 % (ref <7.0)

## 2016-07-16 LAB — BMP8+EGFR
BUN/Creatinine Ratio: 19 (ref 12–28)
BUN: 13 mg/dL (ref 8–27)
CO2: 27 mmol/L (ref 18–29)
Calcium: 9.3 mg/dL (ref 8.7–10.3)
Chloride: 100 mmol/L (ref 96–106)
Creatinine, Ser: 0.7 mg/dL (ref 0.57–1.00)
GFR calc Af Amer: 97 mL/min/{1.73_m2} (ref >59)
GFR, EST NON AFRICAN AMERICAN: 84 mL/min/{1.73_m2} (ref >59)
GLUCOSE: 93 mg/dL (ref 65–99)
POTASSIUM: 4.7 mmol/L (ref 3.5–5.2)
SODIUM: 141 mmol/L (ref 134–144)

## 2016-07-16 LAB — CBC WITH DIFFERENTIAL/PLATELET
BASOS ABS: 0 10*3/uL (ref 0.0–0.2)
BASOS: 0 %
EOS (ABSOLUTE): 0.1 10*3/uL (ref 0.0–0.4)
Eos: 2 %
HEMOGLOBIN: 12.7 g/dL (ref 11.1–15.9)
Hematocrit: 38.6 % (ref 34.0–46.6)
IMMATURE GRANS (ABS): 0 10*3/uL (ref 0.0–0.1)
Immature Granulocytes: 0 %
LYMPHS ABS: 2 10*3/uL (ref 0.7–3.1)
Lymphs: 37 %
MCH: 30.7 pg (ref 26.6–33.0)
MCHC: 32.9 g/dL (ref 31.5–35.7)
MCV: 93 fL (ref 79–97)
MONOCYTES: 11 %
Monocytes Absolute: 0.6 10*3/uL (ref 0.1–0.9)
NEUTROS ABS: 2.7 10*3/uL (ref 1.4–7.0)
Neutrophils: 50 %
Platelets: 217 10*3/uL (ref 150–379)
RBC: 4.14 x10E6/uL (ref 3.77–5.28)
RDW: 14.2 % (ref 12.3–15.4)
WBC: 5.4 10*3/uL (ref 3.4–10.8)

## 2016-07-16 LAB — NMR, LIPOPROFILE
CHOLESTEROL: 153 mg/dL (ref 100–199)
HDL Cholesterol by NMR: 53 mg/dL (ref >39)
HDL PARTICLE NUMBER: 30.2 umol/L — AB (ref >=30.5)
LDL PARTICLE NUMBER: 924 nmol/L (ref <1000)
LDL SIZE: 21.8 nm (ref >20.5)
LDL-C: 78 mg/dL (ref 0–99)
LP-IR SCORE: 29 (ref <=45)
SMALL LDL PARTICLE NUMBER: 197 nmol/L (ref <=527)
TRIGLYCERIDES BY NMR: 108 mg/dL (ref 0–149)

## 2016-07-22 ENCOUNTER — Ambulatory Visit (INDEPENDENT_AMBULATORY_CARE_PROVIDER_SITE_OTHER): Payer: Medicare Other

## 2016-07-22 ENCOUNTER — Encounter: Payer: Self-pay | Admitting: Family Medicine

## 2016-07-22 ENCOUNTER — Ambulatory Visit (INDEPENDENT_AMBULATORY_CARE_PROVIDER_SITE_OTHER): Payer: Medicare Other | Admitting: Family Medicine

## 2016-07-22 VITALS — BP 139/66 | HR 50 | Temp 96.7°F | Ht 60.5 in | Wt 169.0 lb

## 2016-07-22 DIAGNOSIS — I2583 Coronary atherosclerosis due to lipid rich plaque: Secondary | ICD-10-CM

## 2016-07-22 DIAGNOSIS — Z780 Asymptomatic menopausal state: Secondary | ICD-10-CM | POA: Diagnosis not present

## 2016-07-22 DIAGNOSIS — Z13820 Encounter for screening for osteoporosis: Secondary | ICD-10-CM

## 2016-07-22 DIAGNOSIS — I2510 Atherosclerotic heart disease of native coronary artery without angina pectoris: Secondary | ICD-10-CM

## 2016-07-22 DIAGNOSIS — E784 Other hyperlipidemia: Secondary | ICD-10-CM | POA: Diagnosis not present

## 2016-07-22 DIAGNOSIS — I10 Essential (primary) hypertension: Secondary | ICD-10-CM | POA: Diagnosis not present

## 2016-07-22 DIAGNOSIS — R7303 Prediabetes: Secondary | ICD-10-CM

## 2016-07-22 DIAGNOSIS — E559 Vitamin D deficiency, unspecified: Secondary | ICD-10-CM

## 2016-07-22 DIAGNOSIS — E7849 Other hyperlipidemia: Secondary | ICD-10-CM

## 2016-07-22 NOTE — Patient Instructions (Addendum)
Medicare Annual Wellness Visit  [facility] and the medical providers at [facility] strive to bring you the best medical care.  In doing so we not only want to address your current medical conditions and concerns but also to detect new conditions early and prevent illness, disease and health-related problems.    Medicare offers a yearly Wellness Visit which allows our clinical staff to assess your need for preventative services including immunizations, lifestyle education, counseling to decrease risk of preventable diseases and screening for fall risk and other medical concerns.    This visit is provided free of charge (no copay) for all Medicare recipients. The clinical pharmacists at [facility] have begun to conduct these Wellness Visits which will also include a thorough review of all your medications.    As you primary medical provider recommend that you make an appointment for your Annual Wellness Visit if you have not done so already this year.  You may set up this appointment before you leave today or you may call back [id number]) and schedule an appointment.  Please make sure when you call that you mention that you are scheduling your Annual Wellness Visit with the clinical pharmacist so that the appointment may be made for the proper length of time.     Continue current medications. Continue good therapeutic lifestyle changes which include good diet and exercise. Fall precautions discussed with patient. If an FOBT was given today- please return it to our front desk. If you are over 59 years old - you may need Prevnar 17 or the adult Pneumonia vaccine.  **Flu shots are available--- please call and schedule a FLU-CLINIC appointment**  After your visit with [place] today you will receive a survey in the mail or online from [employer] regarding your care with [place]. Please take a moment to fill this out. Your feedback is very  important to [place] as you can help [place] better understand your patient needs as well as improve your experience and satisfaction. WE CARE ABOUT YOU!!!   Continue to monitor blood pressure readings and watch sodium intake. Bring readings by for review in 3-4 weeks and at the time of the next visit.

## 2016-07-22 NOTE — Progress Notes (Signed)
Subjective:    Patient ID: Misty Smith, female    DOB: Mar 22, 1941, 75 y.o.   MRN: [id number]  HPI Pt here for follow up and management of chronic medical problems which includes hyperlipidemia and hypertension. She is taking medications regularly.The patient is doing well today with no complaints. She comes to the visit today with her husband. She is due to get a DEXA scan and will be given an FOBT to return. Her lab work will be reviewed with her during the visit. All of her cholesterol numbers were good although the good cholesterol is slightly decreased compared to before. The blood sugar was good renal function was good A1c was 6.0 and hemoglobin and white blood cell count were normal. The patient today has no specific complaints as mentioned she does mention that she does remain stressed at most times the cause of worrying about her husband. She is reassured about this and has done an excellent job with taking care of him. She will get a DEXA scan today and will be given an FOBT to return. All her lab work was reviewed with her and everything was very good. She was given a copy of the lab work to take on.    Patient Active Problem List   Diagnosis Date Noted  . Coronary artery disease due to lipid rich plaque 03/18/2016  . Anxiety state 03/18/2016  . Vitamin D deficiency 03/18/2016  . 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 CAD (coronary artery disease) 12/30/2013  . 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 Osteopenia of the elderly 10/26/2013  . Generalized anxiety disorder 09/21/2013  . GERD (gastroesophageal reflux disease) 09/21/2013  . Pulmonary nodule seen on imaging study 09/21/2013  . SYNCOPE 02/02/2009  . Pre-diabetes 02/01/2009  . Hyperlipidemia 02/01/2009  . Essential hypertension 02/01/2009  . DIZZINESS 02/01/2009  . PALPITATIONS 02/01/2009   Outpatient Encounter Prescriptions as of 07/22/2016  Medication Sig  . ALPRAZolam (XANAX) 0.25 MG tablet Take 0.25 mg by  mouth at bedtime as needed for anxiety.   Marland Kitchen aspirin EC 81 MG tablet Take 1 tablet (81 mg total) by mouth daily.  Marland Kitchen atorvastatin (LIPITOR) 80 MG tablet Take 0.5 tablets (40 mg total) by mouth daily. TAKE AS DIRECTED  . Calcium Carb-Cholecalciferol (CALTRATE 600+D) 600-800 MG-UNIT TABS Take 1 tablet by mouth every evening.   . cholecalciferol (VITAMIN D) 1000 UNITS tablet Take 2,000 Units by mouth daily.  . clobetasol (TEMOVATE) 0.05 % external solution Apply 1 application topically 2 (two) times daily.  Marland Kitchen desonide (DESOWEN) 0.05 % cream Apply 1 application topically 2 (two) times daily as needed (for rash).   Marland Kitchen escitalopram (LEXAPRO) 20 MG tablet Take 0.5 tablets (10 mg total) by mouth daily.  Marland Kitchen esomeprazole (NEXIUM) 40 MG capsule TAKE 1 CAPSULE BY MOUTH ONCE A DAY  . estradiol (ESTRACE VAGINAL) 0.1 MG/GM vaginal cream Place 2 g vaginally See admin instructions. Place 2 g vaginally 2-3 times weekly.  . Halcinonide (HALOG) 0.1 % CREA Apply 1 application topically 2 (two) times daily.  Marland Kitchen lisinopril (PRINIVIL,ZESTRIL) 30 MG tablet Take 1 tablet (30 mg total) by mouth daily.  . metoprolol tartrate (LOPRESSOR) 25 MG tablet Take 1 tablet (25 mg total) by mouth 2 (two) times daily.  . Multiple Vitamin (MULTIVITAMIN) tablet Take 1 tablet by mouth every morning.   Marland Kitchen omega-3 acid ethyl esters (LOVAZA) 1 g capsule TAKE 2 CAPSULES BY MOUTH TWICE A DAY  . pioglitazone (ACTOS) 30 MG tablet TAKE 1 TABLET (30 MG TOTAL)  BY MOUTH DAILY.  . [DISCONTINUED] metoprolol tartrate (LOPRESSOR) 25 MG tablet TAKE ONE TABLET (25MG ) BY MOUTH IN THE AM AND TWO TABLETS (50MG ) BY MOUTH IN THE PM.   No facility-administered encounter medications on file as of 07/22/2016.       Review of Systems  Constitutional: Negative.   HENT: Negative.   Eyes: Negative.   Respiratory: Negative.   Cardiovascular: Negative.   Gastrointestinal: Negative.   Endocrine: Negative.   Genitourinary: Negative.   Musculoskeletal: Negative.     Skin: Negative.   Allergic/Immunologic: Negative.   Neurological: Negative.   Hematological: Negative.   Psychiatric/Behavioral: Negative.        Objective:   Physical Exam  Constitutional: She is oriented to person, place, and time. She appears well-developed and well-nourished. No distress.  Pleasant and alert.  HENT:  Head: Normocephalic and atraumatic.  Right Ear: External ear normal.  Left Ear: External ear normal.  Nose: Nose normal.  Mouth/Throat: Oropharynx is clear and moist.  Eyes: Conjunctivae and EOM are normal. Pupils are equal, round, and reactive to light. Right eye exhibits no discharge. Left eye exhibits no discharge. No scleral icterus.  Neck: Normal range of motion. Neck supple. No thyromegaly present.  No bruits thyromegaly or anterior cervical adenopathy  Cardiovascular: Normal rate, regular rhythm, normal heart sounds and intact distal pulses.   No murmur heard. The heart was regular at 56-60/m.  Pulmonary/Chest: Effort normal and breath sounds normal. No respiratory distress. She has no wheezes. She has no rales.  Clear anteriorly and posteriorly  Abdominal: Soft. Bowel sounds are normal. She exhibits no mass. There is no tenderness. There is no rebound and no guarding.  There is generalized abdominal tenderness in the left upper quadrant right upper quadrant and epigastric area. She says she is always sensitive with this abdominal exam. There is no inguinal adenopathy present.  Musculoskeletal: Normal range of motion. She exhibits no edema.  Lymphadenopathy:    She has no cervical adenopathy.  Neurological: She is alert and oriented to person, place, and time. She has normal reflexes. No cranial nerve deficit.  Skin: Skin is warm and dry. No rash noted.  Psychiatric: She has a normal mood and affect. Her behavior is normal. Judgment and thought content normal.  Nursing note and vitals reviewed.  BP (!) 153/68 (BP Location: Left Wrist)   Pulse (!) 50    Temp (!) 96.7 F (35.9 C) (Oral)   Ht 5' 0.5" (1.537 m)   Wt 169 lb (76.7 kg)   BMI 32.46 kg/m   Blood pressure readings today were higher than expected. She is been on a higher dose of lisinopril. She is very uptight and anxious and will not change her medicine today. We will ask her to bring back readings for review at her convenience.      Assessment & Plan:  1. Other hyperlipidemia -All cholesterol numbers were good including triglycerides. Her good cholesterol remains low. She will continue with current treatment and with as aggressive therapeutic lifestyle changes as possible.  2. Essential hypertension -The systolic reading was high today. We will not change her treatment because she was somewhat anxious regarding her husband and trying to take care of him. We will ask her to bring some readings by for review at her convenience.  3. Pre-diabetes -Continue with Actos and aggressive therapeutic lifestyle changes which include diet and exercise. She was originally put on Actos for metabolic syndrome and she understands this.  4. Vitamin D deficiency -Continue  current treatment - DG [facility] DEXA; Future  5. Screening for osteoporosis - DG [facility] DEXA; Future  6. Postmenopausal - DG [facility] DEXA; Future  Patient Instructions                       Medicare Annual Wellness Visit  [facility] and the medical providers at [facility] strive to bring you the best medical care.  In doing so we not only want to address your current medical conditions and concerns but also to detect new conditions early and prevent illness, disease and health-related problems.    Medicare offers a yearly Wellness Visit which allows our clinical staff to assess your need for preventative services including immunizations, lifestyle education, counseling to decrease risk of preventable diseases and screening for fall risk and other medical concerns.    This visit is provided free of charge  (no copay) for all Medicare recipients. The clinical pharmacists at [facility] have begun to conduct these Wellness Visits which will also include a thorough review of all your medications.    As you primary medical provider recommend that you make an appointment for your Annual Wellness Visit if you have not done so already this year.  You may set up this appointment before you leave today or you may call back [phone] and schedule an appointment.  Please make sure when you call that you mention that you are scheduling your Annual Wellness Visit with the clinical pharmacist so that the appointment may be made for the proper length of time.     Continue current medications. Continue good therapeutic lifestyle changes which include good diet and exercise. Fall precautions discussed with patient. If an FOBT was given today- please return it to our front desk. If you are over 55 years old - you may need Prevnar 32 or the adult Pneumonia vaccine.  **Flu shots are available--- please call and schedule a FLU-CLINIC appointment**  After your visit with [place] today you will receive a survey in the mail or online from [employer] regarding your care with [place]. Please take a moment to fill this out. Your feedback is very important to [place] as you can help [place] better understand your patient needs as well as improve your experience and satisfaction. WE CARE ABOUT YOU!!!   Continue to monitor blood pressure readings and watch sodium intake. Bring readings by for review in 3-4 weeks and at the time of the next visit.  Arrie Senate MD

## 2016-07-28 ENCOUNTER — Other Ambulatory Visit: Payer: Medicare Other

## 2016-07-28 DIAGNOSIS — Z1211 Encounter for screening for malignant neoplasm of colon: Secondary | ICD-10-CM | POA: Diagnosis not present

## 2016-07-30 LAB — FECAL OCCULT BLOOD, IMMUNOCHEMICAL: FECAL OCCULT BLD: NEGATIVE

## 2016-07-31 DIAGNOSIS — L570 Actinic keratosis: Secondary | ICD-10-CM | POA: Diagnosis not present

## 2016-07-31 DIAGNOSIS — L930 Discoid lupus erythematosus: Secondary | ICD-10-CM | POA: Diagnosis not present

## 2016-07-31 DIAGNOSIS — L718 Other rosacea: Secondary | ICD-10-CM | POA: Diagnosis not present

## 2016-08-19 NOTE — Progress Notes (Signed)
This visit was a lab encounter only and was not seen by provider

## 2016-08-28 DIAGNOSIS — H2513 Age-related nuclear cataract, bilateral: Secondary | ICD-10-CM | POA: Diagnosis not present

## 2016-08-28 DIAGNOSIS — D3132 Benign neoplasm of left choroid: Secondary | ICD-10-CM | POA: Diagnosis not present

## 2016-08-28 DIAGNOSIS — E119 Type 2 diabetes mellitus without complications: Secondary | ICD-10-CM | POA: Diagnosis not present

## 2016-08-28 DIAGNOSIS — H40013 Open angle with borderline findings, low risk, bilateral: Secondary | ICD-10-CM | POA: Diagnosis not present

## 2016-08-28 DIAGNOSIS — H04123 Dry eye syndrome of bilateral lacrimal glands: Secondary | ICD-10-CM | POA: Diagnosis not present

## 2016-09-03 DIAGNOSIS — M9901 Segmental and somatic dysfunction of cervical region: Secondary | ICD-10-CM | POA: Diagnosis not present

## 2016-09-03 DIAGNOSIS — M25562 Pain in left knee: Secondary | ICD-10-CM | POA: Diagnosis not present

## 2016-09-03 DIAGNOSIS — M9903 Segmental and somatic dysfunction of lumbar region: Secondary | ICD-10-CM | POA: Diagnosis not present

## 2016-09-03 DIAGNOSIS — M5430 Sciatica, unspecified side: Secondary | ICD-10-CM | POA: Diagnosis not present

## 2016-09-03 DIAGNOSIS — M9905 Segmental and somatic dysfunction of pelvic region: Secondary | ICD-10-CM | POA: Diagnosis not present

## 2016-09-03 DIAGNOSIS — M9904 Segmental and somatic dysfunction of sacral region: Secondary | ICD-10-CM | POA: Diagnosis not present

## 2016-09-03 DIAGNOSIS — M9902 Segmental and somatic dysfunction of thoracic region: Secondary | ICD-10-CM | POA: Diagnosis not present

## 2016-09-09 ENCOUNTER — Other Ambulatory Visit: Payer: Self-pay | Admitting: Family Medicine

## 2016-09-16 DIAGNOSIS — D485 Neoplasm of uncertain behavior of skin: Secondary | ICD-10-CM | POA: Diagnosis not present

## 2016-09-16 DIAGNOSIS — D2239 Melanocytic nevi of other parts of face: Secondary | ICD-10-CM | POA: Diagnosis not present

## 2016-09-16 DIAGNOSIS — L821 Other seborrheic keratosis: Secondary | ICD-10-CM | POA: Diagnosis not present

## 2016-09-16 DIAGNOSIS — L814 Other melanin hyperpigmentation: Secondary | ICD-10-CM | POA: Diagnosis not present

## 2016-09-24 DIAGNOSIS — M9904 Segmental and somatic dysfunction of sacral region: Secondary | ICD-10-CM | POA: Diagnosis not present

## 2016-09-24 DIAGNOSIS — M9901 Segmental and somatic dysfunction of cervical region: Secondary | ICD-10-CM | POA: Diagnosis not present

## 2016-09-24 DIAGNOSIS — M9903 Segmental and somatic dysfunction of lumbar region: Secondary | ICD-10-CM | POA: Diagnosis not present

## 2016-09-24 DIAGNOSIS — M5430 Sciatica, unspecified side: Secondary | ICD-10-CM | POA: Diagnosis not present

## 2016-09-24 DIAGNOSIS — M25562 Pain in left knee: Secondary | ICD-10-CM | POA: Diagnosis not present

## 2016-09-24 DIAGNOSIS — M9902 Segmental and somatic dysfunction of thoracic region: Secondary | ICD-10-CM | POA: Diagnosis not present

## 2016-09-24 DIAGNOSIS — M9905 Segmental and somatic dysfunction of pelvic region: Secondary | ICD-10-CM | POA: Diagnosis not present

## 2016-09-25 ENCOUNTER — Other Ambulatory Visit: Payer: Self-pay | Admitting: Family Medicine

## 2016-10-08 DIAGNOSIS — M5430 Sciatica, unspecified side: Secondary | ICD-10-CM | POA: Diagnosis not present

## 2016-10-08 DIAGNOSIS — M9905 Segmental and somatic dysfunction of pelvic region: Secondary | ICD-10-CM | POA: Diagnosis not present

## 2016-10-08 DIAGNOSIS — M25562 Pain in left knee: Secondary | ICD-10-CM | POA: Diagnosis not present

## 2016-10-08 DIAGNOSIS — M9904 Segmental and somatic dysfunction of sacral region: Secondary | ICD-10-CM | POA: Diagnosis not present

## 2016-10-08 DIAGNOSIS — M9902 Segmental and somatic dysfunction of thoracic region: Secondary | ICD-10-CM | POA: Diagnosis not present

## 2016-10-08 DIAGNOSIS — M9903 Segmental and somatic dysfunction of lumbar region: Secondary | ICD-10-CM | POA: Diagnosis not present

## 2016-10-08 DIAGNOSIS — M9901 Segmental and somatic dysfunction of cervical region: Secondary | ICD-10-CM | POA: Diagnosis not present

## 2016-10-16 ENCOUNTER — Other Ambulatory Visit: Payer: Self-pay | Admitting: Orthopedic Surgery

## 2016-10-16 ENCOUNTER — Ambulatory Visit (INDEPENDENT_AMBULATORY_CARE_PROVIDER_SITE_OTHER): Payer: Medicare Other

## 2016-10-16 ENCOUNTER — Other Ambulatory Visit (INDEPENDENT_AMBULATORY_CARE_PROVIDER_SITE_OTHER): Payer: Medicare Other

## 2016-10-16 DIAGNOSIS — M25511 Pain in right shoulder: Secondary | ICD-10-CM

## 2016-10-16 DIAGNOSIS — R52 Pain, unspecified: Secondary | ICD-10-CM

## 2016-10-16 DIAGNOSIS — M7522 Bicipital tendinitis, left shoulder: Secondary | ICD-10-CM | POA: Diagnosis not present

## 2016-10-16 DIAGNOSIS — M25512 Pain in left shoulder: Secondary | ICD-10-CM | POA: Diagnosis not present

## 2016-10-16 DIAGNOSIS — M7061 Trochanteric bursitis, right hip: Secondary | ICD-10-CM | POA: Diagnosis not present

## 2016-10-22 DIAGNOSIS — M9902 Segmental and somatic dysfunction of thoracic region: Secondary | ICD-10-CM | POA: Diagnosis not present

## 2016-10-22 DIAGNOSIS — M9901 Segmental and somatic dysfunction of cervical region: Secondary | ICD-10-CM | POA: Diagnosis not present

## 2016-10-22 DIAGNOSIS — M9903 Segmental and somatic dysfunction of lumbar region: Secondary | ICD-10-CM | POA: Diagnosis not present

## 2016-10-22 DIAGNOSIS — M5430 Sciatica, unspecified side: Secondary | ICD-10-CM | POA: Diagnosis not present

## 2016-10-22 DIAGNOSIS — M9904 Segmental and somatic dysfunction of sacral region: Secondary | ICD-10-CM | POA: Diagnosis not present

## 2016-10-22 DIAGNOSIS — M9905 Segmental and somatic dysfunction of pelvic region: Secondary | ICD-10-CM | POA: Diagnosis not present

## 2016-10-22 DIAGNOSIS — M25562 Pain in left knee: Secondary | ICD-10-CM | POA: Diagnosis not present

## 2016-11-05 DIAGNOSIS — M9904 Segmental and somatic dysfunction of sacral region: Secondary | ICD-10-CM | POA: Diagnosis not present

## 2016-11-05 DIAGNOSIS — M9903 Segmental and somatic dysfunction of lumbar region: Secondary | ICD-10-CM | POA: Diagnosis not present

## 2016-11-05 DIAGNOSIS — M9902 Segmental and somatic dysfunction of thoracic region: Secondary | ICD-10-CM | POA: Diagnosis not present

## 2016-11-05 DIAGNOSIS — M9905 Segmental and somatic dysfunction of pelvic region: Secondary | ICD-10-CM | POA: Diagnosis not present

## 2016-11-05 DIAGNOSIS — M5430 Sciatica, unspecified side: Secondary | ICD-10-CM | POA: Diagnosis not present

## 2016-11-05 DIAGNOSIS — M9901 Segmental and somatic dysfunction of cervical region: Secondary | ICD-10-CM | POA: Diagnosis not present

## 2016-11-05 DIAGNOSIS — M25562 Pain in left knee: Secondary | ICD-10-CM | POA: Diagnosis not present

## 2016-11-17 DIAGNOSIS — M9903 Segmental and somatic dysfunction of lumbar region: Secondary | ICD-10-CM | POA: Diagnosis not present

## 2016-11-17 DIAGNOSIS — M9902 Segmental and somatic dysfunction of thoracic region: Secondary | ICD-10-CM | POA: Diagnosis not present

## 2016-11-17 DIAGNOSIS — M9904 Segmental and somatic dysfunction of sacral region: Secondary | ICD-10-CM | POA: Diagnosis not present

## 2016-11-17 DIAGNOSIS — M9901 Segmental and somatic dysfunction of cervical region: Secondary | ICD-10-CM | POA: Diagnosis not present

## 2016-11-17 DIAGNOSIS — M25562 Pain in left knee: Secondary | ICD-10-CM | POA: Diagnosis not present

## 2016-11-17 DIAGNOSIS — M5430 Sciatica, unspecified side: Secondary | ICD-10-CM | POA: Diagnosis not present

## 2016-11-17 DIAGNOSIS — M9905 Segmental and somatic dysfunction of pelvic region: Secondary | ICD-10-CM | POA: Diagnosis not present

## 2016-11-20 DIAGNOSIS — M9901 Segmental and somatic dysfunction of cervical region: Secondary | ICD-10-CM | POA: Diagnosis not present

## 2016-11-20 DIAGNOSIS — M5430 Sciatica, unspecified side: Secondary | ICD-10-CM | POA: Diagnosis not present

## 2016-11-20 DIAGNOSIS — M9905 Segmental and somatic dysfunction of pelvic region: Secondary | ICD-10-CM | POA: Diagnosis not present

## 2016-11-20 DIAGNOSIS — M9902 Segmental and somatic dysfunction of thoracic region: Secondary | ICD-10-CM | POA: Diagnosis not present

## 2016-11-20 DIAGNOSIS — M25562 Pain in left knee: Secondary | ICD-10-CM | POA: Diagnosis not present

## 2016-11-20 DIAGNOSIS — M9903 Segmental and somatic dysfunction of lumbar region: Secondary | ICD-10-CM | POA: Diagnosis not present

## 2016-11-20 DIAGNOSIS — M9904 Segmental and somatic dysfunction of sacral region: Secondary | ICD-10-CM | POA: Diagnosis not present

## 2016-11-24 ENCOUNTER — Other Ambulatory Visit (INDEPENDENT_AMBULATORY_CARE_PROVIDER_SITE_OTHER): Payer: Medicare Other

## 2016-11-24 DIAGNOSIS — E559 Vitamin D deficiency, unspecified: Secondary | ICD-10-CM

## 2016-11-24 DIAGNOSIS — R7303 Prediabetes: Secondary | ICD-10-CM | POA: Diagnosis not present

## 2016-11-24 DIAGNOSIS — I10 Essential (primary) hypertension: Secondary | ICD-10-CM

## 2016-11-24 DIAGNOSIS — E784 Other hyperlipidemia: Secondary | ICD-10-CM | POA: Diagnosis not present

## 2016-11-24 DIAGNOSIS — E7849 Other hyperlipidemia: Secondary | ICD-10-CM

## 2016-11-25 DIAGNOSIS — M25562 Pain in left knee: Secondary | ICD-10-CM | POA: Diagnosis not present

## 2016-11-25 DIAGNOSIS — M9905 Segmental and somatic dysfunction of pelvic region: Secondary | ICD-10-CM | POA: Diagnosis not present

## 2016-11-25 DIAGNOSIS — M9904 Segmental and somatic dysfunction of sacral region: Secondary | ICD-10-CM | POA: Diagnosis not present

## 2016-11-25 DIAGNOSIS — M5430 Sciatica, unspecified side: Secondary | ICD-10-CM | POA: Diagnosis not present

## 2016-11-25 DIAGNOSIS — M9901 Segmental and somatic dysfunction of cervical region: Secondary | ICD-10-CM | POA: Diagnosis not present

## 2016-11-25 DIAGNOSIS — M9902 Segmental and somatic dysfunction of thoracic region: Secondary | ICD-10-CM | POA: Diagnosis not present

## 2016-11-25 DIAGNOSIS — M9903 Segmental and somatic dysfunction of lumbar region: Secondary | ICD-10-CM | POA: Diagnosis not present

## 2016-11-25 LAB — BMP8+EGFR
BUN/Creatinine Ratio: 18 (ref 12–28)
BUN: 13 mg/dL (ref 8–27)
CALCIUM: 9.6 mg/dL (ref 8.7–10.3)
CHLORIDE: 97 mmol/L (ref 96–106)
CO2: 27 mmol/L (ref 18–29)
Creatinine, Ser: 0.71 mg/dL (ref 0.57–1.00)
GFR calc Af Amer: 95 mL/min/{1.73_m2} (ref >59)
GFR calc non Af Amer: 82 mL/min/{1.73_m2} (ref >59)
Glucose: 98 mg/dL (ref 65–99)
POTASSIUM: 4.7 mmol/L (ref 3.5–5.2)
Sodium: 139 mmol/L (ref 134–144)

## 2016-11-25 LAB — CBC WITH DIFFERENTIAL/PLATELET
BASOS: 0 %
Basophils Absolute: 0 10*3/uL (ref 0.0–0.2)
EOS (ABSOLUTE): 0.1 10*3/uL (ref 0.0–0.4)
Eos: 2 %
Hematocrit: 38.8 % (ref 34.0–46.6)
Hemoglobin: 12.8 g/dL (ref 11.1–15.9)
IMMATURE GRANS (ABS): 0 10*3/uL (ref 0.0–0.1)
Immature Granulocytes: 0 %
Lymphocytes Absolute: 1.8 10*3/uL (ref 0.7–3.1)
Lymphs: 32 %
MCH: 31.1 pg (ref 26.6–33.0)
MCHC: 33 g/dL (ref 31.5–35.7)
MCV: 94 fL (ref 79–97)
MONOS ABS: 0.7 10*3/uL (ref 0.1–0.9)
Monocytes: 13 %
NEUTROS ABS: 3.1 10*3/uL (ref 1.4–7.0)
Neutrophils: 53 %
PLATELETS: 231 10*3/uL (ref 150–379)
RBC: 4.12 x10E6/uL (ref 3.77–5.28)
RDW: 15 % (ref 12.3–15.4)
WBC: 5.8 10*3/uL (ref 3.4–10.8)

## 2016-11-25 LAB — HEPATIC FUNCTION PANEL
ALT: 33 IU/L — AB (ref 0–32)
AST: 39 IU/L (ref 0–40)
Albumin: 4.1 g/dL (ref 3.5–4.8)
Alkaline Phosphatase: 67 IU/L (ref 39–117)
Bilirubin Total: 0.3 mg/dL (ref 0.0–1.2)
Bilirubin, Direct: 0.1 mg/dL (ref 0.00–0.40)
Total Protein: 6.4 g/dL (ref 6.0–8.5)

## 2016-11-25 LAB — LIPID PANEL
CHOLESTEROL TOTAL: 150 mg/dL (ref 100–199)
Chol/HDL Ratio: 3.1 ratio units (ref 0.0–4.4)
HDL: 49 mg/dL (ref >39)
LDL Calculated: 69 mg/dL (ref 0–99)
TRIGLYCERIDES: 159 mg/dL — AB (ref 0–149)
VLDL Cholesterol Cal: 32 mg/dL (ref 5–40)

## 2016-11-25 LAB — VITAMIN D 25 HYDROXY (VIT D DEFICIENCY, FRACTURES): VIT D 25 HYDROXY: 59.4 ng/mL (ref 30.0–100.0)

## 2016-11-27 DIAGNOSIS — M9903 Segmental and somatic dysfunction of lumbar region: Secondary | ICD-10-CM | POA: Diagnosis not present

## 2016-11-27 DIAGNOSIS — M25562 Pain in left knee: Secondary | ICD-10-CM | POA: Diagnosis not present

## 2016-11-27 DIAGNOSIS — M9904 Segmental and somatic dysfunction of sacral region: Secondary | ICD-10-CM | POA: Diagnosis not present

## 2016-11-27 DIAGNOSIS — M9901 Segmental and somatic dysfunction of cervical region: Secondary | ICD-10-CM | POA: Diagnosis not present

## 2016-11-27 DIAGNOSIS — M9902 Segmental and somatic dysfunction of thoracic region: Secondary | ICD-10-CM | POA: Diagnosis not present

## 2016-11-27 DIAGNOSIS — M5430 Sciatica, unspecified side: Secondary | ICD-10-CM | POA: Diagnosis not present

## 2016-11-27 DIAGNOSIS — M9905 Segmental and somatic dysfunction of pelvic region: Secondary | ICD-10-CM | POA: Diagnosis not present

## 2016-12-01 DIAGNOSIS — M5430 Sciatica, unspecified side: Secondary | ICD-10-CM | POA: Diagnosis not present

## 2016-12-01 DIAGNOSIS — M9902 Segmental and somatic dysfunction of thoracic region: Secondary | ICD-10-CM | POA: Diagnosis not present

## 2016-12-01 DIAGNOSIS — M9903 Segmental and somatic dysfunction of lumbar region: Secondary | ICD-10-CM | POA: Diagnosis not present

## 2016-12-01 DIAGNOSIS — M9904 Segmental and somatic dysfunction of sacral region: Secondary | ICD-10-CM | POA: Diagnosis not present

## 2016-12-01 DIAGNOSIS — M25562 Pain in left knee: Secondary | ICD-10-CM | POA: Diagnosis not present

## 2016-12-01 DIAGNOSIS — M9905 Segmental and somatic dysfunction of pelvic region: Secondary | ICD-10-CM | POA: Diagnosis not present

## 2016-12-01 DIAGNOSIS — M9901 Segmental and somatic dysfunction of cervical region: Secondary | ICD-10-CM | POA: Diagnosis not present

## 2016-12-03 ENCOUNTER — Ambulatory Visit (INDEPENDENT_AMBULATORY_CARE_PROVIDER_SITE_OTHER): Payer: Medicare Other

## 2016-12-03 ENCOUNTER — Ambulatory Visit (INDEPENDENT_AMBULATORY_CARE_PROVIDER_SITE_OTHER): Payer: Medicare Other | Admitting: Family Medicine

## 2016-12-03 ENCOUNTER — Encounter: Payer: Self-pay | Admitting: Family Medicine

## 2016-12-03 VITALS — BP 158/74 | HR 50 | Temp 97.0°F | Ht 60.5 in | Wt 169.0 lb

## 2016-12-03 DIAGNOSIS — M79645 Pain in left finger(s): Secondary | ICD-10-CM

## 2016-12-03 DIAGNOSIS — I10 Essential (primary) hypertension: Secondary | ICD-10-CM

## 2016-12-03 DIAGNOSIS — R7303 Prediabetes: Secondary | ICD-10-CM | POA: Diagnosis not present

## 2016-12-03 DIAGNOSIS — E7849 Other hyperlipidemia: Secondary | ICD-10-CM

## 2016-12-03 DIAGNOSIS — M545 Low back pain: Secondary | ICD-10-CM

## 2016-12-03 DIAGNOSIS — E784 Other hyperlipidemia: Secondary | ICD-10-CM | POA: Diagnosis not present

## 2016-12-03 DIAGNOSIS — E559 Vitamin D deficiency, unspecified: Secondary | ICD-10-CM

## 2016-12-03 DIAGNOSIS — M329 Systemic lupus erythematosus, unspecified: Secondary | ICD-10-CM

## 2016-12-03 MED ORDER — ESTRADIOL 0.1 MG/GM VA CREA
2.0000 g | TOPICAL_CREAM | VAGINAL | 3 refills | Status: DC
Start: 2016-12-03 — End: 2018-09-28

## 2016-12-03 NOTE — Progress Notes (Signed)
Subjective:    Patient ID: Misty Smith, female    DOB: 1941-03-11, 76 y.o.   MRN: [id number]  HPI Pt here for follow up and management of chronic medical problems which includes hyperlipidemia and hypertension. She is taking medication regularly.The patient complains of problems with her left thumb secondary to a fall. She is requesting a refill on her Estrace. She is due to get a chest x-ray soon. Her lab work is been done and this will be reviewed with her during the visit today. The vitamin D level was good at 59.4. The CBC was within normal limits. All liver function tests were within normal limits except the ALT was elevated by 1 point. The blood sugar is good at 98. The creatinine and electrolytes including potassium were good. Cholesterol numbers with traditional lipid testing have an LDL C that was good at 69. Triglycerides were slightly elevated at 159 and she had a good HDL at 49. The patient denies any chest pain other than occasionally some funny feelings in the left sternal border area when she is sitting occasionally. She denies any more shortness of breath than expected. She is swallowing her food without problems. She denies any nausea vomiting or diarrhea. She has had recently a couple episodes of bright red blood in the stool. She is planning to get one more colonoscopy and plans to set that up. Her hemoglobin was stable on her blood work. She is passing her water without problems. She says that using Estrace has helped her and I reminded her that if she is going to use this that she will need to have a pelvic exam and she is agreed to do this. She does not have any blood pressure readings to report to me from home and we will give her a record sheet to record readings and return these to [place].   Patient Active Problem List   Diagnosis Date Noted  . Coronary artery disease due to lipid rich plaque 03/18/2016  . Anxiety state 03/18/2016  . Vitamin D deficiency 03/18/2016  . Gall  bladder disease 03/02/2014  . CAD (coronary artery disease) 12/30/2013  . 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 Osteopenia of the elderly 10/26/2013  . Generalized anxiety disorder 09/21/2013  . GERD (gastroesophageal reflux disease) 09/21/2013  . Pulmonary nodule seen on imaging study 09/21/2013  . SYNCOPE 02/02/2009  . Pre-diabetes 02/01/2009  . Hyperlipidemia 02/01/2009  . Essential hypertension 02/01/2009  . DIZZINESS 02/01/2009  . PALPITATIONS 02/01/2009   Outpatient Encounter Prescriptions as of 12/03/2016  Medication Sig  . ALPRAZolam (XANAX) 0.25 MG tablet Take 0.25 mg by mouth at bedtime as needed for anxiety.   Marland Kitchen aspirin EC 81 MG tablet Take 1 tablet (81 mg total) by mouth daily.  Marland Kitchen atorvastatin (LIPITOR) 80 MG tablet TAKE AS DIRECTED  . Calcium Carb-Cholecalciferol (CALTRATE 600+D) 600-800 MG-UNIT TABS Take 1 tablet by mouth every evening.   . cholecalciferol (VITAMIN D) 1000 UNITS tablet Take 2,000 Units by mouth daily.  . clobetasol (TEMOVATE) 0.05 % external solution Apply 1 application topically 2 (two) times daily.  Marland Kitchen desonide (DESOWEN) 0.05 % cream Apply 1 application topically 2 (two) times daily as needed (for rash).   Marland Kitchen escitalopram (LEXAPRO) 20 MG tablet Take 0.5 tablets (10 mg total) by mouth daily.  Marland Kitchen esomeprazole (NEXIUM) 40 MG capsule TAKE 1 CAPSULE BY MOUTH ONCE A DAY  . estradiol (ESTRACE VAGINAL) 0.1 MG/GM vaginal cream Place 2 g vaginally See admin instructions. Place 2 g  vaginally 2-3 times weekly.  . Halcinonide (HALOG) 0.1 % CREA Apply 1 application topically 2 (two) times daily.  Marland Kitchen lisinopril (PRINIVIL,ZESTRIL) 30 MG tablet TAKE 1 TABLET (30 MG TOTAL) BY MOUTH DAILY.  . metoprolol tartrate (LOPRESSOR) 25 MG tablet Take 1 tablet (25 mg total) by mouth 2 (two) times daily.  . Multiple Vitamin (MULTIVITAMIN) tablet Take 1 tablet by mouth every morning.   Marland Kitchen omega-3 acid ethyl esters (LOVAZA) 1 g capsule TAKE 2 CAPSULES BY MOUTH TWICE A DAY  .  pioglitazone (ACTOS) 30 MG tablet TAKE 1 TABLET (30 MG TOTAL) BY MOUTH DAILY.  . [DISCONTINUED] atorvastatin (LIPITOR) 80 MG tablet Take 0.5 tablets (40 mg total) by mouth daily. TAKE AS DIRECTED   No facility-administered encounter medications on file as of 12/03/2016.       Review of Systems  Constitutional: Negative.   HENT: Negative.   Eyes: Negative.   Respiratory: Negative.   Cardiovascular: Negative.   Gastrointestinal: Negative.   Endocrine: Negative.   Genitourinary: Negative.   Musculoskeletal: Positive for arthralgias (left thumb pain - recent fall).  Skin: Negative.   Allergic/Immunologic: Negative.   Neurological: Negative.   Hematological: Negative.   Psychiatric/Behavioral: Negative.        Objective:   Physical Exam  Constitutional: She is oriented to person, place, and time. She appears well-developed and well-nourished. No distress.  HENT:  Head: Normocephalic and atraumatic.  Right Ear: External ear normal.  Left Ear: External ear normal.  Mouth/Throat: Oropharynx is clear and moist. No oropharyngeal exudate.  Nasal congestion right with turbinate swelling right greater than left.  Eyes: Conjunctivae and EOM are normal. Pupils are equal, round, and reactive to light. Right eye exhibits no discharge. Left eye exhibits no discharge. No scleral icterus.  Neck: Normal range of motion. Neck supple. No thyromegaly present.  No bruits or thyromegaly  Cardiovascular: Normal rate, regular rhythm, normal heart sounds and intact distal pulses.   No murmur heard. Heart is regular at 60/m  Pulmonary/Chest: Effort normal and breath sounds normal. No respiratory distress. She has no wheezes. She has no rales.  Clear anteriorly and posteriorly  Abdominal: Soft. Bowel sounds are normal. She exhibits no mass. There is tenderness. There is no rebound and no guarding.  Generalized abdominal tenderness and suprapubic tenderness no liver or spleen enlargement no masses and no  bruits.  Musculoskeletal: She exhibits tenderness. She exhibits no edema.  There is tenderness in the low back area in the lumbar spine area. The patient is having some low back stiffness especially with laying down and sitting up. There is also some tenderness at the PIP joint of the left thumb. There is no swelling at that area.  Lymphadenopathy:    She has no cervical adenopathy.  Neurological: She is alert and oriented to person, place, and time. She has normal reflexes. No cranial nerve deficit.  Skin: Skin is warm and dry. No rash noted.  Psychiatric: She has a normal mood and affect. Her behavior is normal. Judgment and thought content normal.  Nursing note and vitals reviewed.  BP (!) 151/66 (BP Location: Left Arm)   Pulse (!) 50   Temp 97 F (36.1 C) (Oral)   Ht 5' 0.5" (1.537 m)   Wt 169 lb (76.7 kg)   BMI 32.46 kg/m    Repeat blood pressure right arm large cuff 158/74     Assessment & Plan:  1. Other hyperlipidemia -Continue aggressive therapeutic lifestyle changes and Lovaza.  2. Essential hypertension -  The blood pressure remains slightly elevated on the systolic side. She will bring readings from home in a couple weeks and have the nurse recheck the blood pressure here. If it remains elevated she will increase her lisinopril to 40 mg a day at that time.  3. Pre-diabetes -She is on Actos for metabolic syndrome and she will continue to take this.  4. Vitamin D deficiency -The vitamin D level was good and she will continue with her current treatment  5. Thumb pain, left - DG Finger Thumb Left; Future  6. Low back pain, unspecified back pain laterality, unspecified chronicity, with sciatica presence unspecified -X-rays today in follow-up with orthopedic surgeon if needed for injection if back pain persists - DG Lumbar Spine 2-3 Views; Future  7. Systemic lupus erythematosus, unspecified SLE type, unspecified organ involvement status ([place]) -Continue to follow-up  with Dr. Allyson Sabal.  Meds ordered this encounter  Medications  . estradiol (ESTRACE VAGINAL) 0.1 MG/GM vaginal cream    Sig: Place 2 g vaginally See admin instructions. Place 2 g vaginally 2-3 times weekly.    Dispense:  42.5 g    Refill:  3   Patient Instructions                       Medicare Annual Wellness Visit  [facility] and the medical providers at [facility] strive to bring you the best medical care.  In doing so we not only want to address your current medical conditions and concerns but also to detect new conditions early and prevent illness, disease and health-related problems.    Medicare offers a yearly Wellness Visit which allows our clinical staff to assess your need for preventative services including immunizations, lifestyle education, counseling to decrease risk of preventable diseases and screening for fall risk and other medical concerns.    This visit is provided free of charge (no copay) for all Medicare recipients. The clinical pharmacists at [facility] have begun to conduct these Wellness Visits which will also include a thorough review of all your medications.    As you primary medical provider recommend that you make an appointment for your Annual Wellness Visit if you have not done so already this year.  You may set up this appointment before you leave today or you may call back [phone]) and schedule an appointment.  Please make sure when you call that you mention that you are scheduling your Annual Wellness Visit with the clinical pharmacist so that the appointment may be made for the proper length of time.     Continue current medications. Continue good therapeutic lifestyle changes which include good diet and exercise. Fall precautions discussed with patient. If an FOBT was given today- please return it to our front desk. If you are over 60 years old - you may need Prevnar 62 or the adult Pneumonia  vaccine.  **Flu shots are available--- please call and schedule a FLU-CLINIC appointment**  After your visit with [place] today you will receive a survey in the mail or online from [employer] regarding your care with [place]. Please take a moment to fill this out. Your feedback is very important to [place] as you can help [place] better understand your patient needs as well as improve your experience and satisfaction. WE CARE ABOUT YOU!!!   Use nasal saline and nasal saline gel for head congestion Return to clinic and get a pelvic exam because of using Estrace Keep follow-up appointments  with orthopedic surgeon Monitor blood pressures at home and bring readings to the office with a blood pressure by the nurse in the office. If the blood pressure remains elevated we may need to increase the lisinopril to 40 mg daily. Get colonoscopy as planned   Arrie Senate MD

## 2016-12-03 NOTE — Patient Instructions (Addendum)
Medicare Annual Wellness Visit  [facility] and the medical providers at [facility] strive to bring you the best medical care.  In doing so we not only want to address your current medical conditions and concerns but also to detect new conditions early and prevent illness, disease and health-related problems.    Medicare offers a yearly Wellness Visit which allows our clinical staff to assess your need for preventative services including immunizations, lifestyle education, counseling to decrease risk of preventable diseases and screening for fall risk and other medical concerns.    This visit is provided free of charge (no copay) for all Medicare recipients. The clinical pharmacists at [facility] have begun to conduct these Wellness Visits which will also include a thorough review of all your medications.    As you primary medical provider recommend that you make an appointment for your Annual Wellness Visit if you have not done so already this year.  You may set up this appointment before you leave today or you may call back [phone]) and schedule an appointment.  Please make sure when you call that you mention that you are scheduling your Annual Wellness Visit with the clinical pharmacist so that the appointment may be made for the proper length of time.     Continue current medications. Continue good therapeutic lifestyle changes which include good diet and exercise. Fall precautions discussed with patient. If an FOBT was given today- please return it to our front desk. If you are over 78 years old - you may need Prevnar 28 or the adult Pneumonia vaccine.  **Flu shots are available--- please call and schedule a FLU-CLINIC appointment**  After your visit with [place] today you will receive a survey in the mail or online from [employer] regarding your care with [place]. Please take a moment to fill this out. Your feedback is very  important to [place] as you can help [place] better understand your patient needs as well as improve your experience and satisfaction. WE CARE ABOUT YOU!!!   Use nasal saline and nasal saline gel for head congestion Return to clinic and get a pelvic exam because of using Estrace Keep follow-up appointments with orthopedic surgeon Monitor blood pressures at home and bring readings to the office with a blood pressure by the nurse in the office. If the blood pressure remains elevated we may need to increase the lisinopril to 40 mg daily. Get colonoscopy as planned

## 2016-12-05 DIAGNOSIS — M7061 Trochanteric bursitis, right hip: Secondary | ICD-10-CM | POA: Diagnosis not present

## 2016-12-06 ENCOUNTER — Other Ambulatory Visit: Payer: Self-pay | Admitting: Family Medicine

## 2016-12-08 DIAGNOSIS — M9901 Segmental and somatic dysfunction of cervical region: Secondary | ICD-10-CM | POA: Diagnosis not present

## 2016-12-08 DIAGNOSIS — M9904 Segmental and somatic dysfunction of sacral region: Secondary | ICD-10-CM | POA: Diagnosis not present

## 2016-12-08 DIAGNOSIS — M25562 Pain in left knee: Secondary | ICD-10-CM | POA: Diagnosis not present

## 2016-12-08 DIAGNOSIS — M9905 Segmental and somatic dysfunction of pelvic region: Secondary | ICD-10-CM | POA: Diagnosis not present

## 2016-12-08 DIAGNOSIS — M5430 Sciatica, unspecified side: Secondary | ICD-10-CM | POA: Diagnosis not present

## 2016-12-08 DIAGNOSIS — M9903 Segmental and somatic dysfunction of lumbar region: Secondary | ICD-10-CM | POA: Diagnosis not present

## 2016-12-08 DIAGNOSIS — M9902 Segmental and somatic dysfunction of thoracic region: Secondary | ICD-10-CM | POA: Diagnosis not present

## 2016-12-12 DIAGNOSIS — M79642 Pain in left hand: Secondary | ICD-10-CM | POA: Diagnosis not present

## 2016-12-12 DIAGNOSIS — M65312 Trigger thumb, left thumb: Secondary | ICD-10-CM | POA: Diagnosis not present

## 2016-12-15 DIAGNOSIS — M9901 Segmental and somatic dysfunction of cervical region: Secondary | ICD-10-CM | POA: Diagnosis not present

## 2016-12-15 DIAGNOSIS — M25562 Pain in left knee: Secondary | ICD-10-CM | POA: Diagnosis not present

## 2016-12-15 DIAGNOSIS — M9905 Segmental and somatic dysfunction of pelvic region: Secondary | ICD-10-CM | POA: Diagnosis not present

## 2016-12-15 DIAGNOSIS — M9903 Segmental and somatic dysfunction of lumbar region: Secondary | ICD-10-CM | POA: Diagnosis not present

## 2016-12-15 DIAGNOSIS — M5430 Sciatica, unspecified side: Secondary | ICD-10-CM | POA: Diagnosis not present

## 2016-12-15 DIAGNOSIS — M9904 Segmental and somatic dysfunction of sacral region: Secondary | ICD-10-CM | POA: Diagnosis not present

## 2016-12-15 DIAGNOSIS — M9902 Segmental and somatic dysfunction of thoracic region: Secondary | ICD-10-CM | POA: Diagnosis not present

## 2016-12-17 ENCOUNTER — Ambulatory Visit (INDEPENDENT_AMBULATORY_CARE_PROVIDER_SITE_OTHER): Payer: Medicare Other | Admitting: *Deleted

## 2016-12-17 VITALS — BP 138/66 | HR 52

## 2016-12-17 DIAGNOSIS — Z0130 Encounter for examination of blood pressure without abnormal findings: Secondary | ICD-10-CM

## 2016-12-17 NOTE — Progress Notes (Signed)
Pt came in for BP check bp 149 56   p 50 Recheck bp 138 66    p 52

## 2016-12-23 ENCOUNTER — Other Ambulatory Visit: Payer: Self-pay | Admitting: Family Medicine

## 2016-12-29 DIAGNOSIS — M9902 Segmental and somatic dysfunction of thoracic region: Secondary | ICD-10-CM | POA: Diagnosis not present

## 2016-12-29 DIAGNOSIS — M9901 Segmental and somatic dysfunction of cervical region: Secondary | ICD-10-CM | POA: Diagnosis not present

## 2016-12-29 DIAGNOSIS — M9905 Segmental and somatic dysfunction of pelvic region: Secondary | ICD-10-CM | POA: Diagnosis not present

## 2016-12-29 DIAGNOSIS — M5430 Sciatica, unspecified side: Secondary | ICD-10-CM | POA: Diagnosis not present

## 2016-12-29 DIAGNOSIS — M9903 Segmental and somatic dysfunction of lumbar region: Secondary | ICD-10-CM | POA: Diagnosis not present

## 2016-12-29 DIAGNOSIS — M9904 Segmental and somatic dysfunction of sacral region: Secondary | ICD-10-CM | POA: Diagnosis not present

## 2016-12-29 DIAGNOSIS — M25562 Pain in left knee: Secondary | ICD-10-CM | POA: Diagnosis not present

## 2017-01-08 ENCOUNTER — Other Ambulatory Visit: Payer: Self-pay | Admitting: *Deleted

## 2017-01-08 DIAGNOSIS — Z1211 Encounter for screening for malignant neoplasm of colon: Secondary | ICD-10-CM | POA: Diagnosis not present

## 2017-01-08 DIAGNOSIS — K635 Polyp of colon: Secondary | ICD-10-CM | POA: Diagnosis not present

## 2017-01-08 MED ORDER — ESOMEPRAZOLE MAGNESIUM 40 MG PO CPDR: 40.0000 mg | DELAYED_RELEASE_CAPSULE | Freq: Every day | ORAL | 1 refills | Status: DC

## 2017-01-12 DIAGNOSIS — D225 Melanocytic nevi of trunk: Secondary | ICD-10-CM | POA: Diagnosis not present

## 2017-01-12 DIAGNOSIS — L820 Inflamed seborrheic keratosis: Secondary | ICD-10-CM | POA: Diagnosis not present

## 2017-01-12 DIAGNOSIS — L821 Other seborrheic keratosis: Secondary | ICD-10-CM | POA: Diagnosis not present

## 2017-01-12 DIAGNOSIS — L932 Other local lupus erythematosus: Secondary | ICD-10-CM | POA: Diagnosis not present

## 2017-01-12 DIAGNOSIS — L570 Actinic keratosis: Secondary | ICD-10-CM | POA: Diagnosis not present

## 2017-01-13 DIAGNOSIS — M9901 Segmental and somatic dysfunction of cervical region: Secondary | ICD-10-CM | POA: Diagnosis not present

## 2017-01-13 DIAGNOSIS — M5430 Sciatica, unspecified side: Secondary | ICD-10-CM | POA: Diagnosis not present

## 2017-01-13 DIAGNOSIS — M9903 Segmental and somatic dysfunction of lumbar region: Secondary | ICD-10-CM | POA: Diagnosis not present

## 2017-01-13 DIAGNOSIS — K635 Polyp of colon: Secondary | ICD-10-CM | POA: Diagnosis not present

## 2017-01-13 DIAGNOSIS — M9904 Segmental and somatic dysfunction of sacral region: Secondary | ICD-10-CM | POA: Diagnosis not present

## 2017-01-27 DIAGNOSIS — M9903 Segmental and somatic dysfunction of lumbar region: Secondary | ICD-10-CM | POA: Diagnosis not present

## 2017-01-27 DIAGNOSIS — M9904 Segmental and somatic dysfunction of sacral region: Secondary | ICD-10-CM | POA: Diagnosis not present

## 2017-01-27 DIAGNOSIS — M9901 Segmental and somatic dysfunction of cervical region: Secondary | ICD-10-CM | POA: Diagnosis not present

## 2017-01-27 DIAGNOSIS — M5430 Sciatica, unspecified side: Secondary | ICD-10-CM | POA: Diagnosis not present

## 2017-02-10 DIAGNOSIS — M9904 Segmental and somatic dysfunction of sacral region: Secondary | ICD-10-CM | POA: Diagnosis not present

## 2017-02-10 DIAGNOSIS — M5430 Sciatica, unspecified side: Secondary | ICD-10-CM | POA: Diagnosis not present

## 2017-02-10 DIAGNOSIS — M9903 Segmental and somatic dysfunction of lumbar region: Secondary | ICD-10-CM | POA: Diagnosis not present

## 2017-02-10 DIAGNOSIS — M9901 Segmental and somatic dysfunction of cervical region: Secondary | ICD-10-CM | POA: Diagnosis not present

## 2017-02-24 DIAGNOSIS — M9903 Segmental and somatic dysfunction of lumbar region: Secondary | ICD-10-CM | POA: Diagnosis not present

## 2017-02-24 DIAGNOSIS — M5430 Sciatica, unspecified side: Secondary | ICD-10-CM | POA: Diagnosis not present

## 2017-02-24 DIAGNOSIS — M9901 Segmental and somatic dysfunction of cervical region: Secondary | ICD-10-CM | POA: Diagnosis not present

## 2017-02-24 DIAGNOSIS — M9904 Segmental and somatic dysfunction of sacral region: Secondary | ICD-10-CM | POA: Diagnosis not present

## 2017-02-26 DIAGNOSIS — M65312 Trigger thumb, left thumb: Secondary | ICD-10-CM | POA: Diagnosis not present

## 2017-02-26 DIAGNOSIS — M79642 Pain in left hand: Secondary | ICD-10-CM | POA: Diagnosis not present

## 2017-03-10 ENCOUNTER — Encounter: Payer: Self-pay | Admitting: *Deleted

## 2017-03-10 DIAGNOSIS — M9901 Segmental and somatic dysfunction of cervical region: Secondary | ICD-10-CM | POA: Diagnosis not present

## 2017-03-10 DIAGNOSIS — M9903 Segmental and somatic dysfunction of lumbar region: Secondary | ICD-10-CM | POA: Diagnosis not present

## 2017-03-10 DIAGNOSIS — M5430 Sciatica, unspecified side: Secondary | ICD-10-CM | POA: Diagnosis not present

## 2017-03-10 DIAGNOSIS — M9904 Segmental and somatic dysfunction of sacral region: Secondary | ICD-10-CM | POA: Diagnosis not present

## 2017-03-11 ENCOUNTER — Encounter: Payer: Self-pay | Admitting: Cardiology

## 2017-03-11 ENCOUNTER — Ambulatory Visit (INDEPENDENT_AMBULATORY_CARE_PROVIDER_SITE_OTHER): Payer: Medicare Other | Admitting: Cardiology

## 2017-03-11 VITALS — BP 140/60 | HR 48 | Ht 61.0 in | Wt 165.0 lb

## 2017-03-11 DIAGNOSIS — I2510 Atherosclerotic heart disease of native coronary artery without angina pectoris: Secondary | ICD-10-CM

## 2017-03-11 DIAGNOSIS — I10 Essential (primary) hypertension: Secondary | ICD-10-CM

## 2017-03-11 DIAGNOSIS — E7800 Pure hypercholesterolemia, unspecified: Secondary | ICD-10-CM | POA: Diagnosis not present

## 2017-03-11 DIAGNOSIS — R002 Palpitations: Secondary | ICD-10-CM | POA: Diagnosis not present

## 2017-03-11 NOTE — Patient Instructions (Signed)
Your physician wants you to follow-up in: ONE YEAR WITH DR CRENSHAW You will receive a reminder letter in the mail two months in advance. If you don't receive a letter, please call our office to schedule the follow-up appointment.   If you need a refill on your cardiac medications before your next appointment, please call your pharmacy.  

## 2017-03-11 NOTE — Progress Notes (Signed)
HPI: FU palpitations and hypertension. Seen in the past secondary to hypertension, hyperlipidemia, and palpitations felt secondary to PVCs. She had a syncopal episode in April of 2010 that we felt was most likely vagal. An echocardiogram showed normal LV function. A Myoview showed an ejection fraction of 80% and normal perfusion. These were performed on February 20, 2009. Chest CT 4/15 showed nodule followed by primary care; also coronary calcification. Since I last saw her, the patient denies any dyspnea on exertion, orthopnea, PND, pedal edema, palpitations, syncope or chest pain.   Current Outpatient Prescriptions  Medication Sig Dispense Refill  . ALPRAZolam (XANAX) 0.25 MG tablet Take 0.25 mg by mouth at bedtime as needed for anxiety.     Marland Kitchen aspirin EC 81 MG tablet Take 1 tablet (81 mg total) by mouth daily. 90 tablet 3  . atorvastatin (LIPITOR) 80 MG tablet TAKE AS DIRECTED 90 tablet 0  . Calcium Carb-Cholecalciferol (CALTRATE 600+D) 600-800 MG-UNIT TABS Take 1 tablet by mouth every evening.     . cholecalciferol (VITAMIN D) 1000 UNITS tablet Take 2,000 Units by mouth daily.    . clobetasol (TEMOVATE) 0.05 % external solution Apply 1 application topically 2 (two) times daily.    Marland Kitchen desonide (DESOWEN) 0.05 % cream Apply 1 application topically 2 (two) times daily as needed (for rash).     Marland Kitchen escitalopram (LEXAPRO) 20 MG tablet Take 0.5 tablets (10 mg total) by mouth daily. 45 tablet 1  . esomeprazole (NEXIUM) 40 MG capsule Take 1 capsule (40 mg total) by mouth daily. 90 capsule 1  . estradiol (ESTRACE VAGINAL) 0.1 MG/GM vaginal cream Place 2 g vaginally See admin instructions. Place 2 g vaginally 2-3 times weekly. 42.5 g 3  . Halcinonide (HALOG) 0.1 % CREA Apply 1 application topically 2 (two) times daily.    Marland Kitchen lisinopril (PRINIVIL,ZESTRIL) 30 MG tablet TAKE 1 TABLET (30 MG TOTAL) BY MOUTH DAILY. 90 tablet 1  . metoprolol tartrate (LOPRESSOR) 25 MG tablet Take 1 tablet (25 mg total) by mouth  2 (two) times daily. 180 tablet 1  . Multiple Vitamin (MULTIVITAMIN) tablet Take 1 tablet by mouth every morning.     Marland Kitchen omega-3 acid ethyl esters (LOVAZA) 1 g capsule TAKE 2 CAPSULES BY MOUTH TWICE A DAY 360 capsule 1  . pioglitazone (ACTOS) 30 MG tablet TAKE 1 TABLET (30 MG TOTAL) BY MOUTH DAILY. 90 tablet 1   No current facility-administered medications for this visit.      Past Medical History:  Diagnosis Date  . Anxiety   . Arthritis   . Bronchitis   . Cataract   . Colovaginal fistula   . Depression   . Diabetes mellitus without complication ([place])   . Dysrhythmia    palpitations, occasional PVCs; Dr Stanford Breed cardiologist  . Esophageal stricture   . Fractured elbow 1970's  . GERD (gastroesophageal reflux disease)   . H/O hiatal hernia   . Hyperlipidemia   . Hypertension   . Perimenopausal vasomotor symptoms   . Postmenopausal HRT (hormone replacement therapy)   . Pre-diabetes   . Stress   . Subacute lupus erythematosus    Dr. Sol Blazing      Past Surgical History:  Procedure Laterality Date  . abdominal bypass  1987   fibroids   . ABDOMINAL HYSTERECTOMY     partial  . bilateral tubal ligtation  1972  . CHOLECYSTECTOMY N/A 03/21/2014   Procedure: LAPAROSCOPIC CHOLECYSTECTOMY WITH INTRAOPERATIVE CHOLANGIOGRAM;  Surgeon: Shann Medal, MD;  Location: [facility] OR;  Service: General;  Laterality: N/A;  . COLONOSCOPY    . elbow Right 1976   surgery after fracture of elbow  . TONSILLECTOMY    . TUBAL LIGATION      Social History   Social History  . Marital status: Married    Spouse name: Milbert Coulter  . Number of children: N/A  . Years of education: N/A   Occupational History  . Not on file.   Social History Main Topics  . Smoking status: Never Smoker  . Smokeless tobacco: Never Used  . Alcohol use No  . Drug use: No  . Sexual activity: No   Other Topics Concern  . Not on file   Social History Narrative  . No narrative on file    Family History  Problem Relation  Age of Onset  . Cancer Mother        laryngeal   . Hypertension Mother   . Heart disease Mother   . Hypertension Father   . Coronary artery disease Father   . Kidney disease Father   . Breast cancer Sister   . Hypertension Sister   . Cancer Sister        breast  . Heart attack Sister   . Heart disease Sister   . Stroke Sister   . Stroke Sister   . Hypertension Brother   . Cancer Brother        liver  . Hypertension Sister   . Hyperlipidemia Sister   . Hypertension Brother     ROS: no fevers or chills, productive cough, hemoptysis, dysphasia, odynophagia, melena, hematochezia, dysuria, hematuria, rash, seizure activity, orthopnea, PND, pedal edema, claudication. Remaining systems are negative.  Physical Exam: Well-developed obese in no acute distress.  Skin is warm and dry.  HEENT is normal.  Neck is supple.  Chest is clear to auscultation with normal expansion.  Cardiovascular exam is regular rate and rhythm.  Abdominal exam nontender or distended. No masses palpated. Extremities show no edema. neuro grossly intact  ECG- Sinus bradycardia, no ST changes; personally reviewed  A/P  1 coronary artery disease-Based on previous calcium on CT scan. She is not having chest pain. Continue aspirin and statin.  2 palpitations-symptoms are reasonably well controlled. Her heart rate is slow but she is not having symptoms. I'm hesitant to decrease metoprolol unless her heart rate decreases further as this may worsen her palpitations.  3 hypertension-blood pressure is controlled. Continue present medications.  4 hyperlipidemia-continue statin. Lipids and liver monitored by primary care.  Kirk Ruths, MD

## 2017-03-24 DIAGNOSIS — M9904 Segmental and somatic dysfunction of sacral region: Secondary | ICD-10-CM | POA: Diagnosis not present

## 2017-03-24 DIAGNOSIS — M9903 Segmental and somatic dysfunction of lumbar region: Secondary | ICD-10-CM | POA: Diagnosis not present

## 2017-03-24 DIAGNOSIS — M9901 Segmental and somatic dysfunction of cervical region: Secondary | ICD-10-CM | POA: Diagnosis not present

## 2017-03-24 DIAGNOSIS — M5430 Sciatica, unspecified side: Secondary | ICD-10-CM | POA: Diagnosis not present

## 2017-03-31 DIAGNOSIS — L080 Pyoderma: Secondary | ICD-10-CM | POA: Diagnosis not present

## 2017-04-07 DIAGNOSIS — M9903 Segmental and somatic dysfunction of lumbar region: Secondary | ICD-10-CM | POA: Diagnosis not present

## 2017-04-07 DIAGNOSIS — M5430 Sciatica, unspecified side: Secondary | ICD-10-CM | POA: Diagnosis not present

## 2017-04-07 DIAGNOSIS — M9901 Segmental and somatic dysfunction of cervical region: Secondary | ICD-10-CM | POA: Diagnosis not present

## 2017-04-07 DIAGNOSIS — M9904 Segmental and somatic dysfunction of sacral region: Secondary | ICD-10-CM | POA: Diagnosis not present

## 2017-04-13 ENCOUNTER — Ambulatory Visit: Payer: Medicare Other | Admitting: Family Medicine

## 2017-04-13 ENCOUNTER — Other Ambulatory Visit: Payer: Medicare Other

## 2017-04-13 DIAGNOSIS — I10 Essential (primary) hypertension: Secondary | ICD-10-CM | POA: Diagnosis not present

## 2017-04-13 DIAGNOSIS — E559 Vitamin D deficiency, unspecified: Secondary | ICD-10-CM

## 2017-04-13 DIAGNOSIS — E784 Other hyperlipidemia: Secondary | ICD-10-CM | POA: Diagnosis not present

## 2017-04-13 DIAGNOSIS — R7303 Prediabetes: Secondary | ICD-10-CM

## 2017-04-13 DIAGNOSIS — E7849 Other hyperlipidemia: Secondary | ICD-10-CM

## 2017-04-14 ENCOUNTER — Encounter: Payer: Self-pay | Admitting: *Deleted

## 2017-04-14 LAB — CBC WITH DIFFERENTIAL/PLATELET
BASOS: 0 %
Basophils Absolute: 0 10*3/uL (ref 0.0–0.2)
EOS (ABSOLUTE): 0.2 10*3/uL (ref 0.0–0.4)
Eos: 4 %
HEMOGLOBIN: 13.1 g/dL (ref 11.1–15.9)
Hematocrit: 39.7 % (ref 34.0–46.6)
IMMATURE GRANS (ABS): 0 10*3/uL (ref 0.0–0.1)
IMMATURE GRANULOCYTES: 0 %
LYMPHS: 34 %
Lymphocytes Absolute: 1.9 10*3/uL (ref 0.7–3.1)
MCH: 30.5 pg (ref 26.6–33.0)
MCHC: 33 g/dL (ref 31.5–35.7)
MCV: 93 fL (ref 79–97)
Monocytes Absolute: 0.6 10*3/uL (ref 0.1–0.9)
Monocytes: 10 %
NEUTROS ABS: 2.9 10*3/uL (ref 1.4–7.0)
NEUTROS PCT: 52 %
PLATELETS: 237 10*3/uL (ref 150–379)
RBC: 4.29 x10E6/uL (ref 3.77–5.28)
RDW: 13.9 % (ref 12.3–15.4)
WBC: 5.6 10*3/uL (ref 3.4–10.8)

## 2017-04-14 LAB — HEPATIC FUNCTION PANEL
ALBUMIN: 3.9 g/dL (ref 3.5–4.8)
ALK PHOS: 66 IU/L (ref 39–117)
ALT: 36 IU/L — ABNORMAL HIGH (ref 0–32)
AST: 52 IU/L — AB (ref 0–40)
BILIRUBIN TOTAL: 0.5 mg/dL (ref 0.0–1.2)
Bilirubin, Direct: 0.09 mg/dL (ref 0.00–0.40)
TOTAL PROTEIN: 6.6 g/dL (ref 6.0–8.5)

## 2017-04-14 LAB — LIPID PANEL
CHOLESTEROL TOTAL: 156 mg/dL (ref 100–199)
Chol/HDL Ratio: 3.2 ratio (ref 0.0–4.4)
HDL: 49 mg/dL (ref >39)
LDL Calculated: 81 mg/dL (ref 0–99)
TRIGLYCERIDES: 132 mg/dL (ref 0–149)
VLDL CHOLESTEROL CAL: 26 mg/dL (ref 5–40)

## 2017-04-14 LAB — BMP8+EGFR
BUN/Creatinine Ratio: 18 (ref 12–28)
BUN: 14 mg/dL (ref 8–27)
CALCIUM: 9.4 mg/dL (ref 8.7–10.3)
CO2: 23 mmol/L (ref 20–29)
CREATININE: 0.79 mg/dL (ref 0.57–1.00)
Chloride: 101 mmol/L (ref 96–106)
GFR, EST AFRICAN AMERICAN: 83 mL/min/{1.73_m2} (ref >59)
GFR, EST NON AFRICAN AMERICAN: 72 mL/min/{1.73_m2} (ref >59)
Glucose: 95 mg/dL (ref 65–99)
Potassium: 4.9 mmol/L (ref 3.5–5.2)
Sodium: 140 mmol/L (ref 134–144)

## 2017-04-14 LAB — VITAMIN D 25 HYDROXY (VIT D DEFICIENCY, FRACTURES): Vit D, 25-Hydroxy: 52.2 ng/mL (ref 30.0–100.0)

## 2017-04-21 DIAGNOSIS — L309 Dermatitis, unspecified: Secondary | ICD-10-CM | POA: Diagnosis not present

## 2017-04-22 ENCOUNTER — Ambulatory Visit (INDEPENDENT_AMBULATORY_CARE_PROVIDER_SITE_OTHER): Payer: Medicare Other | Admitting: Family Medicine

## 2017-04-22 ENCOUNTER — Ambulatory Visit (INDEPENDENT_AMBULATORY_CARE_PROVIDER_SITE_OTHER): Payer: Medicare Other

## 2017-04-22 ENCOUNTER — Encounter: Payer: Self-pay | Admitting: Family Medicine

## 2017-04-22 VITALS — BP 141/54 | HR 55 | Temp 97.1°F | Ht 61.0 in | Wt 166.0 lb

## 2017-04-22 DIAGNOSIS — E7849 Other hyperlipidemia: Secondary | ICD-10-CM

## 2017-04-22 DIAGNOSIS — I2510 Atherosclerotic heart disease of native coronary artery without angina pectoris: Secondary | ICD-10-CM | POA: Diagnosis not present

## 2017-04-22 DIAGNOSIS — R7303 Prediabetes: Secondary | ICD-10-CM | POA: Diagnosis not present

## 2017-04-22 DIAGNOSIS — I10 Essential (primary) hypertension: Secondary | ICD-10-CM | POA: Diagnosis not present

## 2017-04-22 DIAGNOSIS — E784 Other hyperlipidemia: Secondary | ICD-10-CM

## 2017-04-22 DIAGNOSIS — M9901 Segmental and somatic dysfunction of cervical region: Secondary | ICD-10-CM | POA: Diagnosis not present

## 2017-04-22 DIAGNOSIS — E559 Vitamin D deficiency, unspecified: Secondary | ICD-10-CM

## 2017-04-22 DIAGNOSIS — M5430 Sciatica, unspecified side: Secondary | ICD-10-CM | POA: Diagnosis not present

## 2017-04-22 DIAGNOSIS — M9904 Segmental and somatic dysfunction of sacral region: Secondary | ICD-10-CM | POA: Diagnosis not present

## 2017-04-22 DIAGNOSIS — M9903 Segmental and somatic dysfunction of lumbar region: Secondary | ICD-10-CM | POA: Diagnosis not present

## 2017-04-22 MED ORDER — ALPRAZOLAM 0.25 MG PO TABS: 0.2500 mg | ORAL_TABLET | Freq: Every evening | ORAL | 1 refills | Status: DC | PRN

## 2017-04-22 NOTE — Patient Instructions (Addendum)
Medicare Annual Wellness Visit  Misty Smith and the medical providers at [facility] strive to bring you the best medical care.  In doing so we not only want to address your current medical conditions and concerns but also to detect new conditions early and prevent illness, disease and health-related problems.    Medicare offers a yearly Wellness Visit which allows our clinical staff to assess your need for preventative services including immunizations, lifestyle education, counseling to decrease risk of preventable diseases and screening for fall risk and other medical concerns.    This visit is provided free of charge (no copay) for all Medicare recipients. The clinical pharmacists at [facility] have begun to conduct these Wellness Visits which will also include a thorough review of all your medications.    As you primary medical provider recommend that you make an appointment for your Annual Wellness Visit if you have not done so already this year.  You may set up this appointment before you leave today or you may call back ([phone]) and schedule an appointment.  Please make sure when you call that you mention that you are scheduling your Annual Wellness Visit with the clinical pharmacist so that the appointment may be made for the proper length of time.     Continue current medications. Continue good therapeutic lifestyle changes which include good diet and exercise. Fall precautions discussed with patient. If an FOBT was given today- please return it to our front desk. If you are over 65 years old - you may need Prevnar 33 or the adult Pneumonia vaccine.  **Flu shots are available--- please call and schedule a FLU-CLINIC appointment**  After your visit with [place] today you will receive a survey in the mail or online from [employer] regarding your care with [place]. Please take a moment to fill this out. Your feedback is very  important to [place] as you can help [place] better understand your patient needs as well as improve your experience and satisfaction. WE CARE ABOUT YOU!!!   Follow-up with cardiology as planned Get eye exam as planned in December We will call with the results of the chest x-ray as soon as those results become available For about 1 month try Zantac, the equate brand 150 mg 1 daily before dinner every night and then as needed Please come back to the office in about 4 weeks for repeat liver function test because of the liver function test elevation Watch sodium intake and try to increase walking activity Keep appointment with orthopedist as planned regarding shoulder pain

## 2017-04-22 NOTE — Progress Notes (Signed)
Subjective:    Patient ID: Misty Smith, female    DOB: 06-04-1941, 76 y.o.   MRN: [id number]  HPI Pt here for follow up and management of chronic medical problems which includes hyperlipidemia and hyperlipidemia. She is taking medication regularly.The patient is doing well today and seems to be in good spirits. She has had blood work done and this will be reviewed with her during the visit. All of her cholesterol numbers with traditional lipid panel including the triglycerides were excellent and I'm real pleased to see this. Her blood sugar was good at 95. The creatinine and electrolytes were all normal including potassium. The CBC had a normal white blood cell count with a good and stable hemoglobin at 13.1 and an adequate platelet count. The vitamin D level was good at 52.2 and she should continue current treatment 2 liver function tests were slightly elevated and one has been elevated slightly in the past and we will continue to monitor this. The patient also had a CT of the chest and April 2016. At that particular point in time there was a 29 mm right peritracheal nodule that is likely thyroidal and that 2 year stability is compatible with a benign process. She still had continued stability of multiple small pulmonary nodules. The patient does complain of some arthritis today. She denies any chest pain or shortness of breath. She sees the cardiologist yearly and just saw him this summer and he will see her again next summer. It is a little bit of an inconvenience and she may switch to the local cardiologist but we'll await until sometime in the future to make that decision. She denies any trouble with her stomach other than a little bit more heartburn than usual even though she is taking a generic Nexium once a day. She says it may be related to not watching her diet as closely as she should. She denies any blood in the stool or black tarry bowel movements and did have a colonoscopy in May of this  year and was told that she did not need any more colonoscopies. She is passing her water well other than just having frequency. She is requesting a refill on the Xanax. She hopes to get out and get more walking in because she just has a lot of joint pain in multiple joints her knees and shoulders and lower back. She plans to see the orthopedic surgeon regarding the shoulder pain next week in this office. Her eye appointments are done regularly on a yearly basis and come again in December.    Patient Active Problem List   Diagnosis Date Noted  . Coronary artery disease due to lipid rich plaque 03/18/2016  . Anxiety state 03/18/2016  . Vitamin D deficiency 03/18/2016  . 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 CAD (coronary artery disease) 12/30/2013  . 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 Osteopenia of the elderly 10/26/2013  . Generalized anxiety disorder 09/21/2013  . GERD (gastroesophageal reflux disease) 09/21/2013  . Pulmonary nodule seen on imaging study 09/21/2013  . SYNCOPE 02/02/2009  . Pre-diabetes 02/01/2009  . Hyperlipidemia 02/01/2009  . Essential hypertension 02/01/2009  . DIZZINESS 02/01/2009  . PALPITATIONS 02/01/2009   Outpatient Encounter Prescriptions as of 04/22/2017  Medication Sig  . ALPRAZolam (XANAX) 0.25 MG tablet Take 0.25 mg by mouth at bedtime as needed for anxiety.   Marland Kitchen aspirin EC 81 MG tablet Take 1 tablet (81 mg total) by mouth daily.  Marland Kitchen atorvastatin (LIPITOR) 40 MG tablet  Take 40 mg by mouth daily.  . Calcium Carb-Cholecalciferol (CALTRATE 600+D) 600-800 MG-UNIT TABS Take 1 tablet by mouth every evening.   . cholecalciferol (VITAMIN D) 1000 UNITS tablet Take 2,000 Units by mouth daily.  . clobetasol (TEMOVATE) 0.05 % external solution Apply 1 application topically 2 (two) times daily.  Marland Kitchen desonide (DESOWEN) 0.05 % cream Apply 1 application topically 2 (two) times daily as needed (for rash).   Marland Kitchen escitalopram (LEXAPRO) 20 MG tablet Take 0.5 tablets (10 mg  total) by mouth daily.  Marland Kitchen esomeprazole (NEXIUM) 40 MG capsule Take 1 capsule (40 mg total) by mouth daily.  Marland Kitchen estradiol (ESTRACE VAGINAL) 0.1 MG/GM vaginal cream Place 2 g vaginally See admin instructions. Place 2 g vaginally 2-3 times weekly.  Marland Kitchen lisinopril (PRINIVIL,ZESTRIL) 30 MG tablet TAKE 1 TABLET (30 MG TOTAL) BY MOUTH DAILY.  . metoprolol tartrate (LOPRESSOR) 25 MG tablet Take 1 tablet (25 mg total) by mouth 2 (two) times daily.  . Multiple Vitamin (MULTIVITAMIN) tablet Take 1 tablet by mouth every morning.   Marland Kitchen omega-3 acid ethyl esters (LOVAZA) 1 g capsule TAKE 2 CAPSULES BY MOUTH TWICE A DAY  . pioglitazone (ACTOS) 30 MG tablet TAKE 1 TABLET (30 MG TOTAL) BY MOUTH DAILY.  . [DISCONTINUED] Halcinonide (HALOG) 0.1 % CREA Apply 1 application topically 2 (two) times daily.   No facility-administered encounter medications on file as of 04/22/2017.       Review of Systems  Constitutional: Negative.   HENT: Negative.   Eyes: Negative.   Respiratory: Negative.   Cardiovascular: Negative.   Gastrointestinal: Negative.   Endocrine: Negative.   Genitourinary: Negative.   Musculoskeletal: Positive for arthralgias.  Skin: Negative.   Allergic/Immunologic: Negative.   Neurological: Negative.   Hematological: Negative.   Psychiatric/Behavioral: Negative.        Objective:   Physical Exam  Constitutional: She is oriented to person, place, and time. She appears well-developed and well-nourished. No distress.  The patient is pleasant and relaxed  HENT:  Head: Normocephalic and atraumatic.  Right Ear: External ear normal.  Left Ear: External ear normal.  Nose: Nose normal.  Mouth/Throat: Oropharynx is clear and moist. No oropharyngeal exudate.  Eyes: Pupils are equal, round, and reactive to light. Conjunctivae and EOM are normal. Right eye exhibits no discharge. Left eye exhibits no discharge. No scleral icterus.  Neck: Normal range of motion. Neck supple. No thyromegaly present.  No  bruits thyromegaly or anterior cervical adenopathy  Cardiovascular: Normal rate, regular rhythm, normal heart sounds and intact distal pulses.   No murmur heard. The heart has a regular rate and rhythm at 60/m  Pulmonary/Chest: Effort normal and breath sounds normal. No respiratory distress. She has no wheezes. She has no rales.  Clear anteriorly and posteriorly  Abdominal: Soft. Bowel sounds are normal. She exhibits no mass. There is tenderness. There is no rebound and no guarding.  Slight epigastric and right and left upper quadrant tenderness. Slight suprapubic tenderness. No liver or spleen enlargement no masses and no bruits  Musculoskeletal: Normal range of motion. She exhibits no edema.  Hesitant range of motion with her back and right shoulder  Lymphadenopathy:    She has no cervical adenopathy.  Neurological: She is alert and oriented to person, place, and time. She has normal reflexes. No cranial nerve deficit.  Skin: Skin is warm and dry. No rash noted.  Psychiatric: She has a normal mood and affect. Her behavior is normal. Judgment and thought content normal.  Nursing note  and vitals reviewed.  BP (!) 141/54 (BP Location: Left Arm)   Pulse (!) 55   Temp (!) 97.1 F (36.2 C) (Oral)   Ht 5\' 1"  (1.549 m)   Wt 166 lb (75.3 kg)   BMI 31.37 kg/m         Assessment & Plan:  1. Other hyperlipidemia -Cholesterol numbers were excellent and she will continue with her current therapeutic regimen including exercise and med's. - DG Chest 2 View; Future  2. Essential hypertension -The blood pressure was repeated and I got 142/60. No change in treatment today. - DG Chest 2 View; Future  3. Vitamin D deficiency -Continue current treatment  4. Pre-diabetes -Continue current treatment and as aggressive therapeutic lifestyle changes as possible  No orders of the defined types were placed in this encounter.  Patient Instructions                       Medicare Annual Wellness  Visit  [facility] and the medical providers at [facility] strive to bring you the best medical care.  In doing so we not only want to address your current medical conditions and concerns but also to detect new conditions early and prevent illness, disease and health-related problems.    Medicare offers a yearly Wellness Visit which allows our clinical staff to assess your need for preventative services including immunizations, lifestyle education, counseling to decrease risk of preventable diseases and screening for fall risk and other medical concerns.    This visit is provided free of charge (no copay) for all Medicare recipients. The clinical pharmacists at [facility] have begun to conduct these Wellness Visits which will also include a thorough review of all your medications.    As you primary medical provider recommend that you make an appointment for your Annual Wellness Visit if you have not done so already this year.  You may set up this appointment before you leave today or you may call back ([phone]) and schedule an appointment.  Please make sure when you call that you mention that you are scheduling your Annual Wellness Visit with the clinical pharmacist so that the appointment may be made for the proper length of time.     Continue current medications. Continue good therapeutic lifestyle changes which include good diet and exercise. Fall precautions discussed with patient. If an FOBT was given today- please return it to our front desk. If you are over 22 years old - you may need Prevnar 97 or the adult Pneumonia vaccine.  **Flu shots are available--- please call and schedule a FLU-CLINIC appointment**  After your visit with [place] today you will receive a survey in the mail or online from [employer] regarding your care with [place]. Please take a moment to fill this out. Your feedback is very important to [place] as you can help [place] better understand  your patient needs as well as improve your experience and satisfaction. WE CARE ABOUT YOU!!!   Follow-up with cardiology as planned Get eye exam as planned in December We will call with the results of the chest x-ray as soon as those results become available For about 1 month try Zantac, the equate brand 150 mg 1 daily before dinner every night and then as needed Please come back to the office in about 4 weeks for repeat liver function test because of the liver function test elevation Watch sodium intake and try to increase walking activity Keep  appointment with orthopedist as planned regarding shoulder pain  Arrie Senate MD

## 2017-04-22 NOTE — Addendum Note (Signed)
Addended by: Zannie Cove on: 04/22/2017 04:32 PM   Modules accepted: Orders

## 2017-04-23 ENCOUNTER — Other Ambulatory Visit: Payer: Self-pay | Admitting: Family Medicine

## 2017-04-23 DIAGNOSIS — Z8639 Personal history of other endocrine, nutritional and metabolic disease: Secondary | ICD-10-CM

## 2017-04-27 ENCOUNTER — Ambulatory Visit (HOSPITAL_COMMUNITY): Payer: Medicare Other

## 2017-04-29 ENCOUNTER — Encounter: Payer: Self-pay | Admitting: *Deleted

## 2017-04-29 ENCOUNTER — Ambulatory Visit (INDEPENDENT_AMBULATORY_CARE_PROVIDER_SITE_OTHER): Payer: Medicare Other | Admitting: *Deleted

## 2017-04-29 VITALS — BP 135/65 | HR 50 | Ht 59.5 in | Wt 162.0 lb

## 2017-04-29 DIAGNOSIS — Z0000 Encounter for general adult medical examination without abnormal findings: Secondary | ICD-10-CM | POA: Diagnosis not present

## 2017-04-29 NOTE — Patient Instructions (Signed)
  Misty Smith , Thank you for taking time to come for your Medicare Wellness Visit. I appreciate your ongoing commitment to your health goals. Please review the following plan we discussed and let me know if I can assist you in the future.   These are the goals we discussed: Goals    . Exercise 3x per week (30 min per time)          Do chair exercises at home daily. Check into exercise programs at the recreation department or [employer].        This is a list of the screening recommended for you and due dates:  Health Maintenance  Topic Date Due  . Mammogram  05/27/2017*  . Flu Shot  06/22/2017*  . Pneumonia vaccines (2 of 2 - PPSV23) 07/05/2017*  . Pap Smear  12/04/2019*  . DEXA scan (bone density measurement)  10/13/2018  . Tetanus Vaccine  06/01/2021  *Topic was postponed. The date shown is not the original due date.

## 2017-04-29 NOTE — Progress Notes (Addendum)
Subjective:   Misty Smith is a 76 y.o. female who presents for a subsequent Medicare Annual Wellness Visit. Misty Smith lives at home with her husband. She has 3 daughters and one son and several grandchildren. Her husband has dementia and she cares for him. Her son lives down the street and all of her children help out when needed.   Review of Systems    Reports that her health is about the same as last year.   Cardiac Risk Factors include: advanced age (>56men, >61 women);hypertension;sedentary lifestyle;dyslipidemia  Musculoskeletal: right shoulder pain. Scheduled for joint injection with Dr Wynelle Link next week.   Urinary: Some mild stress incontinence  Other systems negative today.     Objective:    Today's Vitals   04/29/17 0945  BP: 135/65  Pulse: (!) 50  Weight: 162 lb (73.5 kg)  Height: 4' 11.5" (1.511 m)   Body mass index is 32.17 kg/m.   Current Medications (verified) Outpatient Encounter Prescriptions as of 04/29/2017  Medication Sig  . ALPRAZolam (XANAX) 0.25 MG tablet Take 1 tablet (0.25 mg total) by mouth at bedtime as needed for anxiety.  Marland Kitchen aspirin EC 81 MG tablet Take 1 tablet (81 mg total) by mouth daily.  Marland Kitchen atorvastatin (LIPITOR) 40 MG tablet Take 40 mg by mouth daily.  . Calcium Carb-Cholecalciferol (CALTRATE 600+D) 600-800 MG-UNIT TABS Take 1 tablet by mouth every evening.   . cholecalciferol (VITAMIN D) 1000 UNITS tablet Take 2,000 Units by mouth daily.  . clobetasol (TEMOVATE) 0.05 % external solution Apply 1 application topically 2 (two) times daily.  Marland Kitchen escitalopram (LEXAPRO) 20 MG tablet Take 0.5 tablets (10 mg total) by mouth daily.  Marland Kitchen esomeprazole (NEXIUM) 40 MG capsule Take 1 capsule (40 mg total) by mouth daily.  Marland Kitchen estradiol (ESTRACE VAGINAL) 0.1 MG/GM vaginal cream Place 2 g vaginally See admin instructions. Place 2 g vaginally 2-3 times weekly.  Marland Kitchen lisinopril (PRINIVIL,ZESTRIL) 30 MG tablet TAKE 1 TABLET (30 MG TOTAL) BY MOUTH DAILY.  .  metoprolol tartrate (LOPRESSOR) 25 MG tablet Take 1 tablet (25 mg total) by mouth 2 (two) times daily.  . Multiple Vitamin (MULTIVITAMIN) tablet Take 1 tablet by mouth every morning.   Marland Kitchen omega-3 acid ethyl esters (LOVAZA) 1 g capsule TAKE 2 CAPSULES BY MOUTH TWICE A DAY  . pioglitazone (ACTOS) 30 MG tablet TAKE 1 TABLET (30 MG TOTAL) BY MOUTH DAILY.  Marland Kitchen desonide (DESOWEN) 0.05 % cream Apply 1 application topically 2 (two) times daily as needed (for rash).    No facility-administered encounter medications on file as of 04/29/2017.     Allergies (verified) Ciprofloxacin; Codeine; Iohexol; Penicillins; Sulfonamide derivatives; Asa [aspirin]; Keflex [cephalexin]; and Morphine and related   History: Past Medical History:  Diagnosis Date  . Anxiety   . Arthritis   . Bronchitis   . Cataract   . Colovaginal fistula   . Depression   . Diabetes mellitus without complication ([place])   . Dysrhythmia    palpitations, occasional PVCs; Dr Stanford Breed cardiologist  . Esophageal stricture   . Fractured elbow 1970's  . GERD (gastroesophageal reflux disease)   . H/O hiatal hernia   . Hyperlipidemia   . Hypertension   . Perimenopausal vasomotor symptoms   . Postmenopausal HRT (hormone replacement therapy)   . Pre-diabetes   . Stress   . Subacute lupus erythematosus    Dr. Sol Blazing     Past Surgical History:  Procedure Laterality Date  . abdominal bypass  1987   fibroids   .  ABDOMINAL HYSTERECTOMY     partial  . bilateral tubal ligtation  1972  . CHOLECYSTECTOMY N/A 03/21/2014   Procedure: LAPAROSCOPIC CHOLECYSTECTOMY WITH INTRAOPERATIVE CHOLANGIOGRAM;  Surgeon: Shann Medal, MD;  Location: [facility];  Service: General;  Laterality: N/A;  . COLONOSCOPY    . elbow Right 1976   surgery after fracture of elbow  . TONSILLECTOMY    . TUBAL LIGATION     Family History  Problem Relation Age of Onset  . Cancer Mother        laryngeal   . Hypertension Mother   . Heart disease Mother   . Hypertension  Father   . Coronary artery disease Father   . Kidney disease Father   . Breast cancer Sister   . Hypertension Sister   . Cancer Sister        breast  . Heart attack Sister   . Heart disease Sister   . Stroke Sister   . Stroke Sister   . Hypertension Brother   . Cancer Brother        liver  . Hypertension Sister   . Hyperlipidemia Sister   . Hypertension Brother    Social History   Occupational History  . Not on file.   Social History Main Topics  . Smoking status: Never Smoker  . Smokeless tobacco: Never Used  . Alcohol use No  . Drug use: No  . Sexual activity: No    Tobacco Counseling No tobacco use  Activities of Daily Living In your present state of health, do you have any difficulty performing the following activities: 04/29/2017  Hearing? Y  Comment Some hearing loss in left ear  Vision? N  Difficulty concentrating or making decisions? N  Walking or climbing stairs? N  Comment Uses rails  Dressing or bathing? N  Doing errands, shopping? N  Preparing Food and eating ? N  Using the Toilet? N  In the past six months, have you accidently leaked urine? Y  Comment Has some stress incontinence with coughing or sneezing  Do you have problems with loss of bowel control? N  Managing your Medications? N  Comment Keeps them in original bottles  Managing your Finances? N  Housekeeping or managing your Housekeeping? N  Some recent data might be hidden    Immunizations and Health Maintenance Immunization History  Administered Date(s) Administered  . Influenza, High Dose Seasonal PF 06/22/2015, 05/26/2016  . Influenza,inj,Quad PF,6+ Mos 06/08/2013, 06/14/2014  . Pneumococcal Conjugate-13 09/21/2013   There are no preventive care reminders to display for this patient.  Patient Care Team: Chipper Herb, MD as PCP - General (Family Medicine) Clarene Essex, MD (Gastroenterology) Stanford Breed Denice Bors, MD (Cardiology) Irine Seal, MD (Urology) Paralee Cancel, MD  (Orthopedic Surgery) Clent Jacks, MD as Consulting Physician (Ophthalmology)  No ER visits, Surgeries, or hospitalizations this past year.     Assessment:   This is a routine wellness examination for [place].   Hearing/Vision screen No deficits noted during visit. Last eye exam was in Dec 2017 with Dr Katy Fitch. She will see him again in December.   Dietary issues and exercise activities discussed: Current Exercise Habits: Home exercise routine (Has not been able to walk as much often. Stays busy at home with housework. ), Type of exercise: walking, Time (Minutes): 30, Frequency (Times/Week): 2, Weekly Exercise (Minutes/Week): 60, Intensity: Mild, Exercise limited by: Other - see comments (Has to care for husband with dementia)   Diet: 3 meals a day  Goals    .  Exercise 3x per week (30 min per time)          Do chair exercises at home daily. Check into exercise programs at the recreation department or [employer].       Depression Screen PHQ 2/9 Scores 04/29/2017 12/03/2016 07/22/2016 04/22/2016 03/18/2016 12/10/2015 11/09/2015  PHQ - 2 Score 0 0 0 1 0 0 0    Fall Risk Fall Risk  04/29/2017 12/03/2016 07/22/2016 04/22/2016 03/18/2016  Falls in the past year? Yes Yes Yes No No  Number falls in past yr: 1 2 or more 2 or more - -  Comment - - - - -  Injury with Fall? No No No - -  Comment - - - - -  Risk for fall due to : - - - - -  Follow up Education provided - - - -    Cognitive Function: MMSE - Mini Mental State Exam 04/29/2017 04/22/2016  Orientation to time 5 5  Orientation to Place 5 5  Registration 3 3  Attention/ Calculation 5 5  Recall 3 3  Language- name 2 objects 2 2  Language- repeat 1 1  Language- follow 3 step command 3 3  Language- read & follow direction 1 1  Write a sentence 1 1  Copy design 1 1  Total score 30 30   Normal exam      Screening Tests Health Maintenance  Topic Date Due  . MAMMOGRAM  05/27/2017 (Originally 02/24/2017)  . INFLUENZA VACCINE  06/22/2017  (Originally 04/01/2017)  . PNA vac Low Risk Adult (2 of 2 - PPSV23) 07/05/2017 (Originally 09/21/2014)  . PAP SMEAR  12/04/2019 (Originally 11/13/2016)  . DEXA SCAN  10/13/2018  . TETANUS/TDAP  06/01/2021      Plan:   Keep f/u with Dr Laurance Flatten Urinate every 2 hours to help with stress incontinence. Let us know if referral to urology is wanted.  Chair exercises daily. Handout given. Encouraged exercise programs at [employer] or recreation department. Good for socialization too. Can take husband with her.  Bring copy of Advance Directives for [place] to put on file. Ask for help at home and try to avoid caregiver strain.    I have personally reviewed and noted the following in the patient's chart:   . Medical and social history . Use of alcohol, tobacco or illicit drugs  . Current medications and supplements . Functional ability and status . Nutritional status . Physical activity . Advanced directives . List of other physicians . Hospitalizations, surgeries, and ER visits in previous 12 months . Vitals . Screenings to include cognitive, depression, and falls . Referrals and appointments  In addition, I have reviewed and discussed with patient certain preventive protocols, quality metrics, and best practice recommendations. A written personalized care plan for preventive services as well as general preventive health recommendations were provided to patient.     Chong Sicilian, RN  04/29/2017   I have reviewed and agree with the above AWV documentation.   Arrie Senate MD

## 2017-04-30 ENCOUNTER — Other Ambulatory Visit: Payer: Medicare Other

## 2017-04-30 DIAGNOSIS — M7541 Impingement syndrome of right shoulder: Secondary | ICD-10-CM | POA: Diagnosis not present

## 2017-05-01 ENCOUNTER — Ambulatory Visit
Admission: RE | Admit: 2017-05-01 | Discharge: 2017-05-01 | Disposition: A | Discharge status: Home / Self Care | Payer: Medicare Other | Source: Ambulatory Visit | Attending: Family Medicine | Admitting: Family Medicine

## 2017-05-01 DIAGNOSIS — E041 Nontoxic single thyroid nodule: Secondary | ICD-10-CM | POA: Diagnosis not present

## 2017-05-01 DIAGNOSIS — Z8639 Personal history of other endocrine, nutritional and metabolic disease: Secondary | ICD-10-CM

## 2017-05-06 DIAGNOSIS — M5430 Sciatica, unspecified side: Secondary | ICD-10-CM | POA: Diagnosis not present

## 2017-05-06 DIAGNOSIS — M9903 Segmental and somatic dysfunction of lumbar region: Secondary | ICD-10-CM | POA: Diagnosis not present

## 2017-05-06 DIAGNOSIS — M9901 Segmental and somatic dysfunction of cervical region: Secondary | ICD-10-CM | POA: Diagnosis not present

## 2017-05-06 DIAGNOSIS — M9904 Segmental and somatic dysfunction of sacral region: Secondary | ICD-10-CM | POA: Diagnosis not present

## 2017-05-19 DIAGNOSIS — M9903 Segmental and somatic dysfunction of lumbar region: Secondary | ICD-10-CM | POA: Diagnosis not present

## 2017-05-19 DIAGNOSIS — M9901 Segmental and somatic dysfunction of cervical region: Secondary | ICD-10-CM | POA: Diagnosis not present

## 2017-05-19 DIAGNOSIS — M9904 Segmental and somatic dysfunction of sacral region: Secondary | ICD-10-CM | POA: Diagnosis not present

## 2017-05-19 DIAGNOSIS — M5430 Sciatica, unspecified side: Secondary | ICD-10-CM | POA: Diagnosis not present

## 2017-05-27 ENCOUNTER — Other Ambulatory Visit: Payer: Self-pay | Admitting: Family Medicine

## 2017-06-02 ENCOUNTER — Other Ambulatory Visit: Payer: Self-pay | Admitting: Family Medicine

## 2017-06-03 DIAGNOSIS — M9904 Segmental and somatic dysfunction of sacral region: Secondary | ICD-10-CM | POA: Diagnosis not present

## 2017-06-03 DIAGNOSIS — M9903 Segmental and somatic dysfunction of lumbar region: Secondary | ICD-10-CM | POA: Diagnosis not present

## 2017-06-03 DIAGNOSIS — M5430 Sciatica, unspecified side: Secondary | ICD-10-CM | POA: Diagnosis not present

## 2017-06-03 DIAGNOSIS — M9901 Segmental and somatic dysfunction of cervical region: Secondary | ICD-10-CM | POA: Diagnosis not present

## 2017-06-04 ENCOUNTER — Ambulatory Visit (INDEPENDENT_AMBULATORY_CARE_PROVIDER_SITE_OTHER): Payer: Medicare Other

## 2017-06-04 ENCOUNTER — Other Ambulatory Visit: Payer: Self-pay | Admitting: Family Medicine

## 2017-06-04 DIAGNOSIS — Z23 Encounter for immunization: Secondary | ICD-10-CM

## 2017-06-17 DIAGNOSIS — M9901 Segmental and somatic dysfunction of cervical region: Secondary | ICD-10-CM | POA: Diagnosis not present

## 2017-06-17 DIAGNOSIS — M9904 Segmental and somatic dysfunction of sacral region: Secondary | ICD-10-CM | POA: Diagnosis not present

## 2017-06-17 DIAGNOSIS — M5430 Sciatica, unspecified side: Secondary | ICD-10-CM | POA: Diagnosis not present

## 2017-06-17 DIAGNOSIS — M9903 Segmental and somatic dysfunction of lumbar region: Secondary | ICD-10-CM | POA: Diagnosis not present

## 2017-06-23 ENCOUNTER — Other Ambulatory Visit: Payer: Self-pay | Admitting: Family Medicine

## 2017-06-25 DIAGNOSIS — Z1231 Encounter for screening mammogram for malignant neoplasm of breast: Secondary | ICD-10-CM | POA: Diagnosis not present

## 2017-07-01 ENCOUNTER — Other Ambulatory Visit: Payer: Self-pay | Admitting: Family Medicine

## 2017-08-16 ENCOUNTER — Other Ambulatory Visit: Payer: Self-pay | Admitting: Family Medicine

## 2017-08-17 NOTE — Telephone Encounter (Signed)
OV 09/2017

## 2017-08-31 ENCOUNTER — Other Ambulatory Visit: Payer: Self-pay | Admitting: Family Medicine

## 2017-09-04 ENCOUNTER — Other Ambulatory Visit: Payer: Self-pay | Admitting: Family Medicine

## 2017-09-09 ENCOUNTER — Ambulatory Visit: Payer: Medicare Other | Admitting: Family Medicine

## 2017-09-10 DIAGNOSIS — D3131 Benign neoplasm of right choroid: Secondary | ICD-10-CM | POA: Diagnosis not present

## 2017-09-10 DIAGNOSIS — D3132 Benign neoplasm of left choroid: Secondary | ICD-10-CM | POA: Diagnosis not present

## 2017-09-10 DIAGNOSIS — H40013 Open angle with borderline findings, low risk, bilateral: Secondary | ICD-10-CM | POA: Diagnosis not present

## 2017-09-10 DIAGNOSIS — H25813 Combined forms of age-related cataract, bilateral: Secondary | ICD-10-CM | POA: Diagnosis not present

## 2017-09-10 DIAGNOSIS — E119 Type 2 diabetes mellitus without complications: Secondary | ICD-10-CM | POA: Diagnosis not present

## 2017-09-10 DIAGNOSIS — H04123 Dry eye syndrome of bilateral lacrimal glands: Secondary | ICD-10-CM | POA: Diagnosis not present

## 2017-09-11 ENCOUNTER — Other Ambulatory Visit: Payer: Medicare Other

## 2017-09-11 DIAGNOSIS — E7849 Other hyperlipidemia: Secondary | ICD-10-CM | POA: Diagnosis not present

## 2017-09-11 DIAGNOSIS — E559 Vitamin D deficiency, unspecified: Secondary | ICD-10-CM | POA: Diagnosis not present

## 2017-09-11 DIAGNOSIS — I10 Essential (primary) hypertension: Secondary | ICD-10-CM

## 2017-09-11 DIAGNOSIS — R7303 Prediabetes: Secondary | ICD-10-CM | POA: Diagnosis not present

## 2017-09-12 LAB — CBC WITH DIFFERENTIAL/PLATELET
Basophils Absolute: 0 10*3/uL (ref 0.0–0.2)
Basos: 0 %
EOS (ABSOLUTE): 0.1 10*3/uL (ref 0.0–0.4)
Eos: 2 %
Hematocrit: 40.5 % (ref 34.0–46.6)
Hemoglobin: 13.5 g/dL (ref 11.1–15.9)
IMMATURE GRANS (ABS): 0 10*3/uL (ref 0.0–0.1)
IMMATURE GRANULOCYTES: 0 %
LYMPHS: 32 %
Lymphocytes Absolute: 1.9 10*3/uL (ref 0.7–3.1)
MCH: 31.5 pg (ref 26.6–33.0)
MCHC: 33.3 g/dL (ref 31.5–35.7)
MCV: 95 fL (ref 79–97)
MONOS ABS: 0.6 10*3/uL (ref 0.1–0.9)
Monocytes: 10 %
NEUTROS ABS: 3.4 10*3/uL (ref 1.4–7.0)
Neutrophils: 56 %
PLATELETS: 239 10*3/uL (ref 150–379)
RBC: 4.28 x10E6/uL (ref 3.77–5.28)
RDW: 14.1 % (ref 12.3–15.4)
WBC: 6.1 10*3/uL (ref 3.4–10.8)

## 2017-09-12 LAB — BMP8+EGFR
BUN/Creatinine Ratio: 16 (ref 12–28)
BUN: 10 mg/dL (ref 8–27)
CALCIUM: 10 mg/dL (ref 8.7–10.3)
CO2: 26 mmol/L (ref 20–29)
Chloride: 99 mmol/L (ref 96–106)
Creatinine, Ser: 0.62 mg/dL (ref 0.57–1.00)
GFR calc Af Amer: 100 mL/min/{1.73_m2} (ref >59)
GFR calc non Af Amer: 87 mL/min/{1.73_m2} (ref >59)
Glucose: 97 mg/dL (ref 65–99)
POTASSIUM: 5.5 mmol/L — AB (ref 3.5–5.2)
Sodium: 140 mmol/L (ref 134–144)

## 2017-09-12 LAB — HEPATIC FUNCTION PANEL
ALBUMIN: 4.3 g/dL (ref 3.5–4.8)
ALT: 31 IU/L (ref 0–32)
AST: 37 IU/L (ref 0–40)
Alkaline Phosphatase: 69 IU/L (ref 39–117)
Bilirubin Total: 0.3 mg/dL (ref 0.0–1.2)
Bilirubin, Direct: 0.12 mg/dL (ref 0.00–0.40)
Total Protein: 7 g/dL (ref 6.0–8.5)

## 2017-09-12 LAB — LIPID PANEL
CHOLESTEROL TOTAL: 142 mg/dL (ref 100–199)
Chol/HDL Ratio: 2.5 ratio (ref 0.0–4.4)
HDL: 56 mg/dL (ref >39)
LDL Calculated: 60 mg/dL (ref 0–99)
Triglycerides: 129 mg/dL (ref 0–149)
VLDL CHOLESTEROL CAL: 26 mg/dL (ref 5–40)

## 2017-09-12 LAB — VITAMIN D 25 HYDROXY (VIT D DEFICIENCY, FRACTURES): VIT D 25 HYDROXY: 51.8 ng/mL (ref 30.0–100.0)

## 2017-09-14 DIAGNOSIS — M9904 Segmental and somatic dysfunction of sacral region: Secondary | ICD-10-CM | POA: Diagnosis not present

## 2017-09-14 DIAGNOSIS — M9901 Segmental and somatic dysfunction of cervical region: Secondary | ICD-10-CM | POA: Diagnosis not present

## 2017-09-14 DIAGNOSIS — M5430 Sciatica, unspecified side: Secondary | ICD-10-CM | POA: Diagnosis not present

## 2017-09-14 DIAGNOSIS — M9903 Segmental and somatic dysfunction of lumbar region: Secondary | ICD-10-CM | POA: Diagnosis not present

## 2017-09-15 ENCOUNTER — Ambulatory Visit (INDEPENDENT_AMBULATORY_CARE_PROVIDER_SITE_OTHER): Payer: Medicare Other | Admitting: Family Medicine

## 2017-09-15 ENCOUNTER — Encounter: Payer: Self-pay | Admitting: Family Medicine

## 2017-09-15 VITALS — BP 147/62 | HR 56 | Temp 96.6°F | Ht 59.5 in | Wt 168.0 lb

## 2017-09-15 DIAGNOSIS — E7849 Other hyperlipidemia: Secondary | ICD-10-CM | POA: Diagnosis not present

## 2017-09-15 DIAGNOSIS — R7303 Prediabetes: Secondary | ICD-10-CM

## 2017-09-15 DIAGNOSIS — E875 Hyperkalemia: Secondary | ICD-10-CM

## 2017-09-15 DIAGNOSIS — E559 Vitamin D deficiency, unspecified: Secondary | ICD-10-CM | POA: Diagnosis not present

## 2017-09-15 DIAGNOSIS — R350 Frequency of micturition: Secondary | ICD-10-CM | POA: Diagnosis not present

## 2017-09-15 DIAGNOSIS — I10 Essential (primary) hypertension: Secondary | ICD-10-CM | POA: Diagnosis not present

## 2017-09-15 DIAGNOSIS — G8929 Other chronic pain: Secondary | ICD-10-CM | POA: Diagnosis not present

## 2017-09-15 DIAGNOSIS — M25511 Pain in right shoulder: Secondary | ICD-10-CM | POA: Diagnosis not present

## 2017-09-15 MED ORDER — METOPROLOL TARTRATE 25 MG PO TABS: 25.0000 mg | ORAL_TABLET | Freq: Two times a day (BID) | ORAL | 3 refills | Status: DC

## 2017-09-15 NOTE — Progress Notes (Signed)
Subjective:    Patient ID: Misty Smith, female    DOB: 12-Aug-1941, 77 y.o.   MRN: [id number]  HPI Pt here for follow up and management of chronic medical problems which includes hypertension and hyperlipidemia. She is taking medication regularly.  The patient is doing well overall.  This is been a difficult time of the year because she lost her husband during the past year.  She is planning to have cataract surgery on Monday by Dr. Carolynn Sayers.  She complains of arthritis in both shoulders with the right being worse than the left and is seeing the orthopedist next week.  She also complains with some back pain.  Is requesting a refill on metoprolol.  She will be given FOBT to return and has had lab work done which we will review with her during the visit today.  All cholesterol numbers with traditional lipid testing were excellent and at goal with an LDL C being 60 and HDL being 56 and triglycerides being within normal limits.  The blood sugar was good at 97 and the creatinine was good at 1.62 with electrolytes being good except potassium being slightly elevated at 5.5.  The CBC had a normal white blood cell count with a stable hemoglobin at 13.5 and an adequate platelet count.  The vitamin D level was excellent at 51.8 and all liver function tests were within normal limits and these had been elevated in the past.  The patient denies any chest pain or shortness of breath anymore than expected.  She does have slightly more constipation and knows that she needs to drink more water.  She denies any other changes in her bowel habits and denies any nausea vomiting blood in the stool or black tarry bowel movements.  She does have some urinary frequency and has seen Dr. Jeffie Pollock in the past.  She has plans to see the orthopedic specialist next week because of ongoing problems with her right shoulder and pain that radiates down to her right elbow.  She has had a couple shots in the past.  I did mention to her to discuss  with orthopedist about some physical therapy that might be helpful for her.  She does plan to get a cataract in the right eye operated on on Monday of this coming week.      Patient Active Problem List   Diagnosis Date Noted  . Coronary artery disease due to lipid rich plaque 03/18/2016  . Anxiety state 03/18/2016  . Vitamin D deficiency 03/18/2016  . 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 CAD (coronary artery disease) 12/30/2013  . 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 Osteopenia of the elderly 10/26/2013  . Generalized anxiety disorder 09/21/2013  . GERD (gastroesophageal reflux disease) 09/21/2013  . Pulmonary nodule seen on imaging study 09/21/2013  . SYNCOPE 02/02/2009  . Pre-diabetes 02/01/2009  . Hyperlipidemia 02/01/2009  . Essential hypertension 02/01/2009  . DIZZINESS 02/01/2009  . PALPITATIONS 02/01/2009   Outpatient Encounter Medications as of 09/15/2017  Medication Sig  . ALPRAZolam (XANAX) 0.25 MG tablet Take 1 tablet (0.25 mg total) by mouth at bedtime as needed for anxiety.  Marland Kitchen aspirin EC 81 MG tablet Take 1 tablet (81 mg total) by mouth daily.  Marland Kitchen atorvastatin (LIPITOR) 80 MG tablet TAKE AS DIRECTED  . Calcium Carb-Cholecalciferol (CALTRATE 600+D) 600-800 MG-UNIT TABS Take 1 tablet by mouth every evening.   . cholecalciferol (VITAMIN D) 1000 UNITS tablet Take 2,000 Units by mouth daily.  . clobetasol (TEMOVATE) 0.05 %  external solution Apply 1 application topically 2 (two) times daily.  Marland Kitchen desonide (DESOWEN) 0.05 % cream Apply 1 application topically 2 (two) times daily as needed (for rash).   Marland Kitchen escitalopram (LEXAPRO) 20 MG tablet TAKE 1 TABLET (20 MG TOTAL) BY MOUTH DAILY.  Marland Kitchen esomeprazole (NEXIUM) 40 MG capsule TAKE 1 CAPSULE BY MOUTH EVERY DAY  . estradiol (ESTRACE VAGINAL) 0.1 MG/GM vaginal cream Place 2 g vaginally See admin instructions. Place 2 g vaginally 2-3 times weekly.  Marland Kitchen lisinopril (PRINIVIL,ZESTRIL) 30 MG tablet TAKE 1 TABLET (30 MG TOTAL) BY MOUTH  DAILY.  . metoprolol tartrate (LOPRESSOR) 25 MG tablet Take 1 tablet (25 mg total) by mouth 2 (two) times daily.  . Multiple Vitamin (MULTIVITAMIN) tablet Take 1 tablet by mouth every morning.   Marland Kitchen omega-3 acid ethyl esters (LOVAZA) 1 g capsule TAKE 2 CAPSULES BY MOUTH TWICE A DAY  . pioglitazone (ACTOS) 30 MG tablet TAKE 1 TABLET (30 MG TOTAL) BY MOUTH DAILY.  . [DISCONTINUED] escitalopram (LEXAPRO) 20 MG tablet Take 0.5 tablets (10 mg total) by mouth daily.  . [DISCONTINUED] atorvastatin (LIPITOR) 40 MG tablet Take 40 mg by mouth daily.  . [DISCONTINUED] metoprolol tartrate (LOPRESSOR) 25 MG tablet TAKE ONE TABLET (25MG ) BY MOUTH IN THE AM AND TWO TABLETS (50MG ) BY MOUTH IN THE PM.   No facility-administered encounter medications on file as of 09/15/2017.      Review of Systems  Constitutional: Negative.   HENT: Negative.   Eyes: Negative.   Respiratory: Negative.   Cardiovascular: Negative.   Gastrointestinal: Negative.   Endocrine: Negative.   Genitourinary: Negative.   Musculoskeletal: Positive for arthralgias (shoulders - following allusio nect week ) and back pain.  Skin: Negative.   Allergic/Immunologic: Negative.   Neurological: Negative.   Hematological: Negative.   Psychiatric/Behavioral: Negative.        Objective:   Physical Exam  Constitutional: She is oriented to person, place, and time. She appears well-developed and well-nourished. No distress.  The patient is pleasant and relaxed and seems to be doing well considering the loss of her husband that was so dependent on her.  HENT:  Head: Normocephalic and atraumatic.  Right Ear: External ear normal.  Left Ear: External ear normal.  Mouth/Throat: Oropharynx is clear and moist. No oropharyngeal exudate.  Slight nasal congestion bilaterally  Eyes: Conjunctivae and EOM are normal. Pupils are equal, round, and reactive to light. Right eye exhibits no discharge. Left eye exhibits no discharge. No scleral icterus.    Neck: Normal range of motion. Neck supple. No thyromegaly present.  No bruits thyromegaly or anterior cervical adenopathy  Cardiovascular: Normal rate, regular rhythm, normal heart sounds and intact distal pulses.  No murmur heard. Heart has a regular rate and rhythm at 60/min  Pulmonary/Chest: Effort normal and breath sounds normal. No respiratory distress. She has no wheezes. She has no rales. She exhibits no tenderness.  Clear anteriorly and posteriorly  Abdominal: Soft. Bowel sounds are normal. She exhibits no mass. There is tenderness. There is no rebound and no guarding.  Weight is up 6 pounds and patient will work to lose this.  There is epigastric tenderness without liver or spleen enlargement.  She is taking her Nexium regularly and will continue to take this.  She should avoid NSAIDs.  No masses.  Musculoskeletal: Normal range of motion. She exhibits no edema or tenderness.  Some tenderness in the right acromioclavicular joint area with limited range of motion of the right shoulder compared to the  left  Lymphadenopathy:    She has no cervical adenopathy.  Neurological: She is alert and oriented to person, place, and time. She has normal reflexes. No cranial nerve deficit.  Skin: Skin is warm and dry. No rash noted.  Psychiatric: She has a normal mood and affect. Her behavior is normal. Judgment and thought content normal.  Nursing note and vitals reviewed.  BP (!) 147/62 (BP Location: Left Arm)   Pulse (!) 56   Temp (!) 96.6 F (35.9 C) (Oral)   Ht 4' 11.5" (1.511 m)   Wt 168 lb (76.2 kg)   BMI 33.36 kg/m        Assessment & Plan:  1. Other hyperlipidemia -Wynetta Emery all numbers are good and patient will continue with current treatment  2. Essential hypertension -Repeat blood pressure was improved with a reading of 138/80 in the right arm.  3. Vitamin D deficiency -The patient will continue with her current vitamin D replacement  4. Pre-diabetes -Work on weight loss  and continue with his aggressive therapeutic lifestyle changes as possible  5. Chronic right shoulder pain -Follow-up with orthopedic surgeon as planned  Meds ordered this encounter  Medications  . metoprolol tartrate (LOPRESSOR) 25 MG tablet    Sig: Take 1 tablet (25 mg total) by mouth 2 (two) times daily.    Dispense:  180 tablet    Refill:  3   Patient Instructions                       Medicare Annual Wellness Visit  [facility] and the medical providers at [facility] strive to bring you the best medical care.  In doing so we not only want to address your current medical conditions and concerns but also to detect new conditions early and prevent illness, disease and health-related problems.    Medicare offers a yearly Wellness Visit which allows our clinical staff to assess your need for preventative services including immunizations, lifestyle education, counseling to decrease risk of preventable diseases and screening for fall risk and other medical concerns.    This visit is provided free of charge (no copay) for all Medicare recipients. The clinical pharmacists at [facility] have begun to conduct these Wellness Visits which will also include a thorough review of all your medications.    As you primary medical provider recommend that you make an appointment for your Annual Wellness Visit if you have not done so already this year.  You may set up this appointment before you leave today or you may call back ([phone]) and schedule an appointment.  Please make sure when you call that you mention that you are scheduling your Annual Wellness Visit with the clinical pharmacist so that the appointment may be made for the proper length of time.     Continue current medications. Continue good therapeutic lifestyle changes which include good diet and exercise. Fall precautions discussed with patient. If an FOBT was given today- please return it  to our front desk. If you are over 24 years old - you may need Prevnar 60 or the adult Pneumonia vaccine.  **Flu shots are available--- please call and schedule a FLU-CLINIC appointment**  After your visit with [place] today you will receive a survey in the mail or online from [employer] regarding your care with [place]. Please take a moment to fill this out. Your feedback is very important to [place] as you can help [place] better understand  your patient needs as well as improve your experience and satisfaction. WE CARE ABOUT YOU!!!   Follow-up with orthopedist as planned Take extra strength Tylenol as many as 4 daily if needed for pain Drink plenty of fluids and stay well-hydrated Continue to be careful do not put yourself at risk for falling Work on weight loss with diet and exercise Discussed with orthopedist any physical therapy that might be helpful for your shoulder We will call with results of urinalysis and repeat potassium as soon as those tests become available Use nasal saline regularly and keep the house as cool as possible     Arrie Senate MD

## 2017-09-15 NOTE — Patient Instructions (Addendum)
Medicare Annual Wellness Visit  [facility] and the medical providers at [facility] strive to bring you the best medical care.  In doing so we not only want to address your current medical conditions and concerns but also to detect new conditions early and prevent illness, disease and health-related problems.    Medicare offers a yearly Wellness Visit which allows our clinical staff to assess your need for preventative services including immunizations, lifestyle education, counseling to decrease risk of preventable diseases and screening for fall risk and other medical concerns.    This visit is provided free of charge (no copay) for all Medicare recipients. The clinical pharmacists at [facility] have begun to conduct these Wellness Visits which will also include a thorough review of all your medications.    As you primary medical provider recommend that you make an appointment for your Annual Wellness Visit if you have not done so already this year.  You may set up this appointment before you leave today or you may call back ([phone]) and schedule an appointment.  Please make sure when you call that you mention that you are scheduling your Annual Wellness Visit with the clinical pharmacist so that the appointment may be made for the proper length of time.     Continue current medications. Continue good therapeutic lifestyle changes which include good diet and exercise. Fall precautions discussed with patient. If an FOBT was given today- please return it to our front desk. If you are over 79 years old - you may need Prevnar 66 or the adult Pneumonia vaccine.  **Flu shots are available--- please call and schedule a FLU-CLINIC appointment**  After your visit with [place] today you will receive a survey in the mail or online from [employer] regarding your care with [place]. Please take a moment to fill this out. Your feedback is very  important to [place] as you can help [place] better understand your patient needs as well as improve your experience and satisfaction. WE CARE ABOUT YOU!!!   Follow-up with orthopedist as planned Take extra strength Tylenol as many as 4 daily if needed for pain Drink plenty of fluids and stay well-hydrated Continue to be careful do not put yourself at risk for falling Work on weight loss with diet and exercise Discussed with orthopedist any physical therapy that might be helpful for your shoulder We will call with results of urinalysis and repeat potassium as soon as those tests become available Use nasal saline regularly and keep the house as cool as possible

## 2017-09-16 ENCOUNTER — Other Ambulatory Visit: Payer: Self-pay | Admitting: *Deleted

## 2017-09-16 LAB — SPECIMEN STATUS

## 2017-09-16 LAB — MICROSCOPIC EXAMINATION
BACTERIA UA: NONE SEEN
CASTS: NONE SEEN /LPF

## 2017-09-16 LAB — URINALYSIS, COMPLETE
BILIRUBIN UA: NEGATIVE
Glucose, UA: NEGATIVE
KETONES UA: NEGATIVE
Nitrite, UA: NEGATIVE
PROTEIN UA: NEGATIVE
RBC, UA: NEGATIVE
SPEC GRAV UA: 1.01 (ref 1.005–1.030)
Urobilinogen, Ur: 0.2 mg/dL (ref 0.2–1.0)
pH, UA: 7 (ref 5.0–7.5)

## 2017-09-16 LAB — BMP8+EGFR
BUN / CREAT RATIO: 21 (ref 12–28)
BUN: 15 mg/dL (ref 8–27)
CO2: 25 mmol/L (ref 20–29)
CREATININE: 0.73 mg/dL (ref 0.57–1.00)
Calcium: 10.1 mg/dL (ref 8.7–10.3)
Chloride: 97 mmol/L (ref 96–106)
GFR calc Af Amer: 91 mL/min/{1.73_m2} (ref >59)
GFR calc non Af Amer: 79 mL/min/{1.73_m2} (ref >59)
GLUCOSE: 85 mg/dL (ref 65–99)
POTASSIUM: 5.2 mmol/L (ref 3.5–5.2)
SODIUM: 137 mmol/L (ref 134–144)

## 2017-09-16 LAB — URINE CULTURE

## 2017-09-16 NOTE — Telephone Encounter (Signed)
Fa received Pt got 1 month lat time which decreased quanity to #180 Pt wants to get #270  Directions were 1 tab in am & 2 tabs in pm At 09/15/17 OV directions were changed to BID This was sent to pharmacy

## 2017-09-21 DIAGNOSIS — H2511 Age-related nuclear cataract, right eye: Secondary | ICD-10-CM | POA: Diagnosis not present

## 2017-09-21 DIAGNOSIS — H25811 Combined forms of age-related cataract, right eye: Secondary | ICD-10-CM | POA: Diagnosis not present

## 2017-09-24 ENCOUNTER — Other Ambulatory Visit: Payer: Medicare Other

## 2017-09-24 DIAGNOSIS — Z1211 Encounter for screening for malignant neoplasm of colon: Secondary | ICD-10-CM

## 2017-09-25 DIAGNOSIS — M7541 Impingement syndrome of right shoulder: Secondary | ICD-10-CM | POA: Diagnosis not present

## 2017-09-25 LAB — FECAL OCCULT BLOOD, IMMUNOCHEMICAL: FECAL OCCULT BLD: NEGATIVE

## 2017-09-26 DIAGNOSIS — M7540 Impingement syndrome of unspecified shoulder: Secondary | ICD-10-CM | POA: Insufficient documentation

## 2017-09-28 DIAGNOSIS — M9901 Segmental and somatic dysfunction of cervical region: Secondary | ICD-10-CM | POA: Diagnosis not present

## 2017-09-28 DIAGNOSIS — M9904 Segmental and somatic dysfunction of sacral region: Secondary | ICD-10-CM | POA: Diagnosis not present

## 2017-09-28 DIAGNOSIS — M9903 Segmental and somatic dysfunction of lumbar region: Secondary | ICD-10-CM | POA: Diagnosis not present

## 2017-09-28 DIAGNOSIS — M5430 Sciatica, unspecified side: Secondary | ICD-10-CM | POA: Diagnosis not present

## 2017-10-19 DIAGNOSIS — M5430 Sciatica, unspecified side: Secondary | ICD-10-CM | POA: Diagnosis not present

## 2017-10-19 DIAGNOSIS — M9901 Segmental and somatic dysfunction of cervical region: Secondary | ICD-10-CM | POA: Diagnosis not present

## 2017-10-19 DIAGNOSIS — M9904 Segmental and somatic dysfunction of sacral region: Secondary | ICD-10-CM | POA: Diagnosis not present

## 2017-10-19 DIAGNOSIS — M9903 Segmental and somatic dysfunction of lumbar region: Secondary | ICD-10-CM | POA: Diagnosis not present

## 2017-10-30 ENCOUNTER — Encounter: Payer: Self-pay | Admitting: Family Medicine

## 2017-10-30 ENCOUNTER — Ambulatory Visit (INDEPENDENT_AMBULATORY_CARE_PROVIDER_SITE_OTHER): Payer: Medicare Other | Admitting: Family Medicine

## 2017-10-30 VITALS — BP 146/64 | HR 50 | Temp 96.8°F | Ht 59.0 in | Wt 169.0 lb

## 2017-10-30 DIAGNOSIS — N3001 Acute cystitis with hematuria: Secondary | ICD-10-CM | POA: Diagnosis not present

## 2017-10-30 DIAGNOSIS — R300 Dysuria: Secondary | ICD-10-CM | POA: Diagnosis not present

## 2017-10-30 LAB — URINALYSIS
BILIRUBIN UA: NEGATIVE
GLUCOSE, UA: NEGATIVE
Ketones, UA: NEGATIVE
Leukocytes, UA: NEGATIVE
Nitrite, UA: NEGATIVE
PH UA: 5.5 (ref 5.0–7.5)
PROTEIN UA: NEGATIVE
Specific Gravity, UA: 1.015 (ref 1.005–1.030)
Urobilinogen, Ur: 0.2 mg/dL (ref 0.2–1.0)

## 2017-10-30 MED ORDER — NITROFURANTOIN MONOHYD MACRO 100 MG PO CAPS: 100.0000 mg | ORAL_CAPSULE | Freq: Two times a day (BID) | ORAL | 0 refills | Status: AC

## 2017-10-30 NOTE — Progress Notes (Signed)
Subjective: CC: UTI PCP: Chipper Herb, MD EYC:[id number] P [name] is a 78 y.o. female presenting to clinic today for:  1. Urinary symptoms Patient reports a 2 day h/o dysuria, urinary urgency and urinary frequency.  She reports mild associated right sided abdominal and low back pain.  Denies hematuria, fevers, chills, nausea, vomiting, vaginal discharge.  Patient has used nothing for symptoms.  Patient denies a h/o frequent or recurrent UTIs.    ROS: Per HPI  Allergies  Allergen Reactions  . Ciprofloxacin Rash  . Codeine Rash  . Iohexol      Desc: Misty Smith, Misty Smith  12/07/12   . Penicillins Hives  . Sulfonamide Derivatives Hives  . Asa [Aspirin] Other (See Comments)    Large amounts causes stomach pain  . Keflex [Cephalexin] Nausea And Vomiting  . Morphine And Related Rash   Past Medical History:  Diagnosis Date  . Anxiety   . Arthritis   . Bronchitis   . Cataract   . Colovaginal fistula   . Depression   . Diabetes mellitus without complication (Misty Smith)   . Dysrhythmia    palpitations, occasional PVCs; Dr Stanford Breed cardiologist  . Esophageal stricture   . Fractured elbow 1970's  . GERD (gastroesophageal reflux disease)   . H/O hiatal hernia   . Hyperlipidemia   . Hypertension   . Perimenopausal vasomotor symptoms   . Postmenopausal HRT (hormone replacement therapy)   . Pre-diabetes   . Stress   . Subacute lupus erythematosus    Dr. Sol Blazing      Current Outpatient Medications:  .  ALPRAZolam (XANAX) 0.25 MG tablet, Take 1 tablet (0.25 mg total) by mouth at bedtime as needed for anxiety., Disp: 30 tablet, Rfl: 1 .  aspirin EC 81 MG tablet, Take 1 tablet (81 mg total) by mouth daily., Disp: 90 tablet, Rfl:  3 .  atorvastatin (LIPITOR) 80 MG tablet, TAKE AS DIRECTED, Disp: 90 tablet, Rfl: 0 .  Calcium Carb-Cholecalciferol (CALTRATE 600+D) 600-800 MG-UNIT TABS, Take 1 tablet by mouth every evening. , Disp: , Rfl:  .  cholecalciferol (VITAMIN D) 1000 UNITS tablet, Take 2,000 Units by mouth daily., Disp: , Rfl:  .  clobetasol (TEMOVATE) 0.05 % external solution, Apply 1 application topically 2 (two) times daily., Disp: , Rfl:  .  desonide (DESOWEN) 0.05 % cream, Apply 1 application topically 2 (two) times daily as needed (for rash). , Disp: , Rfl:  .  escitalopram (LEXAPRO) 20 MG tablet, TAKE 1 TABLET (20 MG TOTAL) BY MOUTH DAILY., Disp: 90 tablet, Rfl: 1 .  esomeprazole (NEXIUM) 40 MG capsule, TAKE 1 CAPSULE BY MOUTH EVERY DAY, Disp: 90 capsule, Rfl: 1 .  estradiol (ESTRACE VAGINAL) 0.1 MG/GM vaginal cream, Place 2 g vaginally See admin instructions. Place 2 g vaginally 2-3 times weekly., Disp: 42.5 g, Rfl: 3 .  lisinopril (PRINIVIL,ZESTRIL) 30 MG tablet, TAKE 1 TABLET (30 MG TOTAL) BY MOUTH DAILY., Disp: 90 tablet, Rfl: 0 .  metoprolol tartrate (LOPRESSOR) 25 MG tablet, Take 1 tablet (25 mg total) by mouth 2 (two) times daily., Disp: 180 tablet, Rfl: 3 .  Multiple Vitamin (MULTIVITAMIN) tablet, Take 1 tablet by mouth every morning. , Disp: , Rfl:  .  omega-3 acid ethyl esters (  LOVAZA) 1 g capsule, TAKE 2 CAPSULES BY MOUTH TWICE A DAY, Disp: 360 capsule, Rfl: 1 .  pioglitazone (ACTOS) 30 MG tablet, TAKE 1 TABLET (30 MG TOTAL) BY MOUTH DAILY., Disp: 90 tablet, Rfl: 0 .  nitrofurantoin, macrocrystal-monohydrate, (MACROBID) 100 MG capsule, Take 1 capsule (100 mg total) by mouth 2 (two) times daily for 5 days., Disp: 10 capsule, Rfl: 0 Social History   Socioeconomic History  . Marital status: Married    Spouse name: Milbert Coulter  . Number of children: Not on file  . Years of education: Not on file  . Highest education level: Not on file  Social Needs  . Financial resource strain: Not on file  . Food  insecurity - worry: Not on file  . Food insecurity - inability: Not on file  . Transportation needs - medical: Not on file  . Transportation needs - non-medical: Not on file  Occupational History  . Not on file  Tobacco Use  . Smoking status: Never Smoker  . Smokeless tobacco: Never Used  Substance and Sexual Activity  . Alcohol use: No    Alcohol/week: 0.0 oz  . Drug use: No  . Sexual activity: No  Other Topics Concern  . Not on file  Social History Narrative  . Not on file   Family History  Problem Relation Age of Onset  . Cancer Mother        laryngeal   . Hypertension Mother   . Heart disease Mother   . Hypertension Father   . Coronary artery disease Father   . Kidney disease Father   . Breast cancer Sister   . Hypertension Sister   . Cancer Sister        breast  . Heart attack Sister   . Heart disease Sister   . Stroke Sister   . Stroke Sister   . Hypertension Brother   . Cancer Brother        liver  . Hypertension Sister   . Hyperlipidemia Sister   . Hypertension Brother     Objective: Office vital signs reviewed. BP (!) 146/64   Pulse (!) 50   Temp (!) 96.8 F (36 C) (Oral)   Ht 4\' 11"  (1.499 m)   Wt 169 lb (76.7 kg)   BMI 34.13 kg/m   Physical Examination:  General: Awake, alert, well nourished, nontoxic, No acute distress MSK: No CVA tenderness to palpation Neuro: Alert and oriented, follows commands.  Assessment/ Plan: 76 y.o. female   1. Dysuria Urinalysis with trace intact blood.  It was negative for nitrites and leukocytes.  Urine microscopy not available after hours.  I have sent this for urine culture.  Given the constellation of her symptoms, will treat as a urinary tract infection.  She is currently afebrile and demonstrates no signs of pyelonephritis.  Because of her multiple drug allergies, I have elected to treat her with Macrobid p.o. twice daily for the next 5 days.  Will contact her with the results of her urine culture.  Home  care instructions reviewed.  Strict return precautions and reasons for emergent evaluation discussed.  She voiced good understanding follow-up as needed. - Urinalysis - Urine Culture   Orders Placed This Encounter  Procedures  . Urine Culture  . Urinalysis   Meds ordered this encounter  Medications  . nitrofurantoin, macrocrystal-monohydrate, (MACROBID) 100 MG capsule    Sig: Take 1 capsule (100 mg total) by mouth 2 (two) times daily for 5 days.  Dispense:  10 capsule    Refill:  [place], DO [facility] [phone]

## 2017-11-01 LAB — URINE CULTURE

## 2017-11-02 DIAGNOSIS — M9904 Segmental and somatic dysfunction of sacral region: Secondary | ICD-10-CM | POA: Diagnosis not present

## 2017-11-02 DIAGNOSIS — M5430 Sciatica, unspecified side: Secondary | ICD-10-CM | POA: Diagnosis not present

## 2017-11-02 DIAGNOSIS — M9903 Segmental and somatic dysfunction of lumbar region: Secondary | ICD-10-CM | POA: Diagnosis not present

## 2017-11-02 DIAGNOSIS — M9901 Segmental and somatic dysfunction of cervical region: Secondary | ICD-10-CM | POA: Diagnosis not present

## 2017-11-04 DIAGNOSIS — M5430 Sciatica, unspecified side: Secondary | ICD-10-CM | POA: Diagnosis not present

## 2017-11-04 DIAGNOSIS — M9901 Segmental and somatic dysfunction of cervical region: Secondary | ICD-10-CM | POA: Diagnosis not present

## 2017-11-04 DIAGNOSIS — M9904 Segmental and somatic dysfunction of sacral region: Secondary | ICD-10-CM | POA: Diagnosis not present

## 2017-11-04 DIAGNOSIS — M9903 Segmental and somatic dysfunction of lumbar region: Secondary | ICD-10-CM | POA: Diagnosis not present

## 2017-11-09 DIAGNOSIS — M9901 Segmental and somatic dysfunction of cervical region: Secondary | ICD-10-CM | POA: Diagnosis not present

## 2017-11-09 DIAGNOSIS — M9903 Segmental and somatic dysfunction of lumbar region: Secondary | ICD-10-CM | POA: Diagnosis not present

## 2017-11-09 DIAGNOSIS — M5430 Sciatica, unspecified side: Secondary | ICD-10-CM | POA: Diagnosis not present

## 2017-11-09 DIAGNOSIS — M9904 Segmental and somatic dysfunction of sacral region: Secondary | ICD-10-CM | POA: Diagnosis not present

## 2017-11-10 DIAGNOSIS — H2512 Age-related nuclear cataract, left eye: Secondary | ICD-10-CM | POA: Diagnosis not present

## 2017-11-11 DIAGNOSIS — M9903 Segmental and somatic dysfunction of lumbar region: Secondary | ICD-10-CM | POA: Diagnosis not present

## 2017-11-11 DIAGNOSIS — M9901 Segmental and somatic dysfunction of cervical region: Secondary | ICD-10-CM | POA: Diagnosis not present

## 2017-11-11 DIAGNOSIS — M5430 Sciatica, unspecified side: Secondary | ICD-10-CM | POA: Diagnosis not present

## 2017-11-11 DIAGNOSIS — M9904 Segmental and somatic dysfunction of sacral region: Secondary | ICD-10-CM | POA: Diagnosis not present

## 2017-11-16 DIAGNOSIS — H2512 Age-related nuclear cataract, left eye: Secondary | ICD-10-CM | POA: Diagnosis not present

## 2017-11-16 DIAGNOSIS — H25812 Combined forms of age-related cataract, left eye: Secondary | ICD-10-CM | POA: Diagnosis not present

## 2017-11-17 ENCOUNTER — Other Ambulatory Visit: Payer: Self-pay | Admitting: *Deleted

## 2017-11-17 MED ORDER — METOPROLOL TARTRATE 25 MG PO TABS: 25.0000 mg | ORAL_TABLET | Freq: Two times a day (BID) | ORAL | 0 refills | Status: DC

## 2017-11-18 DIAGNOSIS — M9901 Segmental and somatic dysfunction of cervical region: Secondary | ICD-10-CM | POA: Diagnosis not present

## 2017-11-18 DIAGNOSIS — M9903 Segmental and somatic dysfunction of lumbar region: Secondary | ICD-10-CM | POA: Diagnosis not present

## 2017-11-18 DIAGNOSIS — M5430 Sciatica, unspecified side: Secondary | ICD-10-CM | POA: Diagnosis not present

## 2017-11-18 DIAGNOSIS — M9904 Segmental and somatic dysfunction of sacral region: Secondary | ICD-10-CM | POA: Diagnosis not present

## 2017-11-25 ENCOUNTER — Other Ambulatory Visit: Payer: Self-pay | Admitting: *Deleted

## 2017-11-25 MED ORDER — PIOGLITAZONE HCL 30 MG PO TABS: ORAL_TABLET | ORAL | 0 refills | Status: DC

## 2017-11-26 ENCOUNTER — Other Ambulatory Visit: Payer: Self-pay | Admitting: Family Medicine

## 2017-12-01 DIAGNOSIS — M9903 Segmental and somatic dysfunction of lumbar region: Secondary | ICD-10-CM | POA: Diagnosis not present

## 2017-12-01 DIAGNOSIS — M9901 Segmental and somatic dysfunction of cervical region: Secondary | ICD-10-CM | POA: Diagnosis not present

## 2017-12-01 DIAGNOSIS — M5430 Sciatica, unspecified side: Secondary | ICD-10-CM | POA: Diagnosis not present

## 2017-12-01 DIAGNOSIS — M9904 Segmental and somatic dysfunction of sacral region: Secondary | ICD-10-CM | POA: Diagnosis not present

## 2017-12-15 DIAGNOSIS — M9903 Segmental and somatic dysfunction of lumbar region: Secondary | ICD-10-CM | POA: Diagnosis not present

## 2017-12-15 DIAGNOSIS — M5430 Sciatica, unspecified side: Secondary | ICD-10-CM | POA: Diagnosis not present

## 2017-12-15 DIAGNOSIS — M9901 Segmental and somatic dysfunction of cervical region: Secondary | ICD-10-CM | POA: Diagnosis not present

## 2017-12-15 DIAGNOSIS — M9904 Segmental and somatic dysfunction of sacral region: Secondary | ICD-10-CM | POA: Diagnosis not present

## 2017-12-23 DIAGNOSIS — H35352 Cystoid macular degeneration, left eye: Secondary | ICD-10-CM | POA: Diagnosis not present

## 2017-12-24 DIAGNOSIS — M7541 Impingement syndrome of right shoulder: Secondary | ICD-10-CM | POA: Diagnosis not present

## 2017-12-26 ENCOUNTER — Other Ambulatory Visit: Payer: Self-pay | Admitting: Family Medicine

## 2017-12-28 DIAGNOSIS — M25511 Pain in right shoulder: Secondary | ICD-10-CM | POA: Diagnosis not present

## 2017-12-28 DIAGNOSIS — M7541 Impingement syndrome of right shoulder: Secondary | ICD-10-CM | POA: Diagnosis not present

## 2017-12-29 DIAGNOSIS — M9901 Segmental and somatic dysfunction of cervical region: Secondary | ICD-10-CM | POA: Diagnosis not present

## 2017-12-29 DIAGNOSIS — M9903 Segmental and somatic dysfunction of lumbar region: Secondary | ICD-10-CM | POA: Diagnosis not present

## 2017-12-29 DIAGNOSIS — M5430 Sciatica, unspecified side: Secondary | ICD-10-CM | POA: Diagnosis not present

## 2017-12-29 DIAGNOSIS — M9904 Segmental and somatic dysfunction of sacral region: Secondary | ICD-10-CM | POA: Diagnosis not present

## 2018-01-05 ENCOUNTER — Other Ambulatory Visit: Payer: Medicare Other

## 2018-01-05 DIAGNOSIS — E7849 Other hyperlipidemia: Secondary | ICD-10-CM

## 2018-01-05 DIAGNOSIS — K219 Gastro-esophageal reflux disease without esophagitis: Secondary | ICD-10-CM

## 2018-01-05 DIAGNOSIS — E559 Vitamin D deficiency, unspecified: Secondary | ICD-10-CM | POA: Diagnosis not present

## 2018-01-05 DIAGNOSIS — R7303 Prediabetes: Secondary | ICD-10-CM | POA: Diagnosis not present

## 2018-01-05 DIAGNOSIS — I10 Essential (primary) hypertension: Secondary | ICD-10-CM

## 2018-01-06 ENCOUNTER — Telehealth: Payer: Self-pay | Admitting: Family Medicine

## 2018-01-06 DIAGNOSIS — H35352 Cystoid macular degeneration, left eye: Secondary | ICD-10-CM | POA: Diagnosis not present

## 2018-01-06 LAB — CBC WITH DIFFERENTIAL/PLATELET
BASOS: 0 %
Basophils Absolute: 0 10*3/uL (ref 0.0–0.2)
EOS (ABSOLUTE): 0.1 10*3/uL (ref 0.0–0.4)
Eos: 2 %
HEMOGLOBIN: 12.5 g/dL (ref 11.1–15.9)
Hematocrit: 38.2 % (ref 34.0–46.6)
IMMATURE GRANS (ABS): 0 10*3/uL (ref 0.0–0.1)
Immature Granulocytes: 0 %
LYMPHS: 37 %
Lymphocytes Absolute: 1.9 10*3/uL (ref 0.7–3.1)
MCH: 31.3 pg (ref 26.6–33.0)
MCHC: 32.7 g/dL (ref 31.5–35.7)
MCV: 96 fL (ref 79–97)
MONOCYTES: 10 %
Monocytes Absolute: 0.5 10*3/uL (ref 0.1–0.9)
NEUTROS ABS: 2.5 10*3/uL (ref 1.4–7.0)
Neutrophils: 51 %
Platelets: 216 10*3/uL (ref 150–379)
RBC: 4 x10E6/uL (ref 3.77–5.28)
RDW: 14.1 % (ref 12.3–15.4)
WBC: 5.1 10*3/uL (ref 3.4–10.8)

## 2018-01-06 LAB — BMP8+EGFR
BUN/Creatinine Ratio: 19 (ref 12–28)
BUN: 12 mg/dL (ref 8–27)
CALCIUM: 9.6 mg/dL (ref 8.7–10.3)
CO2: 23 mmol/L (ref 20–29)
Chloride: 102 mmol/L (ref 96–106)
Creatinine, Ser: 0.63 mg/dL (ref 0.57–1.00)
GFR, EST AFRICAN AMERICAN: 99 mL/min/{1.73_m2} (ref >59)
GFR, EST NON AFRICAN AMERICAN: 86 mL/min/{1.73_m2} (ref >59)
Glucose: 101 mg/dL — ABNORMAL HIGH (ref 65–99)
POTASSIUM: 5.3 mmol/L — AB (ref 3.5–5.2)
Sodium: 140 mmol/L (ref 134–144)

## 2018-01-06 LAB — HEPATIC FUNCTION PANEL
ALBUMIN: 3.8 g/dL (ref 3.5–4.8)
ALT: 33 IU/L — AB (ref 0–32)
AST: 40 IU/L (ref 0–40)
Alkaline Phosphatase: 68 IU/L (ref 39–117)
BILIRUBIN TOTAL: 0.3 mg/dL (ref 0.0–1.2)
Bilirubin, Direct: 0.12 mg/dL (ref 0.00–0.40)
TOTAL PROTEIN: 6.6 g/dL (ref 6.0–8.5)

## 2018-01-06 LAB — LIPID PANEL
CHOL/HDL RATIO: 2.9 ratio (ref 0.0–4.4)
Cholesterol, Total: 149 mg/dL (ref 100–199)
HDL: 51 mg/dL (ref >39)
LDL CALC: 69 mg/dL (ref 0–99)
Triglycerides: 147 mg/dL (ref 0–149)
VLDL CHOLESTEROL CAL: 29 mg/dL (ref 5–40)

## 2018-01-06 LAB — VITAMIN D 25 HYDROXY (VIT D DEFICIENCY, FRACTURES): VIT D 25 HYDROXY: 54.4 ng/mL (ref 30.0–100.0)

## 2018-01-06 NOTE — Telephone Encounter (Signed)
Patient informed of results by Georgina Pillion

## 2018-01-11 DIAGNOSIS — D3132 Benign neoplasm of left choroid: Secondary | ICD-10-CM | POA: Diagnosis not present

## 2018-01-11 DIAGNOSIS — H35352 Cystoid macular degeneration, left eye: Secondary | ICD-10-CM | POA: Diagnosis not present

## 2018-01-11 DIAGNOSIS — E119 Type 2 diabetes mellitus without complications: Secondary | ICD-10-CM | POA: Diagnosis not present

## 2018-01-12 DIAGNOSIS — M75101 Unspecified rotator cuff tear or rupture of right shoulder, not specified as traumatic: Secondary | ICD-10-CM | POA: Insufficient documentation

## 2018-01-12 DIAGNOSIS — M19011 Primary osteoarthritis, right shoulder: Secondary | ICD-10-CM | POA: Diagnosis not present

## 2018-01-12 DIAGNOSIS — M25511 Pain in right shoulder: Secondary | ICD-10-CM | POA: Diagnosis not present

## 2018-01-12 DIAGNOSIS — M19019 Primary osteoarthritis, unspecified shoulder: Secondary | ICD-10-CM | POA: Insufficient documentation

## 2018-01-12 DIAGNOSIS — M75121 Complete rotator cuff tear or rupture of right shoulder, not specified as traumatic: Secondary | ICD-10-CM | POA: Diagnosis not present

## 2018-01-13 ENCOUNTER — Ambulatory Visit (INDEPENDENT_AMBULATORY_CARE_PROVIDER_SITE_OTHER): Payer: Medicare Other | Admitting: Family Medicine

## 2018-01-13 ENCOUNTER — Encounter: Payer: Self-pay | Admitting: Family Medicine

## 2018-01-13 VITALS — BP 155/60 | HR 55 | Temp 97.1°F | Ht 59.0 in | Wt 170.0 lb

## 2018-01-13 DIAGNOSIS — I10 Essential (primary) hypertension: Secondary | ICD-10-CM | POA: Diagnosis not present

## 2018-01-13 DIAGNOSIS — E7849 Other hyperlipidemia: Secondary | ICD-10-CM | POA: Diagnosis not present

## 2018-01-13 DIAGNOSIS — E559 Vitamin D deficiency, unspecified: Secondary | ICD-10-CM

## 2018-01-13 DIAGNOSIS — Z6834 Body mass index (BMI) 34.0-34.9, adult: Secondary | ICD-10-CM | POA: Diagnosis not present

## 2018-01-13 DIAGNOSIS — M75121 Complete rotator cuff tear or rupture of right shoulder, not specified as traumatic: Secondary | ICD-10-CM | POA: Diagnosis not present

## 2018-01-13 DIAGNOSIS — R7303 Prediabetes: Secondary | ICD-10-CM

## 2018-01-13 DIAGNOSIS — E875 Hyperkalemia: Secondary | ICD-10-CM | POA: Diagnosis not present

## 2018-01-13 MED ORDER — HYDROCHLOROTHIAZIDE 12.5 MG PO TABS: 12.5000 mg | ORAL_TABLET | Freq: Every day | ORAL | 3 refills | Status: DC

## 2018-01-13 NOTE — Progress Notes (Addendum)
Subjective:    Patient ID: Misty Smith, female    DOB: [birth year], 77 y.o.   MRN: [id number]  HPI Pt here for follow up and management of chronic medical problems which includes hypertension and hyperlipidemia. She is taking medication regularly.  Patient has ongoing right shoulder pain and has seen the orthopedist recently, Dr. Alma Friendly because of a right rotator cuff tear.  We will make sure that she has a clearance from cardiology before the surgery.  The lab work is already been done on her and the vitamin D level was excellent at 54.4.  The blood sugar was slightly elevated at 101 and the creatinine and electrolytes were good except the potassium was elevated by 1/10 of a point.  We will recheck the potassium in a couple weeks or today.  The CBC had a normal white blood cell count a good hemoglobin at 12.5 and an adequate platelet count.  All cholesterol numbers were excellent with an LDL C being 69.  All liver function tests were normal except the ALT was elevated by one-point.  Patient wants to be active and would like to get the shoulder surgery done as long as it is okay cardiology wise.  She does see Dr. Jacalyn Lefevre sees him yearly and has been notified to call and set up an appointment to see him again in the summer.  She denies any chest pain except she did have a twinge of discomfort just recently but nothing that was prolonged.  She has shortness of breath but usually when the humidity is up and that is not been any worse than usual.  She has irritable bowel syndrome type symptoms and does take Nexium and does have occasional constipation and diarrhea.  When the diarrhea happens she may have more indigestion and has to take an extra her ranitidine at that time.  She has not seen any blood in the stool or had any black tarry bowel movements and is passing her water without problems.  She has had bilateral cataract surgery since the first of the year and is having a lot of floater issues in the  left eye and is currently seeing 1 of the specialist about this.  Her last chest x-ray was in August of this past year and an EKG that was done by the cardiologist in July of last year.     Patient Active Problem List   Diagnosis Date Noted  . Coronary artery disease due to lipid rich plaque 03/18/2016  . Anxiety state 03/18/2016  . Vitamin D deficiency 03/18/2016  . 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 CAD (coronary artery disease) 12/30/2013  . 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 Osteopenia of the elderly 10/26/2013  . Generalized anxiety disorder 09/21/2013  . GERD (gastroesophageal reflux disease) 09/21/2013  . Pulmonary nodule seen on imaging study 09/21/2013  . SYNCOPE 02/02/2009  . Pre-diabetes 02/01/2009  . Hyperlipidemia 02/01/2009  . Essential hypertension 02/01/2009  . DIZZINESS 02/01/2009  . PALPITATIONS 02/01/2009   Outpatient Encounter Medications as of 01/13/2018  Medication Sig  . ALPRAZolam (XANAX) 0.25 MG tablet Take 1 tablet (0.25 mg total) by mouth at bedtime as needed for anxiety.  Marland Kitchen aspirin EC 81 MG tablet Take 1 tablet (81 mg total) by mouth daily.  Marland Kitchen atorvastatin (LIPITOR) 80 MG tablet TAKE AS DIRECTED  . Calcium Carb-Cholecalciferol (CALTRATE 600+D) 600-800 MG-UNIT TABS Take 1 tablet by mouth every evening.   . cholecalciferol (VITAMIN D) 1000 UNITS tablet Take 2,000 Units by mouth  daily.  . clobetasol (TEMOVATE) 0.05 % external solution Apply 1 application topically 2 (two) times daily.  Marland Kitchen desonide (DESOWEN) 0.05 % cream Apply 1 application topically 2 (two) times daily as needed (for rash).   Marland Kitchen escitalopram (LEXAPRO) 20 MG tablet TAKE 1 TABLET (20 MG TOTAL) BY MOUTH DAILY.  Marland Kitchen esomeprazole (NEXIUM) 40 MG capsule TAKE 1 CAPSULE BY MOUTH EVERY DAY  . estradiol (ESTRACE VAGINAL) 0.1 MG/GM vaginal cream Place 2 g vaginally See admin instructions. Place 2 g vaginally 2-3 times weekly.  Marland Kitchen lisinopril (PRINIVIL,ZESTRIL) 30 MG tablet TAKE 1 TABLET (30 MG  TOTAL) BY MOUTH DAILY.  . metoprolol tartrate (LOPRESSOR) 25 MG tablet Take 1 tablet (25 mg total) by mouth 2 (two) times daily.  . Multiple Vitamin (MULTIVITAMIN) tablet Take 1 tablet by mouth every morning.   Marland Kitchen omega-3 acid ethyl esters (LOVAZA) 1 g capsule TAKE 2 CAPSULES BY MOUTH TWICE A DAY  . pioglitazone (ACTOS) 30 MG tablet TAKE 1 TABLET (30 MG TOTAL) BY MOUTH DAILY.   No facility-administered encounter medications on file as of 01/13/2018.      Review of Systems  Constitutional: Negative.   HENT: Negative.   Eyes: Negative.   Respiratory: Negative.   Cardiovascular: Negative.   Gastrointestinal: Negative.   Endocrine: Negative.   Genitourinary: Negative.   Musculoskeletal: Positive for arthralgias (soreness of right shoulder pain ).  Skin: Negative.   Allergic/Immunologic: Negative.   Neurological: Negative.   Hematological: Negative.   Psychiatric/Behavioral: Negative.        Objective:   Physical Exam  Constitutional: She is oriented to person, place, and time. She appears well-developed and well-nourished. No distress.  The patient is pleasant and admits to being more stressed with being in the doctor's office.  She does see the cardiologist regularly on a yearly basis and has been notified that a visit will be done soon.  She says she does not check her blood pressures as regularly as she used to and that her systolic readings have been higher recently.  HENT:  Head: Normocephalic and atraumatic.  Right Ear: External ear normal.  Left Ear: External ear normal.  Mouth/Throat: Oropharynx is clear and moist.  Nasal turbinate congestion  Eyes: Pupils are equal, round, and reactive to light. Conjunctivae and EOM are normal. Right eye exhibits no discharge. Left eye exhibits no discharge. No scleral icterus.  Status post bilateral cataract surgery  Neck: Normal range of motion. Neck supple. No thyromegaly present.  No bruits thyromegaly or anterior cervical adenopathy    Cardiovascular: Normal rate, regular rhythm, normal heart sounds and intact distal pulses.  No murmur heard. Heart is regular at 60/min  Pulmonary/Chest: Effort normal and breath sounds normal. She has no wheezes. She has no rales.  Clear anteriorly and posteriorly  Abdominal: Soft. Bowel sounds are normal. She exhibits no distension and no mass. There is tenderness. There is no rebound and no guarding. No hernia.  Generalized abdominal tenderness in the epigastric area around the umbilicus in the lower abdomen.  The patient did have an incisional scar in the umbilicus.  Musculoskeletal: Normal range of motion. She exhibits no edema or tenderness.  Some pain with laying down on the table in her back.  Lymphadenopathy:    She has no cervical adenopathy.  Neurological: She is alert and oriented to person, place, and time. She has normal reflexes. No cranial nerve deficit.  Skin: Skin is warm and dry. No rash noted.  Psychiatric: She has a normal mood  and affect. Her behavior is normal. Judgment and thought content normal.  The patient is pleasant with normal affect and mood.  Nursing note and vitals reviewed.  BP (!) 155/60 (BP Location: Left Arm)   Pulse (!) 55   Temp (!) 97.1 F (36.2 C) (Oral)   Ht '4\' 11"'$  (1.499 m)   Wt 170 lb (77.1 kg)   BMI 34.34 kg/m         Assessment & Plan:  1. Essential hypertension -The blood pressure is more elevated and this was checked on 3 or 4 different occasions today.  The systolic was elevated consistently in the 180 range and the diastolic between 80 and 675 range.  We will go ahead and add HCTZ 12.5 mg to her current treatment regimen which consist of lisinopril 30 mg and metoprolol 25 mg twice daily. -The patient will come by and a few days and have her blood pressure rechecked and bring readings from home at that time.  In a couple weeks she will need another BMP.  2. Vitamin D deficiency -Tinea with vitamin D replacement  3. Other  hyperlipidemia -Continue with cholesterol treatment as cholesterol numbers were good  4. Pre-diabetes -Continue with Actos  5. Serum potassium elevated -Repeat potassium - BMP8+EGFR  6. Complete tear of right rotator cuff, unspecified whether traumatic -Cardiac clearance for rotator cuff tear so that surgery can be done by Dr. Veverly Fells  7.  BMI 34.0-34.9 adult -All efforts to lose weight and lower BMI through diet and exercise.  This was discussed with the patient during the visit.  Meds ordered this encounter  Medications  . hydrochlorothiazide (HYDRODIURIL) 12.5 MG tablet    Sig: Take 1 tablet (12.5 mg total) by mouth daily.    Dispense:  90 tablet    Refill:  3   Patient Instructions                       Medicare Annual Wellness Visit  [facility] and the medical providers at [facility] strive to bring you the best medical care.  In doing so we not only want to address your current medical conditions and concerns but also to detect new conditions early and prevent illness, disease and health-related problems.    Medicare offers a yearly Wellness Visit which allows our clinical staff to assess your need for preventative services including immunizations, lifestyle education, counseling to decrease risk of preventable diseases and screening for fall risk and other medical concerns.    This visit is provided free of charge (no copay) for all Medicare recipients. The clinical pharmacists at [facility] have begun to conduct these Wellness Visits which will also include a thorough review of all your medications.    As you primary medical provider recommend that you make an appointment for your Annual Wellness Visit if you have not done so already this year.  You may set up this appointment before you leave today or you may call back ([phone]) and schedule an appointment.  Please make sure when you call that you mention that you are  scheduling your Annual Wellness Visit with the clinical pharmacist so that the appointment may be made for the proper length of time.     Continue current medications. Continue good therapeutic lifestyle changes which include good diet and exercise. Fall precautions discussed with patient. If an FOBT was given today- please return it to our front desk. If you are over 50  years old - you may need Prevnar 14 or the adult Pneumonia vaccine.  **Flu shots are available--- please call and schedule a FLU-CLINIC appointment**  After your visit with [place] today you will receive a survey in the mail or online from [employer] regarding your care with [place]. Please take a moment to fill this out. Your feedback is very important to [place] as you can help [place] better understand your patient needs as well as improve your experience and satisfaction. WE CARE ABOUT YOU!!!  We have spoken with Dr. Stanford Breed he will try to arrange your appointment for cardiac clearance sooner than your July appointment so that you can go ahead and get your shoulder repaired rather than waiting till the middle of the summer In the meantime please check some blood pressures at home since your blood pressure on 3 occasions today was elevated for the systolic reading. We will add HCTZ 12.5 to your current treatment regimen He will make all efforts possible to lose a little bit of weight and watch her sodium intake more closely We would ask you to come back in the next 3 to 4 days and have the blood pressure rechecked and bring readings from home at that time    Arrie Senate MD

## 2018-01-13 NOTE — Patient Instructions (Addendum)
Medicare Annual Wellness Visit  [facility] and the medical providers at [facility] strive to bring you the best medical care.  In doing so we not only want to address your current medical conditions and concerns but also to detect new conditions early and prevent illness, disease and health-related problems.    Medicare offers a yearly Wellness Visit which allows our clinical staff to assess your need for preventative services including immunizations, lifestyle education, counseling to decrease risk of preventable diseases and screening for fall risk and other medical concerns.    This visit is provided free of charge (no copay) for all Medicare recipients. The clinical pharmacists at [facility] have begun to conduct these Wellness Visits which will also include a thorough review of all your medications.    As you primary medical provider recommend that you make an appointment for your Annual Wellness Visit if you have not done so already this year.  You may set up this appointment before you leave today or you may call back ([phone]) and schedule an appointment.  Please make sure when you call that you mention that you are scheduling your Annual Wellness Visit with the clinical pharmacist so that the appointment may be made for the proper length of time.     Continue current medications. Continue good therapeutic lifestyle changes which include good diet and exercise. Fall precautions discussed with patient. If an FOBT was given today- please return it to our front desk. If you are over 63 years old - you may need Prevnar 58 or the adult Pneumonia vaccine.  **Flu shots are available--- please call and schedule a FLU-CLINIC appointment**  After your visit with [place] today you will receive a survey in the mail or online from [employer] regarding your care with [place]. Please take a moment to fill this out. Your feedback is very  important to [place] as you can help [place] better understand your patient needs as well as improve your experience and satisfaction. WE CARE ABOUT YOU!!!  We have spoken with Dr. Stanford Breed he will try to arrange your appointment for cardiac clearance sooner than your July appointment so that you can go ahead and get your shoulder repaired rather than waiting till the middle of the summer In the meantime please check some blood pressures at home since your blood pressure on 3 occasions today was elevated for the systolic reading. We will add HCTZ 12.5 to your current treatment regimen He will make all efforts possible to lose a little bit of weight and watch her sodium intake more closely We would ask you to come back in the next 3 to 4 days and have the blood pressure rechecked and bring readings from home at that time

## 2018-01-14 LAB — BMP8+EGFR
BUN/Creatinine Ratio: 20 (ref 12–28)
BUN: 17 mg/dL (ref 8–27)
CHLORIDE: 99 mmol/L (ref 96–106)
CO2: 22 mmol/L (ref 20–29)
Calcium: 9.8 mg/dL (ref 8.7–10.3)
Creatinine, Ser: 0.87 mg/dL (ref 0.57–1.00)
GFR, EST AFRICAN AMERICAN: 74 mL/min/{1.73_m2} (ref >59)
GFR, EST NON AFRICAN AMERICAN: 64 mL/min/{1.73_m2} (ref >59)
Glucose: 94 mg/dL (ref 65–99)
POTASSIUM: 4.4 mmol/L (ref 3.5–5.2)
SODIUM: 141 mmol/L (ref 134–144)

## 2018-01-18 ENCOUNTER — Ambulatory Visit: Payer: Medicare Other | Admitting: *Deleted

## 2018-01-18 VITALS — BP 167/67 | HR 58

## 2018-01-18 DIAGNOSIS — Z0130 Encounter for examination of blood pressure without abnormal findings: Secondary | ICD-10-CM

## 2018-01-18 NOTE — Progress Notes (Signed)
Pt here for BP ck BP 146 68 P 58

## 2018-01-22 ENCOUNTER — Telehealth: Payer: Self-pay

## 2018-01-22 NOTE — Telephone Encounter (Signed)
   [facility] Pre-operative Risk Assessment    Request for surgical clearance:  1. What type of surgery is being performed? Right Shoulder scope  2. When is this surgery scheduled? TBD  3. What type of clearance is required (medical clearance vs. Pharmacy clearance to hold med vs. Both)? Both  4. Are there any medications that need to be held prior to surgery and how long? Aspirin  5. Practice name and name of physician performing surgery? [facility]   Dr.Steven Norris  6. What is your office phone number [phone]   7.   What is your office fax number [phone]  8.   Anesthesia type (None, local, MAC, general) ? Karie Soda 01/22/2018, 2:44 PM  _________________________________________________________________   (provider comments below)

## 2018-01-22 NOTE — Telephone Encounter (Signed)
   Primary Cardiologist:No primary care provider on file.  Chart reviewed as part of pre-operative protocol coverage. Because of Fannie Gathright Virag's past medical history and time since last visit, he/she will require a follow-up visit in order to better assess preoperative cardiovascular risk.  Pre-op covering staff: - Please schedule appointment and call patient to inform them. - Please contact requesting surgeon's office via preferred method (i.e, phone, fax) to inform them of need for appointment prior to surgery.  Cecilie Kicks, NP  01/22/2018, 4:24 PM

## 2018-01-26 NOTE — Telephone Encounter (Signed)
PT SCHEDULED  WITH MENGE IN  01-2018

## 2018-02-01 DIAGNOSIS — Z961 Presence of intraocular lens: Secondary | ICD-10-CM | POA: Diagnosis not present

## 2018-02-01 DIAGNOSIS — H35352 Cystoid macular degeneration, left eye: Secondary | ICD-10-CM | POA: Diagnosis not present

## 2018-02-01 DIAGNOSIS — D3132 Benign neoplasm of left choroid: Secondary | ICD-10-CM | POA: Diagnosis not present

## 2018-02-01 DIAGNOSIS — E119 Type 2 diabetes mellitus without complications: Secondary | ICD-10-CM | POA: Diagnosis not present

## 2018-02-10 ENCOUNTER — Encounter: Payer: Self-pay | Admitting: Physician Assistant

## 2018-02-10 ENCOUNTER — Ambulatory Visit (INDEPENDENT_AMBULATORY_CARE_PROVIDER_SITE_OTHER): Payer: Medicare Other | Admitting: Physician Assistant

## 2018-02-10 VITALS — BP 146/62 | HR 50 | Ht 60.0 in | Wt 172.0 lb

## 2018-02-10 DIAGNOSIS — E119 Type 2 diabetes mellitus without complications: Secondary | ICD-10-CM

## 2018-02-10 DIAGNOSIS — Z01818 Encounter for other preprocedural examination: Secondary | ICD-10-CM

## 2018-02-10 DIAGNOSIS — I10 Essential (primary) hypertension: Secondary | ICD-10-CM | POA: Diagnosis not present

## 2018-02-10 DIAGNOSIS — M9903 Segmental and somatic dysfunction of lumbar region: Secondary | ICD-10-CM | POA: Diagnosis not present

## 2018-02-10 DIAGNOSIS — M9901 Segmental and somatic dysfunction of cervical region: Secondary | ICD-10-CM | POA: Diagnosis not present

## 2018-02-10 DIAGNOSIS — I2583 Coronary atherosclerosis due to lipid rich plaque: Secondary | ICD-10-CM

## 2018-02-10 DIAGNOSIS — M9904 Segmental and somatic dysfunction of sacral region: Secondary | ICD-10-CM | POA: Diagnosis not present

## 2018-02-10 DIAGNOSIS — M5430 Sciatica, unspecified side: Secondary | ICD-10-CM | POA: Diagnosis not present

## 2018-02-10 DIAGNOSIS — I2510 Atherosclerotic heart disease of native coronary artery without angina pectoris: Secondary | ICD-10-CM | POA: Diagnosis not present

## 2018-02-10 DIAGNOSIS — T733XXS Exhaustion due to excessive exertion, sequela: Secondary | ICD-10-CM

## 2018-02-10 NOTE — Patient Instructions (Signed)
Medication Instructions:  Your physician recommends that you continue on your current medications as directed. Please refer to the Current Medication list given to you today.  Labwork: None   Testing/Procedures: None   Follow-Up: Your physician wants you to follow-up in: 12 month with Dr Stanford Breed. You will receive a reminder letter in the mail two months in advance. If you don't receive a letter, please call our office to schedule the follow-up appointment.  Any Other Special Instructions Will Be Listed Below (If Applicable). If you need a refill on your cardiac medications before your next appointment, please call your pharmacy.

## 2018-02-10 NOTE — Progress Notes (Signed)
Cardiology Office Note    Date:  02/10/2018   ID:  Elaiza, Shoberg 16-May-1941, MRN [id number]  PCP:  Chipper Herb, MD  Cardiologist:  Dr. Stanford Breed  Chief Complaint  Patient presents with  . Follow-up    seen for Dr. Stanford Breed  . Pre-op Exam    R shoulder surgery by Dr. Esmond Plants.    History of Present Illness:  Misty Smith is a 77 y.o. female with PMH of coronary calcification on previous CT, DM II, history of PVCs, hiatal hernia, hypertension, and lupus.  Patient has been followed by Dr. Stanford Breed mainly for palpitation and hypertension treatment.  Palpitation was felt to be secondary to PVCs.  She had a syncopal episode in April 2010, this was felt to be vasovagal in nature.  Echocardiogram showed normal LV function.  Myoview showed EF 80% with normal perfusion.  Since then, chest CT in April 2015 showed nodules, this is being followed by primary care provider.  It also showed coronary calcification as well.  She was last seen by Dr. Stanford Breed in July 2018, heart rate was 48 at the time, she was continued on metoprolol for rate control therapy.  Patient presents today for preop clearance prior to right shoulder surgery by Dr. Esmond Plants.  She denies ever having chest pain.  She does have some progressive fatigue with heavy exertion however otherwise she is able to take care of herself and do every day activity just fine.  Last week she was at the [employer], she walked around 4-hour outside and another hour inside [employer].  She did not have any exertional chest pain or shortness of breath.  She is somewhat ambiguous about the degree of exertion.  She says she is able to climb up one flight of stairs however may rest before climb up a second flight of stairs.  As far as walking on flat ground, she does have to stop at rest after a while.  This seems to be her baseline.  I discussed the case with DOD Dr. Martinique, her Duke activity status index is 23.45.  Given  lack of symptom, we felt patient is stable enough to proceed with surgery.  However given her prior history and her age, she would be a moderate risk patient going through a moderate risk procedure.  She may hold her aspirin for 7 days prior to the procedure and restart as soon as possible afterward at the discretion of the surgeon.  One of the main concern with her going through the surgery is her bradycardia with anesthesia, anesthesia can potentially slow down her heart rate even further, if needed, she can decrease her beta-blocker to 12.5 mg twice daily of metoprolol to the day prior to the surgery to avoid significant bradycardia during the surgery.   Past Medical History:  Diagnosis Date  . Anxiety   . Arthritis   . Bronchitis   . Cataract   . Colovaginal fistula   . Depression   . Diabetes mellitus without complication ([place])   . Dysrhythmia    palpitations, occasional PVCs; Dr Stanford Breed cardiologist  . Esophageal stricture   . Fractured elbow 1970's  . GERD (gastroesophageal reflux disease)   . H/O hiatal hernia   . Hyperlipidemia   . Hypertension   . Perimenopausal vasomotor symptoms   . Postmenopausal HRT (hormone replacement therapy)   . Pre-diabetes   . Stress   . Subacute lupus erythematosus    Dr. Sol Blazing  Past Surgical History:  Procedure Laterality Date  . abdominal bypass  1987   fibroids   . ABDOMINAL HYSTERECTOMY     partial  . bilateral tubal ligtation  1972  . CHOLECYSTECTOMY N/A 03/21/2014   Procedure: LAPAROSCOPIC CHOLECYSTECTOMY WITH INTRAOPERATIVE CHOLANGIOGRAM;  Surgeon: Shann Medal, MD;  Location: [facility];  Service: General;  Laterality: N/A;  . COLONOSCOPY    . elbow Right 1976   surgery after fracture of elbow  . EYE SURGERY Bilateral    cataracts  . TONSILLECTOMY    . TUBAL LIGATION      Current Medications: Outpatient Medications Prior to Visit  Medication Sig Dispense Refill  . ALPRAZolam (XANAX) 0.25 MG tablet Take 1 tablet (0.25 mg  total) by mouth at bedtime as needed for anxiety. 30 tablet 1  . aspirin EC 81 MG tablet Take 1 tablet (81 mg total) by mouth daily. 90 tablet 3  . atorvastatin (LIPITOR) 80 MG tablet TAKE AS DIRECTED 90 tablet 0  . Calcium Carb-Cholecalciferol (CALTRATE 600+D) 600-800 MG-UNIT TABS Take 1 tablet by mouth every evening.     . cholecalciferol (VITAMIN D) 1000 UNITS tablet Take 2,000 Units by mouth daily.    . clobetasol (TEMOVATE) 0.05 % external solution Apply 1 application topically 2 (two) times daily.    Marland Kitchen desonide (DESOWEN) 0.05 % cream Apply 1 application topically 2 (two) times daily as needed (for rash).     Marland Kitchen escitalopram (LEXAPRO) 20 MG tablet TAKE 1 TABLET (20 MG TOTAL) BY MOUTH DAILY. 90 tablet 1  . esomeprazole (NEXIUM) 40 MG capsule TAKE 1 CAPSULE BY MOUTH EVERY DAY 90 capsule 1  . estradiol (ESTRACE VAGINAL) 0.1 MG/GM vaginal cream Place 2 g vaginally See admin instructions. Place 2 g vaginally 2-3 times weekly. 42.5 g 3  . hydrochlorothiazide (HYDRODIURIL) 12.5 MG tablet Take 1 tablet (12.5 mg total) by mouth daily. 90 tablet 3  . ketorolac (ACULAR) 0.5 % ophthalmic solution PLACE 1 DROP INTO LEFT EYE 4 TIMES DAILY  6  . lisinopril (PRINIVIL,ZESTRIL) 30 MG tablet TAKE 1 TABLET (30 MG TOTAL) BY MOUTH DAILY. 90 tablet 0  . metoprolol tartrate (LOPRESSOR) 25 MG tablet Take 1 tablet (25 mg total) by mouth 2 (two) times daily. 180 tablet 0  . Multiple Vitamin (MULTIVITAMIN) tablet Take 1 tablet by mouth every morning.     Marland Kitchen omega-3 acid ethyl esters (LOVAZA) 1 g capsule TAKE 2 CAPSULES BY MOUTH TWICE A DAY 360 capsule 1  . pioglitazone (ACTOS) 30 MG tablet TAKE 1 TABLET (30 MG TOTAL) BY MOUTH DAILY. 90 tablet 0   No facility-administered medications prior to visit.      Allergies:   Ciprofloxacin; Codeine; Iohexol; Penicillins; Sulfonamide derivatives; Asa [aspirin]; Keflex [cephalexin]; and Morphine and related   Social History   Socioeconomic History  . Marital status: Married     Spouse name: Milbert Coulter  . Number of children: Not on file  . Years of education: Not on file  . Highest education level: Not on file  Occupational History  . Not on file  Social Needs  . Financial resource strain: Not on file  . Food insecurity:    Worry: Not on file    Inability: Not on file  . Transportation needs:    Medical: Not on file    Non-medical: Not on file  Tobacco Use  . Smoking status: Never Smoker  . Smokeless tobacco: Never Used  Substance and Sexual Activity  . Alcohol use: No  Alcohol/week: 0.0 oz  . Drug use: No  . Sexual activity: Never  Lifestyle  . Physical activity:    Days per week: Not on file    Minutes per session: Not on file  . Stress: Not on file  Relationships  . Social connections:    Talks on phone: Not on file    Gets together: Not on file    Attends religious service: Not on file    Active member of club or organization: Not on file    Attends meetings of clubs or organizations: Not on file    Relationship status: Not on file  Other Topics Concern  . Not on file  Social History Narrative  . Not on file     Family History:  The patient's family history includes Breast cancer in her sister; Cancer in her brother, mother, and sister; Coronary artery disease in her father; Heart attack in her sister; Heart disease in her mother and sister; Hyperlipidemia in her sister; Hypertension in her brother, brother, father, mother, sister, and sister; Kidney disease in her father; Stroke in her sister and sister.   ROS:   Please see the history of present illness.    ROS All other systems reviewed and are negative.   PHYSICAL EXAM:   VS:  BP (!) 146/62   Pulse (!) 50   Ht 5' (1.524 m)   Wt 172 lb (78 kg)   BMI 33.59 kg/m    GEN: Well nourished, well developed, in no acute distress  HEENT: normal  Neck: no JVD, carotid bruits, or masses Cardiac: RRR; no murmurs, rubs, or gallops,no edema  Respiratory:  clear to auscultation bilaterally,  normal work of breathing GI: soft, nontender, nondistended, + BS MS: no deformity or atrophy  Skin: warm and dry, no rash Neuro:  Alert and Oriented x 3, Strength and sensation are intact Psych: euthymic mood, full affect  Wt Readings from Last 3 Encounters:  02/10/18 172 lb (78 kg)  01/13/18 170 lb (77.1 kg)  10/30/17 169 lb (76.7 kg)      Studies/Labs Reviewed:   EKG:  EKG is ordered today.  The ekg ordered today demonstrates sinus bradycardia, heart rate 50.  Otherwise poor R wave progression in anterior leads  Recent Labs: 01/05/2018: ALT 33; Hemoglobin 12.5; Platelets 216 01/13/2018: BUN 17; Creatinine, Ser 0.87; Potassium 4.4; Sodium 141   Lipid Panel    Component Value Date/Time   CHOL 149 01/05/2018 1343   TRIG 147 01/05/2018 1343   TRIG 108 07/15/2016 1437   HDL 51 01/05/2018 1343   HDL 53 07/15/2016 1437   CHOLHDL 2.9 01/05/2018 1343   LDLCALC 69 01/05/2018 1343   LDLCALC 62 05/16/2014 0807    Additional studies/ records that were reviewed today include:   Echo 02/20/2009 1. Left ventricle: The cavity size was normal. Systolic function was  normal. The estimated ejection fraction was in the range of 55%  to 65%. 2. Mitral valve: Calcified annulus.    ASSESSMENT:    1. Pre-op examination   2. Fatigue due to excessive exertion, sequela   3. Coronary artery disease due to lipid rich plaque   4. Controlled type 2 diabetes mellitus without complication, without long-term current use of insulin ([place])   5. Essential hypertension      PLAN:  In order of problems listed above:  1. Preoperative clearance prior to right shoulder surgery by Dr. Veverly Fells: Denies any recent chest pain or shortness of breath.  The degree  of maximal activity is unclear, however patient was able to walk around for 2 hours at the [employer] last week.  Her Duke activity risk score is 23.4.  I discussed the case with DOD Dr. Martinique, she is cleared to proceed with  surgery.  She is a moderate risk patient going through a moderate risk procedure.  She may hold her aspirin for 7 days prior to the surgery if needed, although it is ideal for her to stay on aspirin going through the surgery if at all possible.  She will need to restart on aspirin after the surgery.  While the potential concern is further bradycardia on the sedation, she may decrease her metoprolol to 12.5 mg twice daily the day prior to the surgery if needed to avoid significant bradycardia  2. CAD: She never had a cardiac catheterization, however she was found to have coronary calcification on previous CT, therefore this is presumed CAD.  On aspirin the statin  3. DM 2: Managed by primary care provider.  4. Hypertension: Blood pressure slightly improved compared to before.  Given bradycardia, would be hesitant to drop her blood pressure too much    Medication Adjustments/Labs and Tests Ordered: Current medicines are reviewed at length with the patient today.  Concerns regarding medicines are outlined above.  Medication changes, Labs and Tests ordered today are listed in the Patient Instructions below. Patient Instructions  Medication Instructions:  Your physician recommends that you continue on your current medications as directed. Please refer to the Current Medication list given to you today.  Labwork: None   Testing/Procedures: None   Follow-Up: Your physician wants you to follow-up in: 12 month with Dr Stanford Breed. You will receive a reminder letter in the mail two months in advance. If you don't receive a letter, please call our office to schedule the follow-up appointment.  Any Other Special Instructions Will Be Listed Below (If Applicable). If you need a refill on your cardiac medications before your next appointment, please call your pharmacy.     Hilbert Corrigan, [place]  02/10/2018 6:03 PM    [facility] [place], [place],   [address] Phone:  [phone]; Fax: [phone]

## 2018-02-15 DIAGNOSIS — M9904 Segmental and somatic dysfunction of sacral region: Secondary | ICD-10-CM | POA: Diagnosis not present

## 2018-02-15 DIAGNOSIS — M5430 Sciatica, unspecified side: Secondary | ICD-10-CM | POA: Diagnosis not present

## 2018-02-15 DIAGNOSIS — M9903 Segmental and somatic dysfunction of lumbar region: Secondary | ICD-10-CM | POA: Diagnosis not present

## 2018-02-15 DIAGNOSIS — M9901 Segmental and somatic dysfunction of cervical region: Secondary | ICD-10-CM | POA: Diagnosis not present

## 2018-02-17 DIAGNOSIS — M9901 Segmental and somatic dysfunction of cervical region: Secondary | ICD-10-CM | POA: Diagnosis not present

## 2018-02-17 DIAGNOSIS — M9904 Segmental and somatic dysfunction of sacral region: Secondary | ICD-10-CM | POA: Diagnosis not present

## 2018-02-17 DIAGNOSIS — M5430 Sciatica, unspecified side: Secondary | ICD-10-CM | POA: Diagnosis not present

## 2018-02-17 DIAGNOSIS — M9903 Segmental and somatic dysfunction of lumbar region: Secondary | ICD-10-CM | POA: Diagnosis not present

## 2018-02-21 ENCOUNTER — Other Ambulatory Visit: Payer: Self-pay | Admitting: Family Medicine

## 2018-02-22 ENCOUNTER — Other Ambulatory Visit: Payer: Self-pay | Admitting: Family Medicine

## 2018-02-22 DIAGNOSIS — M9901 Segmental and somatic dysfunction of cervical region: Secondary | ICD-10-CM | POA: Diagnosis not present

## 2018-02-22 DIAGNOSIS — M5430 Sciatica, unspecified side: Secondary | ICD-10-CM | POA: Diagnosis not present

## 2018-02-22 DIAGNOSIS — M9903 Segmental and somatic dysfunction of lumbar region: Secondary | ICD-10-CM | POA: Diagnosis not present

## 2018-02-22 DIAGNOSIS — M9904 Segmental and somatic dysfunction of sacral region: Secondary | ICD-10-CM | POA: Diagnosis not present

## 2018-02-25 DIAGNOSIS — M5430 Sciatica, unspecified side: Secondary | ICD-10-CM | POA: Diagnosis not present

## 2018-02-25 DIAGNOSIS — M9903 Segmental and somatic dysfunction of lumbar region: Secondary | ICD-10-CM | POA: Diagnosis not present

## 2018-02-25 DIAGNOSIS — M9901 Segmental and somatic dysfunction of cervical region: Secondary | ICD-10-CM | POA: Diagnosis not present

## 2018-02-25 DIAGNOSIS — M9904 Segmental and somatic dysfunction of sacral region: Secondary | ICD-10-CM | POA: Diagnosis not present

## 2018-03-01 DIAGNOSIS — M9903 Segmental and somatic dysfunction of lumbar region: Secondary | ICD-10-CM | POA: Diagnosis not present

## 2018-03-01 DIAGNOSIS — M9901 Segmental and somatic dysfunction of cervical region: Secondary | ICD-10-CM | POA: Diagnosis not present

## 2018-03-01 DIAGNOSIS — M5430 Sciatica, unspecified side: Secondary | ICD-10-CM | POA: Diagnosis not present

## 2018-03-01 DIAGNOSIS — M9904 Segmental and somatic dysfunction of sacral region: Secondary | ICD-10-CM | POA: Diagnosis not present

## 2018-03-03 DIAGNOSIS — M9903 Segmental and somatic dysfunction of lumbar region: Secondary | ICD-10-CM | POA: Diagnosis not present

## 2018-03-03 DIAGNOSIS — M5430 Sciatica, unspecified side: Secondary | ICD-10-CM | POA: Diagnosis not present

## 2018-03-03 DIAGNOSIS — M9904 Segmental and somatic dysfunction of sacral region: Secondary | ICD-10-CM | POA: Diagnosis not present

## 2018-03-03 DIAGNOSIS — M9901 Segmental and somatic dysfunction of cervical region: Secondary | ICD-10-CM | POA: Diagnosis not present

## 2018-03-08 DIAGNOSIS — M9904 Segmental and somatic dysfunction of sacral region: Secondary | ICD-10-CM | POA: Diagnosis not present

## 2018-03-08 DIAGNOSIS — M9903 Segmental and somatic dysfunction of lumbar region: Secondary | ICD-10-CM | POA: Diagnosis not present

## 2018-03-08 DIAGNOSIS — M5430 Sciatica, unspecified side: Secondary | ICD-10-CM | POA: Diagnosis not present

## 2018-03-08 DIAGNOSIS — M9901 Segmental and somatic dysfunction of cervical region: Secondary | ICD-10-CM | POA: Diagnosis not present

## 2018-03-11 DIAGNOSIS — M9904 Segmental and somatic dysfunction of sacral region: Secondary | ICD-10-CM | POA: Diagnosis not present

## 2018-03-11 DIAGNOSIS — M9903 Segmental and somatic dysfunction of lumbar region: Secondary | ICD-10-CM | POA: Diagnosis not present

## 2018-03-11 DIAGNOSIS — M9901 Segmental and somatic dysfunction of cervical region: Secondary | ICD-10-CM | POA: Diagnosis not present

## 2018-03-11 DIAGNOSIS — M5430 Sciatica, unspecified side: Secondary | ICD-10-CM | POA: Diagnosis not present

## 2018-03-15 DIAGNOSIS — E119 Type 2 diabetes mellitus without complications: Secondary | ICD-10-CM | POA: Diagnosis not present

## 2018-03-15 DIAGNOSIS — D3132 Benign neoplasm of left choroid: Secondary | ICD-10-CM | POA: Diagnosis not present

## 2018-03-15 DIAGNOSIS — H35352 Cystoid macular degeneration, left eye: Secondary | ICD-10-CM | POA: Diagnosis not present

## 2018-03-15 DIAGNOSIS — H3562 Retinal hemorrhage, left eye: Secondary | ICD-10-CM | POA: Diagnosis not present

## 2018-03-16 DIAGNOSIS — M9903 Segmental and somatic dysfunction of lumbar region: Secondary | ICD-10-CM | POA: Diagnosis not present

## 2018-03-16 DIAGNOSIS — M9904 Segmental and somatic dysfunction of sacral region: Secondary | ICD-10-CM | POA: Diagnosis not present

## 2018-03-16 DIAGNOSIS — M9901 Segmental and somatic dysfunction of cervical region: Secondary | ICD-10-CM | POA: Diagnosis not present

## 2018-03-16 DIAGNOSIS — M5430 Sciatica, unspecified side: Secondary | ICD-10-CM | POA: Diagnosis not present

## 2018-03-18 ENCOUNTER — Encounter: Payer: Self-pay | Admitting: Family Medicine

## 2018-03-18 DIAGNOSIS — L930 Discoid lupus erythematosus: Secondary | ICD-10-CM | POA: Diagnosis not present

## 2018-03-18 DIAGNOSIS — L814 Other melanin hyperpigmentation: Secondary | ICD-10-CM | POA: Diagnosis not present

## 2018-03-18 DIAGNOSIS — L570 Actinic keratosis: Secondary | ICD-10-CM | POA: Diagnosis not present

## 2018-03-18 DIAGNOSIS — D485 Neoplasm of uncertain behavior of skin: Secondary | ICD-10-CM | POA: Diagnosis not present

## 2018-03-18 DIAGNOSIS — L821 Other seborrheic keratosis: Secondary | ICD-10-CM | POA: Diagnosis not present

## 2018-03-18 DIAGNOSIS — D225 Melanocytic nevi of trunk: Secondary | ICD-10-CM | POA: Diagnosis not present

## 2018-03-18 DIAGNOSIS — D1801 Hemangioma of skin and subcutaneous tissue: Secondary | ICD-10-CM | POA: Diagnosis not present

## 2018-03-18 DIAGNOSIS — L820 Inflamed seborrheic keratosis: Secondary | ICD-10-CM | POA: Diagnosis not present

## 2018-03-18 DIAGNOSIS — L989 Disorder of the skin and subcutaneous tissue, unspecified: Secondary | ICD-10-CM | POA: Diagnosis not present

## 2018-03-24 DIAGNOSIS — M9904 Segmental and somatic dysfunction of sacral region: Secondary | ICD-10-CM | POA: Diagnosis not present

## 2018-03-24 DIAGNOSIS — M9903 Segmental and somatic dysfunction of lumbar region: Secondary | ICD-10-CM | POA: Diagnosis not present

## 2018-03-24 DIAGNOSIS — M62830 Muscle spasm of back: Secondary | ICD-10-CM | POA: Diagnosis not present

## 2018-03-24 DIAGNOSIS — M9901 Segmental and somatic dysfunction of cervical region: Secondary | ICD-10-CM | POA: Diagnosis not present

## 2018-03-29 DIAGNOSIS — Y999 Unspecified external cause status: Secondary | ICD-10-CM | POA: Diagnosis not present

## 2018-03-29 DIAGNOSIS — M19011 Primary osteoarthritis, right shoulder: Secondary | ICD-10-CM | POA: Diagnosis not present

## 2018-03-29 DIAGNOSIS — S46211A Strain of muscle, fascia and tendon of other parts of biceps, right arm, initial encounter: Secondary | ICD-10-CM | POA: Diagnosis not present

## 2018-03-29 DIAGNOSIS — S46011A Strain of muscle(s) and tendon(s) of the rotator cuff of right shoulder, initial encounter: Secondary | ICD-10-CM | POA: Diagnosis not present

## 2018-03-29 DIAGNOSIS — X58XXXA Exposure to other specified factors, initial encounter: Secondary | ICD-10-CM | POA: Diagnosis not present

## 2018-03-29 DIAGNOSIS — G8918 Other acute postprocedural pain: Secondary | ICD-10-CM | POA: Diagnosis not present

## 2018-03-29 DIAGNOSIS — M75121 Complete rotator cuff tear or rupture of right shoulder, not specified as traumatic: Secondary | ICD-10-CM | POA: Diagnosis not present

## 2018-03-29 DIAGNOSIS — S43431A Superior glenoid labrum lesion of right shoulder, initial encounter: Secondary | ICD-10-CM | POA: Diagnosis not present

## 2018-03-29 DIAGNOSIS — M11211 Other chondrocalcinosis, right shoulder: Secondary | ICD-10-CM | POA: Diagnosis not present

## 2018-03-29 HISTORY — PX: ROTATOR CUFF REPAIR: SHX139

## 2018-04-01 ENCOUNTER — Encounter: Payer: Self-pay | Admitting: Physical Therapy

## 2018-04-01 ENCOUNTER — Ambulatory Visit: Payer: Medicare Other | Attending: Orthopedic Surgery | Admitting: Physical Therapy

## 2018-04-01 ENCOUNTER — Other Ambulatory Visit: Payer: Self-pay

## 2018-04-01 DIAGNOSIS — M25511 Pain in right shoulder: Secondary | ICD-10-CM | POA: Diagnosis not present

## 2018-04-01 DIAGNOSIS — G8929 Other chronic pain: Secondary | ICD-10-CM | POA: Insufficient documentation

## 2018-04-01 DIAGNOSIS — M25611 Stiffness of right shoulder, not elsewhere classified: Secondary | ICD-10-CM | POA: Diagnosis not present

## 2018-04-01 NOTE — Therapy (Signed)
[facility] [place], [place], [address] Phone: [phone]   Fax:  [phone]  Physical Therapy Evaluation  Patient Details  Name: Misty Smith MRN: [id number] Date of Birth: 12/12/1940 Referring Provider: Esmond Plants MD   Encounter Date: 04/01/2018  PT End of Session - 04/01/18 1219    Visit Number  1    Number of Visits  16    Date for PT Re-Evaluation  06/30/18    PT Start Time  1104    PT Stop Time  1153    PT Time Calculation (min)  49 min    Activity Tolerance  Patient tolerated treatment well    Behavior During Therapy  [facility] for tasks assessed/performed       Past Medical History:  Diagnosis Date  . Anxiety   . Arthritis   . Bronchitis   . Cataract   . Colovaginal fistula   . Depression   . Diabetes mellitus without complication ([place])   . Dysrhythmia    palpitations, occasional PVCs; Dr Stanford Breed cardiologist  . Esophageal stricture   . Fractured elbow 1970's  . GERD (gastroesophageal reflux disease)   . H/O hiatal hernia   . Hyperlipidemia   . Hypertension   . Perimenopausal vasomotor symptoms   . Postmenopausal HRT (hormone replacement therapy)   . Pre-diabetes   . Stress   . Subacute lupus erythematosus    Dr. Sol Blazing      Past Surgical History:  Procedure Laterality Date  . abdominal bypass  1987   fibroids   . ABDOMINAL HYSTERECTOMY     partial  . bilateral tubal ligtation  1972  . CHOLECYSTECTOMY N/A 03/21/2014   Procedure: LAPAROSCOPIC CHOLECYSTECTOMY WITH INTRAOPERATIVE CHOLANGIOGRAM;  Surgeon: Shann Medal, MD;  Location: [facility];  Service: General;  Laterality: N/A;  . COLONOSCOPY    . elbow Right 1976   surgery after fracture of elbow  . EYE SURGERY Bilateral    cataracts  . TONSILLECTOMY    . TUBAL LIGATION      There were no vitals filed for this visit.   Subjective Assessment - 04/01/18 1231    Subjective  The patient is 3 days post-op right RCR repair surgery.  She  is in a sling with an abduction pillow.  She has been compliant to a HEP.  Her pain is a low 2/10 at rest in her sling but rises greatly wwith movement out of sling.      Patient is accompained by:  Family member Son.    Pertinent History  DM; Right elbow fracture.    Patient Stated Goals  Use right UE again.    Currently in Pain?  Yes    Pain Score  2     Pain Location  Shoulder    Pain Orientation  Right    Pain Descriptors / Indicators  Dull;Aching    Pain Type  Surgical pain    Pain Onset  In the past 7 days    Pain Frequency  Constant    Aggravating Factors   See above.    Pain Relieving Factors  See above.         [facility] PT Assessment - 04/01/18 0001      Assessment   Medical Diagnosis  Right RCR.    Referring Provider  Esmond Plants MD    Onset Date/Surgical Date  -- 03/29/18 (surgery date).      Precautions   Precautions  -- See MD order...begin  with gentle RT PAROM.      Restrictions   Weight Bearing Restrictions  No      Balance Screen   Has the patient fallen in the past 6 months  No    Has the patient had a decrease in activity level because of a fear of falling?   No    Is the patient reluctant to leave their home because of a fear of falling?   No      Home Environment   Living Environment  Private residence      Prior Function   Level of Independence  Independent      Observation/Other Assessments   Observations  Pos-surgical dressing intact over patient's right shoulder.  Expected ecchymosis observec.      Posture/Postural Control   Posture/Postural Control  -- Guarded.      ROM / Strength   AROM / PROM / Strength  PROM      PROM   Overall PROM Comments  PAROM into right shoulder flexion= 65 degrees and ER= 11 degrees.      Palpation   Palpation comment  Patient currently c/o diffuse right shoulder pain.        Ambulation/Gait   Gait Comments  WNL.                Objective measurements completed on examination: See above findings.       [facility] PT Treatment/Exercise - 04/01/18 0001      Modalities   Modalities  Vasopneumatic      Vasopneumatic   Number Minutes Vasopneumatic   15 minutes    Vasopnuematic Location   -- RT shld with pillow between elbow and thorax.    Vasopneumatic Pressure  Low      Manual Therapy   Manual Therapy  Passive ROM    Passive ROM  In supine:  Gentle PAROM x 8 minutes to patient's right shoulder into flexion and ER.               PT Short Term Goals - 04/01/18 1328      PT SHORT TERM GOAL #1   Title  STG's=LTG's.        PT Long Term Goals - 04/01/18 1329      PT LONG TERM GOAL #1   Title  Independent with a HEP.    Time  6    Period  Weeks    Status  New      PT LONG TERM GOAL #2   Title  Active right shoulder flexion to 145 degrees so the patient can easily reach overhead.    Time  6    Period  Weeks    Status  New      PT LONG TERM GOAL #3   Title  Active ER to 70 degrees+ to allow for easily donning/doffing of apparel.    Time  6    Period  Weeks    Status  New      PT LONG TERM GOAL #4   Title  Increase right shoulder strength to a solid 4/5 to increase stability for performance of functional activities.    Time  6    Period  Weeks    Status  New      PT LONG TERM GOAL #5   Title  Increase ROM so patient is able to reach behind back to L3.    Time  6    Period  Weeks  Status  New      Additional Long Term Goals   Additional Long Term Goals  Yes      PT LONG TERM GOAL #6   Title  Perform ADL's with pain not > 3/10.    Time  6    Period  Weeks    Status  New             Plan - 04/01/18 1316    Clinical Impression Statement  The patient presents to OPPT s/p right RTC repair surgery 3 days ago.  She has  a loss of range of motion as expected.  She is compliant to her HEP and sling (with abduction pillow) usage.  She has a loss of functional use of her right UE.  Patient expected to do well with skilled physical therapy  intervention.    History and Personal Factors relevant to plan of care:  DM; Right elbow fracture.    Clinical Presentation  Stable    Clinical Presentation due to:  Good surgical outcome.    Clinical Decision Making  Low    PT Frequency  2x / week    PT Duration  6 weeks    PT Treatment/Interventions  ADLs/Self Care Home Management;Cryotherapy;Electrical Stimulation;Ultrasound;Therapeutic exercise;Therapeutic activities;Patient/family education;Passive range of motion;Manual techniques;Vasopneumatic Device    PT Next Visit Plan  Begin with gentle PAROM to patient's right shoulder.  Instruct in cane exercise (gentle) to increase ER.  Vasopneumatic on low (plillow between right elbow and thorax.    Consulted and Agree with Plan of Care  Patient       Patient will benefit from skilled therapeutic intervention in order to improve the following deficits and impairments:  Decreased activity tolerance, Decreased range of motion, Pain  Visit Diagnosis: Chronic right shoulder pain - Plan: PT plan of care cert/re-cert  Stiffness of right shoulder, not elsewhere classified - Plan: PT plan of care cert/re-cert     Problem List Patient Active Problem List   Diagnosis Date Noted  . Coronary artery disease due to lipid rich plaque 03/18/2016  . Anxiety state 03/18/2016  . Vitamin D deficiency 03/18/2016  . 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 CAD (coronary artery disease) 12/30/2013  . 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 Osteopenia of the elderly 10/26/2013  . Generalized anxiety disorder 09/21/2013  . GERD (gastroesophageal reflux disease) 09/21/2013  . Pulmonary nodule seen on imaging study 09/21/2013  . SYNCOPE 02/02/2009  . Pre-diabetes 02/01/2009  . Hyperlipidemia 02/01/2009  . Essential hypertension 02/01/2009  . DIZZINESS 02/01/2009  . PALPITATIONS 02/01/2009    Haely Leyland, [place] MPT 04/01/2018, 1:42 PM  [facility] [place], [place], [address] Phone: [phone]   Fax:  [phone]  Name: Misty Smith MRN: [id number] Date of Birth: [birth year]

## 2018-04-05 ENCOUNTER — Encounter: Payer: Self-pay | Admitting: Physical Therapy

## 2018-04-05 ENCOUNTER — Ambulatory Visit: Payer: Medicare Other | Admitting: Physical Therapy

## 2018-04-05 DIAGNOSIS — G8929 Other chronic pain: Secondary | ICD-10-CM

## 2018-04-05 DIAGNOSIS — M25511 Pain in right shoulder: Principal | ICD-10-CM

## 2018-04-05 DIAGNOSIS — M25611 Stiffness of right shoulder, not elsewhere classified: Secondary | ICD-10-CM | POA: Diagnosis not present

## 2018-04-05 NOTE — Therapy (Signed)
[facility] [place], [place], [address] Phone: [phone]   Fax:  [phone]  Physical Therapy Treatment  Patient Details  Name: Misty Smith MRN: [id number] Date of Birth: 10/28/1940 Referring Provider: Esmond Plants MD   Encounter Date: 04/05/2018  PT End of Session - 04/05/18 0904    Visit Number  2    Number of Visits  16    Date for PT Re-Evaluation  06/30/18    PT Start Time  0904    PT Stop Time  0952    PT Time Calculation (min)  48 min    Activity Tolerance  Patient tolerated treatment well    Behavior During Therapy  [facility] for tasks assessed/performed       Past Medical History:  Diagnosis Date  . Anxiety   . Arthritis   . Bronchitis   . Cataract   . Colovaginal fistula   . Depression   . Diabetes mellitus without complication ([place])   . Dysrhythmia    palpitations, occasional PVCs; Dr Stanford Breed cardiologist  . Esophageal stricture   . Fractured elbow 1970's  . GERD (gastroesophageal reflux disease)   . H/O hiatal hernia   . Hyperlipidemia   . Hypertension   . Perimenopausal vasomotor symptoms   . Postmenopausal HRT (hormone replacement therapy)   . Pre-diabetes   . Stress   . Subacute lupus erythematosus    Dr. Sol Blazing      Past Surgical History:  Procedure Laterality Date  . abdominal bypass  1987   fibroids   . ABDOMINAL HYSTERECTOMY     partial  . bilateral tubal ligtation  1972  . CHOLECYSTECTOMY N/A 03/21/2014   Procedure: LAPAROSCOPIC CHOLECYSTECTOMY WITH INTRAOPERATIVE CHOLANGIOGRAM;  Surgeon: Shann Medal, MD;  Location: [facility];  Service: General;  Laterality: N/A;  . COLONOSCOPY    . elbow Right 1976   surgery after fracture of elbow  . EYE SURGERY Bilateral    cataracts  . TONSILLECTOMY    . TUBAL LIGATION      There were no vitals filed for this visit.  Subjective Assessment - 04/05/18 0858    Subjective  Reports unable to sleep well due to sleeping in a chair that does  not recline.     Patient is accompained by:  Family member Son    Pertinent History  DM; Right elbow fracture.    Patient Stated Goals  Use right UE again.    Currently in Pain?  Yes    Pain Score  4     Pain Location  Shoulder    Pain Orientation  Right    Pain Descriptors / Indicators  Aching;Tightness    Pain Type  Surgical pain    Pain Onset  In the past 7 days         [facility] PT Assessment - 04/05/18 0001      Assessment   Medical Diagnosis  Right RCR.    Onset Date/Surgical Date  03/29/18    Next MD Visit  04/08/2018      Restrictions   Weight Bearing Restrictions  No                   [facility] Adult PT Treatment/Exercise - 04/05/18 0001      Modalities   Modalities  Vasopneumatic      Vasopneumatic   Number Minutes Vasopneumatic   15 minutes    Vasopnuematic Location   Shoulder    Vasopneumatic Pressure  Low    Vasopneumatic Temperature   34      Manual Therapy   Manual Therapy  Passive ROM    Passive ROM  Gentle PROM of R shoulder into flex, ER, IR with oscillations and gentle holds at end range               PT Short Term Goals - 04/01/18 1328      PT SHORT TERM GOAL #1   Title  STG's=LTG's.        PT Long Term Goals - 04/01/18 1329      PT LONG TERM GOAL #1   Title  Independent with a HEP.    Time  6    Period  Weeks    Status  New      PT LONG TERM GOAL #2   Title  Active right shoulder flexion to 145 degrees so the patient can easily reach overhead.    Time  6    Period  Weeks    Status  New      PT LONG TERM GOAL #3   Title  Active ER to 70 degrees+ to allow for easily donning/doffing of apparel.    Time  6    Period  Weeks    Status  New      PT LONG TERM GOAL #4   Title  Increase right shoulder strength to a solid 4/5 to increase stability for performance of functional activities.    Time  6    Period  Weeks    Status  New      PT LONG TERM GOAL #5   Title  Increase ROM so patient is able to reach behind back to  L3.    Time  6    Period  Weeks    Status  New      Additional Long Term Goals   Additional Long Term Goals  Yes      PT LONG TERM GOAL #6   Title  Perform ADL's with pain not > 3/10.    Time  6    Period  Weeks    Status  New            Plan - 04/05/18 0944    Clinical Impression Statement  Patient tolerated today's treatment fairly well as she is still very early post-surgery. Patient presented in clinic with sling donned although she reported not completely secured due to taking it off for PT again. Patient initially very limited with ROM with oscillations and VCs for relaxation provided. Firm end feels and smooth arc of motion noted in all directions of PROM. Normal modalities response noted following removal of the modalities.    PT Frequency  2x / week    PT Duration  6 weeks    PT Treatment/Interventions  ADLs/Self Care Home Management;Cryotherapy;Electrical Stimulation;Ultrasound;Therapeutic exercise;Therapeutic activities;Patient/family education;Passive range of motion;Manual techniques;Vasopneumatic Device    PT Next Visit Plan  Begin with gentle PAROM to patient's right shoulder.  Instruct in cane exercise (gentle) to increase ER.  Vasopneumatic on low (plillow between right elbow and thorax.    Consulted and Agree with Plan of Care  Patient       Patient will benefit from skilled therapeutic intervention in order to improve the following deficits and impairments:  Decreased activity tolerance, Decreased range of motion, Pain  Visit Diagnosis: Chronic right shoulder pain  Stiffness of right shoulder, not elsewhere classified     Problem List Patient Active  Problem List   Diagnosis Date Noted  . Coronary artery disease due to lipid rich plaque 03/18/2016  . Anxiety state 03/18/2016  . Vitamin D deficiency 03/18/2016  . 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 CAD (coronary artery disease) 12/30/2013  . 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 Osteopenia of  the elderly 10/26/2013  . Generalized anxiety disorder 09/21/2013  . GERD (gastroesophageal reflux disease) 09/21/2013  . Pulmonary nodule seen on imaging study 09/21/2013  . SYNCOPE 02/02/2009  . Pre-diabetes 02/01/2009  . Hyperlipidemia 02/01/2009  . Essential hypertension 02/01/2009  . DIZZINESS 02/01/2009  . PALPITATIONS 02/01/2009    Standley Brooking, PTA 04/05/2018, 9:56 AM  [facility] [address] [place], [place], [address] Phone: [phone]   Fax:  [phone]  Name: Misty Smith MRN: [id number] Date of Birth: [birth year]

## 2018-04-07 ENCOUNTER — Ambulatory Visit: Payer: Medicare Other | Admitting: Physical Therapy

## 2018-04-07 ENCOUNTER — Encounter: Payer: Self-pay | Admitting: Physical Therapy

## 2018-04-07 DIAGNOSIS — M25611 Stiffness of right shoulder, not elsewhere classified: Secondary | ICD-10-CM | POA: Diagnosis not present

## 2018-04-07 DIAGNOSIS — M25511 Pain in right shoulder: Secondary | ICD-10-CM | POA: Diagnosis not present

## 2018-04-07 DIAGNOSIS — G8929 Other chronic pain: Secondary | ICD-10-CM

## 2018-04-07 NOTE — Therapy (Signed)
[facility] [place], [place], [address] Phone: [phone]   Fax:  [phone]  Physical Therapy Treatment  Patient Details  Name: Misty Smith MRN: [id number] Date of Birth: 11-19-1940 Referring Provider: Esmond Plants MD   Encounter Date: 04/07/2018  PT End of Session - 04/07/18 0903    Visit Number  3    Number of Visits  16    Date for PT Re-Evaluation  06/30/18    PT Start Time  0904    PT Stop Time  0958    PT Time Calculation (min)  54 min    Activity Tolerance  Patient tolerated treatment well    Behavior During Therapy  [facility] for tasks assessed/performed       Past Medical History:  Diagnosis Date  . Anxiety   . Arthritis   . Bronchitis   . Cataract   . Colovaginal fistula   . Depression   . Diabetes mellitus without complication ([place])   . Dysrhythmia    palpitations, occasional PVCs; Dr Stanford Breed cardiologist  . Esophageal stricture   . Fractured elbow 1970's  . GERD (gastroesophageal reflux disease)   . H/O hiatal hernia   . Hyperlipidemia   . Hypertension   . Perimenopausal vasomotor symptoms   . Postmenopausal HRT (hormone replacement therapy)   . Pre-diabetes   . Stress   . Subacute lupus erythematosus    Dr. Sol Blazing      Past Surgical History:  Procedure Laterality Date  . abdominal bypass  1987   fibroids   . ABDOMINAL HYSTERECTOMY     partial  . bilateral tubal ligtation  1972  . CHOLECYSTECTOMY N/A 03/21/2014   Procedure: LAPAROSCOPIC CHOLECYSTECTOMY WITH INTRAOPERATIVE CHOLANGIOGRAM;  Surgeon: Shann Medal, MD;  Location: [facility];  Service: General;  Laterality: N/A;  . COLONOSCOPY    . elbow Right 1976   surgery after fracture of elbow  . EYE SURGERY Bilateral    cataracts  . TONSILLECTOMY    . TUBAL LIGATION      There were no vitals filed for this visit.  Subjective Assessment - 04/07/18 0903    Subjective  Reports pain only with movement such as dressing.    Patient is  accompained by:  Misty Smith    Pertinent History  DM; Right elbow fracture.    Patient Stated Goals  Use right UE again.    Currently in Pain?  No/denies         [facility] PT Assessment - 04/07/18 0001      Assessment   Medical Diagnosis  Right RCR.    Onset Date/Surgical Date  03/29/18    Next MD Visit  04/08/2018      Restrictions   Weight Bearing Restrictions  No                   [facility] Adult PT Treatment/Exercise - 04/07/18 0001      Exercises   Exercises  Shoulder      Modalities   Modalities  Vasopneumatic      Vasopneumatic   Number Minutes Vasopneumatic   15 minutes    Vasopnuematic Location   Shoulder    Vasopneumatic Pressure  Low    Vasopneumatic Temperature   34      Manual Therapy   Manual Therapy  Passive ROM    Passive ROM  Gentle PROM of R shoulder into flex, ER, IR with oscillations and gentle holds at end range  PT Short Term Goals - 04/01/18 1328      PT SHORT TERM GOAL #1   Title  STG's=LTG's.        PT Long Term Goals - 04/01/18 1329      PT LONG TERM GOAL #1   Title  Independent with a HEP.    Time  6    Period  Weeks    Status  New      PT LONG TERM GOAL #2   Title  Active right shoulder flexion to 145 degrees so the patient can easily reach overhead.    Time  6    Period  Weeks    Status  New      PT LONG TERM GOAL #3   Title  Active ER to 70 degrees+ to allow for easily donning/doffing of apparel.    Time  6    Period  Weeks    Status  New      PT LONG TERM GOAL #4   Title  Increase right shoulder strength to a solid 4/5 to increase stability for performance of functional activities.    Time  6    Period  Weeks    Status  New      PT LONG TERM GOAL #5   Title  Increase ROM so patient is able to reach behind back to L3.    Time  6    Period  Weeks    Status  New      Additional Long Term Goals   Additional Long Term Goals  Yes      PT LONG TERM GOAL #6   Title  Perform ADL's  with pain not > 3/10.    Time  6    Period  Weeks    Status  New            Plan - 04/07/18 1012    Clinical Impression Statement  Patient tolerated today's treatment fairly well today although she is still experiencing greater discomfort with eccentric return of R shoulder from PROM.  Gentle PROM of R shoulder completed as patient still experiences minimal discomfort at end range. Frequent oscillations of RUE provided to reduce muscle guarding and pain. Normal modalities response noted following removal of the modalities.     PT Frequency  2x / week    PT Duration  6 weeks    PT Treatment/Interventions  ADLs/Self Care Home Management;Cryotherapy;Electrical Stimulation;Ultrasound;Therapeutic exercise;Therapeutic activities;Patient/Misty education;Passive range of motion;Manual techniques;Vasopneumatic Device    PT Next Visit Plan  Begin with gentle PAROM to patient's right shoulder.  Instruct in cane exercise (gentle) to increase ER.  Vasopneumatic on low (plillow between right elbow and thorax.    Consulted and Agree with Plan of Care  Patient       Patient will benefit from skilled therapeutic intervention in order to improve the following deficits and impairments:  Decreased activity tolerance, Decreased range of motion, Pain  Visit Diagnosis: Chronic right shoulder pain  Stiffness of right shoulder, not elsewhere classified     Problem List Patient Active Problem List   Diagnosis Date Noted  . Coronary artery disease due to lipid rich plaque 03/18/2016  . Anxiety state 03/18/2016  . Vitamin D deficiency 03/18/2016  . 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 CAD (coronary artery disease) 12/30/2013  . 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 Osteopenia of the elderly 10/26/2013  . Generalized anxiety disorder 09/21/2013  . GERD (gastroesophageal reflux disease) 09/21/2013  . Pulmonary  nodule seen on imaging study 09/21/2013  . SYNCOPE 02/02/2009  . Pre-diabetes  02/01/2009  . Hyperlipidemia 02/01/2009  . Essential hypertension 02/01/2009  . DIZZINESS 02/01/2009  . PALPITATIONS 02/01/2009    Standley Brooking, PTA 04/07/2018, 10:27 AM  [facility] [address] [place], [place], [address] Phone: [phone]   Fax:  [phone]  Name: Misty Smith MRN: [id number] Date of Birth: 06/28/1941

## 2018-04-09 ENCOUNTER — Encounter: Payer: Medicare Other | Admitting: Physical Therapy

## 2018-04-12 ENCOUNTER — Ambulatory Visit: Payer: Medicare Other | Admitting: Physical Therapy

## 2018-04-12 ENCOUNTER — Encounter: Payer: Self-pay | Admitting: Physical Therapy

## 2018-04-12 ENCOUNTER — Telehealth: Payer: Self-pay | Admitting: Family Medicine

## 2018-04-12 DIAGNOSIS — G8929 Other chronic pain: Secondary | ICD-10-CM | POA: Diagnosis not present

## 2018-04-12 DIAGNOSIS — M25511 Pain in right shoulder: Principal | ICD-10-CM

## 2018-04-12 DIAGNOSIS — M25611 Stiffness of right shoulder, not elsewhere classified: Secondary | ICD-10-CM

## 2018-04-12 NOTE — Therapy (Signed)
[facility] [place], [place], [address] Phone: [phone]   Fax:  [phone]  Physical Therapy Treatment  Patient Details  Name: Misty Smith MRN: [id number] Date of Birth: 07-08-1941 Referring Provider: Esmond Plants MD   Encounter Date: 04/12/2018  PT End of Session - 04/12/18 1111    Visit Number  4    Number of Visits  16    Date for PT Re-Evaluation  06/30/18    PT Start Time  1031    PT Stop Time  1116    PT Time Calculation (min)  45 min    Activity Tolerance  Patient tolerated treatment well    Behavior During Therapy  [facility] for tasks assessed/performed       Past Medical History:  Diagnosis Date  . Anxiety   . Arthritis   . Bronchitis   . Cataract   . Colovaginal fistula   . Depression   . Diabetes mellitus without complication ([place])   . Dysrhythmia    palpitations, occasional PVCs; Dr Stanford Breed cardiologist  . Esophageal stricture   . Fractured elbow 1970's  . GERD (gastroesophageal reflux disease)   . H/O hiatal hernia   . Hyperlipidemia   . Hypertension   . Perimenopausal vasomotor symptoms   . Postmenopausal HRT (hormone replacement therapy)   . Pre-diabetes   . Stress   . Subacute lupus erythematosus    Dr. Sol Blazing      Past Surgical History:  Procedure Laterality Date  . abdominal bypass  1987   fibroids   . ABDOMINAL HYSTERECTOMY     partial  . bilateral tubal ligtation  1972  . CHOLECYSTECTOMY N/A 03/21/2014   Procedure: LAPAROSCOPIC CHOLECYSTECTOMY WITH INTRAOPERATIVE CHOLANGIOGRAM;  Surgeon: Shann Medal, MD;  Location: [facility];  Service: General;  Laterality: N/A;  . COLONOSCOPY    . elbow Right 1976   surgery after fracture of elbow  . EYE SURGERY Bilateral    cataracts  . TONSILLECTOMY    . TUBAL LIGATION      There were no vitals filed for this visit.  Subjective Assessment - 04/12/18 1034    Subjective  Patient went to see MD and is doing well and is to continue  wearing the sling until next F/U with MD see MD orders 2x for 4 weeks AAROM and gentle isometrics    Patient is accompained by:  Family member    Pertinent History  DM; Right elbow fracture.    Patient Stated Goals  Use right UE again.    Currently in Pain?  Yes    Pain Score  1     Pain Location  Shoulder    Pain Orientation  Right    Pain Descriptors / Indicators  Aching;Tightness    Pain Type  Surgical pain    Pain Onset  1 to 4 weeks ago    Pain Frequency  Intermittent    Aggravating Factors   movement    Pain Relieving Factors  at rest                       [facility] Adult PT Treatment/Exercise - 04/12/18 0001      Exercises   Exercises  Shoulder      Shoulder Exercises: Supine   Other Supine Exercises  supine cane AAROM 2x10      Shoulder Exercises: Isometric Strengthening   Flexion  5X10"    Flexion Limitations  standing (gentle push)  Extension  5X10"    Extension Limitations  standing (gentle push)    External Rotation  5X10"    External Rotation Limitations  standing (gentle push)    Internal Rotation  5X10"    Internal Rotation Limitations  standing (gentle push)      Modalities   Modalities  Electrical Stimulation;Vasopneumatic      Electrical Stimulation   Electrical Stimulation Location  right shoulder    Electrical Stimulation Action  IFC 1-10hz  x59min    Electrical Stimulation Goals  Pain      Vasopneumatic   Number Minutes Vasopneumatic   15 minutes    Vasopnuematic Location   Shoulder    Vasopneumatic Pressure  Low    Vasopneumatic Temperature   34      Manual Therapy   Manual Therapy  Passive ROM    Passive ROM  Gentle P/AAROM of R shoulder into flex, ER, IR with oscillations and gentle holds at end range               PT Short Term Goals - 04/01/18 1328      PT SHORT TERM GOAL #1   Title  STG's=LTG's.        PT Long Term Goals - 04/01/18 1329      PT LONG TERM GOAL #1   Title  Independent with a HEP.    Time   6    Period  Weeks    Status  New      PT LONG TERM GOAL #2   Title  Active right shoulder flexion to 145 degrees so the patient can easily reach overhead.    Time  6    Period  Weeks    Status  New      PT LONG TERM GOAL #3   Title  Active ER to 70 degrees+ to allow for easily donning/doffing of apparel.    Time  6    Period  Weeks    Status  New      PT LONG TERM GOAL #4   Title  Increase right shoulder strength to a solid 4/5 to increase stability for performance of functional activities.    Time  6    Period  Weeks    Status  New      PT LONG TERM GOAL #5   Title  Increase ROM so patient is able to reach behind back to L3.    Time  6    Period  Weeks    Status  New      Additional Long Term Goals   Additional Long Term Goals  Yes      PT LONG TERM GOAL #6   Title  Perform ADL's with pain not > 3/10.    Time  6    Period  Weeks    Status  New            Plan - 04/12/18 1106    Clinical Impression Statement  Patient tolerated treatment well today. Patient has very little discomfort overall. Patient has improved ROM in right shoulder for flexion and ER. Patient has minimal discomfort end range. Patient educated and performed isometrics today per MD order, good technique. Patient goals ongoing.     PT Frequency  2x / week    PT Duration  6 weeks    PT Treatment/Interventions  ADLs/Self Care Home Management;Cryotherapy;Electrical Stimulation;Ultrasound;Therapeutic exercise;Therapeutic activities;Patient/family education;Passive range of motion;Manual techniques;Vasopneumatic Device    PT Next Visit Plan  cont with POC for  PAROM to patient's right shoulder NO for AAROM and gentle isometrics Vasopneumatic on low (plillow between right elbow and thorax.    Consulted and Agree with Plan of Care  Patient       Patient will benefit from skilled therapeutic intervention in order to improve the following deficits and impairments:  Decreased activity tolerance, Decreased  range of motion, Pain  Visit Diagnosis: Chronic right shoulder pain  Stiffness of right shoulder, not elsewhere classified     Problem List Patient Active Problem List   Diagnosis Date Noted  . Coronary artery disease due to lipid rich plaque 03/18/2016  . Anxiety state 03/18/2016  . Vitamin D deficiency 03/18/2016  . 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 CAD (coronary artery disease) 12/30/2013  . 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 Osteopenia of the elderly 10/26/2013  . Generalized anxiety disorder 09/21/2013  . GERD (gastroesophageal reflux disease) 09/21/2013  . Pulmonary nodule seen on imaging study 09/21/2013  . SYNCOPE 02/02/2009  . Pre-diabetes 02/01/2009  . Hyperlipidemia 02/01/2009  . Essential hypertension 02/01/2009  . DIZZINESS 02/01/2009  . PALPITATIONS 02/01/2009    Phillips Climes, PTA 04/12/2018, 11:23 AM  [facility] [address] [place], [place], [address] Phone: [phone]   Fax:  [phone]  Name: NYAJAH HYSON MRN: [id number] Date of Birth: 1941/07/09

## 2018-04-12 NOTE — Telephone Encounter (Signed)
PT wants to talk to Misty Smith about when she went and saw dr Veverly Fells last week.

## 2018-04-13 ENCOUNTER — Telehealth: Payer: Self-pay | Admitting: *Deleted

## 2018-04-13 MED ORDER — NITROFURANTOIN MONOHYD MACRO 100 MG PO CAPS: 100.0000 mg | ORAL_CAPSULE | Freq: Two times a day (BID) | ORAL | 0 refills | Status: DC

## 2018-04-13 NOTE — Telephone Encounter (Signed)
Patient is allergic to sulfa Cipro and Keflex. Please call in Macrobid No. 14 1 twice daily with food for infection until completed and try to get a urine sent to [place] when the antibiotic is completed.  Drink plenty of water and stay well-hydrated.

## 2018-04-13 NOTE — Telephone Encounter (Signed)
Pt states that when she seen DR Veverly Fells she had some swelling. She will start using some compression hose.

## 2018-04-13 NOTE — Telephone Encounter (Signed)
Spoke with pt

## 2018-04-14 ENCOUNTER — Encounter: Payer: Medicare Other | Admitting: Physical Therapy

## 2018-04-16 ENCOUNTER — Encounter: Payer: Self-pay | Admitting: Physical Therapy

## 2018-04-16 ENCOUNTER — Ambulatory Visit: Payer: Medicare Other | Admitting: Physical Therapy

## 2018-04-16 DIAGNOSIS — M25511 Pain in right shoulder: Principal | ICD-10-CM

## 2018-04-16 DIAGNOSIS — G8929 Other chronic pain: Secondary | ICD-10-CM | POA: Diagnosis not present

## 2018-04-16 DIAGNOSIS — M25611 Stiffness of right shoulder, not elsewhere classified: Secondary | ICD-10-CM

## 2018-04-16 NOTE — Therapy (Signed)
[facility] [place], [place], [address] Phone: [phone]   Fax:  [phone]  Physical Therapy Treatment  Patient Details  Name: Misty Smith MRN: [id number] Date of Birth: 1941/02/19 Referring Provider: Esmond Plants MD   Encounter Date: 04/16/2018  PT End of Session - 04/16/18 1124    Visit Number  5    Number of Visits  16    Date for PT Re-Evaluation  06/30/18    PT Start Time  1030    PT Stop Time  1124    PT Time Calculation (min)  54 min    Activity Tolerance  Patient tolerated treatment well    Behavior During Therapy  [facility] for tasks assessed/performed       Past Medical History:  Diagnosis Date  . Anxiety   . Arthritis   . Bronchitis   . Cataract   . Colovaginal fistula   . Depression   . Diabetes mellitus without complication ([place])   . Dysrhythmia    palpitations, occasional PVCs; Dr Stanford Breed cardiologist  . Esophageal stricture   . Fractured elbow 1970's  . GERD (gastroesophageal reflux disease)   . H/O hiatal hernia   . Hyperlipidemia   . Hypertension   . Perimenopausal vasomotor symptoms   . Postmenopausal HRT (hormone replacement therapy)   . Pre-diabetes   . Stress   . Subacute lupus erythematosus    Dr. Sol Blazing      Past Surgical History:  Procedure Laterality Date  . abdominal bypass  1987   fibroids   . ABDOMINAL HYSTERECTOMY     partial  . bilateral tubal ligtation  1972  . CHOLECYSTECTOMY N/A 03/21/2014   Procedure: LAPAROSCOPIC CHOLECYSTECTOMY WITH INTRAOPERATIVE CHOLANGIOGRAM;  Surgeon: Shann Medal, MD;  Location: [place];  Service: General;  Laterality: N/A;  . COLONOSCOPY    . elbow Right 1976   surgery after fracture of elbow  . EYE SURGERY Bilateral    cataracts  . TONSILLECTOMY    . TUBAL LIGATION      There were no vitals filed for this visit.  Subjective Assessment - 04/16/18 1219    Subjective  Pt arriving to therapy in a sling. Pt reporting pain of 4/10 R  shoulder.     Patient is accompained by:  Family member    Pertinent History  DM; Right elbow fracture.    Patient Stated Goals  Use right UE again.    Currently in Pain?  Yes    Pain Score  4     Pain Location  Shoulder    Pain Orientation  Right    Pain Descriptors / Indicators  Aching;Tightness;Sore    Pain Type  Surgical pain    Pain Onset  1 to 4 weeks ago    Pain Frequency  Intermittent         [facility] PT Assessment - 04/16/18 0001      Assessment   Medical Diagnosis  Right RCR.                   [facility] Adult PT Treatment/Exercise - 04/16/18 0001      Exercises   Exercises  Shoulder      Shoulder Exercises: Supine   External Rotation  AAROM;Right;15 reps    Other Supine Exercises  supine cane AAROM 2x10      Shoulder Exercises: Isometric Strengthening   Flexion  5X10"    Flexion Limitations  standing (gentle push)    Extension  5X10"    Extension Limitations  standing (gentle push)    External Rotation  5X10"    External Rotation Limitations  standing (gentle push)    Internal Rotation  5X10"    Internal Rotation Limitations  standing (gentle push)      Modalities   Modalities  Electrical Stimulation;Vasopneumatic      Electrical Stimulation   Electrical Stimulation Location  right shoulder    Electrical Stimulation Action  IFC 1-10 Hz x 15 minutes    Electrical Stimulation Goals  Pain      Vasopneumatic   Number Minutes Vasopneumatic   15 minutes    Vasopnuematic Location   Shoulder    Vasopneumatic Pressure  Low    Vasopneumatic Temperature   34      Manual Therapy   Manual Therapy  Passive ROM    Passive ROM  Gentle PROM of R shoulder into flex, ER, IR with oscillations and gentle holds at end range             PT Education - 04/16/18 1123    Education Details  HEP    Person(s) Educated  Patient    Methods  Explanation;Handout;Demonstration;Verbal cues    Comprehension  Verbalized understanding;Returned demonstration        PT Short Term Goals - 04/01/18 1328      PT SHORT TERM GOAL #1   Title  STG's=LTG's.        PT Long Term Goals - 04/16/18 1224      PT LONG TERM GOAL #1   Title  Independent with a HEP.    Time  6    Period  Weeks    Status  On-going      PT LONG TERM GOAL #2   Title  Active right shoulder flexion to 145 degrees so the patient can easily reach overhead.    Time  6    Period  Weeks    Status  New      PT LONG TERM GOAL #3   Title  Active ER to 70 degrees+ to allow for easily donning/doffing of apparel.    Time  6    Period  Weeks    Status  New      PT LONG TERM GOAL #4   Title  Increase right shoulder strength to a solid 4/5 to increase stability for performance of functional activities.    Time  6    Period  Weeks    Status  New      PT LONG TERM GOAL #5   Title  Increase ROM so patient is able to reach behind back to L3.    Time  6    Period  Weeks    Status  New      PT LONG TERM GOAL #6   Title  Perform ADL's with pain not > 3/10.    Time  6    Period  Weeks    Status  New            Plan - 04/16/18 1220    Clinical Impression Statement  Pt tolerated treatment well with pain reported with exercises and PROM. Pt was issued a HEP for AAROM and isometrics. Continued with skilled PT.     PT Frequency  2x / week    PT Duration  6 weeks    PT Treatment/Interventions  ADLs/Self Care Home Management;Cryotherapy;Electrical Stimulation;Ultrasound;Therapeutic exercise;Therapeutic activities;Patient/family education;Passive range of motion;Manual techniques;Vasopneumatic Device    PT  Next Visit Plan  cont with POC for  PAROM to patient's right shoulder NO for AAROM and gentle isometrics Vasopneumatic on low (plillow between right elbow and thorax.    PT Home Exercise Plan  shoulder isometrics, AAROM shoulder flexion and ER    Consulted and Agree with Plan of Care  Patient       Patient will benefit from skilled therapeutic intervention in order to improve  the following deficits and impairments:  Decreased activity tolerance, Decreased range of motion, Pain  Visit Diagnosis: Chronic right shoulder pain  Stiffness of right shoulder, not elsewhere classified     Problem List Patient Active Problem List   Diagnosis Date Noted  . Coronary artery disease due to lipid rich plaque 03/18/2016  . Anxiety state 03/18/2016  . Vitamin D deficiency 03/18/2016  . 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 CAD (coronary artery disease) 12/30/2013  . 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 Osteopenia of the elderly 10/26/2013  . Generalized anxiety disorder 09/21/2013  . GERD (gastroesophageal reflux disease) 09/21/2013  . Pulmonary nodule seen on imaging study 09/21/2013  . SYNCOPE 02/02/2009  . Pre-diabetes 02/01/2009  . Hyperlipidemia 02/01/2009  . Essential hypertension 02/01/2009  . DIZZINESS 02/01/2009  . PALPITATIONS 02/01/2009    Oretha Caprice, MPT 04/16/2018, 12:30 PM  [facility] [address] [place], [place], [address] Phone: [phone]   Fax:  [phone]  Name: Misty Smith MRN: [id number] Date of Birth: [birth year]

## 2018-04-19 ENCOUNTER — Ambulatory Visit: Payer: Medicare Other | Admitting: Physical Therapy

## 2018-04-19 DIAGNOSIS — M25611 Stiffness of right shoulder, not elsewhere classified: Secondary | ICD-10-CM | POA: Diagnosis not present

## 2018-04-19 DIAGNOSIS — G8929 Other chronic pain: Secondary | ICD-10-CM | POA: Diagnosis not present

## 2018-04-19 DIAGNOSIS — M25511 Pain in right shoulder: Principal | ICD-10-CM

## 2018-04-19 NOTE — Therapy (Signed)
[facility] [place], [place], [address] Phone: [phone]   Fax:  [phone]  Physical Therapy Treatment  Patient Details  Name: Misty Smith MRN: [id number] Date of Birth: Mar 28, 1941 Referring Provider: Esmond Plants MD   Encounter Date: 04/19/2018  PT End of Session - 04/19/18 0944    Visit Number  6    Number of Visits  16    Date for PT Re-Evaluation  06/30/18    PT Start Time  0945    PT Stop Time  1043    PT Time Calculation (min)  58 min    Activity Tolerance  Patient tolerated treatment well    Behavior During Therapy  [facility] for tasks assessed/performed       Past Medical History:  Diagnosis Date  . Anxiety   . Arthritis   . Bronchitis   . Cataract   . Colovaginal fistula   . Depression   . Diabetes mellitus without complication ([place])   . Dysrhythmia    palpitations, occasional PVCs; Dr Stanford Breed cardiologist  . Esophageal stricture   . Fractured elbow 1970's  . GERD (gastroesophageal reflux disease)   . H/O hiatal hernia   . Hyperlipidemia   . Hypertension   . Perimenopausal vasomotor symptoms   . Postmenopausal HRT (hormone replacement therapy)   . Pre-diabetes   . Stress   . Subacute lupus erythematosus    Dr. Sol Blazing      Past Surgical History:  Procedure Laterality Date  . abdominal bypass  1987   fibroids   . ABDOMINAL HYSTERECTOMY     partial  . bilateral tubal ligtation  1972  . CHOLECYSTECTOMY N/A 03/21/2014   Procedure: LAPAROSCOPIC CHOLECYSTECTOMY WITH INTRAOPERATIVE CHOLANGIOGRAM;  Surgeon: Shann Medal, MD;  Location: [place];  Service: General;  Laterality: N/A;  . COLONOSCOPY    . elbow Right 1976   surgery after fracture of elbow  . EYE SURGERY Bilateral    cataracts  . TONSILLECTOMY    . TUBAL LIGATION      There were no vitals filed for this visit.      [facility] PT Assessment - 04/19/18 0001      Assessment   Medical Diagnosis  Right RCR.                    [facility] Adult PT Treatment/Exercise - 04/19/18 0001      Exercises   Exercises  Shoulder      Shoulder Exercises: Supine   External Rotation  AAROM;Right;20 reps    Flexion  AAROM;Both;20 reps      Shoulder Exercises: Isometric Strengthening   Flexion  5X10"    Flexion Limitations  standing (gentle push)    Extension  5X10"    Extension Limitations  standing (gentle push)    External Rotation  5X10"    External Rotation Limitations  standing (gentle push)    Internal Rotation  5X10"    Internal Rotation Limitations  standing (gentle push)      Modalities   Modalities  Electrical Stimulation;Vasopneumatic      Electrical Stimulation   Electrical Stimulation Location  right shoulder    Electrical Stimulation Action  IFC    Electrical Stimulation Parameters  1-10 hz x15 mins    Electrical Stimulation Goals  Pain      Vasopneumatic   Number Minutes Vasopneumatic   15 minutes    Vasopnuematic Location   Shoulder    Vasopneumatic Pressure  Low  Vasopneumatic Temperature   34      Manual Therapy   Manual Therapy  Passive ROM    Passive ROM  Gentle PROM of R shoulder into flex, ER, IR with oscillations and gentle holds at end range               PT Short Term Goals - 04/01/18 1328      PT SHORT TERM GOAL #1   Title  STG's=LTG's.        PT Long Term Goals - 04/16/18 1224      PT LONG TERM GOAL #1   Title  Independent with a HEP.    Time  6    Period  Weeks    Status  On-going      PT LONG TERM GOAL #2   Title  Active right shoulder flexion to 145 degrees so the patient can easily reach overhead.    Time  6    Period  Weeks    Status  New      PT LONG TERM GOAL #3   Title  Active ER to 70 degrees+ to allow for easily donning/doffing of apparel.    Time  6    Period  Weeks    Status  New      PT LONG TERM GOAL #4   Title  Increase right shoulder strength to a solid 4/5 to increase stability for performance of functional  activities.    Time  6    Period  Weeks    Status  New      PT LONG TERM GOAL #5   Title  Increase ROM so patient is able to reach behind back to L3.    Time  6    Period  Weeks    Status  New      PT LONG TERM GOAL #6   Title  Perform ADL's with pain not > 3/10.    Time  6    Period  Weeks    Status  New            Plan - 04/19/18 1122    Clinical Impression Statement  Patient able to tolerate treatment well with minimal reports of pain with exercises and with end range PROM. Patient noted with smooth arc of motion during PROM. Patient and patient's daughter educated emphasis of HEP to improve ROM. Both reported understanding. Normal response to modalities upon removal.     Clinical Presentation  Stable    Clinical Decision Making  Low    PT Frequency  2x / week    PT Duration  6 weeks    PT Treatment/Interventions  ADLs/Self Care Home Management;Cryotherapy;Electrical Stimulation;Ultrasound;Therapeutic exercise;Therapeutic activities;Patient/family education;Passive range of motion;Manual techniques;Vasopneumatic Device    PT Next Visit Plan  cont with POC for  PROM to patient's right shoulder NO for AAROM and gentle isometrics Vasopneumatic on low (plillow between right elbow and thorax.    Consulted and Agree with Plan of Care  Patient       Patient will benefit from skilled therapeutic intervention in order to improve the following deficits and impairments:  Decreased activity tolerance, Decreased range of motion, Pain  Visit Diagnosis: Chronic right shoulder pain  Stiffness of right shoulder, not elsewhere classified     Problem List Patient Active Problem List   Diagnosis Date Noted  . Coronary artery disease due to lipid rich plaque 03/18/2016  . Anxiety state 03/18/2016  . Vitamin D deficiency 03/18/2016  .  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 CAD (coronary artery disease) 12/30/2013  . 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 Osteopenia of the elderly  10/26/2013  . Generalized anxiety disorder 09/21/2013  . GERD (gastroesophageal reflux disease) 09/21/2013  . Pulmonary nodule seen on imaging study 09/21/2013  . SYNCOPE 02/02/2009  . Pre-diabetes 02/01/2009  . Hyperlipidemia 02/01/2009  . Essential hypertension 02/01/2009  . DIZZINESS 02/01/2009  . PALPITATIONS 02/01/2009   Gabriela Eves, PT, DPT 04/19/2018, 11:25 AM  [facility] [address] [place], [place], [address] Phone: [phone]   Fax:  [phone]  Name: Misty Smith MRN: [id number] Date of Birth: 02-22-1941

## 2018-04-22 ENCOUNTER — Ambulatory Visit: Payer: Medicare Other | Admitting: Physical Therapy

## 2018-04-22 ENCOUNTER — Encounter: Payer: Self-pay | Admitting: Physical Therapy

## 2018-04-22 DIAGNOSIS — M25611 Stiffness of right shoulder, not elsewhere classified: Secondary | ICD-10-CM

## 2018-04-22 DIAGNOSIS — M25511 Pain in right shoulder: Principal | ICD-10-CM

## 2018-04-22 DIAGNOSIS — G8929 Other chronic pain: Secondary | ICD-10-CM | POA: Diagnosis not present

## 2018-04-22 NOTE — Therapy (Signed)
[facility] [place], [place], [address] Phone: [phone]   Fax:  [phone]  Physical Therapy Treatment  Patient Details  Name: Misty Smith MRN: [id number] Date of Birth: April 11, 1941 Referring Provider: Esmond Plants MD   Encounter Date: 04/22/2018  PT End of Session - 04/22/18 1016    Visit Number  7    Number of Visits  16    Date for PT Re-Evaluation  06/30/18    PT Start Time  0945    PT Stop Time  1029    PT Time Calculation (min)  44 min    Activity Tolerance  Patient tolerated treatment well    Behavior During Therapy  [facility] for tasks assessed/performed       Past Medical History:  Diagnosis Date  . Anxiety   . Arthritis   . Bronchitis   . Cataract   . Colovaginal fistula   . Depression   . Diabetes mellitus without complication ([place])   . Dysrhythmia    palpitations, occasional PVCs; Dr Stanford Breed cardiologist  . Esophageal stricture   . Fractured elbow 1970's  . GERD (gastroesophageal reflux disease)   . H/O hiatal hernia   . Hyperlipidemia   . Hypertension   . Perimenopausal vasomotor symptoms   . Postmenopausal HRT (hormone replacement therapy)   . Pre-diabetes   . Stress   . Subacute lupus erythematosus    Dr. Sol Blazing      Past Surgical History:  Procedure Laterality Date  . abdominal bypass  1987   fibroids   . ABDOMINAL HYSTERECTOMY     partial  . bilateral tubal ligtation  1972  . CHOLECYSTECTOMY N/A 03/21/2014   Procedure: LAPAROSCOPIC CHOLECYSTECTOMY WITH INTRAOPERATIVE CHOLANGIOGRAM;  Surgeon: Shann Medal, MD;  Location: [place];  Service: General;  Laterality: N/A;  . COLONOSCOPY    . elbow Right 1976   surgery after fracture of elbow  . EYE SURGERY Bilateral    cataracts  . TONSILLECTOMY    . TUBAL LIGATION      There were no vitals filed for this visit.  Subjective Assessment - 04/22/18 0957    Subjective  Patient arrived with minimal discomfort overall    Patient is  accompained by:  Family member    Pertinent History  DM; Right elbow fracture.    Patient Stated Goals  Use right UE again.    Currently in Pain?  Yes    Pain Score  3     Pain Location  Shoulder    Pain Orientation  Right    Pain Descriptors / Indicators  Aching;Tightness;Sore    Pain Type  Surgical pain    Pain Onset  1 to 4 weeks ago    Pain Frequency  Intermittent    Aggravating Factors   movement    Pain Relieving Factors  at rest         [facility] PT Assessment - 04/22/18 0001      ROM / Strength   AROM / PROM / Strength  PROM      PROM   PROM Assessment Site  Shoulder    Right/Left Shoulder  Right    Right Shoulder Flexion  108 Degrees    Right Shoulder External Rotation  58 Degrees                   [facility] Adult PT Treatment/Exercise - 04/22/18 0001      Electrical Stimulation   Electrical Stimulation Location  right shoulder    Electrical Stimulation Action  IFC    Electrical Stimulation Parameters  1-10hz  x51min    Electrical Stimulation Goals  Pain      Vasopneumatic   Number Minutes Vasopneumatic   15 minutes    Vasopnuematic Location   Shoulder    Vasopneumatic Pressure  Low      Manual Therapy   Manual Therapy  Passive ROM    Passive ROM  Gentle PAAROM of R shoulder into flex, ER, IR with oscillations and gentle holds at end range to improve ROM               PT Short Term Goals - 04/01/18 1328      PT SHORT TERM GOAL #1   Title  STG's=LTG's.        PT Long Term Goals - 04/16/18 1224      PT LONG TERM GOAL #1   Title  Independent with a HEP.    Time  6    Period  Weeks    Status  On-going      PT LONG TERM GOAL #2   Title  Active right shoulder flexion to 145 degrees so the patient can easily reach overhead.    Time  6    Period  Weeks    Status  New      PT LONG TERM GOAL #3   Title  Active ER to 70 degrees+ to allow for easily donning/doffing of apparel.    Time  6    Period  Weeks    Status  New      PT LONG  TERM GOAL #4   Title  Increase right shoulder strength to a solid 4/5 to increase stability for performance of functional activities.    Time  6    Period  Weeks    Status  New      PT LONG TERM GOAL #5   Title  Increase ROM so patient is able to reach behind back to L3.    Time  6    Period  Weeks    Status  New      PT LONG TERM GOAL #6   Title  Perform ADL's with pain not > 3/10.    Time  6    Period  Weeks    Status  New            Plan - 04/22/18 1018    Clinical Impression Statement  Patient tolerated treatment well today. patient has minimal soreness overall and just sore with movement. Patient reported doing HEP daily. Patient has improved with PAAROM today for flexion and ER today. Patient progressing toward goals per protocol.    PT Frequency  2x / week    PT Duration  6 weeks    PT Treatment/Interventions  ADLs/Self Care Home Management;Cryotherapy;Electrical Stimulation;Ultrasound;Therapeutic exercise;Therapeutic activities;Patient/family education;Passive range of motion;Manual techniques;Vasopneumatic Device    PT Next Visit Plan  cont with POC for  PROM to patient's right shoulder N.O. for AAROM and gentle isometrics Vasopneumatic on low (plillow between right elbow and thorax.    Consulted and Agree with Plan of Care  Patient       Patient will benefit from skilled therapeutic intervention in order to improve the following deficits and impairments:  Decreased activity tolerance, Decreased range of motion, Pain  Visit Diagnosis: Chronic right shoulder pain  Stiffness of right shoulder, not elsewhere classified     Problem List Patient Active Problem  List   Diagnosis Date Noted  . Coronary artery disease due to lipid rich plaque 03/18/2016  . Anxiety state 03/18/2016  . Vitamin D deficiency 03/18/2016  . 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 CAD (coronary artery disease) 12/30/2013  . 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 Osteopenia of the  elderly 10/26/2013  . Generalized anxiety disorder 09/21/2013  . GERD (gastroesophageal reflux disease) 09/21/2013  . Pulmonary nodule seen on imaging study 09/21/2013  . SYNCOPE 02/02/2009  . Pre-diabetes 02/01/2009  . Hyperlipidemia 02/01/2009  . Essential hypertension 02/01/2009  . DIZZINESS 02/01/2009  . PALPITATIONS 02/01/2009    Phillips Climes, PTA 04/22/2018, 10:35 AM  [facility] [place], [place], [address] Phone: [phone]   Fax:  [phone]  Name: ELON LOMELI MRN: [id number] Date of Birth: Nov 06, 1940

## 2018-04-26 ENCOUNTER — Ambulatory Visit: Payer: Medicare Other | Admitting: Physical Therapy

## 2018-04-26 ENCOUNTER — Encounter: Payer: Self-pay | Admitting: Physical Therapy

## 2018-04-26 DIAGNOSIS — M25511 Pain in right shoulder: Principal | ICD-10-CM

## 2018-04-26 DIAGNOSIS — M25611 Stiffness of right shoulder, not elsewhere classified: Secondary | ICD-10-CM | POA: Diagnosis not present

## 2018-04-26 DIAGNOSIS — G8929 Other chronic pain: Secondary | ICD-10-CM | POA: Diagnosis not present

## 2018-04-26 NOTE — Therapy (Signed)
[facility] [place], [place], [address] Phone: [phone]   Fax:  [phone]  Physical Therapy Treatment  Patient Details  Name: Misty Smith MRN: [id number] Date of Birth: 1940-10-06 Referring Provider: Esmond Plants MD   Encounter Date: 04/26/2018  PT End of Session - 04/26/18 0950    Visit Number  8    Number of Visits  16    Date for PT Re-Evaluation  06/30/18    PT Start Time  0950    PT Stop Time  1034    PT Time Calculation (min)  44 min    Activity Tolerance  Patient tolerated treatment well    Behavior During Therapy  [facility] for tasks assessed/performed       Past Medical History:  Diagnosis Date  . Anxiety   . Arthritis   . Bronchitis   . Cataract   . Colovaginal fistula   . Depression   . Diabetes mellitus without complication ([place])   . Dysrhythmia    palpitations, occasional PVCs; Dr Stanford Breed cardiologist  . Esophageal stricture   . Fractured elbow 1970's  . GERD (gastroesophageal reflux disease)   . H/O hiatal hernia   . Hyperlipidemia   . Hypertension   . Perimenopausal vasomotor symptoms   . Postmenopausal HRT (hormone replacement therapy)   . Pre-diabetes   . Stress   . Subacute lupus erythematosus    Dr. Sol Blazing      Past Surgical History:  Procedure Laterality Date  . abdominal bypass  1987   fibroids   . ABDOMINAL HYSTERECTOMY     partial  . bilateral tubal ligtation  1972  . CHOLECYSTECTOMY N/A 03/21/2014   Procedure: LAPAROSCOPIC CHOLECYSTECTOMY WITH INTRAOPERATIVE CHOLANGIOGRAM;  Surgeon: Shann Medal, MD;  Location: [place];  Service: General;  Laterality: N/A;  . COLONOSCOPY    . elbow Right 1976   surgery after fracture of elbow  . EYE SURGERY Bilateral    cataracts  . TONSILLECTOMY    . TUBAL LIGATION      There were no vitals filed for this visit.  Subjective Assessment - 04/26/18 0949    Subjective  Reports some soreness in R shoulder and that she started  isometrics last week.    Patient is accompained by:  Family member    Pertinent History  DM; Right elbow fracture.    Patient Stated Goals  Use right UE again.    Currently in Pain?  Yes    Pain Score  1     Pain Location  Shoulder    Pain Orientation  Right    Pain Descriptors / Indicators  Sore    Pain Type  Surgical pain    Pain Onset  1 to 4 weeks ago    Pain Frequency  Intermittent         [facility] PT Assessment - 04/26/18 0001      Assessment   Medical Diagnosis  Right RCR.    Onset Date/Surgical Date  03/29/18    Next MD Visit  05/06/2018      Restrictions   Weight Bearing Restrictions  No                   [facility] Adult PT Treatment/Exercise - 04/26/18 0001      Shoulder Exercises: Isometric Strengthening   Flexion  5X10"    Extension  5X10"    External Rotation  5X10"    Internal Rotation  5X10"  Modalities   Modalities  Sports coach Action  Pre-Mod    Electrical Stimulation Parameters  80-150 hz x15 min    Electrical Stimulation Goals  Pain      Vasopneumatic   Number Minutes Vasopneumatic   15 minutes    Vasopnuematic Location   Shoulder    Vasopneumatic Pressure  Low    Vasopneumatic Temperature   34      Manual Therapy   Manual Therapy  Passive ROM    Passive ROM  Gentle PAAROM of R shoulder into flex, ER, IR with oscillations and gentle holds at end range to improve ROM               PT Short Term Goals - 04/01/18 1328      PT SHORT TERM GOAL #1   Title  STG's=LTG's.        PT Long Term Goals - 04/26/18 1027      PT LONG TERM GOAL #1   Title  Independent with a HEP.    Time  6    Period  Weeks    Status  Achieved      PT LONG TERM GOAL #2   Title  Active right shoulder flexion to 145 degrees so the patient can easily reach overhead.    Time  6    Period  Weeks    Status  On-going      PT  LONG TERM GOAL #3   Title  Active ER to 70 degrees+ to allow for easily donning/doffing of apparel.    Time  6    Period  Weeks    Status  On-going      PT LONG TERM GOAL #4   Title  Increase right shoulder strength to a solid 4/5 to increase stability for performance of functional activities.    Time  6    Period  Weeks    Status  On-going      PT LONG TERM GOAL #5   Title  Increase ROM so patient is able to reach behind back to L3.    Time  6    Period  Weeks    Status  On-going      PT LONG TERM GOAL #6   Title  Perform ADL's with pain not > 3/10.    Time  6    Period  Weeks    Status  On-going            Plan - 04/26/18 1026    Clinical Impression Statement  Patient tolerated today's treatment well as less facial grimacing and reports of discomfort were noted during today's treatment. Patient still compliant with sling use as it was donned upon arrival. Firm end feels and smooth arc of motion noted with PAAROM of R shoulder with minimal oscillations provided due to pain. Goal remain on-going at this time secondary to protocol limitations as well as strength and lack of full ADLs with RUE at this time. Normal modalities response noted following removal of the modalities.    PT Frequency  2x / week    PT Duration  6 weeks    PT Treatment/Interventions  ADLs/Self Care Home Management;Cryotherapy;Electrical Stimulation;Ultrasound;Therapeutic exercise;Therapeutic activities;Patient/family education;Passive range of motion;Manual techniques;Vasopneumatic Device    PT Next Visit Plan  cont with POC for  PROM to patient's right shoulder N.O. for AAROM and gentle  isometrics Vasopneumatic on low (plillow between right elbow and thorax.    PT Home Exercise Plan  shoulder isometrics, AAROM shoulder flexion and ER    Consulted and Agree with Plan of Care  Patient       Patient will benefit from skilled therapeutic intervention in order to improve the following deficits and  impairments:  Decreased activity tolerance, Decreased range of motion, Pain  Visit Diagnosis: Chronic right shoulder pain  Stiffness of right shoulder, not elsewhere classified     Problem List Patient Active Problem List   Diagnosis Date Noted  . Coronary artery disease due to lipid rich plaque 03/18/2016  . Anxiety state 03/18/2016  . Vitamin D deficiency 03/18/2016  . 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 CAD (coronary artery disease) 12/30/2013  . 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 Osteopenia of the elderly 10/26/2013  . Generalized anxiety disorder 09/21/2013  . GERD (gastroesophageal reflux disease) 09/21/2013  . Pulmonary nodule seen on imaging study 09/21/2013  . SYNCOPE 02/02/2009  . Pre-diabetes 02/01/2009  . Hyperlipidemia 02/01/2009  . Essential hypertension 02/01/2009  . DIZZINESS 02/01/2009  . PALPITATIONS 02/01/2009    Standley Brooking, PTA 04/26/2018, 10:39 AM  [facility] [address] [place], [place], [address] Phone: [phone]   Fax:  [phone]  Name: Misty Smith MRN: [id number] Date of Birth: [birth year]

## 2018-04-28 ENCOUNTER — Ambulatory Visit: Payer: Medicare Other | Admitting: Physical Therapy

## 2018-04-28 ENCOUNTER — Encounter: Payer: Self-pay | Admitting: Physical Therapy

## 2018-04-28 DIAGNOSIS — M25611 Stiffness of right shoulder, not elsewhere classified: Secondary | ICD-10-CM

## 2018-04-28 DIAGNOSIS — G8929 Other chronic pain: Secondary | ICD-10-CM

## 2018-04-28 DIAGNOSIS — M25511 Pain in right shoulder: Principal | ICD-10-CM

## 2018-04-28 NOTE — Therapy (Signed)
[facility] [place], [place], [address] Phone: [phone]   Fax:  [phone]  Physical Therapy Treatment  Patient Details  Name: Misty Smith MRN: [id number] Date of Birth: 04-11-1941 Referring Provider: Esmond Plants MD   Encounter Date: 04/28/2018  PT End of Session - 04/28/18 0949    Visit Number  9    Number of Visits  16    Date for PT Re-Evaluation  06/30/18    PT Start Time  3329    PT Stop Time  5188    PT Time Calculation (min)  45 min       Past Medical History:  Diagnosis Date  . Anxiety   . Arthritis   . Bronchitis   . Cataract   . Colovaginal fistula   . Depression   . Diabetes mellitus without complication ([place])   . Dysrhythmia    palpitations, occasional PVCs; Dr Stanford Breed cardiologist  . Esophageal stricture   . Fractured elbow 1970's  . GERD (gastroesophageal reflux disease)   . H/O hiatal hernia   . Hyperlipidemia   . Hypertension   . Perimenopausal vasomotor symptoms   . Postmenopausal HRT (hormone replacement therapy)   . Pre-diabetes   . Stress   . Subacute lupus erythematosus    Dr. Sol Blazing      Past Surgical History:  Procedure Laterality Date  . abdominal bypass  1987   fibroids   . ABDOMINAL HYSTERECTOMY     partial  . bilateral tubal ligtation  1972  . CHOLECYSTECTOMY N/A 03/21/2014   Procedure: LAPAROSCOPIC CHOLECYSTECTOMY WITH INTRAOPERATIVE CHOLANGIOGRAM;  Surgeon: Shann Medal, MD;  Location: [place];  Service: General;  Laterality: N/A;  . COLONOSCOPY    . elbow Right 1976   surgery after fracture of elbow  . EYE SURGERY Bilateral    cataracts  . TONSILLECTOMY    . TUBAL LIGATION      There were no vitals filed for this visit.  Subjective Assessment - 04/28/18 0948    Subjective  Reports that she has a little soreness in R shoulder upon arrival.    Patient is accompained by:  Family member   Daughter   Pertinent History  DM; Right elbow fracture.    Patient Stated Goals  Use right UE again.    Currently in Pain?  Yes    Pain Score  1     Pain Location  Shoulder    Pain Orientation  Right    Pain Descriptors / Indicators  Sore    Pain Type  Surgical pain    Pain Onset  1 to 4 weeks ago    Pain Frequency  Intermittent         [facility] PT Assessment - 04/28/18 0001      Assessment   Medical Diagnosis  Right RCR.    Onset Date/Surgical Date  03/29/18    Next MD Visit  05/06/2018      Restrictions   Weight Bearing Restrictions  No                   [facility] Adult PT Treatment/Exercise - 04/28/18 0001      Exercises   Exercises  Shoulder      Shoulder Exercises: Supine   Protraction  AAROM;Both;10 reps    External Rotation  AAROM;Right;15 reps    Flexion  AAROM;Both;20 reps      Shoulder Exercises: Seated   Other Seated Exercises  RUE ranger into flexion,  CW and CCW circles x6 min      Shoulder Exercises: Isometric Strengthening   Flexion  Other (comment)   x10 reps with 10 sec holds   Extension  5X10"    External Rotation  5X10"    Internal Rotation  5X10"      Modalities   Modalities  Electrical Stimulation;Vasopneumatic      Electrical Stimulation   Electrical Stimulation Location  R shoulder    Electrical Stimulation Action  IFC    Electrical Stimulation Parameters  80-150 hz x15 min    Electrical Stimulation Goals  Pain      Vasopneumatic   Number Minutes Vasopneumatic   15 minutes    Vasopnuematic Location   Shoulder    Vasopneumatic Pressure  Low    Vasopneumatic Temperature   34      Manual Therapy   Manual Therapy  Passive ROM    Passive ROM  Gentle PAAROM of R shoulder into flex, ER, IR with oscillations and gentle holds at end range to improve ROM               PT Short Term Goals - 04/01/18 1328      PT SHORT TERM GOAL #1   Title  STG's=LTG's.        PT Long Term Goals - 04/26/18 1027      PT LONG TERM GOAL #1   Title  Independent with a HEP.    Time  6    Period   Weeks    Status  Achieved      PT LONG TERM GOAL #2   Title  Active right shoulder flexion to 145 degrees so the patient can easily reach overhead.    Time  6    Period  Weeks    Status  On-going      PT LONG TERM GOAL #3   Title  Active ER to 70 degrees+ to allow for easily donning/doffing of apparel.    Time  6    Period  Weeks    Status  On-going      PT LONG TERM GOAL #4   Title  Increase right shoulder strength to a solid 4/5 to increase stability for performance of functional activities.    Time  6    Period  Weeks    Status  On-going      PT LONG TERM GOAL #5   Title  Increase ROM so patient is able to reach behind back to L3.    Time  6    Period  Weeks    Status  On-going      PT LONG TERM GOAL #6   Title  Perform ADL's with pain not > 3/10.    Time  6    Period  Weeks    Status  On-going            Plan - 04/28/18 1128    Clinical Impression Statement  Patient tolerated the progression of treatment fairly well today as she intermittantly reported discomfort or soreness with exercises. Patient currently compliant with sling use at this time. Patient guided through more AAROM exercises per MD orders. Patient educated that soreness may continue due to progression during her recovery. Normal modalities response noted following removal of the modalities.    PT Frequency  2x / week    PT Duration  6 weeks    PT Treatment/Interventions  ADLs/Self Care Home Management;Cryotherapy;Electrical Stimulation;Ultrasound;Therapeutic exercise;Therapeutic activities;Patient/family education;Passive range of  motion;Manual techniques;Vasopneumatic Device;Visual/perceptual remediation/compensation    PT Next Visit Plan  cont with POC for  PROM to patient's right shoulder N.O. for AAROM and gentle isometrics Vasopneumatic on low (plillow between right elbow and thorax.    PT Home Exercise Plan  shoulder isometrics, AAROM shoulder flexion and ER    Consulted and Agree with Plan of  Care  Patient       Patient will benefit from skilled therapeutic intervention in order to improve the following deficits and impairments:  Decreased activity tolerance, Decreased range of motion, Pain  Visit Diagnosis: Chronic right shoulder pain  Stiffness of right shoulder, not elsewhere classified     Problem List Patient Active Problem List   Diagnosis Date Noted  . Coronary artery disease due to lipid rich plaque 03/18/2016  . Anxiety state 03/18/2016  . Vitamin D deficiency 03/18/2016  . 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 CAD (coronary artery disease) 12/30/2013  . 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 Osteopenia of the elderly 10/26/2013  . Generalized anxiety disorder 09/21/2013  . GERD (gastroesophageal reflux disease) 09/21/2013  . Pulmonary nodule seen on imaging study 09/21/2013  . SYNCOPE 02/02/2009  . Pre-diabetes 02/01/2009  . Hyperlipidemia 02/01/2009  . Essential hypertension 02/01/2009  . DIZZINESS 02/01/2009  . PALPITATIONS 02/01/2009    Standley Brooking, PTA 04/28/2018, 11:35 AM  [facility] [address] [place], [place], [address] Phone: [phone]   Fax:  [phone]  Name: Misty Smith MRN: [id number] Date of Birth: 23-Jun-1941

## 2018-05-04 ENCOUNTER — Encounter: Payer: Self-pay | Admitting: Physical Therapy

## 2018-05-04 ENCOUNTER — Ambulatory Visit: Payer: Medicare Other | Admitting: *Deleted

## 2018-05-04 ENCOUNTER — Encounter: Payer: Medicare Other | Admitting: *Deleted

## 2018-05-04 ENCOUNTER — Ambulatory Visit: Payer: Medicare Other | Attending: Orthopedic Surgery | Admitting: Physical Therapy

## 2018-05-04 DIAGNOSIS — M25511 Pain in right shoulder: Secondary | ICD-10-CM | POA: Diagnosis not present

## 2018-05-04 DIAGNOSIS — G8929 Other chronic pain: Secondary | ICD-10-CM | POA: Diagnosis not present

## 2018-05-04 DIAGNOSIS — M25611 Stiffness of right shoulder, not elsewhere classified: Secondary | ICD-10-CM | POA: Diagnosis not present

## 2018-05-04 NOTE — Therapy (Addendum)
[facility] [place], [place], [address] Phone: [phone]   Fax:  [phone]  Physical Therapy Treatment  Patient Details  Name: Misty Smith MRN: [id number] Date of Birth: Aug 24, 1941 Referring Provider: Esmond Plants MD   Encounter Date: 05/04/2018  PT End of Session - 05/04/18 0950    Visit Number  10    Number of Visits  16    Date for PT Re-Evaluation  06/30/18    PT Start Time  0948    PT Stop Time  1037    PT Time Calculation (min)  49 min    Activity Tolerance  Patient tolerated treatment well    Behavior During Therapy  [facility] for tasks assessed/performed       Past Medical History:  Diagnosis Date  . Anxiety   . Arthritis   . Bronchitis   . Cataract   . Colovaginal fistula   . Depression   . Diabetes mellitus without complication ([place])   . Dysrhythmia    palpitations, occasional PVCs; Dr Stanford Breed cardiologist  . Esophageal stricture   . Fractured elbow 1970's  . GERD (gastroesophageal reflux disease)   . H/O hiatal hernia   . Hyperlipidemia   . Hypertension   . Perimenopausal vasomotor symptoms   . Postmenopausal HRT (hormone replacement therapy)   . Pre-diabetes   . Stress   . Subacute lupus erythematosus    Dr. Sol Blazing      Past Surgical History:  Procedure Laterality Date  . abdominal bypass  1987   fibroids   . ABDOMINAL HYSTERECTOMY     partial  . bilateral tubal ligtation  1972  . CHOLECYSTECTOMY N/A 03/21/2014   Procedure: LAPAROSCOPIC CHOLECYSTECTOMY WITH INTRAOPERATIVE CHOLANGIOGRAM;  Surgeon: Shann Medal, MD;  Location: [place];  Service: General;  Laterality: N/A;  . COLONOSCOPY    . elbow Right 1976   surgery after fracture of elbow  . EYE SURGERY Bilateral    cataracts  . TONSILLECTOMY    . TUBAL LIGATION      There were no vitals filed for this visit.  Subjective Assessment - 05/04/18 0947    Subjective  Reports that she can comb her hair with her R hand now. Reports  that she goes to MD this week.    Patient is accompained by:  Family member   Son   Pertinent History  DM; Right elbow fracture.    Patient Stated Goals  Use right UE again.    Currently in Pain?  No/denies         [facility] PT Assessment - 05/04/18 0001      Assessment   Medical Diagnosis  Right RCR.    Onset Date/Surgical Date  03/29/18    Next MD Visit  05/06/2018      Restrictions   Weight Bearing Restrictions  No                   [facility] Adult PT Treatment/Exercise - 05/04/18 0001      Exercises   Exercises  Shoulder      Shoulder Exercises: Supine   Protraction  AAROM;Both;15 reps    External Rotation  AAROM;Right;15 reps    Flexion  AAROM;Both;15 reps      Shoulder Exercises: Pulleys   Flexion  5 minutes      Shoulder Exercises: Isometric Strengthening   Flexion  5X10"    Extension  5X10"    External Rotation  5X10"    Internal  Rotation  5X10"      Modalities   Modalities  Astronomer  IFC    Electrical Stimulation Parameters  80-150 hz x15 min    Electrical Stimulation Goals  Pain      Vasopneumatic   Number Minutes Vasopneumatic   15 minutes    Vasopnuematic Location   Shoulder    Vasopneumatic Pressure  Low    Vasopneumatic Temperature   34      Manual Therapy   Manual Therapy  Passive ROM    Passive ROM  Gentle PAAROM of R shoulder into flex, ER, IR with oscillations and gentle holds at end range to improve ROM               PT Short Term Goals - 04/01/18 1328      PT SHORT TERM GOAL #1   Title  STG's=LTG's.        PT Long Term Goals - 04/26/18 1027      PT LONG TERM GOAL #1   Title  Independent with a HEP.    Time  6    Period  Weeks    Status  Achieved      PT LONG TERM GOAL #2   Title  Active right shoulder flexion to 145 degrees so the patient can easily reach overhead.    Time  6     Period  Weeks    Status  On-going      PT LONG TERM GOAL #3   Title  Active ER to 70 degrees+ to allow for easily donning/doffing of apparel.    Time  6    Period  Weeks    Status  On-going      PT LONG TERM GOAL #4   Title  Increase right shoulder strength to a solid 4/5 to increase stability for performance of functional activities.    Time  6    Period  Weeks    Status  On-going      PT LONG TERM GOAL #5   Title  Increase ROM so patient is able to reach behind back to L3.    Time  6    Period  Weeks    Status  On-going      PT LONG TERM GOAL #6   Title  Perform ADL's with pain not > 3/10.    Time  6    Period  Weeks    Status  On-going            Plan - 05/04/18 1036    Clinical Impression Statement  Patient tolerated today's treatment light progression fairly well with no reports of any increased pain in R shoulder. Patient educated regarding proper technique and timing for all exercises today. Firm end feels and smooth arc of motion noted with all directions of PROM of R shoulder. Patient educated again that soreness may present with progression of exercises. Goal progression limited due to protocol. Normal modalities response noted following removal of the modalities. MD note routed to MD and printed for patient.    PT Frequency  2x / week    PT Duration  6 weeks    PT Treatment/Interventions  ADLs/Self Care Home Management;Cryotherapy;Electrical Stimulation;Ultrasound;Therapeutic exercise;Therapeutic activities;Patient/family education;Passive range of motion;Manual techniques;Vasopneumatic Device;Visual/perceptual remediation/compensation    PT Next Visit Plan  cont with POC for  PROM to patient's right  shoulder N.O. for AAROM and gentle isometrics Vasopneumatic on low (plillow between right elbow and thorax.    PT Home Exercise Plan  shoulder isometrics, AAROM shoulder flexion and ER    Consulted and Agree with Plan of Care  Patient       Patient will benefit  from skilled therapeutic intervention in order to improve the following deficits and impairments:  Decreased activity tolerance, Decreased range of motion, Pain  Visit Diagnosis: Chronic right shoulder pain  Stiffness of right shoulder, not elsewhere classified     Problem List Patient Active Problem List   Diagnosis Date Noted  . Coronary artery disease due to lipid rich plaque 03/18/2016  . Anxiety state 03/18/2016  . Vitamin D deficiency 03/18/2016  . 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 CAD (coronary artery disease) 12/30/2013  . 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 Osteopenia of the elderly 10/26/2013  . Generalized anxiety disorder 09/21/2013  . GERD (gastroesophageal reflux disease) 09/21/2013  . Pulmonary nodule seen on imaging study 09/21/2013  . SYNCOPE 02/02/2009  . Pre-diabetes 02/01/2009  . Hyperlipidemia 02/01/2009  . Essential hypertension 02/01/2009  . DIZZINESS 02/01/2009  . PALPITATIONS 02/01/2009    Standley Brooking, PTA 05/04/18 2:09 PM   [facility] [address] [place], [place], [address] Phone: [phone]   Fax:  [phone]  Name: PAMA ROSKOS MRN: [id number] Date of Birth: 11-Mar-1941  Progress Note Reporting Period 04/01/18 to 05/04/18.  See note below for Objective Data and Assessment of Progress/Goals. Excellent progress toward goals in accord with surgeon guidelines.  Goals ongoing at this time.   Mali Applegate MPT

## 2018-05-06 ENCOUNTER — Encounter: Payer: Medicare Other | Admitting: Physical Therapy

## 2018-05-07 ENCOUNTER — Ambulatory Visit: Payer: Medicare Other | Admitting: Physical Therapy

## 2018-05-07 ENCOUNTER — Encounter: Payer: Self-pay | Admitting: Physical Therapy

## 2018-05-07 DIAGNOSIS — G8929 Other chronic pain: Secondary | ICD-10-CM

## 2018-05-07 DIAGNOSIS — M25511 Pain in right shoulder: Principal | ICD-10-CM

## 2018-05-07 DIAGNOSIS — M25611 Stiffness of right shoulder, not elsewhere classified: Secondary | ICD-10-CM

## 2018-05-07 NOTE — Therapy (Signed)
[facility] [place], [place], [address] Phone: [phone]   Fax:  [phone]  Physical Therapy Treatment  Patient Details  Name: Misty Smith MRN: [id number] Date of Birth: [birth year] Referring Provider: Esmond Plants MD   Encounter Date: 05/07/2018  PT End of Session - 05/07/18 0950    Visit Number  11    Number of Visits  16    Date for PT Re-Evaluation  06/30/18    PT Start Time  0945    PT Stop Time  7510    PT Time Calculation (min)  50 min    Activity Tolerance  Patient tolerated treatment well    Behavior During Therapy  [facility] for tasks assessed/performed       Past Medical History:  Diagnosis Date  . Anxiety   . Arthritis   . Bronchitis   . Cataract   . Colovaginal fistula   . Depression   . Diabetes mellitus without complication ([place])   . Dysrhythmia    palpitations, occasional PVCs; Dr Stanford Breed cardiologist  . Esophageal stricture   . Fractured elbow 1970's  . GERD (gastroesophageal reflux disease)   . H/O hiatal hernia   . Hyperlipidemia   . Hypertension   . Perimenopausal vasomotor symptoms   . Postmenopausal HRT (hormone replacement therapy)   . Pre-diabetes   . Stress   . Subacute lupus erythematosus    Dr. Sol Blazing      Past Surgical History:  Procedure Laterality Date  . abdominal bypass  1987   fibroids   . ABDOMINAL HYSTERECTOMY     partial  . bilateral tubal ligtation  1972  . CHOLECYSTECTOMY N/A 03/21/2014   Procedure: LAPAROSCOPIC CHOLECYSTECTOMY WITH INTRAOPERATIVE CHOLANGIOGRAM;  Surgeon: Shann Medal, MD;  Location: [place];  Service: General;  Laterality: N/A;  . COLONOSCOPY    . elbow Right 1976   surgery after fracture of elbow  . EYE SURGERY Bilateral    cataracts  . TONSILLECTOMY    . TUBAL LIGATION      There were no vitals filed for this visit.  Subjective Assessment - 05/07/18 0948    Subjective  Reports that MD pleased. MD wrote on printed MD note that he was  pleased with progress and she was okay for IR behind the back as well as PREs with elbow close.    Pertinent History  DM; Right elbow fracture.    Patient Stated Goals  Use right UE again.    Currently in Pain?  Yes    Pain Score  1     Pain Location  Shoulder    Pain Orientation  Right    Pain Descriptors / Indicators  Discomfort    Pain Type  Surgical pain    Pain Onset  1 to 4 weeks ago         [facility] PT Assessment - 05/07/18 0001      Assessment   Medical Diagnosis  Right RCR.    Onset Date/Surgical Date  03/29/18    Next MD Visit  06/02/2018      Restrictions   Weight Bearing Restrictions  No                   [facility] Adult PT Treatment/Exercise - 05/07/18 0001      Exercises   Exercises  Shoulder      Shoulder Exercises: Seated   Other Seated Exercises  Seated UE ranger into flexion, CW and CCW circles x20  reps each      Shoulder Exercises: Standing   External Rotation  Strengthening;Right;15 reps;Theraband    Theraband Level (Shoulder External Rotation)  Level 1 (Yellow)    Internal Rotation  Strengthening;Right;15 reps;Theraband    Theraband Level (Shoulder Internal Rotation)  Level 1 (Yellow)      Shoulder Exercises: Pulleys   Flexion  3 minutes      Shoulder Exercises: Isometric Strengthening   Flexion  5X10"    Extension  5X10"    External Rotation  5X10"    Internal Rotation  5X10"      Shoulder Exercises: Stretch   Internal Rotation Stretch  4 reps    Internal Rotation Stretch Limitations  30 sec      Modalities   Modalities  Electrical Stimulation;Vasopneumatic      Electrical Stimulation   Electrical Stimulation Location  R shoulder    Electrical Stimulation Action  IFC    Electrical Stimulation Parameters  80-150 hz x15 min    Electrical Stimulation Goals  Pain      Vasopneumatic   Number Minutes Vasopneumatic   15 minutes    Vasopnuematic Location   Shoulder    Vasopneumatic Pressure  Low    Vasopneumatic Temperature   34       Manual Therapy   Manual Therapy  Passive ROM    Passive ROM  Gentle PAAROM of R shoulder into flex, ER, IR with oscillations and gentle holds at end range to improve ROM               PT Short Term Goals - 04/01/18 1328      PT SHORT TERM GOAL #1   Title  STG's=LTG's.        PT Long Term Goals - 04/26/18 1027      PT LONG TERM GOAL #1   Title  Independent with a HEP.    Time  6    Period  Weeks    Status  Achieved      PT LONG TERM GOAL #2   Title  Active right shoulder flexion to 145 degrees so the patient can easily reach overhead.    Time  6    Period  Weeks    Status  On-going      PT LONG TERM GOAL #3   Title  Active ER to 70 degrees+ to allow for easily donning/doffing of apparel.    Time  6    Period  Weeks    Status  On-going      PT LONG TERM GOAL #4   Title  Increase right shoulder strength to a solid 4/5 to increase stability for performance of functional activities.    Time  6    Period  Weeks    Status  On-going      PT LONG TERM GOAL #5   Title  Increase ROM so patient is able to reach behind back to L3.    Time  6    Period  Weeks    Status  On-going      PT LONG TERM GOAL #6   Title  Perform ADL's with pain not > 3/10.    Time  6    Period  Weeks    Status  On-going            Plan - 05/07/18 1032    Clinical Impression Statement  Patient tolerated today's treatment well as she was progressed per MDs written notes. Patient tolerated  new exercises fairly well although intermittant catching sensation reported in R shoulder. Patient encouraged to complete exercises at slow pace and constantly supervised with PREs to monitor technique. Moderate VCs and tactile cueing required to emphasize proper technique. Firm end feels and smooth arc of motion noted with PROM of R shoulder. Normal modalities response noted following removal of the modalities.    PT Frequency  2x / week    PT Duration  6 weeks    PT Treatment/Interventions   ADLs/Self Care Home Management;Cryotherapy;Electrical Stimulation;Ultrasound;Therapeutic exercise;Therapeutic activities;Patient/family education;Passive range of motion;Manual techniques;Vasopneumatic Device;Visual/perceptual remediation/compensation    PT Next Visit Plan  Continue with AAROM and gentle PREs per MD note.    PT Home Exercise Plan  shoulder isometrics, AAROM shoulder flexion and ER    Consulted and Agree with Plan of Care  Patient       Patient will benefit from skilled therapeutic intervention in order to improve the following deficits and impairments:  Decreased activity tolerance, Decreased range of motion, Pain  Visit Diagnosis: Chronic right shoulder pain  Stiffness of right shoulder, not elsewhere classified     Problem List Patient Active Problem List   Diagnosis Date Noted  . Coronary artery disease due to lipid rich plaque 03/18/2016  . Anxiety state 03/18/2016  . Vitamin D deficiency 03/18/2016  . 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 CAD (coronary artery disease) 12/30/2013  . 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 Osteopenia of the elderly 10/26/2013  . Generalized anxiety disorder 09/21/2013  . GERD (gastroesophageal reflux disease) 09/21/2013  . Pulmonary nodule seen on imaging study 09/21/2013  . SYNCOPE 02/02/2009  . Pre-diabetes 02/01/2009  . Hyperlipidemia 02/01/2009  . Essential hypertension 02/01/2009  . DIZZINESS 02/01/2009  . PALPITATIONS 02/01/2009    Standley Brooking, PTA 05/07/2018, 10:43 AM  [facility] [address] [place], [place], [address] Phone: [phone]   Fax:  [phone]  Name: JAZZMYNE RASNICK MRN: [id number] Date of Birth: 1940-12-26

## 2018-05-10 ENCOUNTER — Encounter: Payer: Self-pay | Admitting: Physical Therapy

## 2018-05-10 ENCOUNTER — Ambulatory Visit: Payer: Medicare Other | Admitting: Physical Therapy

## 2018-05-10 DIAGNOSIS — M25611 Stiffness of right shoulder, not elsewhere classified: Secondary | ICD-10-CM

## 2018-05-10 DIAGNOSIS — G8929 Other chronic pain: Secondary | ICD-10-CM | POA: Diagnosis not present

## 2018-05-10 DIAGNOSIS — M25511 Pain in right shoulder: Principal | ICD-10-CM

## 2018-05-10 NOTE — Therapy (Signed)
[facility] [place], [place], [address] Phone: [phone]   Fax:  [phone]  Physical Therapy Treatment  Patient Details  Name: Misty Smith MRN: [id number] Date of Birth: 1940-10-19 Referring Provider: Esmond Plants MD   Encounter Date: 05/10/2018  PT End of Session - 05/10/18 0817    Visit Number  12    Number of Visits  16    Date for PT Re-Evaluation  06/30/18    PT Start Time  0815    PT Stop Time  0906    PT Time Calculation (min)  51 min    Activity Tolerance  Patient tolerated treatment well    Behavior During Therapy  [facility] for tasks assessed/performed       Past Medical History:  Diagnosis Date  . Anxiety   . Arthritis   . Bronchitis   . Cataract   . Colovaginal fistula   . Depression   . Diabetes mellitus without complication ([place])   . Dysrhythmia    palpitations, occasional PVCs; Dr Stanford Breed cardiologist  . Esophageal stricture   . Fractured elbow 1970's  . GERD (gastroesophageal reflux disease)   . H/O hiatal hernia   . Hyperlipidemia   . Hypertension   . Perimenopausal vasomotor symptoms   . Postmenopausal HRT (hormone replacement therapy)   . Pre-diabetes   . Stress   . Subacute lupus erythematosus    Dr. Sol Blazing      Past Surgical History:  Procedure Laterality Date  . abdominal bypass  1987   fibroids   . ABDOMINAL HYSTERECTOMY     partial  . bilateral tubal ligtation  1972  . CHOLECYSTECTOMY N/A 03/21/2014   Procedure: LAPAROSCOPIC CHOLECYSTECTOMY WITH INTRAOPERATIVE CHOLANGIOGRAM;  Surgeon: Shann Medal, MD;  Location: [place];  Service: General;  Laterality: N/A;  . COLONOSCOPY    . elbow Right 1976   surgery after fracture of elbow  . EYE SURGERY Bilateral    cataracts  . TONSILLECTOMY    . TUBAL LIGATION      There were no vitals filed for this visit.  Subjective Assessment - 05/10/18 0815    Subjective  Reports that she has some soreness but reports that may be from  driving as she was approved for local driving by MD.    Pertinent History  DM; Right elbow fracture.    Patient Stated Goals  Use right UE again.    Currently in Pain?  Yes    Pain Score  1     Pain Location  Shoulder    Pain Orientation  Right    Pain Descriptors / Indicators  Sore    Pain Type  Surgical pain    Pain Onset  1 to 4 weeks ago    Pain Frequency  Intermittent         [facility] PT Assessment - 05/10/18 0001      Assessment   Medical Diagnosis  Right RCR.    Onset Date/Surgical Date  03/29/18    Next MD Visit  06/02/2018      Restrictions   Weight Bearing Restrictions  No      ROM / Strength   AROM / PROM / Strength  AROM      AROM   Overall AROM   Deficits    AROM Assessment Site  Shoulder    Right/Left Shoulder  Right    Right Shoulder Flexion  115 Degrees    Right Shoulder External Rotation  55  Degrees                   [facility] Adult PT Treatment/Exercise - 05/10/18 0001      Exercises   Exercises  Shoulder      Shoulder Exercises: Supine   Protraction  AAROM;Both;20 reps    External Rotation  AAROM;Right;20 reps    Flexion  AAROM;Both;20 reps      Shoulder Exercises: Prone   Retraction  AROM;Right;20 reps    Extension  AROM;Right;20 reps      Shoulder Exercises: Standing   External Rotation  Strengthening;Right;20 reps;Theraband    Theraband Level (Shoulder External Rotation)  Level 1 (Yellow)    Internal Rotation  Strengthening;Right;20 reps;Theraband    Theraband Level (Shoulder Internal Rotation)  Level 1 (Yellow)    Other Standing Exercises  RUE ranger into flexion, CW and CCW x20 reps each      Shoulder Exercises: Pulleys   Flexion  5 minutes      Modalities   Modalities  Electrical Stimulation;Vasopneumatic      Electrical Stimulation   Electrical Stimulation Location  R shoulder    Electrical Stimulation Action  Pre-Mod    Electrical Stimulation Parameters  80-150 hz x15 min    Electrical Stimulation Goals  Pain       Vasopneumatic   Number Minutes Vasopneumatic   15 minutes    Vasopnuematic Location   Shoulder    Vasopneumatic Pressure  Low    Vasopneumatic Temperature   34      Manual Therapy   Manual Therapy  Passive ROM    Passive ROM  Gentle PAAROM of R shoulder into flex, ER with oscillations and gentle holds at end range to improve ROM               PT Short Term Goals - 04/01/18 1328      PT SHORT TERM GOAL #1   Title  STG's=LTG's.        PT Long Term Goals - 04/26/18 1027      PT LONG TERM GOAL #1   Title  Independent with a HEP.    Time  6    Period  Weeks    Status  Achieved      PT LONG TERM GOAL #2   Title  Active right shoulder flexion to 145 degrees so the patient can easily reach overhead.    Time  6    Period  Weeks    Status  On-going      PT LONG TERM GOAL #3   Title  Active ER to 70 degrees+ to allow for easily donning/doffing of apparel.    Time  6    Period  Weeks    Status  On-going      PT LONG TERM GOAL #4   Title  Increase right shoulder strength to a solid 4/5 to increase stability for performance of functional activities.    Time  6    Period  Weeks    Status  On-going      PT LONG TERM GOAL #5   Title  Increase ROM so patient is able to reach behind back to L3.    Time  6    Period  Weeks    Status  On-going      PT LONG TERM GOAL #6   Title  Perform ADL's with pain not > 3/10.    Time  6    Period  Weeks  Status  On-going            Plan - 05/10/18 0908    Clinical Impression Statement  Patient tolerated today's treatment well as she arrived with only minimal R shoulder soreness. Patient guided through gentle stretches, ROM and strengthening exercises per MD requests with moderate verbal and tactile cues required for appopriate technique. No complaints by patient treatment of any increased pain. Slow and careful pace instructed for patient to avoid any increased pain. Firm end feels and smooth arc of motion noted with PAAROM.  Normal modalities response noted following removal of the modalities.    PT Frequency  2x / week    PT Duration  6 weeks    PT Treatment/Interventions  ADLs/Self Care Home Management;Cryotherapy;Electrical Stimulation;Ultrasound;Therapeutic exercise;Therapeutic activities;Patient/family education;Passive range of motion;Manual techniques;Vasopneumatic Device;Visual/perceptual remediation/compensation    PT Next Visit Plan  Continue with AAROM and gentle PREs per MD note.    PT Home Exercise Plan  shoulder isometrics, AAROM shoulder flexion and ER    Consulted and Agree with Plan of Care  Patient       Patient will benefit from skilled therapeutic intervention in order to improve the following deficits and impairments:  Decreased activity tolerance, Decreased range of motion, Pain  Visit Diagnosis: Chronic right shoulder pain  Stiffness of right shoulder, not elsewhere classified     Problem List Patient Active Problem List   Diagnosis Date Noted  . Coronary artery disease due to lipid rich plaque 03/18/2016  . Anxiety state 03/18/2016  . Vitamin D deficiency 03/18/2016  . 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 CAD (coronary artery disease) 12/30/2013  . 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 Osteopenia of the elderly 10/26/2013  . Generalized anxiety disorder 09/21/2013  . GERD (gastroesophageal reflux disease) 09/21/2013  . Pulmonary nodule seen on imaging study 09/21/2013  . SYNCOPE 02/02/2009  . Pre-diabetes 02/01/2009  . Hyperlipidemia 02/01/2009  . Essential hypertension 02/01/2009  . DIZZINESS 02/01/2009  . PALPITATIONS 02/01/2009    Standley Brooking, PTA 05/10/2018, 9:14 AM  [facility] [address] [place], [place], [address] Phone: [phone]   Fax:  [phone]  Name: Misty Smith MRN: [id number] Date of Birth: 12/11/1940

## 2018-05-11 ENCOUNTER — Other Ambulatory Visit: Payer: Medicare Other

## 2018-05-11 DIAGNOSIS — E559 Vitamin D deficiency, unspecified: Secondary | ICD-10-CM | POA: Diagnosis not present

## 2018-05-11 DIAGNOSIS — E7849 Other hyperlipidemia: Secondary | ICD-10-CM

## 2018-05-11 DIAGNOSIS — I10 Essential (primary) hypertension: Secondary | ICD-10-CM | POA: Diagnosis not present

## 2018-05-11 DIAGNOSIS — R7303 Prediabetes: Secondary | ICD-10-CM

## 2018-05-12 LAB — LIPID PANEL
Chol/HDL Ratio: 3.2 ratio (ref 0.0–4.4)
Cholesterol, Total: 165 mg/dL (ref 100–199)
HDL: 51 mg/dL (ref >39)
LDL Calculated: 83 mg/dL (ref 0–99)
TRIGLYCERIDES: 154 mg/dL — AB (ref 0–149)
VLDL CHOLESTEROL CAL: 31 mg/dL (ref 5–40)

## 2018-05-12 LAB — CBC WITH DIFFERENTIAL/PLATELET
BASOS ABS: 0 10*3/uL (ref 0.0–0.2)
Basos: 1 %
EOS (ABSOLUTE): 0.3 10*3/uL (ref 0.0–0.4)
Eos: 5 %
HEMOGLOBIN: 12.1 g/dL (ref 11.1–15.9)
Hematocrit: 35.1 % (ref 34.0–46.6)
IMMATURE GRANS (ABS): 0 10*3/uL (ref 0.0–0.1)
Immature Granulocytes: 0 %
LYMPHS: 31 %
Lymphocytes Absolute: 1.8 10*3/uL (ref 0.7–3.1)
MCH: 31.7 pg (ref 26.6–33.0)
MCHC: 34.5 g/dL (ref 31.5–35.7)
MCV: 92 fL (ref 79–97)
MONOCYTES: 13 %
Monocytes Absolute: 0.8 10*3/uL (ref 0.1–0.9)
NEUTROS PCT: 50 %
Neutrophils Absolute: 2.9 10*3/uL (ref 1.4–7.0)
PLATELETS: 248 10*3/uL (ref 150–450)
RBC: 3.82 x10E6/uL (ref 3.77–5.28)
RDW: 14.4 % (ref 12.3–15.4)
WBC: 5.7 10*3/uL (ref 3.4–10.8)

## 2018-05-12 LAB — HEPATIC FUNCTION PANEL
ALK PHOS: 68 IU/L (ref 39–117)
ALT: 21 IU/L (ref 0–32)
AST: 30 IU/L (ref 0–40)
Albumin: 3.9 g/dL (ref 3.5–4.8)
BILIRUBIN, DIRECT: 0.1 mg/dL (ref 0.00–0.40)
Bilirubin Total: 0.3 mg/dL (ref 0.0–1.2)
Total Protein: 6.6 g/dL (ref 6.0–8.5)

## 2018-05-12 LAB — BMP8+EGFR
BUN / CREAT RATIO: 19 (ref 12–28)
BUN: 13 mg/dL (ref 8–27)
CHLORIDE: 97 mmol/L (ref 96–106)
CO2: 24 mmol/L (ref 20–29)
CREATININE: 0.7 mg/dL (ref 0.57–1.00)
Calcium: 9.6 mg/dL (ref 8.7–10.3)
GFR calc Af Amer: 95 mL/min/{1.73_m2} (ref >59)
GFR calc non Af Amer: 83 mL/min/{1.73_m2} (ref >59)
Glucose: 97 mg/dL (ref 65–99)
Potassium: 4.5 mmol/L (ref 3.5–5.2)
SODIUM: 137 mmol/L (ref 134–144)

## 2018-05-12 LAB — VITAMIN D 25 HYDROXY (VIT D DEFICIENCY, FRACTURES): VIT D 25 HYDROXY: 46.6 ng/mL (ref 30.0–100.0)

## 2018-05-13 ENCOUNTER — Ambulatory Visit: Payer: Medicare Other | Admitting: Physical Therapy

## 2018-05-13 DIAGNOSIS — M25611 Stiffness of right shoulder, not elsewhere classified: Secondary | ICD-10-CM | POA: Diagnosis not present

## 2018-05-13 DIAGNOSIS — M25511 Pain in right shoulder: Secondary | ICD-10-CM | POA: Diagnosis not present

## 2018-05-13 DIAGNOSIS — G8929 Other chronic pain: Secondary | ICD-10-CM | POA: Diagnosis not present

## 2018-05-13 NOTE — Therapy (Signed)
[facility], [place], [address] Phone: [phone]   Fax:  [phone]  Physical Therapy Treatment  Patient Details  Name: Misty Smith MRN: [id number] Date of Birth: 06/07/1941 Referring Provider: Esmond Plants MD   Encounter Date: 05/13/2018  PT End of Session - 05/13/18 0916    Visit Number  13    Number of Visits  16    Date for PT Re-Evaluation  06/30/18    PT Start Time  0900    PT Stop Time  0950    PT Time Calculation (min)  50 min    Activity Tolerance  Patient tolerated treatment well    Behavior During Therapy  [facility] for tasks assessed/performed       Past Medical History:  Diagnosis Date  . Anxiety   . Arthritis   . Bronchitis   . Cataract   . Colovaginal fistula   . Depression   . Diabetes mellitus without complication ([place])   . Dysrhythmia    palpitations, occasional PVCs; Dr Stanford Breed cardiologist  . Esophageal stricture   . Fractured elbow 1970's  . GERD (gastroesophageal reflux disease)   . H/O hiatal hernia   . Hyperlipidemia   . Hypertension   . Perimenopausal vasomotor symptoms   . Postmenopausal HRT (hormone replacement therapy)   . Pre-diabetes   . Stress   . Subacute lupus erythematosus    Dr. Sol Blazing      Past Surgical History:  Procedure Laterality Date  . abdominal bypass  1987   fibroids   . ABDOMINAL HYSTERECTOMY     partial  . bilateral tubal ligtation  1972  . CHOLECYSTECTOMY N/A 03/21/2014   Procedure: LAPAROSCOPIC CHOLECYSTECTOMY WITH INTRAOPERATIVE CHOLANGIOGRAM;  Surgeon: Shann Medal, MD;  Location: [place];  Service: General;  Laterality: N/A;  . COLONOSCOPY    . elbow Right 1976   surgery after fracture of elbow  . EYE SURGERY Bilateral    cataracts  . TONSILLECTOMY    . TUBAL LIGATION      There were no vitals filed for this visit.  Subjective Assessment - 05/13/18 0912    Subjective  Pt arriving to therapy reporting soreness of 2/10 in her R  shoulder. Pt reporting she has been able to do more at home.     Pertinent History  DM; Right elbow fracture.    Patient Stated Goals  Use right UE again.    Currently in Pain?  Yes    Pain Score  2     Pain Location  Shoulder    Pain Orientation  Right    Pain Descriptors / Indicators  Sore    Pain Onset  1 to 4 weeks ago    Pain Frequency  Intermittent    Aggravating Factors   movements    Pain Relieving Factors  resting         [facility] PT Assessment - 05/13/18 0001      AROM   Overall AROM   Deficits    AROM Assessment Site  Shoulder    Right/Left Shoulder  Right    Right Shoulder Flexion  125 Degrees    Right Shoulder External Rotation  60 Degrees      PROM   Right Shoulder Flexion  135 Degrees    Right Shoulder External Rotation  64 Degrees                   [facility] Adult PT Treatment/Exercise - 05/13/18  0001      Exercises   Exercises  Shoulder      Shoulder Exercises: Supine   Protraction  AAROM;Both;20 reps    External Rotation  AAROM;Right;20 reps    Flexion  AAROM;Both;20 reps      Shoulder Exercises: Prone   Retraction  AROM;Right;20 reps    Extension  AROM;Right;20 reps      Shoulder Exercises: Standing   External Rotation  Strengthening;Right;20 reps;Theraband    Theraband Level (Shoulder External Rotation)  Level 1 (Yellow)    Internal Rotation  Strengthening;Right;20 reps;Theraband    Theraband Level (Shoulder Internal Rotation)  Level 1 (Yellow)    Flexion  Strengthening;12 reps    Row  AROM;Strengthening;12 reps    Other Standing Exercises  RUE ranger into flexion, CW and CCW x20 reps each      Shoulder Exercises: Pulleys   Flexion  3 minutes      Modalities   Modalities  Electrical Stimulation;Vasopneumatic      Electrical Stimulation   Electrical Stimulation Location  R shoulder    Electrical Stimulation Action  Pre-mod    Electrical Stimulation Parameters  80-150 Hz x 15 minutes     Electrical Stimulation Goals  Pain       Vasopneumatic   Number Minutes Vasopneumatic   15 minutes    Vasopnuematic Location   Shoulder    Vasopneumatic Pressure  Medium    Vasopneumatic Temperature   34      Manual Therapy   Manual Therapy  Passive ROM    Passive ROM  PROM in all planes with oscillatrions to help improve tone and end rangel             PT Education - 05/13/18 0916    Education Details  techniques with each exercise    Person(s) Educated  Patient    Methods  Explanation;Demonstration;Verbal cues    Comprehension  Verbalized understanding;Returned demonstration       PT Short Term Goals - 05/13/18 0952      PT SHORT TERM GOAL #1   Title  STG's=LTG's.        PT Long Term Goals - 05/13/18 6629      PT LONG TERM GOAL #1   Title  Independent with a HEP.    Time  6    Period  Weeks    Status  Achieved      PT LONG TERM GOAL #2   Title  Active right shoulder flexion to 145 degrees so the patient can easily reach overhead.    Time  6    Period  Weeks    Status  On-going      PT LONG TERM GOAL #3   Title  Active ER to 70 degrees+ to allow for easily donning/doffing of apparel.    Time  6    Period  Weeks    Status  On-going      PT LONG TERM GOAL #4   Title  Increase right shoulder strength to a solid 4/5 to increase stability for performance of functional activities.    Time  6    Period  Weeks    Status  On-going      PT LONG TERM GOAL #5   Title  Increase ROM so patient is able to reach behind back to L3.    Time  6    Period  Weeks    Status  On-going      PT LONG TERM GOAL #  6   Title  Perform ADL's with pain not > 3/10.    Time  6    Period  Weeks            Plan - 05/13/18 4825    Clinical Impression Statement  Pt tolerating all exericses well today. Pt with increased pain to 4/10 with PROM into flexion and ER. Pt reporting improved function at home and beginning light household chores. Pt making progress with AROM in R shoulder. We added table slides/stretches to  pt's HEP for flexion and ER. Continue skilled PT  to progress toward goals set.     PT Frequency  2x / week    PT Treatment/Interventions  ADLs/Self Care Home Management;Cryotherapy;Electrical Stimulation;Ultrasound;Therapeutic exercise;Therapeutic activities;Patient/family education;Passive range of motion;Manual techniques;Vasopneumatic Device;Visual/perceptual remediation/compensation    PT Next Visit Plan  Continue with AAROM and gentle PREs per MD note.    PT Home Exercise Plan  shoulder isometrics, AAROM shoulder flexion and ER    Consulted and Agree with Plan of Care  Patient       Patient will benefit from skilled therapeutic intervention in order to improve the following deficits and impairments:  Decreased activity tolerance, Decreased range of motion, Pain  Visit Diagnosis: Chronic right shoulder pain  Stiffness of right shoulder, not elsewhere classified     Problem List Patient Active Problem List   Diagnosis Date Noted  . Coronary artery disease due to lipid rich plaque 03/18/2016  . Anxiety state 03/18/2016  . Vitamin D deficiency 03/18/2016  . 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 CAD (coronary artery disease) 12/30/2013  . 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 Osteopenia of the elderly 10/26/2013  . Generalized anxiety disorder 09/21/2013  . GERD (gastroesophageal reflux disease) 09/21/2013  . Pulmonary nodule seen on imaging study 09/21/2013  . SYNCOPE 02/02/2009  . Pre-diabetes 02/01/2009  . Hyperlipidemia 02/01/2009  . Essential hypertension 02/01/2009  . DIZZINESS 02/01/2009  . PALPITATIONS 02/01/2009    Oretha Caprice, MPT 05/13/2018, 9:54 AM  [facility] [address] [place], [place], [address] Phone: [phone]   Fax:  [phone]  Name: Misty Smith MRN: [id number] Date of Birth: 1941-08-31

## 2018-05-17 ENCOUNTER — Ambulatory Visit: Payer: Medicare Other | Admitting: Physical Therapy

## 2018-05-17 ENCOUNTER — Encounter: Payer: Self-pay | Admitting: Physical Therapy

## 2018-05-17 DIAGNOSIS — M25611 Stiffness of right shoulder, not elsewhere classified: Secondary | ICD-10-CM

## 2018-05-17 DIAGNOSIS — M25511 Pain in right shoulder: Principal | ICD-10-CM

## 2018-05-17 DIAGNOSIS — G8929 Other chronic pain: Secondary | ICD-10-CM

## 2018-05-17 NOTE — Therapy (Signed)
[facility] [place], [place], [address] Phone: [phone]   Fax:  [phone]  Physical Therapy Treatment  Patient Details  Name: Misty Smith MRN: [id number] Date of Birth: [birth year] Referring Provider: Esmond Plants MD   Encounter Date: 05/17/2018  PT End of Session - 05/17/18 0940    Visit Number  14    Number of Visits  16    Date for PT Re-Evaluation  06/30/18    PT Start Time  0900    PT Stop Time  0956    PT Time Calculation (min)  56 min    Activity Tolerance  Patient tolerated treatment well    Behavior During Therapy  [facility] for tasks assessed/performed       Past Medical History:  Diagnosis Date  . Anxiety   . Arthritis   . Bronchitis   . Cataract   . Colovaginal fistula   . Depression   . Diabetes mellitus without complication ([place])   . Dysrhythmia    palpitations, occasional PVCs; Dr Stanford Breed cardiologist  . Esophageal stricture   . Fractured elbow 1970's  . GERD (gastroesophageal reflux disease)   . H/O hiatal hernia   . Hyperlipidemia   . Hypertension   . Perimenopausal vasomotor symptoms   . Postmenopausal HRT (hormone replacement therapy)   . Pre-diabetes   . Stress   . Subacute lupus erythematosus    Dr. Sol Blazing      Past Surgical History:  Procedure Laterality Date  . abdominal bypass  1987   fibroids   . ABDOMINAL HYSTERECTOMY     partial  . bilateral tubal ligtation  1972  . CHOLECYSTECTOMY N/A 03/21/2014   Procedure: LAPAROSCOPIC CHOLECYSTECTOMY WITH INTRAOPERATIVE CHOLANGIOGRAM;  Surgeon: Shann Medal, MD;  Location: [facility];  Service: General;  Laterality: N/A;  . COLONOSCOPY    . elbow Right 1976   surgery after fracture of elbow  . EYE SURGERY Bilateral    cataracts  . TONSILLECTOMY    . TUBAL LIGATION      There were no vitals filed for this visit.  Subjective Assessment - 05/17/18 0902    Subjective  Patient reported some soreness in shoulder yet overall  improvement    Pertinent History  DM; Right elbow fracture.    Patient Stated Goals  Use right UE again.    Currently in Pain?  Yes    Pain Score  2     Pain Location  Shoulder    Pain Orientation  Right    Pain Descriptors / Indicators  Sore    Pain Type  Surgical pain    Pain Onset  More than a month ago    Pain Frequency  Intermittent    Aggravating Factors   movement overhead    Pain Relieving Factors  at rest         [facility] PT Assessment - 05/17/18 0001      AROM   AROM Assessment Site  Shoulder    Right/Left Shoulder  Right    Right Shoulder Flexion  125 Degrees    Right Shoulder External Rotation  62 Degrees      PROM   PROM Assessment Site  Shoulder    Right/Left Shoulder  Right    Right Shoulder Flexion  140 Degrees    Right Shoulder External Rotation  67 Degrees                   [facility] Adult PT Treatment/Exercise -  05/17/18 0001      Exercises   Exercises  Shoulder      Shoulder Exercises: Supine   Flexion  AAROM;Both;20 reps      Shoulder Exercises: Standing   Protraction  Strengthening;Right;20 reps;Theraband    Theraband Level (Shoulder Protraction)  Level 1 (Yellow)    Protraction Limitations  small range arm by side    External Rotation  Strengthening;Right;20 reps;Theraband    Theraband Level (Shoulder External Rotation)  Level 1 (Yellow)    Internal Rotation  Strengthening;Right;20 reps;Theraband    Theraband Level (Shoulder Internal Rotation)  Level 1 (Yellow)    Retraction  Strengthening;Right;20 reps;Theraband    Theraband Level (Shoulder Retraction)  Level 1 (Yellow)    Retraction Limitations  small range arm by side    Other Standing Exercises  RUE ranger into flexion, CW and CCW x20 reps each      Shoulder Exercises: Pulleys   Flexion  5 minutes    Other Pulley Exercises  wall walk x58min      Electrical Stimulation   Electrical Stimulation Location  R shoulder    Electrical Stimulation Action  premod    Electrical  Stimulation Parameters  80-150hz  x59min    Electrical Stimulation Goals  Pain      Vasopneumatic   Number Minutes Vasopneumatic   15 minutes    Vasopnuematic Location   Shoulder    Vasopneumatic Pressure  Medium      Manual Therapy   Manual Therapy  Passive ROM    Passive ROM  PAAROM in all planes with oscillatrions to help improve ROM, then rhythmic stabs for IR/ER in scaption and flex/ext at 90 degrees                PT Short Term Goals - 05/13/18 0952      PT SHORT TERM GOAL #1   Title  STG's=LTG's.        PT Long Term Goals - 05/17/18 5631      PT LONG TERM GOAL #1   Title  Independent with a HEP.    Time  6    Period  Weeks    Status  Achieved      PT LONG TERM GOAL #2   Title  Active right shoulder flexion to 145 degrees so the patient can easily reach overhead.    Time  6    Period  Weeks    Status  On-going   AROM 125 degrees 05/17/18     PT LONG TERM GOAL #3   Title  Active ER to 70 degrees+ to allow for easily donning/doffing of apparel.    Period  Weeks    Status  On-going   AROM 62 degrees 05/17/18     PT LONG TERM GOAL #4   Title  Increase right shoulder strength to a solid 4/5 to increase stability for performance of functional activities.    Time  6    Period  Weeks    Status  On-going      PT LONG TERM GOAL #5   Title  Increase ROM so patient is able to reach behind back to L3.    Time  6    Status  On-going   NT 05/17/18     PT LONG TERM GOAL #6   Title  Perform ADL's with pain not > 3/10.    Time  6    Status  On-going  Plan - 05/17/18 0941    Clinical Impression Statement  Patient tolerated treatment well today. Patient reported no increased soreness with any exercises and able to progress with ROM today in right shoulder. Patient reported some ongoing soreness with any overhead reaching. Patient reported some soreness after dusting with her right arm, reviewed protocol with patient to avoid re-injury. Goals ongoing  at this time.     Clinical Impairments Affecting Rehab Potential  surgery 03/29/18 current 7 weeks 05/17/18    PT Frequency  2x / week    PT Duration  6 weeks    PT Treatment/Interventions  ADLs/Self Care Home Management;Cryotherapy;Electrical Stimulation;Ultrasound;Therapeutic exercise;Therapeutic activities;Patient/family education;Passive range of motion;Manual techniques;Vasopneumatic Device;Visual/perceptual remediation/compensation    PT Next Visit Plan  Continue with AAROM and gentle PREs per MD note. (MD 06/02/18)    Consulted and Agree with Plan of Care  Patient       Patient will benefit from skilled therapeutic intervention in order to improve the following deficits and impairments:  Decreased activity tolerance, Decreased range of motion, Pain  Visit Diagnosis: Chronic right shoulder pain  Stiffness of right shoulder, not elsewhere classified     Problem List Patient Active Problem List   Diagnosis Date Noted  . Coronary artery disease due to lipid rich plaque 03/18/2016  . Anxiety state 03/18/2016  . Vitamin D deficiency 03/18/2016  . 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 CAD (coronary artery disease) 12/30/2013  . 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 Osteopenia of the elderly 10/26/2013  . Generalized anxiety disorder 09/21/2013  . GERD (gastroesophageal reflux disease) 09/21/2013  . Pulmonary nodule seen on imaging study 09/21/2013  . SYNCOPE 02/02/2009  . Pre-diabetes 02/01/2009  . Hyperlipidemia 02/01/2009  . Essential hypertension 02/01/2009  . DIZZINESS 02/01/2009  . PALPITATIONS 02/01/2009    Phillips Climes, PTA 05/17/2018, 9:56 AM  [facility] [place], [place], [address] Phone: [phone]   Fax:  [phone]  Name: Misty Smith MRN: [id number] Date of Birth: 04-Jul-1941

## 2018-05-19 ENCOUNTER — Encounter: Payer: Self-pay | Admitting: Family Medicine

## 2018-05-19 ENCOUNTER — Ambulatory Visit (INDEPENDENT_AMBULATORY_CARE_PROVIDER_SITE_OTHER): Payer: Medicare Other | Admitting: Family Medicine

## 2018-05-19 VITALS — BP 135/68 | HR 50 | Temp 96.8°F | Ht 60.0 in | Wt 172.0 lb

## 2018-05-19 DIAGNOSIS — M75121 Complete rotator cuff tear or rupture of right shoulder, not specified as traumatic: Secondary | ICD-10-CM

## 2018-05-19 DIAGNOSIS — I10 Essential (primary) hypertension: Secondary | ICD-10-CM | POA: Diagnosis not present

## 2018-05-19 DIAGNOSIS — E7849 Other hyperlipidemia: Secondary | ICD-10-CM | POA: Diagnosis not present

## 2018-05-19 DIAGNOSIS — I2583 Coronary atherosclerosis due to lipid rich plaque: Secondary | ICD-10-CM | POA: Diagnosis not present

## 2018-05-19 DIAGNOSIS — I2510 Atherosclerotic heart disease of native coronary artery without angina pectoris: Secondary | ICD-10-CM

## 2018-05-19 DIAGNOSIS — R7303 Prediabetes: Secondary | ICD-10-CM | POA: Diagnosis not present

## 2018-05-19 DIAGNOSIS — E559 Vitamin D deficiency, unspecified: Secondary | ICD-10-CM

## 2018-05-19 NOTE — Progress Notes (Signed)
Subjective:    Patient ID: Misty Smith, female    DOB: [birth year], 77 y.o.   MRN: [id number]  HPI Pt here for follow up and management of chronic medical problems which includes hypertension and hyperlipidemia. She is taking medication regularly.  The patient is doing well overall.  She has had lab work done and this will be reviewed with her during the visit today.  She has had right shoulder surgery by Dr. Alma Friendly and prior to that had a cardiac clearance for the shoulder surgery.  Since that shoulder surgery she has had ongoing visits to the outpatient rehab.  Lab work included a traditional lipid panel which had an LDL-C that was good at 83 and HDL good at 51 and an elevated triglyceride at 154.  The CBC had a normal white blood cell count a good hemoglobin at 12.1 and an adequate platelet count.  The blood sugar is good at 97 the creatinine and the electrolytes were all normal.  The vitamin D level was good at 46.6.  All liver function tests were normal.  Patient's weight is stable at 172.  Her body mass index is 33.59.  The patient is doing well following her right shoulder surgery and having part of her clavicle removed.  She is pleasant and smiling.  She is still getting physical therapy and says it makes her sore all over because she has not been as active since she is had the surgery.  She does not see Dr. Alma Friendly again until early October.  I reassured her that I thought she was doing well and to give the process some more time to recover.  She takes Tylenol for pain.  She was having some swelling in her feet but is now doing better with that.  She denies any chest pain pressure tightness or shortness of breath.  She denies any trouble with with swallowing heartburn or indigestion.  Her only issue is that she does have occasional constipation in which she takes some fiber she will get loose bowel movements.  There is not been any blood in the stool.  She is passing her water without  problems.     Patient Active Problem List   Diagnosis Date Noted  . Coronary artery disease due to lipid rich plaque 03/18/2016  . Anxiety state 03/18/2016  . Vitamin D deficiency 03/18/2016  . 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 CAD (coronary artery disease) 12/30/2013  . 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 Osteopenia of the elderly 10/26/2013  . Generalized anxiety disorder 09/21/2013  . GERD (gastroesophageal reflux disease) 09/21/2013  . Pulmonary nodule seen on imaging study 09/21/2013  . SYNCOPE 02/02/2009  . Pre-diabetes 02/01/2009  . Hyperlipidemia 02/01/2009  . Essential hypertension 02/01/2009  . DIZZINESS 02/01/2009  . PALPITATIONS 02/01/2009   Outpatient Encounter Medications as of 05/19/2018  Medication Sig  . ALPRAZolam (XANAX) 0.25 MG tablet Take 1 tablet (0.25 mg total) by mouth at bedtime as needed for anxiety.  Marland Kitchen aspirin EC 81 MG tablet Take 1 tablet (81 mg total) by mouth daily.  Marland Kitchen atorvastatin (LIPITOR) 80 MG tablet TAKE AS DIRECTED  . Calcium Carb-Cholecalciferol (CALTRATE 600+D) 600-800 MG-UNIT TABS Take 1 tablet by mouth every evening.   . cholecalciferol (VITAMIN D) 1000 UNITS tablet Take 2,000 Units by mouth daily.  . clobetasol (TEMOVATE) 0.05 % external solution Apply 1 application topically 2 (two) times daily.  Marland Kitchen desonide (DESOWEN) 0.05 % cream Apply 1 application topically 2 (two) times daily as  needed (for rash).   Marland Kitchen escitalopram (LEXAPRO) 20 MG tablet TAKE 1 TABLET (20 MG TOTAL) BY MOUTH DAILY.  Marland Kitchen esomeprazole (NEXIUM) 40 MG capsule TAKE 1 CAPSULE BY MOUTH EVERY DAY  . estradiol (ESTRACE VAGINAL) 0.1 MG/GM vaginal cream Place 2 g vaginally See admin instructions. Place 2 g vaginally 2-3 times weekly.  . hydrochlorothiazide (HYDRODIURIL) 12.5 MG tablet Take 1 tablet (12.5 mg total) by mouth daily.  Marland Kitchen ketorolac (ACULAR) 0.5 % ophthalmic solution PLACE 1 DROP INTO LEFT EYE 4 TIMES DAILY  . lisinopril (PRINIVIL,ZESTRIL) 30 MG tablet TAKE 1  TABLET (30 MG TOTAL) BY MOUTH DAILY.  . metoprolol tartrate (LOPRESSOR) 25 MG tablet Take 1 tablet (25 mg total) by mouth 2 (two) times daily.  . Multiple Vitamin (MULTIVITAMIN) tablet Take 1 tablet by mouth every morning.   Marland Kitchen omega-3 acid ethyl esters (LOVAZA) 1 g capsule TAKE 2 CAPSULES BY MOUTH TWICE A DAY  . pioglitazone (ACTOS) 30 MG tablet TAKE 1 TABLET BY MOUTH EVERY DAY  . [DISCONTINUED] nitrofurantoin, macrocrystal-monohydrate, (MACROBID) 100 MG capsule Take 1 capsule (100 mg total) by mouth 2 (two) times daily. 1 po BId   No facility-administered encounter medications on file as of 05/19/2018.      Review of Systems  Constitutional: Negative.   HENT: Negative.   Eyes: Negative.   Respiratory: Negative.   Cardiovascular: Negative.   Gastrointestinal: Negative.   Endocrine: Negative.   Genitourinary: Negative.   Musculoskeletal: Negative.   Skin: Negative.   Allergic/Immunologic: Negative.   Neurological: Negative.   Hematological: Negative.   Psychiatric/Behavioral: Negative.        Objective:   Physical Exam  Constitutional: She is oriented to person, place, and time. She appears well-developed and well-nourished. No distress.  The patient is pleasant smiling and alert and recuperating from her right shoulder surgery.  The physical therapy has caused increased aching and soreness throughout her body.  She understands that with time this will get better.  HENT:  Head: Normocephalic and atraumatic.  Right Ear: External ear normal.  Left Ear: External ear normal.  Nose: Nose normal.  Mouth/Throat: Oropharynx is clear and moist. No oropharyngeal exudate.  Eyes: Pupils are equal, round, and reactive to light. Conjunctivae and EOM are normal. Right eye exhibits no discharge. Left eye exhibits no discharge. No scleral icterus.  Neck: Normal range of motion. Neck supple. No thyromegaly present.  Cardiovascular: Normal rate, regular rhythm, normal heart sounds and intact  distal pulses.  No murmur heard. Heart is regular at 60/min  Pulmonary/Chest: Effort normal and breath sounds normal. She has no wheezes. She has no rales.  Clear anteriorly and posteriorly  Abdominal: Soft. Bowel sounds are normal. She exhibits no mass. There is tenderness. There is no guarding.  Slight epigastric tenderness without liver or spleen enlargement.  No bruits and no inguinal adenopathy.  Musculoskeletal: She exhibits edema and tenderness.  Patient is continue to work with physical therapy to improve her range of motion of the right arm and shoulder.  There is still some slight swelling in the shoulder but the incisions have healed well.  The surgery was the end of July and she does not return to see the orthopedist until early October.  Lymphadenopathy:    She has no cervical adenopathy.  Neurological: She is alert and oriented to person, place, and time. She has normal reflexes. No cranial nerve deficit.  Reflexes are equal bilaterally in lower extremities  Skin: Skin is warm and dry. No rash noted.  Psychiatric: She has a normal mood and affect. Her behavior is normal. Judgment and thought content normal.  The patient's mood affect and behavior were all normal.  Nursing note and vitals reviewed.   BP 135/68 (BP Location: Left Arm)   Pulse (!) 50   Temp (!) 96.8 F (36 C) (Oral)   Ht 5' (1.524 m)   Wt 172 lb (78 kg)   BMI 33.59 kg/m        Assessment & Plan:  1. Essential hypertension -The blood pressure is good today she will continue with current treatment  2. Vitamin D deficiency -Continue with vitamin D replacement pending results of lab work  3. Other hyperlipidemia -Hopefully as she is able to improve with her shoulder range of motion following the surgery she will be able to resume more walking and activity to help with therapeutic lifestyle changes  4. Pre-diabetes -The blood sugar was stable and she hopes to do better with diet what she is able to be  more active physically  5. Complete tear of right rotator cuff, unspecified whether traumatic -She is now status post surgery of this in late July and will go back and see the orthopedic surgeon again in early October while continue with her physical therapy at the rehab center [place].  Patient Instructions                       Medicare Annual Wellness Visit  [facility] and the medical providers at [facility] strive to bring you the best medical care.  In doing so we not only want to address your current medical conditions and concerns but also to detect new conditions early and prevent illness, disease and health-related problems.    Medicare offers a yearly Wellness Visit which allows our clinical staff to assess your need for preventative services including immunizations, lifestyle education, counseling to decrease risk of preventable diseases and screening for fall risk and other medical concerns.    This visit is provided free of charge (no copay) for all Medicare recipients. The clinical pharmacists at [facility] have begun to conduct these Wellness Visits which will also include a thorough review of all your medications.    As you primary medical provider recommend that you make an appointment for your Annual Wellness Visit if you have not done so already this year.  You may set up this appointment before you leave today or you may call back ([phone]) and schedule an appointment.  Please make sure when you call that you mention that you are scheduling your Annual Wellness Visit with the clinical pharmacist so that the appointment may be made for the proper length of time.     Continue current medications. Continue good therapeutic lifestyle changes which include good diet and exercise. Fall precautions discussed with patient. If an FOBT was given today- please return it to our front desk. If you are over 74 years old - you may need  Prevnar 13 or the adult Pneumonia vaccine.  **Flu shots are available--- please call and schedule a FLU-CLINIC appointment**  After your visit with [place] today you will receive a survey in the mail or online from [employer] regarding your care with [place]. Please take a moment to fill this out. Your feedback is very important to [place] as you can help [place] better understand your patient needs as well as improve your experience and satisfaction. WE CARE ABOUT YOU!!!   Continue  to walk and exercise regularly Continue to make every effort possible to lose weight through diet and exercise and lower the BMI Follow-up with orthopedics as planned Continue with physical therapy as directed by the orthopedic surgeon Continue to be careful do not put yourself at risk for falling  Arrie Senate MD

## 2018-05-19 NOTE — Patient Instructions (Addendum)
Medicare Annual Wellness Visit  [facility] and the medical providers at [facility] strive to bring you the best medical care.  In doing so we not only want to address your current medical conditions and concerns but also to detect new conditions early and prevent illness, disease and health-related problems.    Medicare offers a yearly Wellness Visit which allows our clinical staff to assess your need for preventative services including immunizations, lifestyle education, counseling to decrease risk of preventable diseases and screening for fall risk and other medical concerns.    This visit is provided free of charge (no copay) for all Medicare recipients. The clinical pharmacists at [facility] have begun to conduct these Wellness Visits which will also include a thorough review of all your medications.    As you primary medical provider recommend that you make an appointment for your Annual Wellness Visit if you have not done so already this year.  You may set up this appointment before you leave today or you may call back ([phone]) and schedule an appointment.  Please make sure when you call that you mention that you are scheduling your Annual Wellness Visit with the clinical pharmacist so that the appointment may be made for the proper length of time.     Continue current medications. Continue good therapeutic lifestyle changes which include good diet and exercise. Fall precautions discussed with patient. If an FOBT was given today- please return it to our front desk. If you are over 79 years old - you may need Prevnar 29 or the adult Pneumonia vaccine.  **Flu shots are available--- please call and schedule a FLU-CLINIC appointment**  After your visit with [place] today you will receive a survey in the mail or online from [employer] regarding your care with [place]. Please take a moment to fill this out. Your feedback is very  important to [place] as you can help [place] better understand your patient needs as well as improve your experience and satisfaction. WE CARE ABOUT YOU!!!   Continue to walk and exercise regularly Continue to make every effort possible to lose weight through diet and exercise and lower the BMI Follow-up with orthopedics as planned Continue with physical therapy as directed by the orthopedic surgeon Continue to be careful do not put yourself at risk for falling

## 2018-05-21 ENCOUNTER — Encounter: Payer: Self-pay | Admitting: Physical Therapy

## 2018-05-21 ENCOUNTER — Ambulatory Visit: Payer: Medicare Other | Admitting: Physical Therapy

## 2018-05-21 DIAGNOSIS — G8929 Other chronic pain: Secondary | ICD-10-CM | POA: Diagnosis not present

## 2018-05-21 DIAGNOSIS — M25511 Pain in right shoulder: Principal | ICD-10-CM

## 2018-05-21 DIAGNOSIS — M25611 Stiffness of right shoulder, not elsewhere classified: Secondary | ICD-10-CM

## 2018-05-21 NOTE — Therapy (Signed)
[facility] [place], [place], [address] Phone: [phone]   Fax:  [phone]  Physical Therapy Treatment  Patient Details  Name: Misty Smith MRN: [id number] Date of Birth: 10-28-1940 Referring Provider: Esmond Plants MD   Encounter Date: 05/21/2018  PT End of Session - 05/21/18 0818    Visit Number  15    Number of Visits  16    Date for PT Re-Evaluation  06/30/18    PT Start Time  0817    PT Stop Time  0907    PT Time Calculation (min)  50 min    Activity Tolerance  Patient tolerated treatment well    Behavior During Therapy  [facility] for tasks assessed/performed       Past Medical History:  Diagnosis Date  . Anxiety   . Arthritis   . Bronchitis   . Cataract   . Colovaginal fistula   . Depression   . Diabetes mellitus without complication ([place])   . Dysrhythmia    palpitations, occasional PVCs; Dr Stanford Breed cardiologist  . Esophageal stricture   . Fractured elbow 1970's  . GERD (gastroesophageal reflux disease)   . H/O hiatal hernia   . Hyperlipidemia   . Hypertension   . Perimenopausal vasomotor symptoms   . Postmenopausal HRT (hormone replacement therapy)   . Pre-diabetes   . Stress   . Subacute lupus erythematosus    Dr. Sol Blazing      Past Surgical History:  Procedure Laterality Date  . abdominal bypass  1987   fibroids   . ABDOMINAL HYSTERECTOMY     partial  . bilateral tubal ligtation  1972  . CHOLECYSTECTOMY N/A 03/21/2014   Procedure: LAPAROSCOPIC CHOLECYSTECTOMY WITH INTRAOPERATIVE CHOLANGIOGRAM;  Surgeon: Shann Medal, MD;  Location: [place];  Service: General;  Laterality: N/A;  . COLONOSCOPY    . elbow Right 1976   surgery after fracture of elbow  . EYE SURGERY Bilateral    cataracts  . ROTATOR CUFF REPAIR Right 03/29/2018  . TONSILLECTOMY    . TUBAL LIGATION      There were no vitals filed for this visit.  Subjective Assessment - 05/21/18 0817    Subjective  Reports that her  shoulder is very sore from doing things around the house such as vacuuming.    Pertinent History  DM; Right elbow fracture.    Patient Stated Goals  Use right UE again.    Currently in Pain?  Yes    Pain Score  2     Pain Location  Shoulder    Pain Orientation  Right    Pain Descriptors / Indicators  Sore    Pain Type  Surgical pain    Pain Onset  More than a month ago         [facility] PT Assessment - 05/21/18 0001      Assessment   Medical Diagnosis  Right RCR.    Onset Date/Surgical Date  03/29/18    Next MD Visit  06/02/2018      Restrictions   Weight Bearing Restrictions  No      ROM / Strength   AROM / PROM / Strength  AROM      AROM   Overall AROM   Deficits    AROM Assessment Site  Shoulder    Right/Left Shoulder  Right    Right Shoulder Flexion  120 Degrees    Right Shoulder External Rotation  55 Degrees  [facility] Adult PT Treatment/Exercise - 05/21/18 0001      Exercises   Exercises  Shoulder      Shoulder Exercises: Supine   Flexion  AAROM;Both;20 reps      Shoulder Exercises: Standing   External Rotation  Strengthening;Right;20 reps;Theraband    Theraband Level (Shoulder External Rotation)  Level 1 (Yellow)    Internal Rotation  Strengthening;Right;20 reps;Theraband    Theraband Level (Shoulder Internal Rotation)  Level 1 (Yellow)    Row  Strengthening;Right;20 reps;Theraband    Theraband Level (Shoulder Row)  Level 1 (Yellow)      Shoulder Exercises: Pulleys   Flexion  5 minutes    Other Pulley Exercises  Wall ladder x5 reps    Other Pulley Exercises  UE ranger into flexion, circles x20 reps      Modalities   Modalities  Electrical Stimulation;Vasopneumatic      Electrical Stimulation   Electrical Stimulation Location  R shoulder    Electrical Stimulation Action  Pre-Mod    Electrical Stimulation Parameters  80-150 hz x15 min    Electrical Stimulation Goals  Pain      Vasopneumatic   Number Minutes Vasopneumatic   15  minutes    Vasopnuematic Location   Shoulder    Vasopneumatic Pressure  Low    Vasopneumatic Temperature   34      Manual Therapy   Manual Therapy  Passive ROM    Passive ROM  PAAROM in all planes with oscillatrions to help improve ROM               PT Short Term Goals - 05/13/18 1610      PT SHORT TERM GOAL #1   Title  STG's=LTG's.        PT Long Term Goals - 05/17/18 9604      PT LONG TERM GOAL #1   Title  Independent with a HEP.    Time  6    Period  Weeks    Status  Achieved      PT LONG TERM GOAL #2   Title  Active right shoulder flexion to 145 degrees so the patient can easily reach overhead.    Time  6    Period  Weeks    Status  On-going   AROM 125 degrees 05/17/18     PT LONG TERM GOAL #3   Title  Active ER to 70 degrees+ to allow for easily donning/doffing of apparel.    Period  Weeks    Status  On-going   AROM 62 degrees 05/17/18     PT LONG TERM GOAL #4   Title  Increase right shoulder strength to a solid 4/5 to increase stability for performance of functional activities.    Time  6    Period  Weeks    Status  On-going      PT LONG TERM GOAL #5   Title  Increase ROM so patient is able to reach behind back to L3.    Time  6    Status  On-going   NT 05/17/18     PT LONG TERM GOAL #6   Title  Perform ADL's with pain not > 3/10.    Time  6    Status  On-going            Plan - 05/21/18 0902    Clinical Impression Statement  Patient tolerated today's treatment well although presenting with increased soreness in R shoulder due to activities such  as vacuuming. Patient able to complete all light strengthening and ROM exercises with good technique and monitored by PTA to avoid any pain or correct techniques. ROM measurements were slightly less today which may be due to soreness of R shoulder. Firm end feels and smooth arc of motion noted with PROM of R shoulder. Normal modalities. Patient educated to be careful with any heavy activities around  her home as to prevent any further injury.    Clinical Impairments Affecting Rehab Potential  surgery 03/29/18 current 7 weeks 05/17/18    PT Frequency  2x / week    PT Duration  6 weeks    PT Treatment/Interventions  ADLs/Self Care Home Management;Cryotherapy;Electrical Stimulation;Ultrasound;Therapeutic exercise;Therapeutic activities;Patient/family education;Passive range of motion;Manual techniques;Vasopneumatic Device;Visual/perceptual remediation/compensation    PT Next Visit Plan  Continue with AAROM and gentle PREs per MD note. (MD 06/02/18)    PT Home Exercise Plan  shoulder isometrics, AAROM shoulder flexion and ER    Consulted and Agree with Plan of Care  Patient       Patient will benefit from skilled therapeutic intervention in order to improve the following deficits and impairments:  Decreased activity tolerance, Decreased range of motion, Pain  Visit Diagnosis: Chronic right shoulder pain  Stiffness of right shoulder, not elsewhere classified     Problem List Patient Active Problem List   Diagnosis Date Noted  . Coronary artery disease due to lipid rich plaque 03/18/2016  . Anxiety state 03/18/2016  . Vitamin D deficiency 03/18/2016  . 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 CAD (coronary artery disease) 12/30/2013  . 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 Osteopenia of the elderly 10/26/2013  . Generalized anxiety disorder 09/21/2013  . GERD (gastroesophageal reflux disease) 09/21/2013  . Pulmonary nodule seen on imaging study 09/21/2013  . SYNCOPE 02/02/2009  . Pre-diabetes 02/01/2009  . Hyperlipidemia 02/01/2009  . Essential hypertension 02/01/2009  . DIZZINESS 02/01/2009  . PALPITATIONS 02/01/2009    Standley Brooking, PTA 05/21/2018, 9:22 AM  [facility] [address] [place], [place], [address] Phone: [phone]   Fax:  [phone]  Name: Misty Smith MRN: [id number] Date of Birth: 06-Feb-1941

## 2018-05-22 ENCOUNTER — Other Ambulatory Visit: Payer: Self-pay | Admitting: Family Medicine

## 2018-05-24 ENCOUNTER — Encounter: Payer: Self-pay | Admitting: Physical Therapy

## 2018-05-24 ENCOUNTER — Ambulatory Visit: Payer: Medicare Other | Admitting: Physical Therapy

## 2018-05-24 DIAGNOSIS — G8929 Other chronic pain: Secondary | ICD-10-CM | POA: Diagnosis not present

## 2018-05-24 DIAGNOSIS — M25511 Pain in right shoulder: Secondary | ICD-10-CM | POA: Diagnosis not present

## 2018-05-24 DIAGNOSIS — M25611 Stiffness of right shoulder, not elsewhere classified: Secondary | ICD-10-CM

## 2018-05-24 NOTE — Therapy (Signed)
[facility] [place], [place], [address] Phone: [phone]   Fax:  [phone]  Physical Therapy Treatment  Patient Details  Name: MCCALL WILL MRN: [id number] Date of Birth: 1941/03/28 Referring Provider: Esmond Plants MD   Encounter Date: 05/24/2018  PT End of Session - 05/24/18 0933    Visit Number  16    Number of Visits  16    Date for PT Re-Evaluation  06/30/18    PT Start Time  0901    PT Stop Time  0947    PT Time Calculation (min)  46 min    Activity Tolerance  Patient tolerated treatment well    Behavior During Therapy  [facility] for tasks assessed/performed       Past Medical History:  Diagnosis Date  . Anxiety   . Arthritis   . Bronchitis   . Cataract   . Colovaginal fistula   . Depression   . Diabetes mellitus without complication ([place])   . Dysrhythmia    palpitations, occasional PVCs; Dr Stanford Breed cardiologist  . Esophageal stricture   . Fractured elbow 1970's  . GERD (gastroesophageal reflux disease)   . H/O hiatal hernia   . Hyperlipidemia   . Hypertension   . Perimenopausal vasomotor symptoms   . Postmenopausal HRT (hormone replacement therapy)   . Pre-diabetes   . Stress   . Subacute lupus erythematosus    Dr. Sol Blazing      Past Surgical History:  Procedure Laterality Date  . abdominal bypass  1987   fibroids   . ABDOMINAL HYSTERECTOMY     partial  . bilateral tubal ligtation  1972  . CHOLECYSTECTOMY N/A 03/21/2014   Procedure: LAPAROSCOPIC CHOLECYSTECTOMY WITH INTRAOPERATIVE CHOLANGIOGRAM;  Surgeon: Shann Medal, MD;  Location: [place];  Service: General;  Laterality: N/A;  . COLONOSCOPY    . elbow Right 1976   surgery after fracture of elbow  . EYE SURGERY Bilateral    cataracts  . ROTATOR CUFF REPAIR Right 03/29/2018  . TONSILLECTOMY    . TUBAL LIGATION      There were no vitals filed for this visit.  Subjective Assessment - 05/24/18 0904    Subjective  Patient did well after  last treatment and minimal soreness today.    Pertinent History  DM; Right elbow fracture.    Patient Stated Goals  Use right UE again.    Currently in Pain?  Yes    Pain Score  1     Pain Location  Shoulder    Pain Orientation  Right    Pain Descriptors / Indicators  Sore    Pain Onset  More than a month ago    Pain Frequency  Intermittent    Aggravating Factors   movement overhead    Pain Relieving Factors  at rest         [facility] PT Assessment - 05/24/18 0001      AROM   AROM Assessment Site  Shoulder    Right/Left Shoulder  Right    Right Shoulder External Rotation  60 Degrees      PROM   PROM Assessment Site  Shoulder    Right/Left Shoulder  Right    Right Shoulder External Rotation  65 Degrees                   [facility] Adult PT Treatment/Exercise - 05/24/18 0001      Shoulder Exercises: Supine   Flexion  AAROM;Both;20 reps  Shoulder Exercises: Standing   External Rotation  Strengthening;Right;20 reps;Theraband    Theraband Level (Shoulder External Rotation)  Level 1 (Yellow)    Internal Rotation  Strengthening;Right;20 reps;Theraband    Theraband Level (Shoulder Internal Rotation)  Level 1 (Yellow)      Shoulder Exercises: Pulleys   Flexion  5 minutes    Other Pulley Exercises  Wall ladder x5 reps    Other Pulley Exercises  UE ranger into flexion, circles x20 reps      Electrical Stimulation   Electrical Stimulation Location  R shoulder    Electrical Stimulation Action  premod    Electrical Stimulation Parameters  80-150hz  x45min    Electrical Stimulation Goals  Pain      Vasopneumatic   Number Minutes Vasopneumatic   15 minutes    Vasopnuematic Location   Shoulder    Vasopneumatic Pressure  Low      Manual Therapy   Manual Therapy  Passive ROM    Passive ROM  PAAROM in all planes with oscillatrions to help improve ROM               PT Short Term Goals - 05/13/18 2703      PT SHORT TERM GOAL #1   Title  STG's=LTG's.         PT Long Term Goals - 05/17/18 5009      PT LONG TERM GOAL #1   Title  Independent with a HEP.    Time  6    Period  Weeks    Status  Achieved      PT LONG TERM GOAL #2   Title  Active right shoulder flexion to 145 degrees so the patient can easily reach overhead.    Time  6    Period  Weeks    Status  On-going   AROM 125 degrees 05/17/18     PT LONG TERM GOAL #3   Title  Active ER to 70 degrees+ to allow for easily donning/doffing of apparel.    Period  Weeks    Status  On-going   AROM 62 degrees 05/17/18     PT LONG TERM GOAL #4   Title  Increase right shoulder strength to a solid 4/5 to increase stability for performance of functional activities.    Time  6    Period  Weeks    Status  On-going      PT LONG TERM GOAL #5   Title  Increase ROM so patient is able to reach behind back to L3.    Time  6    Status  On-going   NT 05/17/18     PT LONG TERM GOAL #6   Title  Perform ADL's with pain not > 3/10.    Time  6    Status  On-going            Plan - 05/24/18 0934    Clinical Impression Statement  Patient tolerated treatment well today. Pattient has minimal soreness overall yet with overhead soreness increases. Patient progressing with all activities and improving ROM in right shoulder. Patient doing HEP daily per reported. Goals progressing at this time. MD F/U next week.     Clinical Impairments Affecting Rehab Potential  surgery 03/29/18 current 8 weeks 05/24/18    PT Frequency  2x / week    PT Duration  6 weeks    PT Treatment/Interventions  ADLs/Self Care Home Management;Cryotherapy;Electrical Stimulation;Ultrasound;Therapeutic exercise;Therapeutic activities;Patient/family education;Passive range of motion;Manual techniques;Vasopneumatic Device;Visual/perceptual remediation/compensation  PT Next Visit Plan  Continue with AAROM and gentle PREs per MD note. (MD 06/02/18)    Consulted and Agree with Plan of Care  Patient       Patient will benefit from  skilled therapeutic intervention in order to improve the following deficits and impairments:  Decreased activity tolerance, Decreased range of motion, Pain  Visit Diagnosis: Chronic right shoulder pain  Stiffness of right shoulder, not elsewhere classified     Problem List Patient Active Problem List   Diagnosis Date Noted  . Coronary artery disease due to lipid rich plaque 03/18/2016  . Anxiety state 03/18/2016  . Vitamin D deficiency 03/18/2016  . 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 CAD (coronary artery disease) 12/30/2013  . 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 Osteopenia of the elderly 10/26/2013  . Generalized anxiety disorder 09/21/2013  . GERD (gastroesophageal reflux disease) 09/21/2013  . Pulmonary nodule seen on imaging study 09/21/2013  . SYNCOPE 02/02/2009  . Pre-diabetes 02/01/2009  . Hyperlipidemia 02/01/2009  . Essential hypertension 02/01/2009  . DIZZINESS 02/01/2009  . PALPITATIONS 02/01/2009    Phillips Climes, PTA 05/24/2018, 9:49 AM  [facility] [place], [place], [address] Phone: [phone]   Fax:  [phone]  Name: ILYSE TREMAIN MRN: [id number] Date of Birth: [birth year]

## 2018-05-26 DIAGNOSIS — H35352 Cystoid macular degeneration, left eye: Secondary | ICD-10-CM | POA: Diagnosis not present

## 2018-05-26 DIAGNOSIS — Z961 Presence of intraocular lens: Secondary | ICD-10-CM | POA: Diagnosis not present

## 2018-05-27 ENCOUNTER — Encounter: Payer: Self-pay | Admitting: Physical Therapy

## 2018-05-27 ENCOUNTER — Ambulatory Visit: Payer: Medicare Other | Admitting: Physical Therapy

## 2018-05-27 DIAGNOSIS — G8929 Other chronic pain: Secondary | ICD-10-CM | POA: Diagnosis not present

## 2018-05-27 DIAGNOSIS — M25611 Stiffness of right shoulder, not elsewhere classified: Secondary | ICD-10-CM

## 2018-05-27 DIAGNOSIS — M25511 Pain in right shoulder: Principal | ICD-10-CM

## 2018-05-27 NOTE — Therapy (Signed)
[facility] [place], [place], [address] Phone: [phone]   Fax:  [phone]  Physical Therapy Treatment  Patient Details  Name: Misty Smith MRN: [id number] Date of Birth: 1940-10-20 Referring Provider: Esmond Plants MD   Encounter Date: 05/27/2018  PT End of Session - 05/27/18 0851    Visit Number  17    Number of Visits  16    Date for PT Re-Evaluation  06/30/18    PT Start Time  0816    PT Stop Time  0913    PT Time Calculation (min)  57 min    Activity Tolerance  Patient tolerated treatment well    Behavior During Therapy  [facility] for tasks assessed/performed       Past Medical History:  Diagnosis Date  . Anxiety   . Arthritis   . Bronchitis   . Cataract   . Colovaginal fistula   . Depression   . Diabetes mellitus without complication ([place])   . Dysrhythmia    palpitations, occasional PVCs; Dr Stanford Breed cardiologist  . Esophageal stricture   . Fractured elbow 1970's  . GERD (gastroesophageal reflux disease)   . H/O hiatal hernia   . Hyperlipidemia   . Hypertension   . Perimenopausal vasomotor symptoms   . Postmenopausal HRT (hormone replacement therapy)   . Pre-diabetes   . Stress   . Subacute lupus erythematosus    Dr. Sol Blazing      Past Surgical History:  Procedure Laterality Date  . abdominal bypass  1987   fibroids   . ABDOMINAL HYSTERECTOMY     partial  . bilateral tubal ligtation  1972  . CHOLECYSTECTOMY N/A 03/21/2014   Procedure: LAPAROSCOPIC CHOLECYSTECTOMY WITH INTRAOPERATIVE CHOLANGIOGRAM;  Surgeon: Shann Medal, MD;  Location: [place];  Service: General;  Laterality: N/A;  . COLONOSCOPY    . elbow Right 1976   surgery after fracture of elbow  . EYE SURGERY Bilateral    cataracts  . ROTATOR CUFF REPAIR Right 03/29/2018  . TONSILLECTOMY    . TUBAL LIGATION      There were no vitals filed for this visit.  Subjective Assessment - 05/27/18 0833    Subjective  Patient arrived with  no new complaints, doind well today with minimal soreness    Patient is accompained by:  Family member    Pertinent History  DM; Right elbow fracture.    Patient Stated Goals  Use right UE again.    Currently in Pain?  Yes    Pain Score  1     Pain Location  Shoulder    Pain Orientation  Right    Pain Descriptors / Indicators  Discomfort    Pain Type  Surgical pain    Pain Onset  More than a month ago    Aggravating Factors   overhead movement    Pain Relieving Factors  at rest                       [facility] Adult PT Treatment/Exercise - 05/27/18 0001      Exercises   Exercises  Shoulder      Shoulder Exercises: Supine   Flexion  AAROM;Both;20 reps      Shoulder Exercises: Prone   Retraction  AROM;Right;20 reps    Extension  AROM;Right;20 reps      Shoulder Exercises: Standing   External Rotation  Strengthening;Right;20 reps;Theraband    Theraband Level (Shoulder External Rotation)  Level 1 (  Yellow)    Internal Rotation  Strengthening;Right;20 reps;Theraband    Theraband Level (Shoulder Internal Rotation)  Level 1 (Yellow)    Other Standing Exercises  wall wash 2x15      Shoulder Exercises: Pulleys   Flexion  5 minutes    Other Pulley Exercises  Wall ladder x5 reps    Other Pulley Exercises  UE ranger into flexion, circles x20 reps      Electrical Stimulation   Electrical Stimulation Location  R shoulder    Electrical Stimulation Action  premod    Electrical Stimulation Parameters  80-150hz  x11min    Electrical Stimulation Goals  Pain      Vasopneumatic   Number Minutes Vasopneumatic   15 minutes    Vasopnuematic Location   Shoulder    Vasopneumatic Pressure  Low      Manual Therapy   Manual Therapy  Passive ROM    Manual therapy comments  rhythmic stabs for ER/IR in scaption, then flex/ext at 90 egrees    Passive ROM  PAAROM in all planes with oscillatrions to help improve ROM               PT Short Term Goals - 05/13/18 6812      PT  SHORT TERM GOAL #1   Title  STG's=LTG's.        PT Long Term Goals - 05/17/18 7517      PT LONG TERM GOAL #1   Title  Independent with a HEP.    Time  6    Period  Weeks    Status  Achieved      PT LONG TERM GOAL #2   Title  Active right shoulder flexion to 145 degrees so the patient can easily reach overhead.    Time  6    Period  Weeks    Status  On-going   AROM 125 degrees 05/17/18     PT LONG TERM GOAL #3   Title  Active ER to 70 degrees+ to allow for easily donning/doffing of apparel.    Period  Weeks    Status  On-going   AROM 62 degrees 05/17/18     PT LONG TERM GOAL #4   Title  Increase right shoulder strength to a solid 4/5 to increase stability for performance of functional activities.    Time  6    Period  Weeks    Status  On-going      PT LONG TERM GOAL #5   Title  Increase ROM so patient is able to reach behind back to L3.    Time  6    Status  On-going   NT 05/17/18     PT LONG TERM GOAL #6   Title  Perform ADL's with pain not > 3/10.    Time  6    Status  On-going            Plan - 05/27/18 0857    Clinical Impression Statement  Patient tolerated treatment well. Patient able to perform all exercises with ease. Patient has minimal discomfort overall. Patient continues to improve with ROM in right shoulder. Patient goals progressing and is going to MD next week for F/U.Marland Kitchen    Clinical Impairments Affecting Rehab Potential  surgery 03/29/18 current 8 weeks 05/24/18    PT Frequency  2x / week    PT Duration  6 weeks    PT Treatment/Interventions  ADLs/Self Care Home Management;Cryotherapy;Electrical Stimulation;Ultrasound;Therapeutic exercise;Therapeutic activities;Patient/family education;Passive range of motion;Manual  techniques;Vasopneumatic Device;Visual/perceptual remediation/compensation    PT Next Visit Plan  Continue with AAROM and gentle PREs per MD note. (MD 06/02/18) note next visit    Consulted and Agree with Plan of Care  Patient        Patient will benefit from skilled therapeutic intervention in order to improve the following deficits and impairments:  Decreased activity tolerance, Decreased range of motion, Pain  Visit Diagnosis: Chronic right shoulder pain  Stiffness of right shoulder, not elsewhere classified     Problem List Patient Active Problem List   Diagnosis Date Noted  . Coronary artery disease due to lipid rich plaque 03/18/2016  . Anxiety state 03/18/2016  . Vitamin D deficiency 03/18/2016  . 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 CAD (coronary artery disease) 12/30/2013  . 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 Osteopenia of the elderly 10/26/2013  . Generalized anxiety disorder 09/21/2013  . GERD (gastroesophageal reflux disease) 09/21/2013  . Pulmonary nodule seen on imaging study 09/21/2013  . SYNCOPE 02/02/2009  . Pre-diabetes 02/01/2009  . Hyperlipidemia 02/01/2009  . Essential hypertension 02/01/2009  . DIZZINESS 02/01/2009  . PALPITATIONS 02/01/2009    Phillips Climes, PTA 05/27/2018, 9:16 AM  [facility] [place], [place], [address] Phone: [phone]   Fax:  [phone]  Name: Misty Smith MRN: [id number] Date of Birth: [birth year]

## 2018-05-31 ENCOUNTER — Ambulatory Visit: Payer: Medicare Other | Admitting: Physical Therapy

## 2018-05-31 ENCOUNTER — Encounter: Payer: Self-pay | Admitting: Physical Therapy

## 2018-05-31 DIAGNOSIS — G8929 Other chronic pain: Secondary | ICD-10-CM | POA: Diagnosis not present

## 2018-05-31 DIAGNOSIS — M25511 Pain in right shoulder: Principal | ICD-10-CM

## 2018-05-31 DIAGNOSIS — M25611 Stiffness of right shoulder, not elsewhere classified: Secondary | ICD-10-CM

## 2018-05-31 NOTE — Therapy (Signed)
[facility] [place], [place], [address] Phone: [phone]   Fax:  [phone]  Physical Therapy Treatment  Patient Details  Name: Misty Smith MRN: [id number] Date of Birth: [birth year] Referring Provider (PT): Esmond Plants MD   Encounter Date: 05/31/2018  PT End of Session - 05/31/18 0931    Visit Number  18    Number of Visits  16    Date for PT Re-Evaluation  06/30/18    PT Start Time  0859    PT Stop Time  0947    PT Time Calculation (min)  48 min    Activity Tolerance  Patient tolerated treatment well    Behavior During Therapy  [facility] for tasks assessed/performed       Past Medical History:  Diagnosis Date  . Anxiety   . Arthritis   . Bronchitis   . Cataract   . Colovaginal fistula   . Depression   . Diabetes mellitus without complication ([place])   . Dysrhythmia    palpitations, occasional PVCs; Dr Stanford Breed cardiologist  . Esophageal stricture   . Fractured elbow 1970's  . GERD (gastroesophageal reflux disease)   . H/O hiatal hernia   . Hyperlipidemia   . Hypertension   . Perimenopausal vasomotor symptoms   . Postmenopausal HRT (hormone replacement therapy)   . Pre-diabetes   . Stress   . Subacute lupus erythematosus    Dr. Sol Blazing      Past Surgical History:  Procedure Laterality Date  . abdominal bypass  1987   fibroids   . ABDOMINAL HYSTERECTOMY     partial  . bilateral tubal ligtation  1972  . CHOLECYSTECTOMY N/A 03/21/2014   Procedure: LAPAROSCOPIC CHOLECYSTECTOMY WITH INTRAOPERATIVE CHOLANGIOGRAM;  Surgeon: Shann Medal, MD;  Location: [place];  Service: General;  Laterality: N/A;  . COLONOSCOPY    . elbow Right 1976   surgery after fracture of elbow  . EYE SURGERY Bilateral    cataracts  . ROTATOR CUFF REPAIR Right 03/29/2018  . TONSILLECTOMY    . TUBAL LIGATION      There were no vitals filed for this visit.  Subjective Assessment - 05/31/18 0900    Subjective  Patient arrived  and reported some sorenss yet doing well    Pertinent History  DM; Right elbow fracture.    Patient Stated Goals  Use right UE again.    Currently in Pain?  Yes    Pain Score  1     Pain Location  Shoulder    Pain Orientation  Right    Pain Descriptors / Indicators  Discomfort    Pain Type  Surgical pain    Pain Onset  More than a month ago    Pain Frequency  Intermittent    Aggravating Factors   overhead movement    Pain Relieving Factors  at rest         [facility] PT Assessment - 05/31/18 0001      AROM   AROM Assessment Site  Shoulder    Right/Left Shoulder  Right    Right Shoulder External Rotation  60 Degrees      PROM   PROM Assessment Site  Shoulder    Right/Left Shoulder  Right    Right Shoulder Flexion  145 Degrees    Right Shoulder External Rotation  67 Degrees                   [facility] Adult PT Treatment/Exercise -  05/31/18 0001      Shoulder Exercises: Prone   Retraction  AROM;Right;20 reps    Extension  AROM;Right;20 reps      Shoulder Exercises: Standing   External Rotation  Strengthening;Right;20 reps;Theraband    Theraband Level (Shoulder External Rotation)  Level 1 (Yellow)    Internal Rotation  Strengthening;Right;20 reps;Theraband    Theraband Level (Shoulder Internal Rotation)  Level 1 (Yellow)    Other Standing Exercises  wall wash 2x15      Shoulder Exercises: Pulleys   Flexion  5 minutes    Other Pulley Exercises  Wall ladder x5 reps    Other Pulley Exercises  UE ranger into flexion, circles x20 reps      Electrical Stimulation   Electrical Stimulation Location  R shoulder    Electrical Stimulation Action  premod    Electrical Stimulation Parameters  80-150hz  x64min    Electrical Stimulation Goals  Pain      Vasopneumatic   Number Minutes Vasopneumatic   15 minutes    Vasopnuematic Location   Shoulder    Vasopneumatic Pressure  Low      Manual Therapy   Manual Therapy  Passive ROM    Passive ROM  PAAROM in all planes with  oscillatrions to help improve ROM               PT Short Term Goals - 05/13/18 5631      PT SHORT TERM GOAL #1   Title  STG's=LTG's.        PT Long Term Goals - 05/31/18 0910      PT LONG TERM GOAL #1   Title  Independent with a HEP.    Time  6    Period  Weeks    Status  Achieved      PT LONG TERM GOAL #2   Title  Active right shoulder flexion to 145 degrees so the patient can easily reach overhead.    Time  6    Period  Weeks    Status  On-going      PT LONG TERM GOAL #3   Title  Active ER to 70 degrees+ to allow for easily donning/doffing of apparel.    Time  6    Period  Weeks    Status  On-going   AROM 60 degees 05/31/18     PT LONG TERM GOAL #4   Title  Increase right shoulder strength to a solid 4/5 to increase stability for performance of functional activities.    Time  6    Period  Weeks    Status  On-going   NT 05/31/18     PT LONG TERM GOAL #5   Title  Increase ROM so patient is able to reach behind back to L3.    Time  6    Period  Weeks    Status  On-going   unable to perform at this time 05/31/18     PT LONG TERM GOAL #6   Title  Perform ADL's with pain not > 3/10.    Time  6    Period  Weeks    Status  On-going   unable to perform all ADL's at this time 05/31/18           Plan - 05/31/18 0932    Clinical Impression Statement  Patient tolerated treatment well today. Patient progressing with active assistive exercises and slow progression with PRE's per MD order. Patient has continued to improve with  right shoulder ROM for flexion and ER. Patient going to MD for F/U on wednesday and will prgress per discression. Goals progressing yet ongoing.     Clinical Impairments Affecting Rehab Potential  surgery 03/29/18 current 9 weeks 05/31/18    PT Frequency  2x / week    PT Duration  6 weeks    PT Treatment/Interventions  ADLs/Self Care Home Management;Cryotherapy;Electrical Stimulation;Ultrasound;Therapeutic exercise;Therapeutic  activities;Patient/family education;Passive range of motion;Manual techniques;Vasopneumatic Device;Visual/perceptual remediation/compensation    PT Next Visit Plan  Continue with POC per MD (MD 06/02/18)     Consulted and Agree with Plan of Care  Patient       Patient will benefit from skilled therapeutic intervention in order to improve the following deficits and impairments:  Decreased activity tolerance, Decreased range of motion, Pain  Visit Diagnosis: Chronic right shoulder pain  Stiffness of right shoulder, not elsewhere classified     Problem List Patient Active Problem List   Diagnosis Date Noted  . Coronary artery disease due to lipid rich plaque 03/18/2016  . Anxiety state 03/18/2016  . Vitamin D deficiency 03/18/2016  . 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 CAD (coronary artery disease) 12/30/2013  . 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 Osteopenia of the elderly 10/26/2013  . Generalized anxiety disorder 09/21/2013  . GERD (gastroesophageal reflux disease) 09/21/2013  . Pulmonary nodule seen on imaging study 09/21/2013  . SYNCOPE 02/02/2009  . Pre-diabetes 02/01/2009  . Hyperlipidemia 02/01/2009  . Essential hypertension 02/01/2009  . DIZZINESS 02/01/2009  . PALPITATIONS 02/01/2009    Ladean Raya, PTA 05/31/18 9:51 AM  . [facility] [address] [place], [place], [address] Phone: [phone]   Fax:  [phone]  Name: Misty Smith MRN: [id number] Date of Birth: 09-Apr-1941

## 2018-06-03 ENCOUNTER — Ambulatory Visit: Payer: Medicare Other | Attending: Orthopedic Surgery | Admitting: *Deleted

## 2018-06-03 DIAGNOSIS — G8929 Other chronic pain: Secondary | ICD-10-CM | POA: Diagnosis not present

## 2018-06-03 DIAGNOSIS — M25611 Stiffness of right shoulder, not elsewhere classified: Secondary | ICD-10-CM | POA: Diagnosis not present

## 2018-06-03 DIAGNOSIS — M25511 Pain in right shoulder: Secondary | ICD-10-CM | POA: Diagnosis not present

## 2018-06-03 NOTE — Therapy (Signed)
[facility] [place], [place], [address] Phone: [phone]   Fax:  [phone]  Physical Therapy Treatment  Patient Details  Name: Misty Smith MRN: [id number] Date of Birth: [birth year] Referring Provider (PT): Esmond Plants MD   Encounter Date: 06/03/2018  PT End of Session - 06/03/18 0906    Visit Number  19    Date for PT Re-Evaluation  06/30/18    Authorization Type  MD F/U in 6 weeks from 06-02-18    PT Start Time  0900    PT Stop Time  0955    PT Time Calculation (min)  55 min    Activity Tolerance  Patient tolerated treatment well    Behavior During Therapy  [facility] for tasks assessed/performed       Past Medical History:  Diagnosis Date  . Anxiety   . Arthritis   . Bronchitis   . Cataract   . Colovaginal fistula   . Depression   . Diabetes mellitus without complication ([place])   . Dysrhythmia    palpitations, occasional PVCs; Dr Stanford Breed cardiologist  . Esophageal stricture   . Fractured elbow 1970's  . GERD (gastroesophageal reflux disease)   . H/O hiatal hernia   . Hyperlipidemia   . Hypertension   . Perimenopausal vasomotor symptoms   . Postmenopausal HRT (hormone replacement therapy)   . Pre-diabetes   . Stress   . Subacute lupus erythematosus    Dr. Sol Blazing      Past Surgical History:  Procedure Laterality Date  . abdominal bypass  1987   fibroids   . ABDOMINAL HYSTERECTOMY     partial  . bilateral tubal ligtation  1972  . CHOLECYSTECTOMY N/A 03/21/2014   Procedure: LAPAROSCOPIC CHOLECYSTECTOMY WITH INTRAOPERATIVE CHOLANGIOGRAM;  Surgeon: Shann Medal, MD;  Location: [place];  Service: General;  Laterality: N/A;  . COLONOSCOPY    . elbow Right 1976   surgery after fracture of elbow  . EYE SURGERY Bilateral    cataracts  . ROTATOR CUFF REPAIR Right 03/29/2018  . TONSILLECTOMY    . TUBAL LIGATION      There were no vitals filed for this visit.  Subjective Assessment - 06/03/18 0903     Subjective  Went to f/u with MD yesterday and said I was doing good. He said I can come 1-2x a week. I will see him in 6 weeks.    Patient is accompained by:  Family member    Pertinent History  DM; Right elbow fracture.    Patient Stated Goals  Use right UE again.    Currently in Pain?  Yes    Pain Score  2     Pain Location  Shoulder    Pain Orientation  Right    Pain Descriptors / Indicators  Sore    Pain Type  Surgical pain    Pain Onset  More than a month ago    Pain Frequency  Intermittent                       [facility] Adult PT Treatment/Exercise - 06/03/18 0001      Exercises   Exercises  Shoulder      Shoulder Exercises: Prone   Retraction  AROM;Right;20 reps    Extension  AROM;Right;20 reps      Shoulder Exercises: Standing   External Rotation  Strengthening;Right;20 reps;Theraband   2x fatigue   Theraband Level (Shoulder External Rotation)  Level 1 (Yellow)  Internal Rotation  Strengthening;Right;20 reps;Theraband   2xfatigue   Theraband Level (Shoulder Internal Rotation)  Level 1 (Yellow)    Other Standing Exercises  wall wash 2x10 CW / CCW      Shoulder Exercises: Pulleys   Flexion  5 minutes    Other Pulley Exercises  --    Other Pulley Exercises  UE ranger into flexion, circles x20 reps      Electrical Stimulation   Electrical Stimulation Location  R shoulder Premod x 15 mins 80-150hz      Electrical Stimulation Goals  Pain      Vasopneumatic   Number Minutes Vasopneumatic   15 minutes    Vasopnuematic Location   Shoulder    Vasopneumatic Pressure  Low    Vasopneumatic Temperature   34      Manual Therapy   Passive ROM  PAAROM in sitting for flexion 3x10 to 120 degrees today               PT Short Term Goals - 05/13/18 5852      PT SHORT TERM GOAL #1   Title  STG's=LTG's.        PT Long Term Goals - 05/31/18 0910      PT LONG TERM GOAL #1   Title  Independent with a HEP.    Time  6    Period  Weeks    Status   Achieved      PT LONG TERM GOAL #2   Title  Active right shoulder flexion to 145 degrees so the patient can easily reach overhead.    Time  6    Period  Weeks    Status  On-going      PT LONG TERM GOAL #3   Title  Active ER to 70 degrees+ to allow for easily donning/doffing of apparel.    Time  6    Period  Weeks    Status  On-going   AROM 60 degees 05/31/18     PT LONG TERM GOAL #4   Title  Increase right shoulder strength to a solid 4/5 to increase stability for performance of functional activities.    Time  6    Period  Weeks    Status  On-going   NT 05/31/18     PT LONG TERM GOAL #5   Title  Increase ROM so patient is able to reach behind back to L3.    Time  6    Period  Weeks    Status  On-going   unable to perform at this time 05/31/18     PT LONG TERM GOAL #6   Title  Perform ADL's with pain not > 3/10.    Time  6    Period  Weeks    Status  On-going   unable to perform all ADL's at this time 05/31/18           Plan - 06/03/18 0955    Clinical Presentation  Stable    Clinical Impairments Affecting Rehab Potential  surgery 03/29/18 current 9 weeks 05/31/18    PT Frequency  2x / week    PT Duration  6 weeks    PT Treatment/Interventions  ADLs/Self Care Home Management;Cryotherapy;Electrical Stimulation;Ultrasound;Therapeutic exercise;Therapeutic activities;Patient/family education;Passive range of motion;Manual techniques;Vasopneumatic Device;Visual/perceptual remediation/compensation    PT Next Visit Plan  Recert to cont PT as per MD    PT Home Exercise Plan  shoulder isometrics, AAROM shoulder flexion and ER    Consulted and  Agree with Plan of Care  Patient       Patient will benefit from skilled therapeutic intervention in order to improve the following deficits and impairments:  Decreased activity tolerance, Decreased range of motion, Pain  Visit Diagnosis: Chronic right shoulder pain  Stiffness of right shoulder, not elsewhere  classified     Problem List Patient Active Problem List   Diagnosis Date Noted  . Coronary artery disease due to lipid rich plaque 03/18/2016  . Anxiety state 03/18/2016  . Vitamin D deficiency 03/18/2016  . 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 CAD (coronary artery disease) 12/30/2013  . 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 Osteopenia of the elderly 10/26/2013  . Generalized anxiety disorder 09/21/2013  . GERD (gastroesophageal reflux disease) 09/21/2013  . Pulmonary nodule seen on imaging study 09/21/2013  . SYNCOPE 02/02/2009  . Pre-diabetes 02/01/2009  . Hyperlipidemia 02/01/2009  . Essential hypertension 02/01/2009  . DIZZINESS 02/01/2009  . PALPITATIONS 02/01/2009    Heberto Sturdevant,CHRIS, PTA 06/03/2018, 3:02 PM  [facility] [place], [place], [address] Phone: [phone]   Fax:  [phone]  Name: Misty Smith MRN: [id number] Date of Birth: 1941-07-14

## 2018-06-07 ENCOUNTER — Ambulatory Visit: Payer: Medicare Other | Admitting: Physical Therapy

## 2018-06-07 DIAGNOSIS — G8929 Other chronic pain: Secondary | ICD-10-CM

## 2018-06-07 DIAGNOSIS — M25611 Stiffness of right shoulder, not elsewhere classified: Secondary | ICD-10-CM

## 2018-06-07 DIAGNOSIS — M25511 Pain in right shoulder: Secondary | ICD-10-CM | POA: Diagnosis not present

## 2018-06-07 NOTE — Therapy (Addendum)
[facility] [place], [place], [address] Phone: [phone]   Fax:  [phone]  Physical Therapy Treatment  Patient Details  Name: GRANT HENKES MRN: [id number] Date of Birth: 10/23/1940 Referring Provider (PT): Esmond Plants MD   Encounter Date: 06/07/2018  PT End of Session - 06/07/18 0856    Visit Number  20    Number of Visits  22    Date for PT Re-Evaluation  06/30/18    PT Start Time  0816    PT Stop Time  0912    PT Time Calculation (min)  56 min    Activity Tolerance  Patient tolerated treatment well    Behavior During Therapy  [facility] for tasks assessed/performed       Past Medical History:  Diagnosis Date  . Anxiety   . Arthritis   . Bronchitis   . Cataract   . Colovaginal fistula   . Depression   . Diabetes mellitus without complication ([place])   . Dysrhythmia    palpitations, occasional PVCs; Dr Stanford Breed cardiologist  . Esophageal stricture   . Fractured elbow 1970's  . GERD (gastroesophageal reflux disease)   . H/O hiatal hernia   . Hyperlipidemia   . Hypertension   . Perimenopausal vasomotor symptoms   . Postmenopausal HRT (hormone replacement therapy)   . Pre-diabetes   . Stress   . Subacute lupus erythematosus    Dr. Sol Blazing      Past Surgical History:  Procedure Laterality Date  . abdominal bypass  1987   fibroids   . ABDOMINAL HYSTERECTOMY     partial  . bilateral tubal ligtation  1972  . CHOLECYSTECTOMY N/A 03/21/2014   Procedure: LAPAROSCOPIC CHOLECYSTECTOMY WITH INTRAOPERATIVE CHOLANGIOGRAM;  Surgeon: Shann Medal, MD;  Location: [place];  Service: General;  Laterality: N/A;  . COLONOSCOPY    . elbow Right 1976   surgery after fracture of elbow  . EYE SURGERY Bilateral    cataracts  . ROTATOR CUFF REPAIR Right 03/29/2018  . TONSILLECTOMY    . TUBAL LIGATION      There were no vitals filed for this visit.  Subjective Assessment - 06/07/18 0821    Subjective  Patient arrived  today with no new complaints, some back discomfort from increased actvity over weekend    Pertinent History  DM; Right elbow fracture.    Patient Stated Goals  Use right UE again.    Currently in Pain?  Yes    Pain Score  2     Pain Location  Shoulder    Pain Orientation  Right    Pain Descriptors / Indicators  Sore    Pain Type  Surgical pain    Pain Onset  More than a month ago    Pain Frequency  Intermittent    Aggravating Factors   any overhead activity    Pain Relieving Factors  with shoulder at rest                       [facility] Adult PT Treatment/Exercise - 06/07/18 0001      Exercises   Exercises  Shoulder      Shoulder Exercises: Seated   Diagonals  Strengthening;Both   2x10   Diagonals Limitations  with green bal    Other Seated Exercises  wall slide with eccentric lowering x fatigue      Shoulder Exercises: Standing   Protraction  Strengthening;Right;20 reps;Theraband    Theraband Level (  Shoulder Protraction)  Level 1 (Yellow)    External Rotation  Strengthening;Right;20 reps;Theraband    Theraband Level (Shoulder External Rotation)  Level 1 (Yellow)    Internal Rotation  Strengthening;Right;20 reps;Theraband    Theraband Level (Shoulder Internal Rotation)  Level 1 (Yellow)    Flexion  Strengthening;AROM;20 reps    Flexion Limitations  no weight    Row  Strengthening;Right;20 reps;Theraband    Theraband Level (Shoulder Row)  Level 1 (Yellow)    Retraction  Strengthening;Both;20 reps;Theraband    Theraband Level (Shoulder Retraction)  Level 2 (Red)      Shoulder Exercises: Pulleys   Flexion  5 minutes      Electrical Stimulation   Electrical Stimulation Location  R shoulder Premod x 15 mins 80-150hz      Electrical Stimulation Goals  Pain      Vasopneumatic   Number Minutes Vasopneumatic   15 minutes    Vasopnuematic Location   Shoulder    Vasopneumatic Pressure  Low      Manual Therapy   Manual Therapy  Passive ROM    Passive ROM  manual  PAAROM in siting for right shoulder flexion and ER with gentle holds                PT Short Term Goals - 05/13/18 1696      PT SHORT TERM GOAL #1   Title  STG's=LTG's.        PT Long Term Goals - 06/07/18 7893      PT LONG TERM GOAL #1   Title  Independent with a HEP.    Time  6    Period  Weeks    Status  Achieved      PT LONG TERM GOAL #2   Title  Active right shoulder flexion to 145 degrees so the patient can easily reach overhead.    Time  6    Period  Weeks    Status  On-going   AROM 95 degrres 06/07/18     PT LONG TERM GOAL #3   Title  Active ER to 70 degrees+ to allow for easily donning/doffing of apparel.    Time  6    Period  Weeks    Status  On-going   AROM 60 degrees 06/07/18     PT LONG TERM GOAL #4   Title  Increase right shoulder strength to a solid 4/5 to increase stability for performance of functional activities.    Time  6    Period  Weeks    Status  On-going   NT 06/07/18     PT LONG TERM GOAL #5   Title  Increase ROM so patient is able to reach behind back to L3.    Time  6    Period  Weeks    Status  On-going      PT LONG TERM GOAL #6   Title  Perform ADL's with pain not > 3/10.    Time  6    Period  Weeks    Status  On-going   not performing all ADL's at this time 06/07/18           Plan - 06/07/18 0858    Clinical Impression Statement  Patient tolerated treatment well today. Patient able to progress with right shoulder strengtheing exercises today. Patient has reported some low back discomfort so today modified exericises in seated position to provide comfort for patient. Patient able to reach up yet only to 95 degrees  actively due to weakness. Goals ongoing at this time.     Clinical Impairments Affecting Rehab Potential  surgery 03/29/18 current 10 weeks 06/07/18    PT Frequency  2x / week    PT Duration  6 weeks    PT Treatment/Interventions  ADLs/Self Care Home Management;Cryotherapy;Electrical  Stimulation;Ultrasound;Therapeutic exercise;Therapeutic activities;Patient/family education;Passive range of motion;Manual techniques;Vasopneumatic Device;Visual/perceptual remediation/compensation    PT Next Visit Plan  cont with POC per PT and  MD POC per [facility] protocol    Consulted and Agree with Plan of Care  Patient       Patient will benefit from skilled therapeutic intervention in order to improve the following deficits and impairments:  Decreased activity tolerance, Decreased range of motion, Pain  Visit Diagnosis: Chronic right shoulder pain  Stiffness of right shoulder, not elsewhere classified     Problem List Patient Active Problem List   Diagnosis Date Noted  . Coronary artery disease due to lipid rich plaque 03/18/2016  . Anxiety state 03/18/2016  . Vitamin D deficiency 03/18/2016  . 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 CAD (coronary artery disease) 12/30/2013  . 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 Osteopenia of the elderly 10/26/2013  . Generalized anxiety disorder 09/21/2013  . GERD (gastroesophageal reflux disease) 09/21/2013  . Pulmonary nodule seen on imaging study 09/21/2013  . SYNCOPE 02/02/2009  . Pre-diabetes 02/01/2009  . Hyperlipidemia 02/01/2009  . Essential hypertension 02/01/2009  . DIZZINESS 02/01/2009  . PALPITATIONS 02/01/2009    Phillips Climes, PTA 06/07/2018, 9:13 AM  [facility] [place], [place], [address] Phone: [phone]   Fax:  [phone]  Name: MARESA MORASH MRN: [id number] Date of Birth: May 10, 1941  Progress Note Reporting Period 04/01/18 to 06/03/18  See note below for Objective Data and Assessment of Progress/Goals. Patient is making excellent progress with progression toward goals (please see above) per protocol.    Mali Applegate MPT

## 2018-06-09 DIAGNOSIS — M9904 Segmental and somatic dysfunction of sacral region: Secondary | ICD-10-CM | POA: Diagnosis not present

## 2018-06-09 DIAGNOSIS — M9903 Segmental and somatic dysfunction of lumbar region: Secondary | ICD-10-CM | POA: Diagnosis not present

## 2018-06-09 DIAGNOSIS — M9901 Segmental and somatic dysfunction of cervical region: Secondary | ICD-10-CM | POA: Diagnosis not present

## 2018-06-09 DIAGNOSIS — M62830 Muscle spasm of back: Secondary | ICD-10-CM | POA: Diagnosis not present

## 2018-06-10 ENCOUNTER — Ambulatory Visit: Payer: Medicare Other | Admitting: *Deleted

## 2018-06-10 DIAGNOSIS — M25611 Stiffness of right shoulder, not elsewhere classified: Secondary | ICD-10-CM

## 2018-06-10 DIAGNOSIS — M25511 Pain in right shoulder: Principal | ICD-10-CM

## 2018-06-10 DIAGNOSIS — G8929 Other chronic pain: Secondary | ICD-10-CM | POA: Diagnosis not present

## 2018-06-10 NOTE — Therapy (Signed)
[facility] [place], [place], [address] Phone: [phone]   Fax:  [phone]  Physical Therapy Treatment  Patient Details  Name: Misty Smith MRN: [id number] Date of Birth: 1940-09-30 Referring Provider (PT): Esmond Plants MD   Encounter Date: 06/10/2018  PT End of Session - 06/10/18 0916    Visit Number  21    Number of Visits  22    Date for PT Re-Evaluation  06/30/18    Authorization Type  MD F/U in 6 weeks from 06-02-18    PT Start Time  0817    PT Stop Time  0911    PT Time Calculation (min)  54 min       Past Medical History:  Diagnosis Date  . Anxiety   . Arthritis   . Bronchitis   . Cataract   . Colovaginal fistula   . Depression   . Diabetes mellitus without complication ([place])   . Dysrhythmia    palpitations, occasional PVCs; Dr Stanford Breed cardiologist  . Esophageal stricture   . Fractured elbow 1970's  . GERD (gastroesophageal reflux disease)   . H/O hiatal hernia   . Hyperlipidemia   . Hypertension   . Perimenopausal vasomotor symptoms   . Postmenopausal HRT (hormone replacement therapy)   . Pre-diabetes   . Stress   . Subacute lupus erythematosus    Dr. Sol Blazing      Past Surgical History:  Procedure Laterality Date  . abdominal bypass  1987   fibroids   . ABDOMINAL HYSTERECTOMY     partial  . bilateral tubal ligtation  1972  . CHOLECYSTECTOMY N/A 03/21/2014   Procedure: LAPAROSCOPIC CHOLECYSTECTOMY WITH INTRAOPERATIVE CHOLANGIOGRAM;  Surgeon: Shann Medal, MD;  Location: [facility];  Service: General;  Laterality: N/A;  . COLONOSCOPY    . elbow Right 1976   surgery after fracture of elbow  . EYE SURGERY Bilateral    cataracts  . ROTATOR CUFF REPAIR Right 03/29/2018  . TONSILLECTOMY    . TUBAL LIGATION      There were no vitals filed for this visit.                    [facility] Adult PT Treatment/Exercise - 06/10/18 0001      Exercises   Exercises  Shoulder      Shoulder Exercises: Seated   Diagonals  Strengthening;Both   2x10     Shoulder Exercises: Sidelying   Flexion  AROM;20 reps      Shoulder Exercises: Standing   External Rotation  Strengthening;Right;20 reps;Theraband;10 reps    Theraband Level (Shoulder External Rotation)  Level 1 (Yellow)    Internal Rotation  Strengthening;Right;20 reps;Theraband;10 reps    Theraband Level (Shoulder Internal Rotation)  Level 1 (Yellow)    Extension  Strengthening;Right;10 reps;20 reps;Theraband    Theraband Level (Shoulder Extension)  Level 1 (Yellow)    Row  Strengthening;Right;20 reps;Theraband    Theraband Level (Shoulder Row)  Level 1 (Yellow)    Retraction  Strengthening;Both;20 reps;Theraband    Theraband Level (Shoulder Retraction)  Level 2 (Red)      Shoulder Exercises: Pulleys   Flexion  5 minutes    Other Pulley Exercises  Standing  UE ranger into flexion, circles 3 x10 each      Electrical Stimulation   Electrical Stimulation Location  R shoulder Premod x 15 mins 80-150hz      Electrical Stimulation Goals  Pain      Vasopneumatic   Number  Minutes Vasopneumatic   15 minutes    Vasopnuematic Location   Shoulder    Vasopneumatic Pressure  Low    Vasopneumatic Temperature   34      Manual Therapy   Passive ROM  PAAROM in sitting for flexion 3x10 to 120 degrees today               PT Short Term Goals - 05/13/18 0952      PT SHORT TERM GOAL #1   Title  STG's=LTG's.        PT Long Term Goals - 06/07/18 8786      PT LONG TERM GOAL #1   Title  Independent with a HEP.    Time  6    Period  Weeks    Status  Achieved      PT LONG TERM GOAL #2   Title  Active right shoulder flexion to 145 degrees so the patient can easily reach overhead.    Time  6    Period  Weeks    Status  On-going   AROM 95 degrres 06/07/18     PT LONG TERM GOAL #3   Title  Active ER to 70 degrees+ to allow for easily donning/doffing of apparel.    Time  6    Period  Weeks    Status  On-going    AROM 60 degrees 06/07/18     PT LONG TERM GOAL #4   Title  Increase right shoulder strength to a solid 4/5 to increase stability for performance of functional activities.    Time  6    Period  Weeks    Status  On-going   NT 06/07/18     PT LONG TERM GOAL #5   Title  Increase ROM so patient is able to reach behind back to L3.    Time  6    Period  Weeks    Status  On-going      PT LONG TERM GOAL #6   Title  Perform ADL's with pain not > 3/10.    Time  6    Period  Weeks    Status  On-going   not performing all ADL's at this time 06/07/18           Plan - 06/10/18 0919    Clinical Impression Statement  Pt arrived today doing fairly well with RT shldr. She was able to complete all therex with mainly fatigue. Sidelying flexion was added to facilitate AROM against gravity. Normal modality response.    Clinical Presentation  Stable    Clinical Decision Making  Low    Clinical Impairments Affecting Rehab Potential  surgery 03/29/18 current 10 weeks 06/07/18    PT Frequency  2x / week    PT Duration  6 weeks    PT Treatment/Interventions  ADLs/Self Care Home Management;Cryotherapy;Electrical Stimulation;Ultrasound;Therapeutic exercise;Therapeutic activities;Patient/family education;Passive range of motion;Manual techniques;Vasopneumatic Device;Visual/perceptual remediation/compensation    PT Next Visit Plan  cont with POC per PT and  MD POC per RCR protocol    PT Home Exercise Plan  shoulder isometrics, AAROM shoulder flexion and ER       Patient will benefit from skilled therapeutic intervention in order to improve the following deficits and impairments:  Decreased activity tolerance, Decreased range of motion, Pain  Visit Diagnosis: Chronic right shoulder pain  Stiffness of right shoulder, not elsewhere classified     Problem List Patient Active Problem List   Diagnosis Date Noted  . Coronary  artery disease due to lipid rich plaque 03/18/2016  . Anxiety state  03/18/2016  . Vitamin D deficiency 03/18/2016  . 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 CAD (coronary artery disease) 12/30/2013  . 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 Osteopenia of the elderly 10/26/2013  . Generalized anxiety disorder 09/21/2013  . GERD (gastroesophageal reflux disease) 09/21/2013  . Pulmonary nodule seen on imaging study 09/21/2013  . SYNCOPE 02/02/2009  . Pre-diabetes 02/01/2009  . Hyperlipidemia 02/01/2009  . Essential hypertension 02/01/2009  . DIZZINESS 02/01/2009  . PALPITATIONS 02/01/2009    Sebastion Jun,CHRIS, PTA 06/10/2018, 9:22 AM  [facility] [place], [place], [address] Phone: [phone]   Fax:  [phone]  Name: Misty Smith MRN: [id number] Date of Birth: 1941-04-29

## 2018-06-14 ENCOUNTER — Ambulatory Visit: Payer: Medicare Other | Admitting: Physical Therapy

## 2018-06-14 ENCOUNTER — Encounter: Payer: Self-pay | Admitting: Physical Therapy

## 2018-06-14 DIAGNOSIS — M25511 Pain in right shoulder: Principal | ICD-10-CM

## 2018-06-14 DIAGNOSIS — M25611 Stiffness of right shoulder, not elsewhere classified: Secondary | ICD-10-CM

## 2018-06-14 DIAGNOSIS — G8929 Other chronic pain: Secondary | ICD-10-CM | POA: Diagnosis not present

## 2018-06-14 NOTE — Therapy (Signed)
[facility] [place], [place], [address] Phone: [phone]   Fax:  [phone]  Physical Therapy Treatment  Patient Details  Name: Misty Smith MRN: [id number] Date of Birth: 09-05-1940 Referring Provider (PT): Esmond Plants MD   Encounter Date: 06/14/2018  PT End of Session - 06/14/18 0823    Visit Number  22    Number of Visits  26    Date for PT Re-Evaluation  07/05/18    PT Start Time  0814    PT Stop Time  0906    PT Time Calculation (min)  52 min    Activity Tolerance  Patient tolerated treatment well    Behavior During Therapy  [facility] for tasks assessed/performed       Past Medical History:  Diagnosis Date  . Anxiety   . Arthritis   . Bronchitis   . Cataract   . Colovaginal fistula   . Depression   . Diabetes mellitus without complication ([place])   . Dysrhythmia    palpitations, occasional PVCs; Dr Stanford Breed cardiologist  . Esophageal stricture   . Fractured elbow 1970's  . GERD (gastroesophageal reflux disease)   . H/O hiatal hernia   . Hyperlipidemia   . Hypertension   . Perimenopausal vasomotor symptoms   . Postmenopausal HRT (hormone replacement therapy)   . Pre-diabetes   . Stress   . Subacute lupus erythematosus    Dr. Sol Blazing      Past Surgical History:  Procedure Laterality Date  . abdominal bypass  1987   fibroids   . ABDOMINAL HYSTERECTOMY     partial  . bilateral tubal ligtation  1972  . CHOLECYSTECTOMY N/A 03/21/2014   Procedure: LAPAROSCOPIC CHOLECYSTECTOMY WITH INTRAOPERATIVE CHOLANGIOGRAM;  Surgeon: Shann Medal, MD;  Location: [place] [facility];  Service: General;  Laterality: N/A;  . COLONOSCOPY    . elbow Right 1976   surgery after fracture of elbow  . EYE SURGERY Bilateral    cataracts  . ROTATOR CUFF REPAIR Right 03/29/2018  . TONSILLECTOMY    . TUBAL LIGATION      There were no vitals filed for this visit.  Subjective Assessment - 06/14/18 0817    Subjective  Patient arrived  with no new complaints, "my arm will not do what i want it to do"    Patient is accompained by:  Family member    Pertinent History  DM; Right elbow fracture.    Patient Stated Goals  Use right UE again.    Currently in Pain?  Yes    Pain Score  2     Pain Location  Shoulder    Pain Orientation  Right    Pain Descriptors / Indicators  Tightness;Discomfort    Pain Type  Surgical pain    Pain Onset  More than a month ago    Pain Frequency  Intermittent    Aggravating Factors   overhead movement    Pain Relieving Factors  with shoulder in resting position         [facility] PT Assessment - 06/14/18 0001      AROM   AROM Assessment Site  Shoulder    Right/Left Shoulder  Right    Right Shoulder Flexion  98 Degrees    Right Shoulder External Rotation  65 Degrees      PROM   Right Shoulder Flexion  146 Degrees    Right Shoulder External Rotation  70 Degrees  [facility] Adult PT Treatment/Exercise - 06/14/18 0001      Exercises   Exercises  Shoulder      Shoulder Exercises: Supine   Flexion  AROM;Strengthening;Right;20 reps    Other Supine Exercises  supine circles 2x10      Shoulder Exercises: Seated   Elevation  Strengthening;Both;AAROM;10 reps    Elevation Limitations  with cane      Shoulder Exercises: Standing   External Rotation  Strengthening;Right;20 reps;Theraband;10 reps    Theraband Level (Shoulder External Rotation)  Level 1 (Yellow)    Internal Rotation  Strengthening;Right;20 reps;Theraband;10 reps    Theraband Level (Shoulder Internal Rotation)  Level 2 (Red)    Row  Strengthening;Right;20 reps;Theraband    Theraband Level (Shoulder Row)  Level 2 (Red)    Retraction  Strengthening;Both;20 reps;Theraband    Theraband Level (Shoulder Retraction)  Level 2 (Red)    Other Standing Exercises  wall slide and lower, then 5 with eccentric lowering      Shoulder Exercises: Pulleys   Flexion  5 minutes    Other Pulley Exercises  Standing  UE  ranger into flexion, circles 2 x10 each      Shoulder Exercises: Stretch   Corner Stretch  3 reps;20 seconds      Electrical Stimulation   Electrical Stimulation Location  R shoulder Premod x 10 mins 80-_0      Electrical Stimulation Goals  Pain      Vasopneumatic   Number Minutes Vasopneumatic   10 minutes    Vasopnuematic Location   Shoulder    Vasopneumatic Pressure  Low      Manual Therapy   Manual Therapy  Passive ROM    Manual therapy comments  gentle ROM for right shoulder flexion/ER with holds end range               PT Short Term Goals - 05/13/18 0952      PT SHORT TERM GOAL #1   Title  STG's=LTG's.        PT Long Term Goals - 06/14/18 0849      PT LONG TERM GOAL #1   Title  Independent with a HEP.    Time  6    Period  Weeks    Status  Achieved      PT LONG TERM GOAL #2   Title  Active right shoulder flexion to 145 degrees so the patient can easily reach overhead.    Time  6    Period  Weeks    Status  On-going   AROM 98 degrees 06/14/18     PT LONG TERM GOAL #3   Title  Active ER to 70 degrees+ to allow for easily donning/doffing of apparel.    Time  6    Period  Weeks    Status  On-going   AROM 65 degrees 06/14/18     PT LONG TERM GOAL #4   Title  Increase right shoulder strength to a solid 4/5 to increase stability for performance of functional activities.    Time  6    Period  Weeks    Status  On-going   NT 06/14/18     PT LONG TERM GOAL #5   Title  Increase ROM so patient is able to reach behind back to L3.    Time  6    Period  Weeks    Status  On-going   NT 06/17/18     PT LONG TERM GOAL #6   Title  Perform ADL's with pain not > 3/10.    Time  6    Period  Weeks    Status  Achieved   met 06/14/18 with 2/10 pain            Plan - 06/14/18 0858    Clinical Impression Statement  Patient tolerated treatment well today. Patient able to progress with all exercises and improved ROM for right shoulder flexion and ER  today. Patient has reported she was able to vaccum and use broom to get dust off ceilings and othr ADL's. Patient met LTG #6 others progressing.    Clinical Impairments Affecting Rehab Potential  surgery 03/29/18 current 11 weeks 06/14/18    PT Frequency  2x / week    PT Duration  6 weeks    PT Treatment/Interventions  ADLs/Self Care Home Management;Cryotherapy;Electrical Stimulation;Ultrasound;Therapeutic exercise;Therapeutic activities;Patient/family education;Passive range of motion;Manual techniques;Vasopneumatic Device;Visual/perceptual remediation/compensation    PT Next Visit Plan  cont with POC per PT and  MD POC per RCR protocol    Consulted and Agree with Plan of Care  Patient       Patient will benefit from skilled therapeutic intervention in order to improve the following deficits and impairments:  Decreased activity tolerance, Decreased range of motion, Pain  Visit Diagnosis: Chronic right shoulder pain  Stiffness of right shoulder, not elsewhere classified     Problem List Patient Active Problem List   Diagnosis Date Noted  . Coronary artery disease due to lipid rich plaque 03/18/2016  . Anxiety state 03/18/2016  . Vitamin D deficiency 03/18/2016  . 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 CAD (coronary artery disease) 12/30/2013  . 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 Osteopenia of the elderly 10/26/2013  . Generalized anxiety disorder 09/21/2013  . GERD (gastroesophageal reflux disease) 09/21/2013  . Pulmonary nodule seen on imaging study 09/21/2013  . SYNCOPE 02/02/2009  . Pre-diabetes 02/01/2009  . Hyperlipidemia 02/01/2009  . Essential hypertension 02/01/2009  . DIZZINESS 02/01/2009  . PALPITATIONS 02/01/2009    Phillips Climes, PTA 06/14/2018, 9:08 AM  [facility] [place], [place], [address] Phone: [phone]   Fax:  [phone]  Name: Misty Smith MRN: [id number] Date of Birth:  [birth year]

## 2018-06-15 ENCOUNTER — Ambulatory Visit (INDEPENDENT_AMBULATORY_CARE_PROVIDER_SITE_OTHER): Payer: Medicare Other

## 2018-06-15 DIAGNOSIS — Z23 Encounter for immunization: Secondary | ICD-10-CM

## 2018-06-16 DIAGNOSIS — M62830 Muscle spasm of back: Secondary | ICD-10-CM | POA: Diagnosis not present

## 2018-06-16 DIAGNOSIS — M9903 Segmental and somatic dysfunction of lumbar region: Secondary | ICD-10-CM | POA: Diagnosis not present

## 2018-06-16 DIAGNOSIS — M9901 Segmental and somatic dysfunction of cervical region: Secondary | ICD-10-CM | POA: Diagnosis not present

## 2018-06-16 DIAGNOSIS — M9904 Segmental and somatic dysfunction of sacral region: Secondary | ICD-10-CM | POA: Diagnosis not present

## 2018-06-17 ENCOUNTER — Ambulatory Visit: Payer: Medicare Other | Admitting: *Deleted

## 2018-06-17 DIAGNOSIS — M25611 Stiffness of right shoulder, not elsewhere classified: Secondary | ICD-10-CM | POA: Diagnosis not present

## 2018-06-17 DIAGNOSIS — G8929 Other chronic pain: Secondary | ICD-10-CM | POA: Diagnosis not present

## 2018-06-17 DIAGNOSIS — M25511 Pain in right shoulder: Secondary | ICD-10-CM | POA: Diagnosis not present

## 2018-06-17 NOTE — Therapy (Signed)
[facility] [place], [place], [address] Phone: [phone]   Fax:  [phone]  Physical Therapy Treatment  Patient Details  Name: Misty Smith MRN: [id number] Date of Birth: 1940-09-10 Referring Provider (PT): Esmond Plants MD   Encounter Date: 06/17/2018  PT End of Session - 06/17/18 1004    Visit Number  23    Number of Visits  26    Date for PT Re-Evaluation  07/05/18    Authorization Type  MD F/U in 6 weeks from 06-02-18    PT Start Time  0815    PT Stop Time  0907    PT Time Calculation (min)  52 min       Past Medical History:  Diagnosis Date  . Anxiety   . Arthritis   . Bronchitis   . Cataract   . Colovaginal fistula   . Depression   . Diabetes mellitus without complication ([place])   . Dysrhythmia    palpitations, occasional PVCs; Dr Stanford Breed cardiologist  . Esophageal stricture   . Fractured elbow 1970's  . GERD (gastroesophageal reflux disease)   . H/O hiatal hernia   . Hyperlipidemia   . Hypertension   . Perimenopausal vasomotor symptoms   . Postmenopausal HRT (hormone replacement therapy)   . Pre-diabetes   . Stress   . Subacute lupus erythematosus    Dr. Sol Blazing      Past Surgical History:  Procedure Laterality Date  . abdominal bypass  1987   fibroids   . ABDOMINAL HYSTERECTOMY     partial  . bilateral tubal ligtation  1972  . CHOLECYSTECTOMY N/A 03/21/2014   Procedure: LAPAROSCOPIC CHOLECYSTECTOMY WITH INTRAOPERATIVE CHOLANGIOGRAM;  Surgeon: Shann Medal, MD;  Location: [facility];  Service: General;  Laterality: N/A;  . COLONOSCOPY    . elbow Right 1976   surgery after fracture of elbow  . EYE SURGERY Bilateral    cataracts  . ROTATOR CUFF REPAIR Right 03/29/2018  . TONSILLECTOMY    . TUBAL LIGATION      There were no vitals filed for this visit.  Subjective Assessment - 06/17/18 0828    Subjective  RT shldr is sore from pulling myself up into a truck    Pertinent History  DM;  Right elbow fracture.    Patient Stated Goals  Use right UE again.    Currently in Pain?  Yes    Pain Score  2     Pain Orientation  Right    Pain Descriptors / Indicators  Sore;Tightness    Pain Type  Surgical pain    Pain Onset  More than a month ago    Pain Frequency  Intermittent                       [facility] Adult PT Treatment/Exercise - 06/17/18 0001      Exercises   Exercises  Shoulder      Shoulder Exercises: Sidelying   External Rotation  Strengthening;Right;20 reps;10 reps    External Rotation Weight (lbs)  1    Flexion  AROM;20 reps;10 reps      Shoulder Exercises: Standing   External Rotation  Strengthening;Right;20 reps;Theraband;10 reps    Theraband Level (Shoulder External Rotation)  Level 1 (Yellow)    Internal Rotation  Strengthening;Right;20 reps;Theraband;10 reps    Theraband Level (Shoulder Internal Rotation)  Level 2 (Red)    Extension  Strengthening;Right;10 reps;20 reps;Theraband    Theraband Level (Shoulder Extension)  Level 2 (Red)    Row  Strengthening;Right;20 reps;Theraband;10 reps    Theraband Level (Shoulder Row)  Level 2 (Red)    Shoulder Elevation  AAROM;Right;10 reps;Standing   3x10     Shoulder Exercises: Pulleys   Flexion  5 minutes    Other Pulley Exercises  Standing  UE ranger into flexion, circles 2 x10 each      Electrical Stimulation   Electrical Stimulation Location  R shoulder Premod x 10 mins 80-'150hz'$      Electrical Stimulation Goals  Pain      Vasopneumatic   Number Minutes Vasopneumatic   10 minutes    Vasopnuematic Location   Shoulder    Vasopneumatic Pressure  Low    Vasopneumatic Temperature   34      Manual Therapy   Manual Therapy  Passive ROM    Passive ROM  AAROM  for flexion               PT Short Term Goals - 05/13/18 3151      PT SHORT TERM GOAL #1   Title  STG's=LTG's.        PT Long Term Goals - 06/14/18 0849      PT LONG TERM GOAL #1   Title  Independent with a HEP.    Time   6    Period  Weeks    Status  Achieved      PT LONG TERM GOAL #2   Title  Active right shoulder flexion to 145 degrees so the patient can easily reach overhead.    Time  6    Period  Weeks    Status  On-going   AROM 98 degrees 06/14/18     PT LONG TERM GOAL #3   Title  Active ER to 70 degrees+ to allow for easily donning/doffing of apparel.    Time  6    Period  Weeks    Status  On-going   AROM 65 degrees 06/14/18     PT LONG TERM GOAL #4   Title  Increase right shoulder strength to a solid 4/5 to increase stability for performance of functional activities.    Time  6    Period  Weeks    Status  On-going   NT 06/14/18     PT LONG TERM GOAL #5   Title  Increase ROM so patient is able to reach behind back to L3.    Time  6    Period  Weeks    Status  On-going   NT 06/17/18     PT LONG TERM GOAL #6   Title  Perform ADL's with pain not > 3/10.    Time  6    Period  Weeks    Status  Achieved   met 06/14/18 with 2/10 pain            Plan - 06/17/18 1009    Clinical Impression Statement  Pt arrived today doing fairly well with RT shldr. She was able to perform standing flexion to 120 degrees and PROM was 140 degrees in standing. Pt was also able to perform red tband in all exs, but stayed with yellow on ER. Sidelying flexion was performed today as well as ER in sidelying without complaints. Normal modality response today.    Clinical Presentation  Stable    Clinical Decision Making  Low    Clinical Impairments Affecting Rehab Potential  surgery 03/29/18 current 11 weeks 06/14/18  PT Frequency  2x / week    PT Duration  6 weeks    PT Treatment/Interventions  ADLs/Self Care Home Management;Cryotherapy;Electrical Stimulation;Ultrasound;Therapeutic exercise;Therapeutic activities;Patient/family education;Passive range of motion;Manual techniques;Vasopneumatic Device;Visual/perceptual remediation/compensation    PT Next Visit Plan  cont with POC per PT and  MD POC per RCR  protocol    PT Home Exercise Plan  shoulder isometrics, AAROM shoulder flexion and ER       Patient will benefit from skilled therapeutic intervention in order to improve the following deficits and impairments:  Decreased activity tolerance, Decreased range of motion, Pain  Visit Diagnosis: Chronic right shoulder pain  Stiffness of right shoulder, not elsewhere classified     Problem List Patient Active Problem List   Diagnosis Date Noted  . Coronary artery disease due to lipid rich plaque 03/18/2016  . Anxiety state 03/18/2016  . Vitamin D deficiency 03/18/2016  . 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 CAD (coronary artery disease) 12/30/2013  . 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 Osteopenia of the elderly 10/26/2013  . Generalized anxiety disorder 09/21/2013  . GERD (gastroesophageal reflux disease) 09/21/2013  . Pulmonary nodule seen on imaging study 09/21/2013  . SYNCOPE 02/02/2009  . Pre-diabetes 02/01/2009  . Hyperlipidemia 02/01/2009  . Essential hypertension 02/01/2009  . DIZZINESS 02/01/2009  . PALPITATIONS 02/01/2009    RAMSEUR,CHRIS, PTA 06/17/2018, 10:17 AM  [facility] [place], [place], [address] Phone: [phone]   Fax:  [phone]  Name: DASHIA CALDEIRA MRN: [id number] Date of Birth: [birth year]

## 2018-06-20 ENCOUNTER — Other Ambulatory Visit: Payer: Self-pay | Admitting: Family Medicine

## 2018-06-21 ENCOUNTER — Ambulatory Visit: Payer: Medicare Other | Admitting: Physical Therapy

## 2018-06-21 ENCOUNTER — Encounter: Payer: Self-pay | Admitting: Physical Therapy

## 2018-06-21 DIAGNOSIS — M25511 Pain in right shoulder: Principal | ICD-10-CM

## 2018-06-21 DIAGNOSIS — M25611 Stiffness of right shoulder, not elsewhere classified: Secondary | ICD-10-CM | POA: Diagnosis not present

## 2018-06-21 DIAGNOSIS — G8929 Other chronic pain: Secondary | ICD-10-CM

## 2018-06-21 NOTE — Therapy (Signed)
[facility] [place], [place], [address] Phone: [phone]   Fax:  [phone]  Physical Therapy Treatment  Patient Details  Name: Misty Smith MRN: [id number] Date of Birth: 1941/05/30 Referring Provider (PT): Esmond Plants MD   Encounter Date: 06/21/2018  PT End of Session - 06/21/18 0825    Visit Number  24    Number of Visits  26    Date for PT Re-Evaluation  07/05/18    Authorization Type  MD F/U in 6 weeks from 06-02-18    PT Start Time  0816    PT Stop Time  0910    PT Time Calculation (min)  54 min    Activity Tolerance  Patient tolerated treatment well    Behavior During Therapy  [facility] for tasks assessed/performed       Past Medical History:  Diagnosis Date  . Anxiety   . Arthritis   . Bronchitis   . Cataract   . Colovaginal fistula   . Depression   . Diabetes mellitus without complication ([place])   . Dysrhythmia    palpitations, occasional PVCs; Dr Stanford Breed cardiologist  . Esophageal stricture   . Fractured elbow 1970's  . GERD (gastroesophageal reflux disease)   . H/O hiatal hernia   . Hyperlipidemia   . Hypertension   . Perimenopausal vasomotor symptoms   . Postmenopausal HRT (hormone replacement therapy)   . Pre-diabetes   . Stress   . Subacute lupus erythematosus    Dr. Sol Blazing      Past Surgical History:  Procedure Laterality Date  . abdominal bypass  1987   fibroids   . ABDOMINAL HYSTERECTOMY     partial  . bilateral tubal ligtation  1972  . CHOLECYSTECTOMY N/A 03/21/2014   Procedure: LAPAROSCOPIC CHOLECYSTECTOMY WITH INTRAOPERATIVE CHOLANGIOGRAM;  Surgeon: Shann Medal, MD;  Location: [place];  Service: General;  Laterality: N/A;  . COLONOSCOPY    . elbow Right 1976   surgery after fracture of elbow  . EYE SURGERY Bilateral    cataracts  . ROTATOR CUFF REPAIR Right 03/29/2018  . TONSILLECTOMY    . TUBAL LIGATION      There were no vitals filed for this visit.  Subjective  Assessment - 06/21/18 0825    Subjective  Reports only having pain or discomfort if she pushes herself too much.    Pertinent History  DM; Right elbow fracture.    Patient Stated Goals  Use right UE again.    Currently in Pain?  No/denies         [facility] PT Assessment - 06/21/18 0001      Assessment   Medical Diagnosis  Right RCR.    Referring Provider (PT)  Esmond Plants MD    Onset Date/Surgical Date  03/29/18    Next MD Visit  07/14/2018      Restrictions   Weight Bearing Restrictions  No                   [facility] Adult PT Treatment/Exercise - 06/21/18 0001      Exercises   Exercises  Shoulder      Shoulder Exercises: Sidelying   External Rotation  AROM;Right;Limitations    External Rotation Limitations  x30 reps    Flexion  AROM;Right;20 reps      Shoulder Exercises: Standing   Protraction  Strengthening;Right;20 reps;Theraband    Theraband Level (Shoulder Protraction)  Level 2 (Red)    External Rotation  Strengthening;Right;20 reps;Theraband;10  reps    Theraband Level (Shoulder External Rotation)  Level 1 (Yellow)    Internal Rotation  Strengthening;Right;20 reps;Theraband;10 reps    Theraband Level (Shoulder Internal Rotation)  Level 2 (Red)    Extension  Strengthening;Right;20 reps;Theraband    Theraband Level (Shoulder Extension)  Level 2 (Red)    Row  Strengthening;Right;20 reps;Theraband;10 reps    Theraband Level (Shoulder Row)  Level 2 (Red)    Other Standing Exercises  R shoulder cone, coffee cup lifts to bottom cabinet x20 reps each    Other Standing Exercises  AAROM ext x20 reps      Shoulder Exercises: Pulleys   Flexion  5 minutes      Shoulder Exercises: ROM/Strengthening   UBE (Upper Arm Bike)  120 RPM x6 min forward/backward      Modalities   Modalities  Electrical Stimulation;Vasopneumatic      Electrical Stimulation   Electrical Stimulation Location  R shoulder    Electrical Stimulation Action  Pre-Mod    Electrical Stimulation  Parameters  80-150 hz x15 min    Electrical Stimulation Goals  Pain      Vasopneumatic   Number Minutes Vasopneumatic   15 minutes    Vasopnuematic Location   Shoulder    Vasopneumatic Pressure  Low    Vasopneumatic Temperature   34      Manual Therapy   Manual Therapy  Passive ROM    Passive ROM  PROM of R shoulder into flexion, ER, IR with gentle holds at end range               PT Short Term Goals - 05/13/18 0952      PT SHORT TERM GOAL #1   Title  STG's=LTG's.        PT Long Term Goals - 06/14/18 0849      PT LONG TERM GOAL #1   Title  Independent with a HEP.    Time  6    Period  Weeks    Status  Achieved      PT LONG TERM GOAL #2   Title  Active right shoulder flexion to 145 degrees so the patient can easily reach overhead.    Time  6    Period  Weeks    Status  On-going   AROM 98 degrees 06/14/18     PT LONG TERM GOAL #3   Title  Active ER to 70 degrees+ to allow for easily donning/doffing of apparel.    Time  6    Period  Weeks    Status  On-going   AROM 65 degrees 06/14/18     PT LONG TERM GOAL #4   Title  Increase right shoulder strength to a solid 4/5 to increase stability for performance of functional activities.    Time  6    Period  Weeks    Status  On-going   NT 06/14/18     PT LONG TERM GOAL #5   Title  Increase ROM so patient is able to reach behind back to L3.    Time  6    Period  Weeks    Status  On-going   NT 06/17/18     PT LONG TERM GOAL #6   Title  Perform ADL's with pain not > 3/10.    Time  6    Period  Weeks    Status  Achieved   met 06/14/18 with 2/10 pain  Plan - 06/21/18 0940    Clinical Impression Statement  Patient arrived today with no complaints upon arrival. Patient progressed through ROM and strengthening exercises fairly well with greatest complaint of fatigue. Cueing provided throughout treatment for proper technique to avoid inappropriate compensatory strategies. Firm end feels and  smooth arc of motion noted during PROM of R shoulder. Normal modalities response noted following removal of the modalities.    Clinical Impairments Affecting Rehab Potential  surgery 03/29/18 current 11 weeks 06/14/18    PT Frequency  2x / week    PT Duration  6 weeks    PT Treatment/Interventions  ADLs/Self Care Home Management;Cryotherapy;Electrical Stimulation;Ultrasound;Therapeutic exercise;Therapeutic activities;Patient/family education;Passive range of motion;Manual techniques;Vasopneumatic Device;Visual/perceptual remediation/compensation    PT Next Visit Plan  cont with POC per PT and  MD POC per RCR protocol    PT Home Exercise Plan  shoulder isometrics, AAROM shoulder flexion and ER    Consulted and Agree with Plan of Care  Patient       Patient will benefit from skilled therapeutic intervention in order to improve the following deficits and impairments:  Decreased activity tolerance, Decreased range of motion, Pain  Visit Diagnosis: Chronic right shoulder pain  Stiffness of right shoulder, not elsewhere classified     Problem List Patient Active Problem List   Diagnosis Date Noted  . Coronary artery disease due to lipid rich plaque 03/18/2016  . Anxiety state 03/18/2016  . Vitamin D deficiency 03/18/2016  . 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 CAD (coronary artery disease) 12/30/2013  . 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 Osteopenia of the elderly 10/26/2013  . Generalized anxiety disorder 09/21/2013  . GERD (gastroesophageal reflux disease) 09/21/2013  . Pulmonary nodule seen on imaging study 09/21/2013  . SYNCOPE 02/02/2009  . Pre-diabetes 02/01/2009  . Hyperlipidemia 02/01/2009  . Essential hypertension 02/01/2009  . DIZZINESS 02/01/2009  . PALPITATIONS 02/01/2009    Standley Brooking, PTA 06/21/2018, 9:49 AM  [facility] [address] [place], [place], [address] Phone: [phone]   Fax:   [phone]  Name: ALLIANNA BEAUBIEN MRN: [id number] Date of Birth: 10-10-1940

## 2018-06-23 DIAGNOSIS — M62830 Muscle spasm of back: Secondary | ICD-10-CM | POA: Diagnosis not present

## 2018-06-23 DIAGNOSIS — M9903 Segmental and somatic dysfunction of lumbar region: Secondary | ICD-10-CM | POA: Diagnosis not present

## 2018-06-23 DIAGNOSIS — M9904 Segmental and somatic dysfunction of sacral region: Secondary | ICD-10-CM | POA: Diagnosis not present

## 2018-06-23 DIAGNOSIS — M9901 Segmental and somatic dysfunction of cervical region: Secondary | ICD-10-CM | POA: Diagnosis not present

## 2018-06-24 ENCOUNTER — Ambulatory Visit: Payer: Medicare Other | Admitting: Physical Therapy

## 2018-06-24 DIAGNOSIS — M25611 Stiffness of right shoulder, not elsewhere classified: Secondary | ICD-10-CM

## 2018-06-24 DIAGNOSIS — M25511 Pain in right shoulder: Principal | ICD-10-CM

## 2018-06-24 DIAGNOSIS — G8929 Other chronic pain: Secondary | ICD-10-CM | POA: Diagnosis not present

## 2018-06-24 NOTE — Therapy (Signed)
[facility] [place], [place], [address] Phone: [phone]   Fax:  [phone]  Physical Therapy Treatment  Patient Details  Name: Misty Smith MRN: [id number] Date of Birth: 1941-05-19 Referring Provider (PT): Esmond Plants MD   Encounter Date: 06/24/2018  PT End of Session - 06/24/18 0900    Visit Number  25    Number of Visits  26    Date for PT Re-Evaluation  07/05/18    PT Start Time  0818    PT Stop Time  0908    PT Time Calculation (min)  50 min    Activity Tolerance  Patient tolerated treatment well    Behavior During Therapy  [facility] for tasks assessed/performed       Past Medical History:  Diagnosis Date  . Anxiety   . Arthritis   . Bronchitis   . Cataract   . Colovaginal fistula   . Depression   . Diabetes mellitus without complication ([place])   . Dysrhythmia    palpitations, occasional PVCs; Dr Stanford Breed cardiologist  . Esophageal stricture   . Fractured elbow 1970's  . GERD (gastroesophageal reflux disease)   . H/O hiatal hernia   . Hyperlipidemia   . Hypertension   . Perimenopausal vasomotor symptoms   . Postmenopausal HRT (hormone replacement therapy)   . Pre-diabetes   . Stress   . Subacute lupus erythematosus    Dr. Sol Blazing      Past Surgical History:  Procedure Laterality Date  . abdominal bypass  1987   fibroids   . ABDOMINAL HYSTERECTOMY     partial  . bilateral tubal ligtation  1972  . CHOLECYSTECTOMY N/A 03/21/2014   Procedure: LAPAROSCOPIC CHOLECYSTECTOMY WITH INTRAOPERATIVE CHOLANGIOGRAM;  Surgeon: Shann Medal, MD;  Location: [place];  Service: General;  Laterality: N/A;  . COLONOSCOPY    . elbow Right 1976   surgery after fracture of elbow  . EYE SURGERY Bilateral    cataracts  . ROTATOR CUFF REPAIR Right 03/29/2018  . TONSILLECTOMY    . TUBAL LIGATION      There were no vitals filed for this visit.  Subjective Assessment - 06/24/18 1023    Subjective  Reports shoulder  soreness from previous treatment.    Pertinent History  DM; Right elbow fracture.    Patient Stated Goals  Use right UE again.    Currently in Pain?  Yes    Pain Score  --   No pain scale provided   Pain Location  Shoulder    Pain Orientation  Right    Pain Descriptors / Indicators  Sore    Pain Type  Surgical pain    Pain Onset  More than a month ago         [facility] PT Assessment - 06/24/18 0001      Assessment   Medical Diagnosis  Right RCR.    Referring Provider (PT)  Esmond Plants MD    Onset Date/Surgical Date  03/29/18    Next MD Visit  07/14/2018      Restrictions   Weight Bearing Restrictions  No                   [facility] Adult PT Treatment/Exercise - 06/24/18 0001      Exercises   Exercises  Shoulder      Shoulder Exercises: Supine   Diagonals  AROM;Right;20 reps      Shoulder Exercises: Seated   Flexion  AROM;Right;20 reps  Other Seated Exercises  AROM L shoulder scaption x20 reps      Shoulder Exercises: Sidelying   External Rotation  AROM;Right;20 reps    Flexion  AROM;Right;20 reps      Shoulder Exercises: Standing   Protraction  Strengthening;Right;20 reps;Theraband    Theraband Level (Shoulder Protraction)  Level 2 (Red)    External Rotation  Strengthening;Right;20 reps;Theraband;10 reps    Theraband Level (Shoulder External Rotation)  Level 1 (Yellow)    Internal Rotation  Strengthening;Right;20 reps;Theraband;10 reps    Theraband Level (Shoulder Internal Rotation)  Level 2 (Red)    Extension  Strengthening;Right;20 reps;Theraband    Theraband Level (Shoulder Extension)  Level 2 (Red)    Row  Strengthening;Right;20 reps;Theraband;10 reps    Theraband Level (Shoulder Row)  Level 2 (Red)      Modalities   Modalities  Electrical Stimulation;Vasopneumatic      Acupuncturist Location  R shoulder    Electrical Stimulation Action  Pre-Mod    Electrical Stimulation Parameters  80-150 hz x15 min     Electrical Stimulation Goals  Pain      Vasopneumatic   Number Minutes Vasopneumatic   15 minutes    Vasopnuematic Location   Shoulder    Vasopneumatic Pressure  Low    Vasopneumatic Temperature   34               PT Short Term Goals - 05/13/18 2703      PT SHORT TERM GOAL #1   Title  STG's=LTG's.        PT Long Term Goals - 06/14/18 0849      PT LONG TERM GOAL #1   Title  Independent with a HEP.    Time  6    Period  Weeks    Status  Achieved      PT LONG TERM GOAL #2   Title  Active right shoulder flexion to 145 degrees so the patient can easily reach overhead.    Time  6    Period  Weeks    Status  On-going   AROM 98 degrees 06/14/18     PT LONG TERM GOAL #3   Title  Active ER to 70 degrees+ to allow for easily donning/doffing of apparel.    Time  6    Period  Weeks    Status  On-going   AROM 65 degrees 06/14/18     PT LONG TERM GOAL #4   Title  Increase right shoulder strength to a solid 4/5 to increase stability for performance of functional activities.    Time  6    Period  Weeks    Status  On-going   NT 06/14/18     PT LONG TERM GOAL #5   Title  Increase ROM so patient is able to reach behind back to L3.    Time  6    Period  Weeks    Status  On-going   NT 06/17/18     PT LONG TERM GOAL #6   Title  Perform ADL's with pain not > 3/10.    Time  6    Period  Weeks    Status  Achieved   met 06/14/18 with 2/10 pain            Plan - 06/24/18 0902    Clinical Impression Statement  Patient tolerated today's treatment fairly well although presenting with R shoulder soreness. Patient through AROM and strengthening exercises against  gravity and monitoring for any compensatory strategies. Patient able to correct minimal shoulder elevation in R shoulder with seated flexion and scaption following demo and VCs. Patient reported more fatigue following therex. Normal modalities response noted following removal of the modalities.    Clinical  Impairments Affecting Rehab Potential  surgery 03/29/18 current 11 weeks 06/14/18    PT Frequency  2x / week    PT Duration  6 weeks    PT Treatment/Interventions  ADLs/Self Care Home Management;Cryotherapy;Electrical Stimulation;Ultrasound;Therapeutic exercise;Therapeutic activities;Patient/family education;Passive range of motion;Manual techniques;Vasopneumatic Device;Visual/perceptual remediation/compensation    PT Next Visit Plan  cont with POC per PT and  MD POC per RCR protocol    PT Home Exercise Plan  shoulder isometrics, AAROM shoulder flexion and ER    Consulted and Agree with Plan of Care  Patient       Patient will benefit from skilled therapeutic intervention in order to improve the following deficits and impairments:  Decreased activity tolerance, Decreased range of motion, Pain  Visit Diagnosis: Chronic right shoulder pain  Stiffness of right shoulder, not elsewhere classified     Problem List Patient Active Problem List   Diagnosis Date Noted  . Coronary artery disease due to lipid rich plaque 03/18/2016  . Anxiety state 03/18/2016  . Vitamin D deficiency 03/18/2016  . 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 CAD (coronary artery disease) 12/30/2013  . 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 Osteopenia of the elderly 10/26/2013  . Generalized anxiety disorder 09/21/2013  . GERD (gastroesophageal reflux disease) 09/21/2013  . Pulmonary nodule seen on imaging study 09/21/2013  . SYNCOPE 02/02/2009  . Pre-diabetes 02/01/2009  . Hyperlipidemia 02/01/2009  . Essential hypertension 02/01/2009  . DIZZINESS 02/01/2009  . PALPITATIONS 02/01/2009    Standley Brooking, PTA 06/24/2018, 10:24 AM  [facility] [address] [place], [place], [address] Phone: [phone]   Fax:  [phone]  Name: Misty Smith MRN: [id number] Date of Birth: 20-Jul-1941

## 2018-06-28 ENCOUNTER — Ambulatory Visit: Payer: Medicare Other | Admitting: Physical Therapy

## 2018-06-28 ENCOUNTER — Encounter: Payer: Self-pay | Admitting: Physical Therapy

## 2018-06-28 DIAGNOSIS — M25511 Pain in right shoulder: Secondary | ICD-10-CM | POA: Diagnosis not present

## 2018-06-28 DIAGNOSIS — G8929 Other chronic pain: Secondary | ICD-10-CM | POA: Diagnosis not present

## 2018-06-28 DIAGNOSIS — M25611 Stiffness of right shoulder, not elsewhere classified: Secondary | ICD-10-CM

## 2018-06-28 NOTE — Therapy (Signed)
[facility] [place], [place], [address] Phone: [phone]   Fax:  [phone]  Physical Therapy Treatment/Discharge  Patient Details  Name: Misty Smith MRN: [id number] Date of Birth: 26-Jan-1941 Referring Provider (PT): Esmond Plants MD   Encounter Date: 06/28/2018  PT End of Session - 06/28/18 0818    Visit Number  26    Number of Visits  26    Date for PT Re-Evaluation  07/05/18    PT Start Time  0816    PT Stop Time  0903    PT Time Calculation (min)  47 min    Activity Tolerance  Patient tolerated treatment well    Behavior During Therapy  [facility] for tasks assessed/performed       Past Medical History:  Diagnosis Date  . Anxiety   . Arthritis   . Bronchitis   . Cataract   . Colovaginal fistula   . Depression   . Diabetes mellitus without complication ([place])   . Dysrhythmia    palpitations, occasional PVCs; Dr Stanford Breed cardiologist  . Esophageal stricture   . Fractured elbow 1970's  . GERD (gastroesophageal reflux disease)   . H/O hiatal hernia   . Hyperlipidemia   . Hypertension   . Perimenopausal vasomotor symptoms   . Postmenopausal HRT (hormone replacement therapy)   . Pre-diabetes   . Stress   . Subacute lupus erythematosus    Dr. Sol Blazing      Past Surgical History:  Procedure Laterality Date  . abdominal bypass  1987   fibroids   . ABDOMINAL HYSTERECTOMY     partial  . bilateral tubal ligtation  1972  . CHOLECYSTECTOMY N/A 03/21/2014   Procedure: LAPAROSCOPIC CHOLECYSTECTOMY WITH INTRAOPERATIVE CHOLANGIOGRAM;  Surgeon: Shann Medal, MD;  Location: [place];  Service: General;  Laterality: N/A;  . COLONOSCOPY    . elbow Right 1976   surgery after fracture of elbow  . EYE SURGERY Bilateral    cataracts  . ROTATOR CUFF REPAIR Right 03/29/2018  . TONSILLECTOMY    . TUBAL LIGATION      There were no vitals filed for this visit.  Subjective Assessment - 06/28/18 0816    Subjective   Reports "a little" soreness upon arrival.    Pertinent History  DM; Right elbow fracture.    Patient Stated Goals  Use right UE again.    Currently in Pain?  Yes    Pain Score  --   No pain score provided   Pain Location  Shoulder    Pain Orientation  Right    Pain Descriptors / Indicators  Sore    Pain Type  Surgical pain    Pain Onset  More than a month ago         [facility] PT Assessment - 06/28/18 0001      Assessment   Medical Diagnosis  Right RCR.    Referring Provider (PT)  Esmond Plants MD    Onset Date/Surgical Date  03/29/18    Next MD Visit  07/14/2018      Restrictions   Weight Bearing Restrictions  No      ROM / Strength   AROM / PROM / Strength  AROM;Strength      AROM   Overall AROM   Deficits    AROM Assessment Site  Shoulder    Right/Left Shoulder  Right    Right Shoulder Flexion  140 Degrees    Right Shoulder Internal Rotation  66 Degrees  Right Shoulder External Rotation  50 Degrees      Strength   Overall Strength  Deficits    Strength Assessment Site  Shoulder    Right/Left Shoulder  Right    Right Shoulder Flexion  4+/5    Right Shoulder Internal Rotation  5/5    Right Shoulder External Rotation  4+/5                   [facility] Adult PT Treatment/Exercise - 06/28/18 0001      Shoulder Exercises: Sidelying   External Rotation  Strengthening;Right;20 reps;Weights    External Rotation Weight (lbs)  1    Flexion  Strengthening;Right;20 reps;Weights    Flexion Weight (lbs)  1      Shoulder Exercises: Standing   Protraction  Strengthening;Right;20 reps;Theraband    Theraband Level (Shoulder Protraction)  Level 2 (Red)    External Rotation  Strengthening;Right;20 reps;Theraband;10 reps    Theraband Level (Shoulder External Rotation)  Level 2 (Red)    Internal Rotation  Strengthening;Right;20 reps;Theraband;10 reps    Theraband Level (Shoulder Internal Rotation)  Level 2 (Red)    Flexion  Strengthening;Right;20 reps;Weights     Shoulder Flexion Weight (lbs)  1    Extension  Strengthening;Right;20 reps;Theraband    Theraband Level (Shoulder Extension)  Level 2 (Red)    Other Standing Exercises  R shoulder scaption 1# x20 reps      Shoulder Exercises: Pulleys   Flexion  3 minutes      Shoulder Exercises: ROM/Strengthening   UBE (Upper Arm Bike)  90 RPM x 6 min      Modalities   Modalities  Electrical Stimulation;Vasopneumatic      Electrical Stimulation   Electrical Stimulation Location  R shoulder    Electrical Stimulation Action  Pre-Mod    Electrical Stimulation Parameters  80-150 hz x15 min    Electrical Stimulation Goals  Pain      Vasopneumatic   Number Minutes Vasopneumatic   15 minutes    Vasopnuematic Location   Shoulder    Vasopneumatic Pressure  Low    Vasopneumatic Temperature   53             PT Education - 06/28/18 0905    Education Details  HEP- wall slides, resisted ER, IR, ext with red theraband    Person(s) Educated  Patient    Methods  Explanation;Demonstration;Handout    Comprehension  Verbalized understanding       PT Short Term Goals - 05/13/18 0952      PT SHORT TERM GOAL #1   Title  STG's=LTG's.        PT Long Term Goals - 06/28/18 0848      PT LONG TERM GOAL #1   Title  Independent with a HEP.    Time  6    Period  Weeks    Status  Achieved      PT LONG TERM GOAL #2   Title  Active right shoulder flexion to 145 degrees so the patient can easily reach overhead.    Time  6    Period  Weeks    Status  On-going   AROM 140 degrees 06/28/18     PT LONG TERM GOAL #3   Title  Active ER to 70 degrees+ to allow for easily donning/doffing of apparel.    Time  6    Period  Weeks    Status  On-going   AROM 70 degrees 06/28/18  PT LONG TERM GOAL #4   Title  Increase right shoulder strength to a solid 4/5 to increase stability for performance of functional activities.    Time  6    Period  Weeks    Status  Achieved      PT LONG TERM GOAL #5   Title   Increase ROM so patient is able to reach behind back to L3.    Time  6    Period  Weeks    Status  On-going   NT 06/17/18     PT LONG TERM GOAL #6   Title  Perform ADL's with pain not > 3/10.    Time  6    Period  Weeks    Status  Achieved   met 06/14/18 with 2/10 pain            Plan - 06/28/18 0905    Clinical Impression Statement  Patient has progressed well in PT since R RCR. Patient denies any functional limitations at this time with dressing or any ADLs. Patient able to complete all exercises as directed with minimal to moderate multimodal cueing to ensure proper technique. Patient limited with R shoulder ER but may be from past injury per patient as she was limited prior to RCR. Patient to achieve all goals set at evaluation except for R shoulder flexion, ER and reaching behind her back. Normal modalities response noted following removal of the modalities.    Clinical Impairments Affecting Rehab Potential  surgery 03/29/18 current 11 weeks 06/14/18    PT Frequency  2x / week    PT Duration  6 weeks    PT Treatment/Interventions  ADLs/Self Care Home Management;Cryotherapy;Electrical Stimulation;Ultrasound;Therapeutic exercise;Therapeutic activities;Patient/family education;Passive range of motion;Manual techniques;Vasopneumatic Device;Visual/perceptual remediation/compensation    PT Next Visit Plan  D/C summary required.    PT Home Exercise Plan  shoulder isometrics, AAROM shoulder flexion and ER; resisted ER, IR, ext, wall slides    Consulted and Agree with Plan of Care  Patient       Patient will benefit from skilled therapeutic intervention in order to improve the following deficits and impairments:  Decreased activity tolerance, Decreased range of motion, Pain  Visit Diagnosis: Chronic right shoulder pain  Stiffness of right shoulder, not elsewhere classified     Problem List Patient Active Problem List   Diagnosis Date Noted  . Coronary artery disease due to  lipid rich plaque 03/18/2016  . Anxiety state 03/18/2016  . Vitamin D deficiency 03/18/2016  . 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 CAD (coronary artery disease) 12/30/2013  . 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 Osteopenia of the elderly 10/26/2013  . Generalized anxiety disorder 09/21/2013  . GERD (gastroesophageal reflux disease) 09/21/2013  . Pulmonary nodule seen on imaging study 09/21/2013  . SYNCOPE 02/02/2009  . Pre-diabetes 02/01/2009  . Hyperlipidemia 02/01/2009  . Essential hypertension 02/01/2009  . DIZZINESS 02/01/2009  . PALPITATIONS 02/01/2009    Standley Brooking, PTA 06/28/18 9:11 AM   [facility] [place], [place], [address] Phone: [phone]   Fax:  [phone]  Name: ALISA STJAMES MRN: [id number] Date of Birth: 12/06/1940    PHYSICAL THERAPY DISCHARGE SUMMARY  Visits from Start of Care: 26  Current functional level related to goals / functional outcomes: See above   Remaining deficits: Goals partially met; limited with ROM   Education / Equipment: HEP  Plan: Patient agrees to discharge.  Patient goals were partially met. Patient is being  discharged due to being pleased with the current functional level.  ?????

## 2018-06-29 ENCOUNTER — Ambulatory Visit (INDEPENDENT_AMBULATORY_CARE_PROVIDER_SITE_OTHER): Payer: Medicare Other | Admitting: *Deleted

## 2018-06-29 VITALS — BP 119/54 | HR 50 | Temp 97.0°F | Ht 60.0 in | Wt 171.0 lb

## 2018-06-29 DIAGNOSIS — Z0000 Encounter for general adult medical examination without abnormal findings: Secondary | ICD-10-CM

## 2018-06-29 NOTE — Patient Instructions (Signed)
  Ms. Pellegrin , Thank you for taking time to come for your Medicare Wellness Visit. I appreciate your ongoing commitment to your health goals. Please review the following plan we discussed and let me know if I can assist you in the future.   These are the goals we discussed: Goals    . Exercise 3x per week (30 min per time)     Do chair exercises at home daily. Check into exercise programs at the recreation department or [facility].     Marland Kitchen Prevent falls    . Weight (lb) < 160 lb (72.6 kg)       This is a list of the screening recommended for you and due dates:  Health Maintenance  Topic Date Due  . Mammogram  06/25/2018  . Pneumonia vaccines (2 of 2 - PPSV23) 09/15/2018*  . Pap Smear  12/04/2019*  . DEXA scan (bone density measurement)  10/13/2018  . Tetanus Vaccine  06/01/2021  . Flu Shot  Completed  *Topic was postponed. The date shown is not the original due date.   Keep follow up with DR Laurance Flatten and other specialist  Try not to fall

## 2018-06-29 NOTE — Progress Notes (Addendum)
Subjective:   Misty Smith is a 77 y.o. female who presents for Medicare Annual (Subsequent) preventive examination. She is retired from working at [employer]. She enjoys reading and crossword puzzles. For exercise, she walks 3 times a week for 15 min each time. She states that her diet is semi-healthy and she typically gets in 3 meals a day. She is active in church. She is now widowed. She has 4 children and 1 lives local. She does not have any pets and fall hazards were discussed today. She states that her health is slightly worse than it was a year ago.    Cardiac Risk Factors include: advanced age (>96men, >62 women);diabetes mellitus     Objective:     Vitals: BP (!) 119/54 (BP Location: Left Arm)   Pulse (!) 50   Temp (!) 97 F (36.1 C)   Ht 5' (1.524 m)   Wt 171 lb (77.6 kg)   BMI 33.40 kg/m   Body mass index is 33.4 kg/m.  Advanced Directives 06/29/2018 04/16/2018 04/29/2017 04/22/2016 03/16/2014 11/24/2013  Does Patient Have a Medical Advance Directive? Yes No Yes Yes Patient has advance directive, copy in chart Patient has advance directive, copy not in chart  Type of Advance Directive [place];Living will - [place];Living will [place];Living will Living will;Healthcare Power of Attorney Living will;Healthcare Power of Attorney  Does patient want to make changes to medical advance directive? No - Patient declined - No - Patient declined No - Patient declined - -  Copy of [place] in Chart? No - copy requested - No - copy requested No - copy requested - Copy requested from family  Would patient like information on creating a medical advance directive? - No - Patient declined - - - -  Pre-existing out of facility DNR order (yellow form or pink MOST form) - - - - No -    Tobacco Social History   Tobacco Use  Smoking Status Never Smoker  Smokeless Tobacco Never Used     Counseling  given: Not Answered   Past Medical History:  Diagnosis Date  . Allergy   . Anxiety   . Arthritis   . Bronchitis   . Cataract   . Colovaginal fistula   . Depression   . Diabetes mellitus without complication ([place])   . Dysrhythmia    palpitations, occasional PVCs; Dr Stanford Breed cardiologist  . Esophageal stricture   . Fractured elbow 1970's  . GERD (gastroesophageal reflux disease)   . H/O hiatal hernia   . Hyperlipidemia   . Hypertension   . Osteopenia   . Perimenopausal vasomotor symptoms   . Postmenopausal HRT (hormone replacement therapy)   . Pre-diabetes   . Stress   . Subacute lupus erythematosus    Dr. Allyson Sabal   Past Surgical History:  Procedure Laterality Date  . abdominal bypass  1987   fibroids   . ABDOMINAL HYSTERECTOMY     partial  . bilateral tubal ligtation  1972  . CHOLECYSTECTOMY N/A 03/21/2014   Procedure: LAPAROSCOPIC CHOLECYSTECTOMY WITH INTRAOPERATIVE CHOLANGIOGRAM;  Surgeon: Shann Medal, MD;  Location: [facility];  Service: General;  Laterality: N/A;  . COLONOSCOPY    . elbow Right 1976   surgery after fracture of elbow  . EYE SURGERY Bilateral    cataracts  . ROTATOR CUFF REPAIR Right 03/29/2018  . TONSILLECTOMY    . TUBAL LIGATION     Family History  Problem Relation Age of Onset  . Cancer Mother        laryngeal   . Hypertension Mother   . Heart disease Mother   . Hypertension Father   . Coronary artery disease Father   . Kidney disease Father        kidney failure   . Hypertension Sister   . Cancer Sister        breast  . Heart attack Sister   . Heart disease Sister   . Stroke Sister   . Stroke Sister   . Hypertension Brother   . Cancer Brother        liver  . Hypertension Sister   . Hyperlipidemia Sister   . Hypertension Brother   . Heart attack Brother   . Hypertension Son   . Hypertension Daughter    Social History   Socioeconomic History  . Marital status: Widowed    Spouse name: Not on file  . Number of children: 4    . Years of education: Not on file  . Highest education level: Not on file  Occupational History  . Occupation: retired     Comment: [employer]   Social Needs  . Financial resource strain: Not on file  . Food insecurity:    Worry: Not on file    Inability: Not on file  . Transportation needs:    Medical: Not on file    Non-medical: Not on file  Tobacco Use  . Smoking status: Never Smoker  . Smokeless tobacco: Never Used  Substance and Sexual Activity  . Alcohol use: No    Alcohol/week: 0.0 standard drinks  . Drug use: No  . Sexual activity: Never  Lifestyle  . Physical activity:    Days per week: Not on file    Minutes per session: Not on file  . Stress: Not on file  Relationships  . Social connections:    Talks on phone: Not on file    Gets together: Not on file    Attends religious service: Not on file    Active member of club or organization: Not on file    Attends meetings of clubs or organizations: Not on file    Relationship status: Not on file  Other Topics Concern  . Not on file  Social History Narrative  . Not on file    Outpatient Encounter Medications as of 06/29/2018  Medication Sig  . ALPRAZolam (XANAX) 0.25 MG tablet Take 1 tablet (0.25 mg total) by mouth at bedtime as needed for anxiety.  Marland Kitchen aspirin EC 81 MG tablet Take 1 tablet (81 mg total) by mouth daily.  Marland Kitchen atorvastatin (LIPITOR) 80 MG tablet TAKE AS DIRECTED  . Calcium Carb-Cholecalciferol (CALTRATE 600+D) 600-800 MG-UNIT TABS Take 1 tablet by mouth every evening.   . cholecalciferol (VITAMIN D) 1000 UNITS tablet Take 2,000 Units by mouth daily.  Marland Kitchen escitalopram (LEXAPRO) 20 MG tablet TAKE 1 TABLET (20 MG TOTAL) BY MOUTH DAILY.  Marland Kitchen esomeprazole (NEXIUM) 40 MG capsule TAKE 1 CAPSULE BY MOUTH EVERY DAY  . estradiol (ESTRACE VAGINAL) 0.1 MG/GM vaginal cream Place 2 g vaginally See admin instructions. Place 2 g vaginally 2-3 times weekly.  . hydrochlorothiazide (HYDRODIURIL) 12.5 MG tablet  Take 1 tablet (12.5 mg total) by mouth daily.  Marland Kitchen lisinopril (PRINIVIL,ZESTRIL) 30 MG tablet TAKE 1 TABLET (30 MG TOTAL) BY MOUTH DAILY.  . metoprolol tartrate (LOPRESSOR) 25 MG tablet Take 1 tablet (25 mg total) by mouth 2 (two) times  daily.  . Multiple Vitamin (MULTIVITAMIN) tablet Take 1 tablet by mouth every morning.   Marland Kitchen omega-3 acid ethyl esters (LOVAZA) 1 g capsule TAKE 2 CAPSULES BY MOUTH TWICE A DAY  . pioglitazone (ACTOS) 30 MG tablet TAKE 1 TABLET BY MOUTH EVERY DAY  . [DISCONTINUED] clobetasol (TEMOVATE) 0.05 % external solution Apply 1 application topically 2 (two) times daily.  . [DISCONTINUED] desonide (DESOWEN) 0.05 % cream Apply 1 application topically 2 (two) times daily as needed (for rash).   . [DISCONTINUED] ketorolac (ACULAR) 0.5 % ophthalmic solution PLACE 1 DROP INTO LEFT EYE 4 TIMES DAILY   No facility-administered encounter medications on file as of 06/29/2018.     Activities of Daily Living In your present state of health, do you have any difficulty performing the following activities: 06/29/2018  Hearing? N  Vision? Y  Comment Rx glasses  Difficulty concentrating or making decisions? N  Walking or climbing stairs? N  Dressing or bathing? N  Doing errands, shopping? N  Preparing Food and eating ? N  Using the Toilet? N  In the past six months, have you accidently leaked urine? N  Do you have problems with loss of bowel control? N  Managing your Medications? N  Managing your Finances? N  Housekeeping or managing your Housekeeping? N  Some recent data might be hidden    Patient Care Team: Chipper Herb, MD as PCP - General (Family Medicine) Clarene Essex, MD (Gastroenterology) Stanford Breed Denice Bors, MD (Cardiology) Irine Seal, MD (Urology) Paralee Cancel, MD (Orthopedic Surgery) Clent Jacks, MD as Consulting Physician (Ophthalmology) Netta Cedars, MD as Consulting Physician (Orthopedic Surgery)    Assessment:   This is a routine wellness examination for  Misty Smith.  Exercise Activities and Dietary recommendations Current Exercise Habits: Home exercise routine, Type of exercise: walking, Time (Minutes): 15, Frequency (Times/Week): 3, Weekly Exercise (Minutes/Week): 45, Intensity: Mild, Exercise limited by: None identified  Goals    . Exercise 3x per week (30 min per time)     Do chair exercises at home daily. Check into exercise programs at the recreation department or [facility].     Marland Kitchen Prevent falls    . Weight (lb) < 160 lb (72.6 kg)       Fall Risk Fall Risk  06/29/2018 05/19/2018 01/13/2018 10/30/2017 09/15/2017  Falls in the past year? No No No No No  Number falls in past yr: - - - - -  Comment - - - - -  Injury with Fall? - - - - -  Comment - - - - -  Risk for fall due to : - - - - -  Follow up - - - - -   Is the patient's home free of loose throw rugs in walkways, pet beds, electrical cords, etc?  Fall risks and hazards were discussed today   Depression Screen PHQ 2/9 Scores 06/29/2018 05/19/2018 01/13/2018 10/30/2017  PHQ - 2 Score 0 0 0 0     Cognitive Function MMSE - Mini Mental State Exam 06/29/2018 04/29/2017 04/22/2016  Orientation to time 5 5 5   Orientation to Place 5 5 5   Registration 3 3 3   Attention/ Calculation 5 5 5   Recall 1 3 3   Language- name 2 objects 2 2 2   Language- repeat 1 1 1   Language- follow 3 step command 3 3 3   Language- read & follow direction 1 1 1   Write a sentence 1 1 1   Copy design 1 1 1   Total score 28  30 30        Immunization History  Administered Date(s) Administered  . Influenza, High Dose Seasonal PF 06/22/2015, 05/26/2016, 06/04/2017, 06/15/2018  . Influenza,inj,Quad PF,6+ Mos 06/08/2013, 06/14/2014  . Pneumococcal Conjugate-13 09/21/2013    Qualifies for Shingles Vaccine? Declined   Screening Tests Health Maintenance  Topic Date Due  . MAMMOGRAM  06/25/2018  . PNA vac Low Risk Adult (2 of 2 - PPSV23) 09/15/2018 (Originally 09/21/2014)  . PAP SMEAR  12/04/2019 (Originally  11/13/2016)  . DEXA SCAN  10/13/2018  . TETANUS/TDAP  06/01/2021  . INFLUENZA VACCINE  Completed    Cancer Screenings: Lung: Low Dose CT Chest recommended if Age 44-80 years, 30 pack-year currently smoking OR have quit w/in 15years. Patient does not qualify. Breast:  Up to date on Mammogram? Yes   Up to date of Bone Density/Dexa? Yes Colorectal: done  Additional Screenings: declined  Hepatitis C Screening:      Plan:   pt is to keep follow up with Dr Laurance Flatten and other specialist.  She will try and prevent falls and increase exercise slowly.  I have personally reviewed and noted the following in the patient's chart:   . Medical and social history . Use of alcohol, tobacco or illicit drugs  . Current medications and supplements . Functional ability and status . Nutritional status . Physical activity . Advanced directives . List of other physicians . Hospitalizations, surgeries, and ER visits in previous 12 months . Vitals . Screenings to include cognitive, depression, and falls . Referrals and appointments  In addition, I have reviewed and discussed with patient certain preventive protocols, quality metrics, and best practice recommendations. A written personalized care plan for preventive services as well as general preventive health recommendations were provided to patient.     Telly Jawad, Cameron Proud, LPN  68/34/1962  I have reviewed and agree with the above AWV documentation.   Mary-Margaret Hassell Done, FNP

## 2018-07-14 DIAGNOSIS — Z9889 Other specified postprocedural states: Secondary | ICD-10-CM | POA: Diagnosis not present

## 2018-07-14 DIAGNOSIS — M75121 Complete rotator cuff tear or rupture of right shoulder, not specified as traumatic: Secondary | ICD-10-CM | POA: Diagnosis not present

## 2018-07-27 DIAGNOSIS — Z1231 Encounter for screening mammogram for malignant neoplasm of breast: Secondary | ICD-10-CM | POA: Diagnosis not present

## 2018-07-27 LAB — HM MAMMOGRAPHY

## 2018-08-05 ENCOUNTER — Other Ambulatory Visit: Payer: Self-pay | Admitting: *Deleted

## 2018-08-05 MED ORDER — METOPROLOL TARTRATE 25 MG PO TABS: 25.0000 mg | ORAL_TABLET | Freq: Two times a day (BID) | ORAL | 1 refills | Status: DC

## 2018-08-16 ENCOUNTER — Other Ambulatory Visit: Payer: Self-pay | Admitting: Family Medicine

## 2018-08-30 ENCOUNTER — Other Ambulatory Visit: Payer: Self-pay | Admitting: Family Medicine

## 2018-09-06 DIAGNOSIS — M9904 Segmental and somatic dysfunction of sacral region: Secondary | ICD-10-CM | POA: Diagnosis not present

## 2018-09-06 DIAGNOSIS — M9903 Segmental and somatic dysfunction of lumbar region: Secondary | ICD-10-CM | POA: Diagnosis not present

## 2018-09-06 DIAGNOSIS — M9901 Segmental and somatic dysfunction of cervical region: Secondary | ICD-10-CM | POA: Diagnosis not present

## 2018-09-06 DIAGNOSIS — M62830 Muscle spasm of back: Secondary | ICD-10-CM | POA: Diagnosis not present

## 2018-09-09 DIAGNOSIS — M9904 Segmental and somatic dysfunction of sacral region: Secondary | ICD-10-CM | POA: Diagnosis not present

## 2018-09-09 DIAGNOSIS — M9901 Segmental and somatic dysfunction of cervical region: Secondary | ICD-10-CM | POA: Diagnosis not present

## 2018-09-09 DIAGNOSIS — M62830 Muscle spasm of back: Secondary | ICD-10-CM | POA: Diagnosis not present

## 2018-09-09 DIAGNOSIS — M9903 Segmental and somatic dysfunction of lumbar region: Secondary | ICD-10-CM | POA: Diagnosis not present

## 2018-09-14 DIAGNOSIS — M9904 Segmental and somatic dysfunction of sacral region: Secondary | ICD-10-CM | POA: Diagnosis not present

## 2018-09-14 DIAGNOSIS — M62830 Muscle spasm of back: Secondary | ICD-10-CM | POA: Diagnosis not present

## 2018-09-14 DIAGNOSIS — M9901 Segmental and somatic dysfunction of cervical region: Secondary | ICD-10-CM | POA: Diagnosis not present

## 2018-09-14 DIAGNOSIS — M9903 Segmental and somatic dysfunction of lumbar region: Secondary | ICD-10-CM | POA: Diagnosis not present

## 2018-09-16 DIAGNOSIS — M9904 Segmental and somatic dysfunction of sacral region: Secondary | ICD-10-CM | POA: Diagnosis not present

## 2018-09-16 DIAGNOSIS — M9901 Segmental and somatic dysfunction of cervical region: Secondary | ICD-10-CM | POA: Diagnosis not present

## 2018-09-16 DIAGNOSIS — M62830 Muscle spasm of back: Secondary | ICD-10-CM | POA: Diagnosis not present

## 2018-09-16 DIAGNOSIS — M9903 Segmental and somatic dysfunction of lumbar region: Secondary | ICD-10-CM | POA: Diagnosis not present

## 2018-09-20 DIAGNOSIS — M62830 Muscle spasm of back: Secondary | ICD-10-CM | POA: Diagnosis not present

## 2018-09-20 DIAGNOSIS — M9901 Segmental and somatic dysfunction of cervical region: Secondary | ICD-10-CM | POA: Diagnosis not present

## 2018-09-20 DIAGNOSIS — M9904 Segmental and somatic dysfunction of sacral region: Secondary | ICD-10-CM | POA: Diagnosis not present

## 2018-09-20 DIAGNOSIS — M9903 Segmental and somatic dysfunction of lumbar region: Secondary | ICD-10-CM | POA: Diagnosis not present

## 2018-09-21 ENCOUNTER — Other Ambulatory Visit: Payer: Medicare Other

## 2018-09-21 DIAGNOSIS — R7303 Prediabetes: Secondary | ICD-10-CM | POA: Diagnosis not present

## 2018-09-21 DIAGNOSIS — I10 Essential (primary) hypertension: Secondary | ICD-10-CM

## 2018-09-21 DIAGNOSIS — E559 Vitamin D deficiency, unspecified: Secondary | ICD-10-CM | POA: Diagnosis not present

## 2018-09-21 DIAGNOSIS — E7849 Other hyperlipidemia: Secondary | ICD-10-CM

## 2018-09-22 LAB — HEPATIC FUNCTION PANEL
ALBUMIN: 3.9 g/dL (ref 3.7–4.7)
ALT: 28 IU/L (ref 0–32)
AST: 33 IU/L (ref 0–40)
Alkaline Phosphatase: 82 IU/L (ref 39–117)
BILIRUBIN, DIRECT: 0.1 mg/dL (ref 0.00–0.40)
Bilirubin Total: 0.4 mg/dL (ref 0.0–1.2)
TOTAL PROTEIN: 6.9 g/dL (ref 6.0–8.5)

## 2018-09-22 LAB — CBC WITH DIFFERENTIAL/PLATELET
BASOS: 1 %
Basophils Absolute: 0 10*3/uL (ref 0.0–0.2)
EOS (ABSOLUTE): 0.2 10*3/uL (ref 0.0–0.4)
EOS: 3 %
Hematocrit: 37.6 % (ref 34.0–46.6)
Hemoglobin: 12.4 g/dL (ref 11.1–15.9)
Immature Grans (Abs): 0 10*3/uL (ref 0.0–0.1)
Immature Granulocytes: 0 %
LYMPHS ABS: 1.9 10*3/uL (ref 0.7–3.1)
Lymphs: 40 %
MCH: 30.7 pg (ref 26.6–33.0)
MCHC: 33 g/dL (ref 31.5–35.7)
MCV: 93 fL (ref 79–97)
MONOS ABS: 0.6 10*3/uL (ref 0.1–0.9)
Monocytes: 13 %
NEUTROS ABS: 2 10*3/uL (ref 1.4–7.0)
Neutrophils: 43 %
PLATELETS: 243 10*3/uL (ref 150–450)
RBC: 4.04 x10E6/uL (ref 3.77–5.28)
RDW: 12.9 % (ref 11.7–15.4)
WBC: 4.6 10*3/uL (ref 3.4–10.8)

## 2018-09-22 LAB — BMP8+EGFR
BUN / CREAT RATIO: 19 (ref 12–28)
BUN: 14 mg/dL (ref 8–27)
CO2: 26 mmol/L (ref 20–29)
CREATININE: 0.74 mg/dL (ref 0.57–1.00)
Calcium: 9.7 mg/dL (ref 8.7–10.3)
Chloride: 96 mmol/L (ref 96–106)
GFR calc non Af Amer: 77 mL/min/{1.73_m2} (ref >59)
GFR, EST AFRICAN AMERICAN: 89 mL/min/{1.73_m2} (ref >59)
GLUCOSE: 99 mg/dL (ref 65–99)
Potassium: 4.9 mmol/L (ref 3.5–5.2)
SODIUM: 138 mmol/L (ref 134–144)

## 2018-09-22 LAB — LIPID PANEL
CHOL/HDL RATIO: 3.1 ratio (ref 0.0–4.4)
Cholesterol, Total: 149 mg/dL (ref 100–199)
HDL: 48 mg/dL (ref >39)
LDL Calculated: 73 mg/dL (ref 0–99)
Triglycerides: 140 mg/dL (ref 0–149)
VLDL Cholesterol Cal: 28 mg/dL (ref 5–40)

## 2018-09-22 LAB — VITAMIN D 25 HYDROXY (VIT D DEFICIENCY, FRACTURES): Vit D, 25-Hydroxy: 45.3 ng/mL (ref 30.0–100.0)

## 2018-09-23 DIAGNOSIS — M62830 Muscle spasm of back: Secondary | ICD-10-CM | POA: Diagnosis not present

## 2018-09-23 DIAGNOSIS — M9901 Segmental and somatic dysfunction of cervical region: Secondary | ICD-10-CM | POA: Diagnosis not present

## 2018-09-23 DIAGNOSIS — M9904 Segmental and somatic dysfunction of sacral region: Secondary | ICD-10-CM | POA: Diagnosis not present

## 2018-09-23 DIAGNOSIS — M9903 Segmental and somatic dysfunction of lumbar region: Secondary | ICD-10-CM | POA: Diagnosis not present

## 2018-09-27 DIAGNOSIS — M9903 Segmental and somatic dysfunction of lumbar region: Secondary | ICD-10-CM | POA: Diagnosis not present

## 2018-09-27 DIAGNOSIS — M62830 Muscle spasm of back: Secondary | ICD-10-CM | POA: Diagnosis not present

## 2018-09-27 DIAGNOSIS — M9904 Segmental and somatic dysfunction of sacral region: Secondary | ICD-10-CM | POA: Diagnosis not present

## 2018-09-27 DIAGNOSIS — M9901 Segmental and somatic dysfunction of cervical region: Secondary | ICD-10-CM | POA: Diagnosis not present

## 2018-09-28 ENCOUNTER — Ambulatory Visit (INDEPENDENT_AMBULATORY_CARE_PROVIDER_SITE_OTHER): Payer: Medicare Other | Admitting: Family Medicine

## 2018-09-28 ENCOUNTER — Ambulatory Visit (INDEPENDENT_AMBULATORY_CARE_PROVIDER_SITE_OTHER): Payer: Medicare Other

## 2018-09-28 ENCOUNTER — Encounter: Payer: Self-pay | Admitting: Family Medicine

## 2018-09-28 VITALS — BP 140/56 | HR 52 | Temp 97.0°F | Ht 60.0 in | Wt 170.0 lb

## 2018-09-28 DIAGNOSIS — Z13820 Encounter for screening for osteoporosis: Secondary | ICD-10-CM

## 2018-09-28 DIAGNOSIS — E559 Vitamin D deficiency, unspecified: Secondary | ICD-10-CM | POA: Diagnosis not present

## 2018-09-28 DIAGNOSIS — E7849 Other hyperlipidemia: Secondary | ICD-10-CM

## 2018-09-28 DIAGNOSIS — Z780 Asymptomatic menopausal state: Secondary | ICD-10-CM

## 2018-09-28 DIAGNOSIS — R7303 Prediabetes: Secondary | ICD-10-CM

## 2018-09-28 DIAGNOSIS — I10 Essential (primary) hypertension: Secondary | ICD-10-CM

## 2018-09-28 DIAGNOSIS — M85852 Other specified disorders of bone density and structure, left thigh: Secondary | ICD-10-CM | POA: Diagnosis not present

## 2018-09-28 NOTE — Progress Notes (Signed)
Subjective:    Patient ID: Misty Smith, female    DOB: September 05, 1940, 78 y.o.   MRN: [id number]  HPI Pt here for follow up and management of chronic medical problems which includes hypertension and hyperlipidemia. She is taking medication regularly.  The patient is doing well overall she does complain of some falling episodes.  She will get a DEXA scan today will be given an FOBT to return and has had lab work done which we will review with her during the visit today.  Cholesterol numbers with traditional lipid testing were all excellent and at goal with triglycerides being good at 140 the good cholesterol being good at 48 and the LDL-C or bad cholesterol being close to goal at 73.  The blood sugar was good at 99 the creatinine, the most important kidney function test was good at 0.74 and all of the electrolytes including potassium were normal.  The CBC had a normal white blood cell count.  The hemoglobin was stable at 12.4 and the platelet count was adequate.  All liver function tests were within normal limits.  The vitamin D level was good at 45.3.  The patient has prediabetes.  She also has documented coronary artery disease hyperlipidemia vitamin D deficiency and hypertension.  He has GERD.  The patient is pleasant and smiling and doing well overall.  She denies any chest pain.  She denies no more shortness of breath than expected.  She does describe some swallowing at times and noted noted that bread was 1 of the things that she has trouble swallowing but it is not getting any worse or getting any more frequent.  She has had to have an endoscopy and dilatation in the past and will get back in touch with [place] if this progresses.  She denies any blood in the stool or black tarry bowel movements.  She denies any change in bowel habits.  She is trying to drink plenty of water and says she is passing her water well and without problems.  Her eye exams are up-to-date and she does have macular degeneration.  She  is followed by the cardiologist, Dr. Stanford Breed and he will see her sometime later this spring.    Patient Active Problem List   Diagnosis Date Noted  . Coronary artery disease due to lipid rich plaque 03/18/2016  . Anxiety state 03/18/2016  . Vitamin D deficiency 03/18/2016  . 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 CAD (coronary artery disease) 12/30/2013  . 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 Osteopenia of the elderly 10/26/2013  . Generalized anxiety disorder 09/21/2013  . GERD (gastroesophageal reflux disease) 09/21/2013  . Pulmonary nodule seen on imaging study 09/21/2013  . SYNCOPE 02/02/2009  . Pre-diabetes 02/01/2009  . Hyperlipidemia 02/01/2009  . Essential hypertension 02/01/2009  . DIZZINESS 02/01/2009  . PALPITATIONS 02/01/2009   Outpatient Encounter Medications as of 09/28/2018  Medication Sig  . ALPRAZolam (XANAX) 0.25 MG tablet Take 1 tablet (0.25 mg total) by mouth at bedtime as needed for anxiety.  Marland Kitchen aspirin EC 81 MG tablet Take 1 tablet (81 mg total) by mouth daily.  Marland Kitchen atorvastatin (LIPITOR) 80 MG tablet TAKE AS DIRECTED  . Calcium Carb-Cholecalciferol (CALTRATE 600+D) 600-800 MG-UNIT TABS Take 1 tablet by mouth every evening.   . cholecalciferol (VITAMIN D) 1000 UNITS tablet Take 2,000 Units by mouth daily.  Marland Kitchen escitalopram (LEXAPRO) 20 MG tablet TAKE 1 TABLET (20 MG TOTAL) BY MOUTH DAILY.  Marland Kitchen esomeprazole (NEXIUM) 40 MG capsule TAKE 1  CAPSULE BY MOUTH EVERY DAY  . hydrochlorothiazide (HYDRODIURIL) 12.5 MG tablet Take 1 tablet (12.5 mg total) by mouth daily.  Marland Kitchen lisinopril (PRINIVIL,ZESTRIL) 30 MG tablet TAKE 1 TABLET (30 MG TOTAL) BY MOUTH DAILY.  . metoprolol tartrate (LOPRESSOR) 25 MG tablet Take 1 tablet (25 mg total) by mouth 2 (two) times daily.  . Multiple Vitamin (MULTIVITAMIN) tablet Take 1 tablet by mouth every morning.   Marland Kitchen omega-3 acid ethyl esters (LOVAZA) 1 g capsule TAKE 2 CAPSULES BY MOUTH TWICE A DAY  . pioglitazone (ACTOS) 30 MG tablet TAKE 1  TABLET BY MOUTH EVERY DAY  . [DISCONTINUED] estradiol (ESTRACE VAGINAL) 0.1 MG/GM vaginal cream Place 2 g vaginally See admin instructions. Place 2 g vaginally 2-3 times weekly.   No facility-administered encounter medications on file as of 09/28/2018.      Review of Systems  Constitutional: Negative.   HENT: Negative.   Eyes: Negative.   Respiratory: Negative.   Cardiovascular: Negative.   Gastrointestinal: Negative.   Endocrine: Negative.   Genitourinary: Negative.   Musculoskeletal: Negative.   Skin: Negative.   Allergic/Immunologic: Negative.   Neurological: Negative.   Hematological: Negative.   Psychiatric/Behavioral: Negative.        Objective:   Physical Exam Vitals signs and nursing note reviewed.  Constitutional:      General: She is not in acute distress.    Appearance: Normal appearance. She is well-developed. She is obese. She is not ill-appearing.     Comments: Is quiet spoken but pleasant and alert and adjusting to living by herself following the death of her husband over a couple of years ago.  She worries about falling and she knows that she needs to be more careful with her movements and to avoid climbing.  HENT:     Head: Normocephalic and atraumatic.     Right Ear: Tympanic membrane, ear canal and external ear normal. There is no impacted cerumen.     Left Ear: Tympanic membrane, ear canal and external ear normal. There is no impacted cerumen.     Nose: Nose normal. No congestion.     Mouth/Throat:     Mouth: Mucous membranes are moist.     Pharynx: Oropharynx is clear. No oropharyngeal exudate or posterior oropharyngeal erythema.  Eyes:     General: No scleral icterus.       Right eye: No discharge.        Left eye: No discharge.     Extraocular Movements: Extraocular movements intact.     Conjunctiva/sclera: Conjunctivae normal.     Pupils: Pupils are equal, round, and reactive to light.     Comments: Patient has macular degeneration.  He is followed  regularly by the ophthalmologist.  Neck:     Musculoskeletal: Normal range of motion and neck supple.     Thyroid: No thyromegaly.     Vascular: No carotid bruit or JVD.     Comments: No bruits thyromegaly or anterior cervical adenopathy Cardiovascular:     Rate and Rhythm: Normal rate and regular rhythm.     Pulses: Normal pulses.     Heart sounds: Normal heart sounds. No murmur. No gallop.      Comments: The heart is regular today at 60/min , there is no edema good pedal pulses. Pulmonary:     Effort: Pulmonary effort is normal.     Breath sounds: Normal breath sounds. No wheezing or rales.     Comments: Lungs are clear anteriorly and posteriorly Abdominal:  General: Abdomen is flat. Bowel sounds are normal.     Palpations: Abdomen is soft. There is no mass.     Tenderness: There is abdominal tenderness. There is no guarding.     Comments: Slight epigastric and supra pubic tenderness  Musculoskeletal: Normal range of motion.        General: Signs of injury present. No tenderness.     Right lower leg: No edema.     Left lower leg: No edema.     Comments: Good range of motion and no bruising  Lymphadenopathy:     Cervical: No cervical adenopathy.  Skin:    General: Skin is warm and dry.     Findings: No rash.  Neurological:     General: No focal deficit present.     Mental Status: She is alert and oriented to person, place, and time. Mental status is at baseline.     Cranial Nerves: No cranial nerve deficit.     Gait: Gait normal.     Deep Tendon Reflexes: Reflexes are normal and symmetric. Reflexes normal.  Psychiatric:        Mood and Affect: Mood normal.        Behavior: Behavior normal.        Thought Content: Thought content normal.        Judgment: Judgment normal.     BP (!) 140/56 (BP Location: Left Arm)   Pulse (!) 52   Temp (!) 97 F (36.1 C) (Oral)   Ht 5' (1.524 m)   Wt 170 lb (77.1 kg)   BMI 33.20 kg/m        Assessment & Plan:  1. Essential  hypertension -Continue current treatment  2. Hyperlipidemia -cont current treatment and TLC  3. Vitamin D deficiency - DG [facility] DEXA; Future  4. Pre-diabetes -BS is good Continue TLC  5. Screening for osteoporosis - DG [facility] DEXA; Future  6. Post-menopausal - DG [facility] DEXA; Future  Patient Instructions                       Medicare Annual Wellness Visit  [facility] and the medical providers at [facility] strive to bring you the best medical care.  In doing so we not only want to address your current medical conditions and concerns but also to detect new conditions early and prevent illness, disease and health-related problems.    Medicare offers a yearly Wellness Visit which allows our clinical staff to assess your need for preventative services including immunizations, lifestyle education, counseling to decrease risk of preventable diseases and screening for fall risk and other medical concerns.    This visit is provided free of charge (no copay) for all Medicare recipients. The clinical pharmacists at [facility] have begun to conduct these Wellness Visits which will also include a thorough review of all your medications.    As you primary medical provider recommend that you make an appointment for your Annual Wellness Visit if you have not done so already this year.  You may set up this appointment before you leave today or you may call back ([phone]) and schedule an appointment.  Please make sure when you call that you mention that you are scheduling your Annual Wellness Visit with the clinical pharmacist so that the appointment may be made for the proper length of time.    Continue current medications. Continue good therapeutic lifestyle changes which include good diet and exercise. Fall  precautions discussed with patient. If an FOBT was given today- please return it to our front desk. If you are over 54 years old - you may need  Prevnar 20 or the adult Pneumonia vaccine.  **Flu shots are available--- please call and schedule a FLU-CLINIC appointment**  After your visit with [place] today you will receive a survey in the mail or online from [employer] regarding your care with [place]. Please take a moment to fill this out. Your feedback is very important to [place] as you can help [place] better understand your patient needs as well as improve your experience and satisfaction. WE CARE ABOUT YOU!!!  Continue to drink plenty of water and stay well-hydrated Continue to be careful and not put self at risk for falling and move slowly and visually look at where you are moving so you will not trip over anything to cause you to fall Until the weather warms up the walking should be done inside at places like [employer] or [employer].  She should try to walk regularly. For the rest of the winter practice good hand and respiratory hygiene.    Arrie Senate MD

## 2018-09-28 NOTE — Patient Instructions (Addendum)
Medicare Annual Wellness Visit  [facility] and the medical providers at [facility] strive to bring you the best medical care.  In doing so we not only want to address your current medical conditions and concerns but also to detect new conditions early and prevent illness, disease and health-related problems.    Medicare offers a yearly Wellness Visit which allows our clinical staff to assess your need for preventative services including immunizations, lifestyle education, counseling to decrease risk of preventable diseases and screening for fall risk and other medical concerns.    This visit is provided free of charge (no copay) for all Medicare recipients. The clinical pharmacists at [facility] have begun to conduct these Wellness Visits which will also include a thorough review of all your medications.    As you primary medical provider recommend that you make an appointment for your Annual Wellness Visit if you have not done so already this year.  You may set up this appointment before you leave today or you may call back ([phone]) and schedule an appointment.  Please make sure when you call that you mention that you are scheduling your Annual Wellness Visit with the clinical pharmacist so that the appointment may be made for the proper length of time.    Continue current medications. Continue good therapeutic lifestyle changes which include good diet and exercise. Fall precautions discussed with patient. If an FOBT was given today- please return it to our front desk. If you are over 44 years old - you may need Prevnar 18 or the adult Pneumonia vaccine.  **Flu shots are available--- please call and schedule a FLU-CLINIC appointment**  After your visit with [place] today you will receive a survey in the mail or online from [employer] regarding your care with [place]. Please take a moment to fill this out. Your feedback is very  important to [place] as you can help [place] better understand your patient needs as well as improve your experience and satisfaction. WE CARE ABOUT YOU!!!  Continue to drink plenty of water and stay well-hydrated Continue to be careful and not put self at risk for falling and move slowly and visually look at where you are moving so you will not trip over anything to cause you to fall Until the weather warms up the walking should be done inside at places like [employer] or [employer].  She should try to walk regularly. For the rest of the winter practice good hand and respiratory hygiene.

## 2018-09-30 DIAGNOSIS — M9904 Segmental and somatic dysfunction of sacral region: Secondary | ICD-10-CM | POA: Diagnosis not present

## 2018-09-30 DIAGNOSIS — M9903 Segmental and somatic dysfunction of lumbar region: Secondary | ICD-10-CM | POA: Diagnosis not present

## 2018-09-30 DIAGNOSIS — M9901 Segmental and somatic dysfunction of cervical region: Secondary | ICD-10-CM | POA: Diagnosis not present

## 2018-09-30 DIAGNOSIS — M62830 Muscle spasm of back: Secondary | ICD-10-CM | POA: Diagnosis not present

## 2018-10-01 ENCOUNTER — Other Ambulatory Visit: Payer: Medicare Other

## 2018-10-01 DIAGNOSIS — Z1211 Encounter for screening for malignant neoplasm of colon: Secondary | ICD-10-CM

## 2018-10-01 NOTE — Addendum Note (Signed)
Addended by: Liliane Bade on: 10/01/2018 01:12 PM   Modules accepted: Orders

## 2018-10-04 DIAGNOSIS — M62830 Muscle spasm of back: Secondary | ICD-10-CM | POA: Diagnosis not present

## 2018-10-04 DIAGNOSIS — M9903 Segmental and somatic dysfunction of lumbar region: Secondary | ICD-10-CM | POA: Diagnosis not present

## 2018-10-04 DIAGNOSIS — M9901 Segmental and somatic dysfunction of cervical region: Secondary | ICD-10-CM | POA: Diagnosis not present

## 2018-10-04 DIAGNOSIS — M9904 Segmental and somatic dysfunction of sacral region: Secondary | ICD-10-CM | POA: Diagnosis not present

## 2018-10-04 LAB — FECAL OCCULT BLOOD, IMMUNOCHEMICAL: Fecal Occult Bld: NEGATIVE

## 2018-10-07 DIAGNOSIS — M9903 Segmental and somatic dysfunction of lumbar region: Secondary | ICD-10-CM | POA: Diagnosis not present

## 2018-10-07 DIAGNOSIS — M9901 Segmental and somatic dysfunction of cervical region: Secondary | ICD-10-CM | POA: Diagnosis not present

## 2018-10-07 DIAGNOSIS — M9904 Segmental and somatic dysfunction of sacral region: Secondary | ICD-10-CM | POA: Diagnosis not present

## 2018-10-07 DIAGNOSIS — M62830 Muscle spasm of back: Secondary | ICD-10-CM | POA: Diagnosis not present

## 2018-10-11 DIAGNOSIS — M62830 Muscle spasm of back: Secondary | ICD-10-CM | POA: Diagnosis not present

## 2018-10-11 DIAGNOSIS — M9903 Segmental and somatic dysfunction of lumbar region: Secondary | ICD-10-CM | POA: Diagnosis not present

## 2018-10-11 DIAGNOSIS — M9904 Segmental and somatic dysfunction of sacral region: Secondary | ICD-10-CM | POA: Diagnosis not present

## 2018-10-11 DIAGNOSIS — M9901 Segmental and somatic dysfunction of cervical region: Secondary | ICD-10-CM | POA: Diagnosis not present

## 2018-10-14 DIAGNOSIS — M9903 Segmental and somatic dysfunction of lumbar region: Secondary | ICD-10-CM | POA: Diagnosis not present

## 2018-10-14 DIAGNOSIS — M9904 Segmental and somatic dysfunction of sacral region: Secondary | ICD-10-CM | POA: Diagnosis not present

## 2018-10-14 DIAGNOSIS — M9901 Segmental and somatic dysfunction of cervical region: Secondary | ICD-10-CM | POA: Diagnosis not present

## 2018-10-14 DIAGNOSIS — M62830 Muscle spasm of back: Secondary | ICD-10-CM | POA: Diagnosis not present

## 2018-10-18 DIAGNOSIS — M9901 Segmental and somatic dysfunction of cervical region: Secondary | ICD-10-CM | POA: Diagnosis not present

## 2018-10-18 DIAGNOSIS — M62830 Muscle spasm of back: Secondary | ICD-10-CM | POA: Diagnosis not present

## 2018-10-18 DIAGNOSIS — M9903 Segmental and somatic dysfunction of lumbar region: Secondary | ICD-10-CM | POA: Diagnosis not present

## 2018-10-18 DIAGNOSIS — M9904 Segmental and somatic dysfunction of sacral region: Secondary | ICD-10-CM | POA: Diagnosis not present

## 2018-10-25 DIAGNOSIS — M9903 Segmental and somatic dysfunction of lumbar region: Secondary | ICD-10-CM | POA: Diagnosis not present

## 2018-10-25 DIAGNOSIS — M9904 Segmental and somatic dysfunction of sacral region: Secondary | ICD-10-CM | POA: Diagnosis not present

## 2018-10-25 DIAGNOSIS — M62830 Muscle spasm of back: Secondary | ICD-10-CM | POA: Diagnosis not present

## 2018-10-25 DIAGNOSIS — M9901 Segmental and somatic dysfunction of cervical region: Secondary | ICD-10-CM | POA: Diagnosis not present

## 2018-11-01 DIAGNOSIS — M9901 Segmental and somatic dysfunction of cervical region: Secondary | ICD-10-CM | POA: Diagnosis not present

## 2018-11-01 DIAGNOSIS — M9903 Segmental and somatic dysfunction of lumbar region: Secondary | ICD-10-CM | POA: Diagnosis not present

## 2018-11-01 DIAGNOSIS — M62830 Muscle spasm of back: Secondary | ICD-10-CM | POA: Diagnosis not present

## 2018-11-01 DIAGNOSIS — M9904 Segmental and somatic dysfunction of sacral region: Secondary | ICD-10-CM | POA: Diagnosis not present

## 2018-11-08 DIAGNOSIS — M9903 Segmental and somatic dysfunction of lumbar region: Secondary | ICD-10-CM | POA: Diagnosis not present

## 2018-11-08 DIAGNOSIS — M9901 Segmental and somatic dysfunction of cervical region: Secondary | ICD-10-CM | POA: Diagnosis not present

## 2018-11-08 DIAGNOSIS — M9904 Segmental and somatic dysfunction of sacral region: Secondary | ICD-10-CM | POA: Diagnosis not present

## 2018-11-08 DIAGNOSIS — M62830 Muscle spasm of back: Secondary | ICD-10-CM | POA: Diagnosis not present

## 2018-11-09 ENCOUNTER — Other Ambulatory Visit: Payer: Self-pay | Admitting: Family Medicine

## 2018-11-15 ENCOUNTER — Other Ambulatory Visit: Payer: Self-pay | Admitting: Family Medicine

## 2018-11-15 DIAGNOSIS — M9904 Segmental and somatic dysfunction of sacral region: Secondary | ICD-10-CM | POA: Diagnosis not present

## 2018-11-15 DIAGNOSIS — M9903 Segmental and somatic dysfunction of lumbar region: Secondary | ICD-10-CM | POA: Diagnosis not present

## 2018-11-15 DIAGNOSIS — M62830 Muscle spasm of back: Secondary | ICD-10-CM | POA: Diagnosis not present

## 2018-11-15 DIAGNOSIS — M9901 Segmental and somatic dysfunction of cervical region: Secondary | ICD-10-CM | POA: Diagnosis not present

## 2018-11-22 ENCOUNTER — Other Ambulatory Visit: Payer: Self-pay | Admitting: Family Medicine

## 2018-12-01 ENCOUNTER — Other Ambulatory Visit: Payer: Self-pay

## 2018-12-01 ENCOUNTER — Ambulatory Visit (INDEPENDENT_AMBULATORY_CARE_PROVIDER_SITE_OTHER): Payer: Medicare Other | Admitting: Family Medicine

## 2018-12-01 DIAGNOSIS — K1379 Other lesions of oral mucosa: Secondary | ICD-10-CM

## 2018-12-01 MED ORDER — FIRST-DUKES MOUTHWASH MT SUSP: OROMUCOSAL | 0 refills | Status: DC

## 2018-12-01 NOTE — Progress Notes (Signed)
Virtual Visit via telephone Note  I connected with Misty Smith on 12/01/18 at 3:20 pm by telephone and verified that I am speaking with the correct person using two identifiers. Misty Smith is currently located at home and no one is currently with her during visit. The provider, Redge Gainer, MD is located in their office at time of visit.  I discussed the limitations, risks, security and privacy concerns of performing an evaluation and management service by telephone and the availability of in person appointments. I also discussed with the patient that there may be a patient responsible charge related to this service. The patient expressed understanding and agreed to proceed.   History and Present Illness:  The patient complains of sores in her mouth especially underneath the upper lip on the right and the lower lip on the right that have been there off and on since January.  She describes them as sort of raised and red with a white area in the center.  She does not think that appears to be like an ulcer but based on her description I am not quite sure what this is and we do not have the visual of it.  She does take Nexium for her stomach.  Her medications have not changed.  Her allergies have not changed.  All of this was reviewed prior to discussing the irritated areas in her mouth.     She denies any chest pain.  She has ongoing shortness of breath which is no worse than usual.  She does have indigestion but the Nexium helps relieve this and there is been no change in her bowel habits.  She is passing her water without problems.  We also discussed that she should avoid any unnecessary visits in the next few weeks with any dentist or doctors unless they are of an emergent reason.   Observations/Objective: -The discussion was by telephone.  And the areas in the mouth have been irritating her for several weeks off and on.  More on than off.  She is willing to come in and let us look  at them if the medications that we try with a telephone visit do not work.  Assessment and Plan: Mouth sores  Meds ordered this encounter  Medications  . Diphenhyd-Hydrocort-Nystatin (FIRST-DUKES MOUTHWASH) SUSP    Sig: Swish and spit 5 ml QID, after meals and at bedtime    Dispense:  200 mL    Refill:  0     Follow Up Instructions: Use Dukes mixture 1 teaspoon after meals and at bedtime swish in mouth and expectorate Call us in 10 days to let us know how the mouth lesions are doing at that time.   Patient Instructions  Continue with Nexium Swish Dukes mixture of 1 teaspoon in mouth after meals and at bedtime and expectorate Call in 10 days for progress Check blood pressures at home more frequently Avoid unnecessary doctors visits over the next 4 to 6 weeks.     I discussed the assessment and treatment plan with the patient. The patient was provided an opportunity to ask questions and all were answered. The patient agreed with the plan and demonstrated an understanding of the instructions.   The patient was advised to call back or seek an in-person evaluation if the symptoms worsen or if the condition fails to improve as anticipated.  The above assessment and management plan was discussed with the patient. The patient verbalized understanding of and has agreed to the  management plan. Patient is aware to call the clinic if symptoms persist or worsen. Patient is aware when to return to the clinic for a follow-up visit. Patient educated on when it is appropriate to go to the emergency department.    I provided 20 minutes of non-face-to-face time during this encounter.    Redge Gainer, MD

## 2018-12-01 NOTE — Patient Instructions (Addendum)
Continue with Nexium Swish Dukes mixture of 1 teaspoon in mouth after meals and at bedtime and expectorate Call in 10 days for progress Check blood pressures at home more frequently Avoid unnecessary doctors visits over the next 4 to 6 weeks.

## 2018-12-16 ENCOUNTER — Other Ambulatory Visit: Payer: Self-pay | Admitting: Family Medicine

## 2019-01-03 DIAGNOSIS — M9904 Segmental and somatic dysfunction of sacral region: Secondary | ICD-10-CM | POA: Diagnosis not present

## 2019-01-03 DIAGNOSIS — M9901 Segmental and somatic dysfunction of cervical region: Secondary | ICD-10-CM | POA: Diagnosis not present

## 2019-01-03 DIAGNOSIS — M9903 Segmental and somatic dysfunction of lumbar region: Secondary | ICD-10-CM | POA: Diagnosis not present

## 2019-01-03 DIAGNOSIS — M62830 Muscle spasm of back: Secondary | ICD-10-CM | POA: Diagnosis not present

## 2019-01-17 DIAGNOSIS — D3132 Benign neoplasm of left choroid: Secondary | ICD-10-CM | POA: Diagnosis not present

## 2019-01-17 DIAGNOSIS — E119 Type 2 diabetes mellitus without complications: Secondary | ICD-10-CM | POA: Diagnosis not present

## 2019-01-17 DIAGNOSIS — Z961 Presence of intraocular lens: Secondary | ICD-10-CM | POA: Diagnosis not present

## 2019-01-18 DIAGNOSIS — M9901 Segmental and somatic dysfunction of cervical region: Secondary | ICD-10-CM | POA: Diagnosis not present

## 2019-01-18 DIAGNOSIS — M62830 Muscle spasm of back: Secondary | ICD-10-CM | POA: Diagnosis not present

## 2019-01-18 DIAGNOSIS — M9904 Segmental and somatic dysfunction of sacral region: Secondary | ICD-10-CM | POA: Diagnosis not present

## 2019-01-18 DIAGNOSIS — M9903 Segmental and somatic dysfunction of lumbar region: Secondary | ICD-10-CM | POA: Diagnosis not present

## 2019-01-25 ENCOUNTER — Other Ambulatory Visit: Payer: Self-pay | Admitting: Family Medicine

## 2019-01-26 ENCOUNTER — Other Ambulatory Visit: Payer: Self-pay | Admitting: Family Medicine

## 2019-01-31 ENCOUNTER — Ambulatory Visit (INDEPENDENT_AMBULATORY_CARE_PROVIDER_SITE_OTHER): Payer: Medicare Other | Admitting: Family Medicine

## 2019-01-31 ENCOUNTER — Other Ambulatory Visit: Payer: Self-pay

## 2019-01-31 ENCOUNTER — Encounter: Payer: Self-pay | Admitting: Family Medicine

## 2019-01-31 DIAGNOSIS — I10 Essential (primary) hypertension: Secondary | ICD-10-CM

## 2019-01-31 DIAGNOSIS — K1379 Other lesions of oral mucosa: Secondary | ICD-10-CM

## 2019-01-31 DIAGNOSIS — R7303 Prediabetes: Secondary | ICD-10-CM | POA: Diagnosis not present

## 2019-01-31 DIAGNOSIS — K219 Gastro-esophageal reflux disease without esophagitis: Secondary | ICD-10-CM

## 2019-01-31 DIAGNOSIS — Z780 Asymptomatic menopausal state: Secondary | ICD-10-CM | POA: Diagnosis not present

## 2019-01-31 DIAGNOSIS — F411 Generalized anxiety disorder: Secondary | ICD-10-CM

## 2019-01-31 DIAGNOSIS — E559 Vitamin D deficiency, unspecified: Secondary | ICD-10-CM

## 2019-01-31 DIAGNOSIS — Z1283 Encounter for screening for malignant neoplasm of skin: Secondary | ICD-10-CM | POA: Diagnosis not present

## 2019-01-31 DIAGNOSIS — E782 Mixed hyperlipidemia: Secondary | ICD-10-CM | POA: Diagnosis not present

## 2019-01-31 DIAGNOSIS — L932 Other local lupus erythematosus: Secondary | ICD-10-CM

## 2019-01-31 NOTE — Patient Instructions (Signed)
Patient should continue to practice good hand and respiratory hygiene She should follow-up with her visit with Dr. Percival Spanish as planned We will try to get her an appointment with the dermatologist in July for a good screen check and have them to look at her upper lip at that time. She will continue to drink plenty of water and fluids and stay well-hydrated She will make every effort to keep from falling and avoid climbing. She plans to resume her walking as I told her this would be good for her mental health as well as her bone strength. She will come to the office and work this time out with the nurse for routine lab work

## 2019-01-31 NOTE — Addendum Note (Signed)
Addended by: Zannie Cove on: 01/31/2019 01:50 PM   Modules accepted: Orders

## 2019-01-31 NOTE — Progress Notes (Signed)
Virtual Visit Via telephone Note I connected with@ on 01/31/19 by telephone and verified that I am speaking with the correct person or authorized healthcare agent using two identifiers. Misty Smith is currently located at home and there are no unauthorized people in close proximity. I completed this visit while in a private location in my home .  This visit type was conducted due to national recommendations for restrictions regarding the COVID-19 Pandemic (e.g. social distancing).  This format is felt to be most appropriate for this patient at this time.  All issues noted in this document were discussed and addressed.  No physical exam was performed.    I discussed the limitations, risks, security and privacy concerns of performing an evaluation and management service by telephone and the availability of in person appointments. I also discussed with the patient that there may be a patient responsible charge related to this service. The patient expressed understanding and agreed to proceed.   Date:  01/31/2019    ID:  Thomes [address]      1940-12-16        [id number]   Patient Care Team Patient Care Team: Chipper Herb, MD as PCP - General (Family Medicine) Clarene Essex, MD (Gastroenterology) Stanford Breed Denice Bors, MD (Cardiology) Irine Seal, MD (Urology) Paralee Cancel, MD (Orthopedic Surgery) Clent Jacks, MD as Consulting Physician (Ophthalmology) Netta Cedars, MD as Consulting Physician (Orthopedic Surgery)  Reason for Visit: Primary Care Follow-up     History of Present Illness & Review of Systems:     Misty Smith is a 78 y.o. year old female primary care patient that presents today for a telehealth visit.  The patient is alert pleasant and doing well.  She denies any chest pain.  She does have some shortness of breath with exertion and when it is very humid and she attributes this to not being as active physically.  She also is having some ongoing problems with heartburn  despite taking Nexium once a day and we went ahead and suggested that she take the Pepcid [facility] before supper and the Nexium before breakfast in the morning.  She will discuss with Dr. Percival Spanish about the need or not for taking her coated baby aspirin.  She has not seen any blood in the stool had any change in bowel habits.  She is passing her water well although having frequency.  She is scheduled to see a dermatologist in July and we will hopefully try to get her in to see 1 at that time.  Review of systems as stated, otherwise negative.  The patient does not have symptoms concerning for COVID-19 infection (fever, chills, cough, or new shortness of breath).      Current Medications (Verified) Allergies as of 01/31/2019      Reactions   Ciprofloxacin Rash   Codeine Rash   Iohexol     Desc: PT HAD HIVES AND WAS GIVEN BENEDRYL PO BY NURSE AND OBSERVED IN NURSES STATION AND RELEASED WITHOUT AND OTHER COMPLICATIONS SHEET SCANNED UNDER NURSES NOTES///ON 12/01/11 PT HAD ANOTHER CT WITH PREMEDS MINUS THE BENADRYL. SHE HAD A DELAYED REACTION AFTER WITH THROAT TIGHTNESS AND SOB, JB  12/07/12   Penicillins Hives   Sulfonamide Derivatives Hives   Asa [aspirin] Other (See Comments)   Large amounts causes stomach pain   Keflex [cephalexin] Nausea And Vomiting   Morphine And Related Rash      Medication List       Accurate as  of January 31, 2019  8:58 AM. If you have any questions, ask your nurse or doctor.        ALPRAZolam 0.25 MG tablet Commonly known as:  XANAX Take 1 tablet (0.25 mg total) by mouth at bedtime as needed for anxiety.   aspirin EC 81 MG tablet Take 1 tablet (81 mg total) by mouth daily.   atorvastatin 80 MG tablet Commonly known as:  LIPITOR TAKE AS DIRECTED What changed:    how much to take  when to take this   Caltrate 600+D 600-800 MG-UNIT Tabs Generic drug:  Calcium Carb-Cholecalciferol Take 1 tablet by mouth every evening.   cholecalciferol 1000 units tablet Commonly  known as:  VITAMIN D Take 2,000 Units by mouth daily.   escitalopram 20 MG tablet Commonly known as:  LEXAPRO TAKE 1 TABLET (20 MG TOTAL) BY MOUTH DAILY.   esomeprazole 40 MG capsule Commonly known as:  NEXIUM TAKE 1 CAPSULE BY MOUTH EVERY DAY   First-Dukes Mouthwash Susp Swish and spit 5 ml QID, after meals and at bedtime   hydrochlorothiazide 12.5 MG tablet Commonly known as:  HYDRODIURIL TAKE 1 TABLET BY MOUTH EVERY DAY   lisinopril 30 MG tablet Commonly known as:  ZESTRIL TAKE 1 TABLET BY MOUTH EVERY DAY   metoprolol tartrate 25 MG tablet Commonly known as:  LOPRESSOR TAKE 1 TABLET BY MOUTH TWICE A DAY   multivitamin tablet Take 1 tablet by mouth every morning.   omega-3 acid ethyl esters 1 g capsule Commonly known as:  LOVAZA TAKE 2 CAPSULES BY MOUTH TWICE A DAY   pioglitazone 30 MG tablet Commonly known as:  ACTOS TAKE 1 TABLET BY MOUTH EVERY DAY           Allergies (Verified)    Ciprofloxacin; Codeine; Iohexol; Penicillins; Sulfonamide derivatives; Asa [aspirin]; Keflex [cephalexin]; and Morphine and related  Past Medical History Past Medical History:  Diagnosis Date  . Allergy   . Anxiety   . Arthritis   . Bronchitis   . Cataract   . Colovaginal fistula   . Depression   . Diabetes mellitus without complication ([place])   . Dysrhythmia    palpitations, occasional PVCs; Dr Stanford Breed cardiologist  . Esophageal stricture   . Fractured elbow 1970's  . GERD (gastroesophageal reflux disease)   . H/O hiatal hernia   . Hyperlipidemia   . Hypertension   . Osteopenia   . Perimenopausal vasomotor symptoms   . Postmenopausal HRT (hormone replacement therapy)   . Pre-diabetes   . Stress   . Subacute lupus erythematosus    Dr. Allyson Sabal     Past Surgical History:  Procedure Laterality Date  . abdominal bypass  1987   fibroids   . ABDOMINAL HYSTERECTOMY     partial  . bilateral tubal ligtation  1972  . CHOLECYSTECTOMY N/A 03/21/2014   Procedure:  LAPAROSCOPIC CHOLECYSTECTOMY WITH INTRAOPERATIVE CHOLANGIOGRAM;  Surgeon: Shann Medal, MD;  Location: [facility];  Service: General;  Laterality: N/A;  . COLONOSCOPY    . elbow Right 1976   surgery after fracture of elbow  . EYE SURGERY Bilateral    cataracts  . ROTATOR CUFF REPAIR Right 03/29/2018  . TONSILLECTOMY    . TUBAL LIGATION      Social History   Socioeconomic History  . Marital status: Widowed    Spouse name: Not on file  . Number of children: 4  . Years of education: Not on file  . Highest education level: Not on file  Occupational History  . Occupation: retired     Comment: [employer]   Social Needs  . Financial resource strain: Not on file  . Food insecurity:    Worry: Not on file    Inability: Not on file  . Transportation needs:    Medical: Not on file    Non-medical: Not on file  Tobacco Use  . Smoking status: Never Smoker  . Smokeless tobacco: Never Used  Substance and Sexual Activity  . Alcohol use: No    Alcohol/week: 0.0 standard drinks  . Drug use: No  . Sexual activity: Never  Lifestyle  . Physical activity:    Days per week: Not on file    Minutes per session: Not on file  . Stress: Not on file  Relationships  . Social connections:    Talks on phone: Not on file    Gets together: Not on file    Attends religious service: Not on file    Active member of club or organization: Not on file    Attends meetings of clubs or organizations: Not on file    Relationship status: Not on file  Other Topics Concern  . Not on file  Social History Narrative  . Not on file     Family History  Problem Relation Age of Onset  . Cancer Mother        laryngeal   . Hypertension Mother   . Heart disease Mother   . Hypertension Father   . Coronary artery disease Father   . Kidney disease Father        kidney failure   . Hypertension Sister   . Cancer Sister        breast  . Heart attack Sister   . Heart disease Sister   . Stroke Sister    . Stroke Sister   . Hypertension Brother   . Cancer Brother        liver  . Hypertension Sister   . Hyperlipidemia Sister   . Hypertension Brother   . Heart attack Brother   . Hypertension Son   . Hypertension Daughter       Labs/Other Tests and Data Reviewed:    Wt Readings from Last 3 Encounters:  09/28/18 170 lb (77.1 kg)  06/29/18 171 lb (77.6 kg)  05/19/18 172 lb (78 kg)   Temp Readings from Last 3 Encounters:  09/28/18 (!) 97 F (36.1 C) (Oral)  06/29/18 (!) 97 F (36.1 C)  05/19/18 (!) 96.8 F (36 C) (Oral)   BP Readings from Last 3 Encounters:  09/28/18 (!) 140/56  06/29/18 (!) 119/54  05/19/18 135/68   Pulse Readings from Last 3 Encounters:  09/28/18 (!) 52  06/29/18 (!) 50  05/19/18 (!) 50     Lab Results  Component Value Date   HGBA1C 6.0 07/15/2016   HGBA1C 6.4 03/11/2016   HGBA1C 6.0% 09/27/2014   Lab Results  Component Value Date   MICROALBUR negative 01/05/2014   LDLCALC 73 09/21/2018   CREATININE 0.74 09/21/2018       Chemistry      Component Value Date/Time   NA 138 09/21/2018 0832   K 4.9 09/21/2018 0832   CL 96 09/21/2018 0832   CO2 26 09/21/2018 0832   BUN 14 09/21/2018 0832   CREATININE 0.74 09/21/2018 0832   CREATININE 0.75 11/17/2012 1036      Component Value Date/Time   CALCIUM 9.7 09/21/2018 0832   ALKPHOS 82 09/21/2018 9485  AST 33 09/21/2018 0832   ALT 28 09/21/2018 0832   BILITOT 0.4 09/21/2018 8242         OBSERVATIONS/ OBJECTIVE:     Patient is alert and cooperative on the phone.  She does not have any blood pressure readings to report but will start checking her blood pressure more regularly and checking her heart rate with her monitor.  She does have an upcoming visit with Dr. Percival Spanish, the cardiologist soon and she will try to be prepared for that visit with blood pressure readings and pulse rates.  Her weight is running about 168 pounds.  She is alert and seems to be keeping up with things well although  she has been confined in the house due to COVID-19.  She is due for her dermatology screen in July and we will try to arrange this with [facility] dermatology.  We will also ask her to come in and get some routine blood work.  She will need to follow-up in the office in about 4 months.  Physical exam deferred due to nature of telephonic visit.  ASSESSMENT & PLAN    Time:   Today, I have spent 28 minutes with the patient via telephone discussing the above including Covid precautions.     Visit Diagnoses: 1. Mouth sores -These are some better with only the upper lip inside being involved at this time with redness.  When she sees the dermatologist she will discuss this with him.  2. Mixed hyperlipidemia -Continue with statin therapy and as aggressive therapeutic lifestyle changes as possible  3. Pre-diabetes -Continue with therapeutic lifestyle changes  4. Post-menopausal -DEXA scans every 2 years  5. Essential hypertension -Check blood pressures more regularly and watch sodium intake  6. Vitamin D deficiency -Check vitamin D level at next visit with blood draw  7. Gastroesophageal reflux disease without esophagitis -Patient has had increased heartburn even though she is taking Nexium every morning before breakfast.  She will add Pepcid AC regularly and take this before supper and make sure that this helps.  She will also discuss the need by the cardiologist for her baby aspirin.  I told her to stay on this for the time being.  8. Cutaneous lupus erythematosus -Appointment with dermatology  9. Generalized anxiety disorder -Continue with Xanax as needed and avoid caffeine    Patient Instructions  Patient should continue to practice good hand and respiratory hygiene She should follow-up with her visit with Dr. Percival Spanish as planned We will try to get her an appointment with the dermatologist in July for a good screen check and have them to look at her upper lip at that time. She  will continue to drink plenty of water and fluids and stay well-hydrated She will make every effort to keep from falling and avoid climbing. She plans to resume her walking as I told her this would be good for her mental health as well as her bone strength. She will come to the office and work this time out with the nurse for routine lab work      The above assessment and management plan was discussed with the patient. The patient verbalized understanding of and has agreed to the management plan. Patient is aware to call the clinic if symptoms persist or worsen. Patient is aware when to return to the clinic for a follow-up visit. Patient educated on when it is appropriate to go to the emergency department.    Chipper Herb, MD [facility] [place], [place], [place] [address] Ph [phone]   Arrie Senate MD

## 2019-02-01 ENCOUNTER — Encounter: Payer: Self-pay | Admitting: *Deleted

## 2019-02-01 ENCOUNTER — Telehealth: Payer: Self-pay | Admitting: *Deleted

## 2019-02-01 DIAGNOSIS — M9903 Segmental and somatic dysfunction of lumbar region: Secondary | ICD-10-CM | POA: Diagnosis not present

## 2019-02-01 DIAGNOSIS — M9904 Segmental and somatic dysfunction of sacral region: Secondary | ICD-10-CM | POA: Diagnosis not present

## 2019-02-01 DIAGNOSIS — M62830 Muscle spasm of back: Secondary | ICD-10-CM | POA: Diagnosis not present

## 2019-02-01 DIAGNOSIS — M9901 Segmental and somatic dysfunction of cervical region: Secondary | ICD-10-CM | POA: Diagnosis not present

## 2019-02-01 NOTE — Telephone Encounter (Signed)
Spoke with pt regarding upcoming appointment, she is not having problems and is willing to do a virtual visit. She does not have a way for video so a telephone visit will be done. Consent for tele-health visit mailed to patient.

## 2019-02-03 ENCOUNTER — Other Ambulatory Visit: Payer: Self-pay | Admitting: Family Medicine

## 2019-02-08 ENCOUNTER — Other Ambulatory Visit: Payer: Self-pay | Admitting: Family Medicine

## 2019-02-10 ENCOUNTER — Telehealth: Payer: Self-pay | Admitting: Family Medicine

## 2019-02-10 NOTE — Telephone Encounter (Signed)
Aware to come tomorrow for labs

## 2019-02-11 ENCOUNTER — Other Ambulatory Visit: Payer: Medicare Other

## 2019-02-11 ENCOUNTER — Other Ambulatory Visit: Payer: Self-pay

## 2019-02-11 DIAGNOSIS — F411 Generalized anxiety disorder: Secondary | ICD-10-CM | POA: Diagnosis not present

## 2019-02-11 DIAGNOSIS — I10 Essential (primary) hypertension: Secondary | ICD-10-CM | POA: Diagnosis not present

## 2019-02-11 DIAGNOSIS — L932 Other local lupus erythematosus: Secondary | ICD-10-CM

## 2019-02-11 DIAGNOSIS — K1379 Other lesions of oral mucosa: Secondary | ICD-10-CM

## 2019-02-11 DIAGNOSIS — E782 Mixed hyperlipidemia: Secondary | ICD-10-CM

## 2019-02-11 DIAGNOSIS — Z780 Asymptomatic menopausal state: Secondary | ICD-10-CM

## 2019-02-11 DIAGNOSIS — R7303 Prediabetes: Secondary | ICD-10-CM

## 2019-02-11 DIAGNOSIS — K219 Gastro-esophageal reflux disease without esophagitis: Secondary | ICD-10-CM

## 2019-02-11 DIAGNOSIS — Z1283 Encounter for screening for malignant neoplasm of skin: Secondary | ICD-10-CM | POA: Diagnosis not present

## 2019-02-11 DIAGNOSIS — E559 Vitamin D deficiency, unspecified: Secondary | ICD-10-CM

## 2019-02-11 LAB — BAYER DCA HB A1C WAIVED: HB A1C (BAYER DCA - WAIVED): 6 % (ref <7.0)

## 2019-02-12 LAB — CBC WITH DIFFERENTIAL/PLATELET
Basophils Absolute: 0 10*3/uL (ref 0.0–0.2)
Basos: 1 %
EOS (ABSOLUTE): 0.2 10*3/uL (ref 0.0–0.4)
Eos: 4 %
Hematocrit: 38 % (ref 34.0–46.6)
Hemoglobin: 12.8 g/dL (ref 11.1–15.9)
Immature Grans (Abs): 0 10*3/uL (ref 0.0–0.1)
Immature Granulocytes: 0 %
Lymphocytes Absolute: 1.7 10*3/uL (ref 0.7–3.1)
Lymphs: 32 %
MCH: 30.9 pg (ref 26.6–33.0)
MCHC: 33.7 g/dL (ref 31.5–35.7)
MCV: 92 fL (ref 79–97)
Monocytes Absolute: 0.6 10*3/uL (ref 0.1–0.9)
Monocytes: 12 %
Neutrophils Absolute: 2.7 10*3/uL (ref 1.4–7.0)
Neutrophils: 51 %
Platelets: 226 10*3/uL (ref 150–450)
RBC: 4.14 x10E6/uL (ref 3.77–5.28)
RDW: 13 % (ref 11.7–15.4)
WBC: 5.2 10*3/uL (ref 3.4–10.8)

## 2019-02-12 LAB — BMP8+EGFR
BUN/Creatinine Ratio: 24 (ref 12–28)
BUN: 17 mg/dL (ref 8–27)
CO2: 23 mmol/L (ref 20–29)
Calcium: 10.1 mg/dL (ref 8.7–10.3)
Chloride: 96 mmol/L (ref 96–106)
Creatinine, Ser: 0.72 mg/dL (ref 0.57–1.00)
GFR calc Af Amer: 91 mL/min/{1.73_m2} (ref >59)
GFR calc non Af Amer: 79 mL/min/{1.73_m2} (ref >59)
Glucose: 104 mg/dL — ABNORMAL HIGH (ref 65–99)
Potassium: 4.9 mmol/L (ref 3.5–5.2)
Sodium: 134 mmol/L (ref 134–144)

## 2019-02-12 LAB — HEPATIC FUNCTION PANEL
ALT: 28 IU/L (ref 0–32)
AST: 36 IU/L (ref 0–40)
Albumin: 4.1 g/dL (ref 3.7–4.7)
Alkaline Phosphatase: 73 IU/L (ref 39–117)
Bilirubin Total: 0.4 mg/dL (ref 0.0–1.2)
Bilirubin, Direct: 0.12 mg/dL (ref 0.00–0.40)
Total Protein: 6.8 g/dL (ref 6.0–8.5)

## 2019-02-12 LAB — LIPID PANEL
Chol/HDL Ratio: 3.2 ratio (ref 0.0–4.4)
Cholesterol, Total: 148 mg/dL (ref 100–199)
HDL: 46 mg/dL (ref >39)
LDL Calculated: 70 mg/dL (ref 0–99)
Triglycerides: 160 mg/dL — ABNORMAL HIGH (ref 0–149)
VLDL Cholesterol Cal: 32 mg/dL (ref 5–40)

## 2019-02-12 LAB — VITAMIN D 25 HYDROXY (VIT D DEFICIENCY, FRACTURES): Vit D, 25-Hydroxy: 55.7 ng/mL (ref 30.0–100.0)

## 2019-02-14 DIAGNOSIS — M9901 Segmental and somatic dysfunction of cervical region: Secondary | ICD-10-CM | POA: Diagnosis not present

## 2019-02-14 DIAGNOSIS — M62830 Muscle spasm of back: Secondary | ICD-10-CM | POA: Diagnosis not present

## 2019-02-14 DIAGNOSIS — M9903 Segmental and somatic dysfunction of lumbar region: Secondary | ICD-10-CM | POA: Diagnosis not present

## 2019-02-14 DIAGNOSIS — M9904 Segmental and somatic dysfunction of sacral region: Secondary | ICD-10-CM | POA: Diagnosis not present

## 2019-02-15 ENCOUNTER — Ambulatory Visit: Payer: Medicare Other | Admitting: Family Medicine

## 2019-02-15 ENCOUNTER — Telehealth: Payer: Self-pay | Admitting: Cardiology

## 2019-02-15 NOTE — Telephone Encounter (Signed)
home phone/ consent/ my chart/ pre reg completed °

## 2019-02-16 NOTE — Progress Notes (Signed)
Virtual Visit via Video Note changed to phone visit at patient request as no smart phone.   This visit type was conducted due to national recommendations for restrictions regarding the COVID-19 Pandemic (e.g. social distancing) in an effort to limit this patient's exposure and mitigate transmission in our community.  Due to her co-morbid illnesses, this patient is at least at moderate risk for complications without adequate follow up.  This format is felt to be most appropriate for this patient at this time.  All issues noted in this document were discussed and addressed.  A limited physical exam was performed with this format.  Please refer to the patient's chart for [name].   Date:  02/21/2019   ID:  Misty, Smith [birth year], MRN [id number]  Patient Location: Home Provider Location: Home  PCP:  Dettinger, Fransisca Kaufmann, MD  Cardiologist:  Dr Stanford Breed  Evaluation Performed:  Follow-Up Visit  Chief Complaint:  FU palpitations and hypertension  History of Present Illness:    FU palpitations and hypertension. Seen in the past secondary to hypertension, hyperlipidemia, and palpitations felt secondary to PVCs. She had a syncopal episode in April of 2010 that we felt was most likely vagal. An echocardiogram showed normal LV function. A Myoview showed an ejection fraction of 80% and normal perfusion. These were performed on February 20, 2009. Chest CT 4/15 showed coronary calcification. Since I last saw her, the patient has dyspnea with more extreme activities but not with routine activities. It is relieved with rest. It is not associated with chest pain. There is no orthopnea, PND or pedal edema. There is no syncope or palpitations. There is no exertional chest pain.   The patient does not have symptoms concerning for COVID-19 infection (fever, chills, cough, or new shortness of breath).    Past Medical History:  Diagnosis Date  . Allergy   . Anxiety   .  Arthritis   . Bronchitis   . Cataract   . Colovaginal fistula   . Depression   . Diabetes mellitus without complication ([place])   . Dysrhythmia    palpitations, occasional PVCs; Dr Stanford Breed cardiologist  . Esophageal stricture   . Fractured elbow 1970's  . GERD (gastroesophageal reflux disease)   . H/O hiatal hernia   . Hyperlipidemia   . Hypertension   . Osteopenia   . Perimenopausal vasomotor symptoms   . Postmenopausal HRT (hormone replacement therapy)   . Pre-diabetes   . Stress   . Subacute lupus erythematosus    Dr. Allyson Sabal   Past Surgical History:  Procedure Laterality Date  . abdominal bypass  1987   fibroids   . ABDOMINAL HYSTERECTOMY     partial  . bilateral tubal ligtation  1972  . CHOLECYSTECTOMY N/A 03/21/2014   Procedure: LAPAROSCOPIC CHOLECYSTECTOMY WITH INTRAOPERATIVE CHOLANGIOGRAM;  Surgeon: Shann Medal, MD;  Location: [place];  Service: General;  Laterality: N/A;  . COLONOSCOPY    . elbow Right 1976   surgery after fracture of elbow  . EYE SURGERY Bilateral    cataracts  . ROTATOR CUFF REPAIR Right 03/29/2018  . TONSILLECTOMY    . TUBAL LIGATION       Current Meds  Medication Sig  . ALPRAZolam (XANAX) 0.25 MG tablet Take 1 tablet (0.25 mg total) by mouth at bedtime as needed for anxiety.  Marland Kitchen aspirin EC 81 MG tablet Take 1 tablet (81 mg total) by mouth daily.  Marland Kitchen atorvastatin (LIPITOR) 80 MG tablet  Take 40 mg by mouth daily at 6 PM.  . Calcium Carb-Cholecalciferol (CALTRATE 600+D) 600-800 MG-UNIT TABS Take 1 tablet by mouth every evening.   . cholecalciferol (VITAMIN D) 1000 UNITS tablet Take 2,000 Units by mouth daily.  Marland Kitchen escitalopram (LEXAPRO) 20 MG tablet Take 10 mg by mouth daily.  Marland Kitchen esomeprazole (NEXIUM) 40 MG capsule TAKE 1 CAPSULE BY MOUTH EVERY DAY  . hydrochlorothiazide (HYDRODIURIL) 12.5 MG tablet TAKE 1 TABLET BY MOUTH EVERY DAY  . lisinopril (ZESTRIL) 30 MG tablet TAKE 1 TABLET BY MOUTH EVERY DAY  . metoprolol tartrate (LOPRESSOR) 25 MG  tablet TAKE 1 TABLET BY MOUTH TWICE A DAY  . Multiple Vitamin (MULTIVITAMIN) tablet Take 1 tablet by mouth every morning.   Marland Kitchen omega-3 acid ethyl esters (LOVAZA) 1 g capsule Take 2 g by mouth daily.  . pioglitazone (ACTOS) 30 MG tablet TAKE 1 TABLET BY MOUTH EVERY DAY     Allergies:   Ciprofloxacin, Codeine, Iohexol, Penicillins, Sulfonamide derivatives, Asa [aspirin], Keflex [cephalexin], and Morphine and related   Social History   Tobacco Use  . Smoking status: Never Smoker  . Smokeless tobacco: Never Used  Substance Use Topics  . Alcohol use: No    Alcohol/week: 0.0 standard drinks  . Drug use: No     Family Hx: The patient's family history includes Cancer in her brother, mother, and sister; Coronary artery disease in her father; Heart attack in her brother and sister; Heart disease in her mother and sister; Hyperlipidemia in her sister; Hypertension in her brother, brother, daughter, father, mother, sister, sister, and son; Kidney disease in her father; Stroke in her sister and sister.  ROS:   Please see the history of present illness.    No fevers, chills or productive cough. All other systems reviewed and are negative.   Recent Labs: 02/11/2019: ALT 28; BUN 17; Creatinine, Ser 0.72; Hemoglobin 12.8; Platelets 226; Potassium 4.9; Sodium 134   Recent Lipid Panel Lab Results  Component Value Date/Time   CHOL 148 02/11/2019 08:24 AM   TRIG 160 (H) 02/11/2019 08:24 AM   TRIG 108 07/15/2016 02:37 PM   HDL 46 02/11/2019 08:24 AM   HDL 53 07/15/2016 02:37 PM   CHOLHDL 3.2 02/11/2019 08:24 AM   LDLCALC 70 02/11/2019 08:24 AM   LDLCALC 62 05/16/2014 08:07 AM    Wt Readings from Last 3 Encounters:  02/21/19 167 lb (75.8 kg)  09/28/18 170 lb (77.1 kg)  06/29/18 171 lb (77.6 kg)     Objective:    Vital Signs:  BP (!) 155/64   Pulse (!) 54   Ht 5' (1.524 m)   Wt 167 lb (75.8 kg)   BMI 32.61 kg/m    VITAL SIGNS:  reviewed  No acute distress Answers questions  appropriately Normal affect Remainder physical examination not performed (telehealth visit; coronavirus pandemic)  ASSESSMENT & PLAN:    1. Coronary artery disease-this is based on previous calcium noted on CT scan.  Continue medical therapy with aspirin and statin. 2. Palpitations-symptoms are reasonably well controlled.  Continue present dose of metoprolol. 3. Hypertension-blood pressure is mildly elevated; increase lisinopril to 40 mg daily and follow.  Check potassium and renal function in 1 week. 4. Hyperlipidemia-continue statin.  COVID-19 Education: The importance of social distancing was discussed today.  Time:   Today, I have spent 17 minutes with the patient with telehealth technology discussing the above problems.     Medication Adjustments/Labs and Tests Ordered: Current medicines are reviewed at length  with the patient today.  Concerns regarding medicines are outlined above.   Tests Ordered: No orders of the defined types were placed in this encounter.   Medication Changes: No orders of the defined types were placed in this encounter.   Follow Up:  Virtual Visit or In Person in 1 year(s)  Signed, Kirk Ruths, MD  02/21/2019 8:42 AM    [place]

## 2019-02-18 ENCOUNTER — Ambulatory Visit (INDEPENDENT_AMBULATORY_CARE_PROVIDER_SITE_OTHER): Payer: Medicare Other

## 2019-02-18 ENCOUNTER — Other Ambulatory Visit: Payer: Self-pay

## 2019-02-18 VITALS — BP 155/64 | HR 54

## 2019-02-18 DIAGNOSIS — Z0130 Encounter for examination of blood pressure without abnormal findings: Secondary | ICD-10-CM

## 2019-02-18 NOTE — Progress Notes (Signed)
Patient presents to office today requesting a Bp check. She states that she has a telephone visit on Monday with her cardiologist and wanted an accurate Bp to share with them.

## 2019-02-21 ENCOUNTER — Other Ambulatory Visit: Payer: Self-pay | Admitting: *Deleted

## 2019-02-21 ENCOUNTER — Telehealth (INDEPENDENT_AMBULATORY_CARE_PROVIDER_SITE_OTHER): Payer: Medicare Other | Admitting: Cardiology

## 2019-02-21 VITALS — BP 155/64 | HR 54 | Ht 60.0 in | Wt 167.0 lb

## 2019-02-21 DIAGNOSIS — I2510 Atherosclerotic heart disease of native coronary artery without angina pectoris: Secondary | ICD-10-CM

## 2019-02-21 DIAGNOSIS — M9903 Segmental and somatic dysfunction of lumbar region: Secondary | ICD-10-CM | POA: Diagnosis not present

## 2019-02-21 DIAGNOSIS — I10 Essential (primary) hypertension: Secondary | ICD-10-CM

## 2019-02-21 DIAGNOSIS — M9901 Segmental and somatic dysfunction of cervical region: Secondary | ICD-10-CM | POA: Diagnosis not present

## 2019-02-21 DIAGNOSIS — E7800 Pure hypercholesterolemia, unspecified: Secondary | ICD-10-CM

## 2019-02-21 DIAGNOSIS — M9904 Segmental and somatic dysfunction of sacral region: Secondary | ICD-10-CM | POA: Diagnosis not present

## 2019-02-21 DIAGNOSIS — M62830 Muscle spasm of back: Secondary | ICD-10-CM | POA: Diagnosis not present

## 2019-02-21 MED ORDER — LISINOPRIL 40 MG PO TABS: 40.0000 mg | ORAL_TABLET | Freq: Every day | ORAL | 3 refills | Status: DC

## 2019-02-21 MED ORDER — LISINOPRIL 10 MG PO TABS: 10.0000 mg | ORAL_TABLET | Freq: Every day | ORAL | 0 refills | Status: DC

## 2019-02-21 NOTE — Patient Instructions (Signed)
Medication Instructions:  INCREASE LISINOPRIL TO 40 MG ONCE DAILY If you need a refill on your cardiac medications before your next appointment, please call your pharmacy.   Lab work: Your physician recommends that you return for lab work in: [place] If you have labs (blood work) drawn today and your tests are completely normal, you will receive your results only by: Marland Kitchen MyChart Message (if you have MyChart) OR . A paper copy in the mail If you have any lab test that is abnormal or we need to change your treatment, we will call you to review the results.  Follow-Up: At [facility], you and your health needs are our priority.  As part of our continuing mission to provide you with exceptional heart care, we have created designated Provider Care Teams.  These Care Teams include your primary Cardiologist (physician) and Advanced Practice Providers (APPs -  Physician Assistants and Nurse Practitioners) who all work together to provide you with the care you need, when you need it. You will need a follow up appointment in 12 months.  Please call our office 2 months in advance to schedule this appointment.  You may see Kirk Ruths MD or one of the following Advanced Practice Providers on your designated Care Team:   Kerin Ransom, PA-C [place], [place] . Sande Rives, PA-C

## 2019-02-28 DIAGNOSIS — M9903 Segmental and somatic dysfunction of lumbar region: Secondary | ICD-10-CM | POA: Diagnosis not present

## 2019-02-28 DIAGNOSIS — M9904 Segmental and somatic dysfunction of sacral region: Secondary | ICD-10-CM | POA: Diagnosis not present

## 2019-02-28 DIAGNOSIS — M9901 Segmental and somatic dysfunction of cervical region: Secondary | ICD-10-CM | POA: Diagnosis not present

## 2019-02-28 DIAGNOSIS — M62830 Muscle spasm of back: Secondary | ICD-10-CM | POA: Diagnosis not present

## 2019-03-02 ENCOUNTER — Other Ambulatory Visit: Payer: Self-pay

## 2019-03-02 ENCOUNTER — Other Ambulatory Visit: Payer: Medicare Other

## 2019-03-02 DIAGNOSIS — I10 Essential (primary) hypertension: Secondary | ICD-10-CM | POA: Diagnosis not present

## 2019-03-03 ENCOUNTER — Encounter: Payer: Self-pay | Admitting: *Deleted

## 2019-03-03 LAB — BASIC METABOLIC PANEL
BUN/Creatinine Ratio: 21 (ref 12–28)
BUN: 17 mg/dL (ref 8–27)
CO2: 21 mmol/L (ref 20–29)
Calcium: 9.7 mg/dL (ref 8.7–10.3)
Chloride: 96 mmol/L (ref 96–106)
Creatinine, Ser: 0.81 mg/dL (ref 0.57–1.00)
GFR calc Af Amer: 79 mL/min/{1.73_m2} (ref >59)
GFR calc non Af Amer: 69 mL/min/{1.73_m2} (ref >59)
Glucose: 110 mg/dL — ABNORMAL HIGH (ref 65–99)
Potassium: 4.6 mmol/L (ref 3.5–5.2)
Sodium: 134 mmol/L (ref 134–144)

## 2019-03-07 DIAGNOSIS — M9903 Segmental and somatic dysfunction of lumbar region: Secondary | ICD-10-CM | POA: Diagnosis not present

## 2019-03-07 DIAGNOSIS — M62830 Muscle spasm of back: Secondary | ICD-10-CM | POA: Diagnosis not present

## 2019-03-07 DIAGNOSIS — M9901 Segmental and somatic dysfunction of cervical region: Secondary | ICD-10-CM | POA: Diagnosis not present

## 2019-03-07 DIAGNOSIS — M9904 Segmental and somatic dysfunction of sacral region: Secondary | ICD-10-CM | POA: Diagnosis not present

## 2019-03-10 ENCOUNTER — Other Ambulatory Visit: Payer: Self-pay | Admitting: Family Medicine

## 2019-03-17 DIAGNOSIS — L821 Other seborrheic keratosis: Secondary | ICD-10-CM | POA: Diagnosis not present

## 2019-03-17 DIAGNOSIS — D225 Melanocytic nevi of trunk: Secondary | ICD-10-CM | POA: Diagnosis not present

## 2019-03-17 DIAGNOSIS — L989 Disorder of the skin and subcutaneous tissue, unspecified: Secondary | ICD-10-CM | POA: Diagnosis not present

## 2019-03-17 DIAGNOSIS — L930 Discoid lupus erythematosus: Secondary | ICD-10-CM | POA: Diagnosis not present

## 2019-03-17 DIAGNOSIS — L814 Other melanin hyperpigmentation: Secondary | ICD-10-CM | POA: Diagnosis not present

## 2019-03-17 DIAGNOSIS — L718 Other rosacea: Secondary | ICD-10-CM | POA: Diagnosis not present

## 2019-03-17 DIAGNOSIS — D485 Neoplasm of uncertain behavior of skin: Secondary | ICD-10-CM | POA: Diagnosis not present

## 2019-03-22 ENCOUNTER — Other Ambulatory Visit: Payer: Self-pay | Admitting: Family Medicine

## 2019-03-22 DIAGNOSIS — M62830 Muscle spasm of back: Secondary | ICD-10-CM | POA: Diagnosis not present

## 2019-03-22 DIAGNOSIS — M9903 Segmental and somatic dysfunction of lumbar region: Secondary | ICD-10-CM | POA: Diagnosis not present

## 2019-03-22 DIAGNOSIS — M9904 Segmental and somatic dysfunction of sacral region: Secondary | ICD-10-CM | POA: Diagnosis not present

## 2019-03-22 DIAGNOSIS — M9901 Segmental and somatic dysfunction of cervical region: Secondary | ICD-10-CM | POA: Diagnosis not present

## 2019-04-04 DIAGNOSIS — M62830 Muscle spasm of back: Secondary | ICD-10-CM | POA: Diagnosis not present

## 2019-04-04 DIAGNOSIS — M9903 Segmental and somatic dysfunction of lumbar region: Secondary | ICD-10-CM | POA: Diagnosis not present

## 2019-04-04 DIAGNOSIS — M9904 Segmental and somatic dysfunction of sacral region: Secondary | ICD-10-CM | POA: Diagnosis not present

## 2019-04-04 DIAGNOSIS — M9901 Segmental and somatic dysfunction of cervical region: Secondary | ICD-10-CM | POA: Diagnosis not present

## 2019-04-18 DIAGNOSIS — M9901 Segmental and somatic dysfunction of cervical region: Secondary | ICD-10-CM | POA: Diagnosis not present

## 2019-04-18 DIAGNOSIS — M9904 Segmental and somatic dysfunction of sacral region: Secondary | ICD-10-CM | POA: Diagnosis not present

## 2019-04-18 DIAGNOSIS — M9903 Segmental and somatic dysfunction of lumbar region: Secondary | ICD-10-CM | POA: Diagnosis not present

## 2019-04-18 DIAGNOSIS — M62830 Muscle spasm of back: Secondary | ICD-10-CM | POA: Diagnosis not present

## 2019-04-19 ENCOUNTER — Other Ambulatory Visit: Payer: Self-pay | Admitting: Family Medicine

## 2019-04-25 ENCOUNTER — Other Ambulatory Visit: Payer: Self-pay | Admitting: Family Medicine

## 2019-04-29 ENCOUNTER — Other Ambulatory Visit: Payer: Self-pay | Admitting: Family Medicine

## 2019-05-02 DIAGNOSIS — M9903 Segmental and somatic dysfunction of lumbar region: Secondary | ICD-10-CM | POA: Diagnosis not present

## 2019-05-02 DIAGNOSIS — M62830 Muscle spasm of back: Secondary | ICD-10-CM | POA: Diagnosis not present

## 2019-05-02 DIAGNOSIS — M9901 Segmental and somatic dysfunction of cervical region: Secondary | ICD-10-CM | POA: Diagnosis not present

## 2019-05-02 DIAGNOSIS — M9904 Segmental and somatic dysfunction of sacral region: Secondary | ICD-10-CM | POA: Diagnosis not present

## 2019-05-04 DIAGNOSIS — K529 Noninfective gastroenteritis and colitis, unspecified: Secondary | ICD-10-CM | POA: Diagnosis not present

## 2019-05-04 DIAGNOSIS — K219 Gastro-esophageal reflux disease without esophagitis: Secondary | ICD-10-CM | POA: Diagnosis not present

## 2019-05-05 ENCOUNTER — Other Ambulatory Visit: Payer: Self-pay | Admitting: Family Medicine

## 2019-05-15 ENCOUNTER — Other Ambulatory Visit: Payer: Self-pay | Admitting: Cardiology

## 2019-05-16 DIAGNOSIS — M9901 Segmental and somatic dysfunction of cervical region: Secondary | ICD-10-CM | POA: Diagnosis not present

## 2019-05-16 DIAGNOSIS — M9903 Segmental and somatic dysfunction of lumbar region: Secondary | ICD-10-CM | POA: Diagnosis not present

## 2019-05-16 DIAGNOSIS — M9904 Segmental and somatic dysfunction of sacral region: Secondary | ICD-10-CM | POA: Diagnosis not present

## 2019-05-16 DIAGNOSIS — M62830 Muscle spasm of back: Secondary | ICD-10-CM | POA: Diagnosis not present

## 2019-06-02 DIAGNOSIS — D3132 Benign neoplasm of left choroid: Secondary | ICD-10-CM | POA: Diagnosis not present

## 2019-06-02 DIAGNOSIS — Z961 Presence of intraocular lens: Secondary | ICD-10-CM | POA: Diagnosis not present

## 2019-06-02 DIAGNOSIS — D3131 Benign neoplasm of right choroid: Secondary | ICD-10-CM | POA: Diagnosis not present

## 2019-06-02 DIAGNOSIS — H04123 Dry eye syndrome of bilateral lacrimal glands: Secondary | ICD-10-CM | POA: Diagnosis not present

## 2019-06-03 ENCOUNTER — Ambulatory Visit: Payer: Medicare Other | Admitting: Family Medicine

## 2019-06-20 ENCOUNTER — Other Ambulatory Visit: Payer: Self-pay

## 2019-06-21 ENCOUNTER — Ambulatory Visit (INDEPENDENT_AMBULATORY_CARE_PROVIDER_SITE_OTHER): Payer: Medicare Other

## 2019-06-21 DIAGNOSIS — Z23 Encounter for immunization: Secondary | ICD-10-CM | POA: Diagnosis not present

## 2019-06-22 ENCOUNTER — Other Ambulatory Visit: Payer: Self-pay

## 2019-06-23 ENCOUNTER — Ambulatory Visit (INDEPENDENT_AMBULATORY_CARE_PROVIDER_SITE_OTHER): Payer: Medicare Other | Admitting: Family Medicine

## 2019-06-23 ENCOUNTER — Encounter: Payer: Self-pay | Admitting: Family Medicine

## 2019-06-23 VITALS — BP 149/65 | HR 54 | Temp 97.5°F | Ht 60.0 in | Wt 166.4 lb

## 2019-06-23 DIAGNOSIS — I2510 Atherosclerotic heart disease of native coronary artery without angina pectoris: Secondary | ICD-10-CM

## 2019-06-23 DIAGNOSIS — E7800 Pure hypercholesterolemia, unspecified: Secondary | ICD-10-CM

## 2019-06-23 DIAGNOSIS — R7303 Prediabetes: Secondary | ICD-10-CM

## 2019-06-23 DIAGNOSIS — F411 Generalized anxiety disorder: Secondary | ICD-10-CM | POA: Diagnosis not present

## 2019-06-23 DIAGNOSIS — Z23 Encounter for immunization: Secondary | ICD-10-CM | POA: Diagnosis not present

## 2019-06-23 DIAGNOSIS — I10 Essential (primary) hypertension: Secondary | ICD-10-CM | POA: Diagnosis not present

## 2019-06-23 LAB — BAYER DCA HB A1C WAIVED: HB A1C (BAYER DCA - WAIVED): 5.9 % (ref <7.0)

## 2019-06-23 NOTE — Progress Notes (Signed)
BP (!) 149/65   Pulse (!) 54   Temp (!) 97.5 F (36.4 C) (Temporal)   Ht 5' (1.524 m)   Wt 166 lb 6.4 oz (75.5 kg)   SpO2 98%   BMI 32.50 kg/m    Subjective:   Patient ID: Misty Smith, female    DOB: 1941/05/12, 78 y.o.   MRN: [id number]  HPI: Misty Smith is a 78 y.o. female presenting on 06/23/2019 for Medical Management of Chronic Issues (4 month- [facility] pt )   HPI Prediabetes Patient comes in today for recheck of his diabetes. Patient has been currently taking Actos. Patient is currently on an ACE inhibitor/ARB. Patient has seen an ophthalmologist this year. Patient denies any issues with their feet.   Hypertension Patient is currently on lisinopril hydrochlorothiazide and metoprolol, and their blood pressure today is 149/65, she sees Dr. Stanford Breed for this. Patient denies any lightheadedness or dizziness. Patient denies headaches, blurred vision, chest pains, shortness of breath, or weakness. Denies any side effects from medication and is content with current medication.   Hyperlipidemia Patient is coming in for recheck of his hyperlipidemia. The patient is currently taking atorvastatin and fish oil although she only takes a fish oil infrequently. They deny any issues with myalgias or history of liver damage from it. They deny any focal numbness or weakness or chest pain.   Anxiety Patient currently takes Lexapro 10 for her anxiety and feels like it is doing really well for her.  She has the alprazolam on file because she received it a little over a year ago but has used it hardly at all and just likes to keep it there just in case.  Patient denies any suicidal ideations and says that her mood is doing very well and she is very happy and content with where it is. Depression screen [facility] 2/9 06/23/2019 09/28/2018 06/29/2018 05/19/2018 01/13/2018  Decreased Interest 0 0 0 0 0  Down, Depressed, Hopeless 0 0 0 0 0  PHQ - 2 Score 0 0 0 0 0     Relevant past medical, surgical,  family and social history reviewed and updated as indicated. Interim medical history since our last visit reviewed. Allergies and medications reviewed and updated.  Review of Systems  Constitutional: Negative for chills and fever.  Eyes: Negative for visual disturbance.  Respiratory: Negative for chest tightness and shortness of breath.   Cardiovascular: Negative for chest pain and leg swelling.  Genitourinary: Negative for difficulty urinating and dysuria.  Musculoskeletal: Negative for back pain and gait problem.  Skin: Negative for rash.  Neurological: Negative for light-headedness and headaches.  Psychiatric/Behavioral: Negative for agitation and behavioral problems.  All other systems reviewed and are negative.   Per HPI unless specifically indicated above   Allergies as of 06/23/2019      Reactions   Ciprofloxacin Rash   Codeine Rash   Iohexol     Desc: PT HAD HIVES AND WAS GIVEN BENEDRYL PO BY NURSE AND OBSERVED IN NURSES STATION AND RELEASED WITHOUT AND OTHER COMPLICATIONS SHEET SCANNED UNDER NURSES NOTES///ON 12/01/11 PT HAD ANOTHER CT WITH PREMEDS MINUS THE BENADRYL. SHE HAD A DELAYED REACTION AFTER WITH THROAT TIGHTNESS AND SOB, JB  12/07/12   Penicillins Hives   Sulfonamide Derivatives Hives   Asa [aspirin] Other (See Comments)   Large amounts causes stomach pain   Keflex [cephalexin] Nausea And Vomiting   Morphine And Related Rash      Medication List  Accurate as of June 23, 2019  8:41 AM. If you have any questions, ask your nurse or doctor.        ALPRAZolam 0.25 MG tablet Commonly known as: XANAX Take 1 tablet (0.25 mg total) by mouth at bedtime as needed for anxiety.   aspirin EC 81 MG tablet Take 1 tablet (81 mg total) by mouth daily.   atorvastatin 40 MG tablet Commonly known as: LIPITOR Take 40 mg by mouth daily. What changed: Another medication with the same name was removed. Continue taking this medication, and follow the directions you see  here. Changed by: Fransisca Kaufmann Dettinger, MD   Caltrate 600+D 600-800 MG-UNIT Tabs Generic drug: Calcium Carb-Cholecalciferol Take 1 tablet by mouth every evening.   cholecalciferol 1000 units tablet Commonly known as: VITAMIN D Take 2,000 Units by mouth daily.   escitalopram 10 MG tablet Commonly known as: LEXAPRO Take 10 mg by mouth daily. What changed: Another medication with the same name was removed. Continue taking this medication, and follow the directions you see here. Changed by: Fransisca Kaufmann Dettinger, MD   esomeprazole 40 MG capsule Commonly known as: NEXIUM TAKE 1 CAPSULE BY MOUTH EVERY DAY   hydrochlorothiazide 12.5 MG tablet Commonly known as: HYDRODIURIL TAKE 1 TABLET BY MOUTH EVERY DAY   lisinopril 40 MG tablet Commonly known as: ZESTRIL Take 1 tablet (40 mg total) by mouth daily. What changed: Another medication with the same name was removed. Continue taking this medication, and follow the directions you see here. Changed by: Fransisca Kaufmann Dettinger, MD   metoprolol tartrate 25 MG tablet Commonly known as: LOPRESSOR TAKE 1 TABLET BY MOUTH TWICE A DAY   multivitamin tablet Take 1 tablet by mouth every morning.   omega-3 acid ethyl esters 1 g capsule Commonly known as: LOVAZA Take 2 g by mouth daily.   pioglitazone 30 MG tablet Commonly known as: ACTOS TAKE 1 TABLET BY MOUTH EVERY DAY        Objective:   BP (!) 149/65   Pulse (!) 54   Temp (!) 97.5 F (36.4 C) (Temporal)   Ht 5' (1.524 m)   Wt 166 lb 6.4 oz (75.5 kg)   SpO2 98%   BMI 32.50 kg/m   Wt Readings from Last 3 Encounters:  06/23/19 166 lb 6.4 oz (75.5 kg)  02/21/19 167 lb (75.8 kg)  09/28/18 170 lb (77.1 kg)    Physical Exam Vitals signs and nursing note reviewed.  Constitutional:      General: She is not in acute distress.    Appearance: She is well-developed. She is not diaphoretic.  Eyes:     Conjunctiva/sclera: Conjunctivae normal.  Cardiovascular:     Rate and Rhythm: Normal  rate and regular rhythm.     Heart sounds: Normal heart sounds. No murmur.  Pulmonary:     Effort: Pulmonary effort is normal. No respiratory distress.     Breath sounds: Normal breath sounds. No wheezing.  Musculoskeletal: Normal range of motion.        General: No tenderness.  Skin:    General: Skin is warm and dry.     Findings: No rash.  Neurological:     Mental Status: She is alert and oriented to person, place, and time.     Coordination: Coordination normal.  Psychiatric:        Behavior: Behavior normal.       Assessment & Plan:   Problem List Items Addressed This Visit  Cardiovascular and Mediastinum   Essential hypertension   Relevant Medications   atorvastatin (LIPITOR) 40 MG tablet     Other   Pre-diabetes - Primary   Relevant Orders   Bayer DCA Hb A1c Waived   Hyperlipidemia   Relevant Medications   atorvastatin (LIPITOR) 40 MG tablet   Generalized anxiety disorder   Relevant Medications   escitalopram (LEXAPRO) 10 MG tablet      No changes in medication and continue current course, will check A1c today the rest of her blood work was good last time and return in 4 months Follow up plan: Return in about 4 months (around 10/24/2019), or if symptoms worsen or fail to improve, for diabetes.  Counseling provided for all of the vaccine components Orders Placed This Encounter  Procedures  . Pneumococcal polysaccharide vaccine 23-valent greater than or equal to 2yo subcutaneous/IM  . Bayer [facility] Hb A1c Waived    Caryl Pina, MD [facility] 06/23/2019, 8:41 AM

## 2019-07-19 ENCOUNTER — Other Ambulatory Visit: Payer: Self-pay | Admitting: Family Medicine

## 2019-08-04 DIAGNOSIS — M25562 Pain in left knee: Secondary | ICD-10-CM | POA: Diagnosis not present

## 2019-08-19 ENCOUNTER — Other Ambulatory Visit: Payer: Self-pay | Admitting: Family Medicine

## 2019-08-23 ENCOUNTER — Other Ambulatory Visit: Payer: Self-pay | Admitting: Family Medicine

## 2019-09-21 DIAGNOSIS — M25562 Pain in left knee: Secondary | ICD-10-CM | POA: Insufficient documentation

## 2019-10-13 ENCOUNTER — Other Ambulatory Visit: Payer: Self-pay | Admitting: Family Medicine

## 2019-10-24 ENCOUNTER — Ambulatory Visit: Payer: Medicare Other | Admitting: Family Medicine

## 2019-11-14 ENCOUNTER — Telehealth: Payer: Self-pay | Admitting: *Deleted

## 2019-11-14 NOTE — Telephone Encounter (Signed)
I s/w the pt and informed her that she will need an appt for pre op clearance. Pt see's Dr. Stanford Breed at [facility]. No inperson appts were available. I scheduled pt to see Ermalinda Barrios, [facility] at [facility] which I discovered one of the operators was scheduling in the same time slot I was for 11/15/19 @ 10:45 and were able to schedule their pt 1 minutes sooner than I was able. I advised the pt that I am going to send a message to the [facility] office and see if they can find a somewhere the pt can be squeezed in for pre op clearance. Pt thanked me for the call and the help and will wait to hear about appt.

## 2019-11-14 NOTE — Telephone Encounter (Signed)
   [facility] Pre-operative Risk Assessment    Request for surgical clearance:  1. What type of surgery is being performed? LEFT KNEE SCOPE   2. When is this surgery scheduled? 11/30/19   3. What type of clearance is required (medical clearance vs. Pharmacy clearance to hold med vs. Both)? MEDICAL  4. Are there any medications that need to be held prior to surgery and how long? ASA   5. Practice name and name of physician performing surgery? [facility]; DR. FRANK ALUISIO   6. What is your office phone number [phone]    7.   What is your office fax number [phone] ATTN: KELLY HANCOCK  8.   Anesthesia type (None, local, MAC, general) ? CHOICE   Julaine Hua 11/14/2019, 11:05 AM  _________________________________________________________________   (provider comments below)

## 2019-11-14 NOTE — Telephone Encounter (Signed)
Pt has appt with Dr. Audie Box 11/18/19 for pre op clearance. I will send clearance request to MD for upcoming appt. I will remove from the pre op call back pool. Will send FYI to surgeon Dr. Gaynelle Arabian pt has cardiology appt 11/18/19.

## 2019-11-14 NOTE — Telephone Encounter (Signed)
   Primary Cardiologist:Brian Stanford Breed, MD  Chart reviewed as part of pre-operative protocol coverage. Because of Misty Smith's past medical history and time since last visit, he/she will require a follow-up visit in order to better assess preoperative cardiovascular risk.  Pre-op covering staff: - Please schedule appointment and call patient to inform them. - Please contact requesting surgeon's office via preferred method (i.e, phone, fax) to inform them of need for appointment prior to surgery.   [place], [place]  11/14/2019, 12:32 PM

## 2019-11-15 ENCOUNTER — Ambulatory Visit: Payer: Medicare Other | Admitting: Physician Assistant

## 2019-11-16 ENCOUNTER — Telehealth: Payer: Self-pay | Admitting: Family Medicine

## 2019-11-16 NOTE — Progress Notes (Signed)
Cardiology Office Note:   Date:  11/18/2019  NAME:  Misty Smith    MRN: [id number] DOB:  02-Feb-1941   PCP:  Dettinger, Fransisca Kaufmann, MD  Cardiologist:  Kirk Ruths, MD   Referring MD: Dettinger, Fransisca Kaufmann, MD   Chief Complaint  Patient presents with  . Pre-op Exam   History of Present Illness:   Misty Smith is a 79 y.o. female with a hx of CAD, PVCs, HTN, HLD who presents for preoperative assessment.  She reports she will have a knee arthroscopy by Dr. Hector Shade on November 30, 2019.  She reports no symptoms today.  She reports she can walk up 1-2 flights of stairs without any shortness of breath or chest pain.  She reports she does not do structured exercise but has no limitations with getting around town or doing activities around the house such as cleaning.  Blood pressures well controlled today.  She has been followed by Dr. Stanford Breed for coronary calcium seen on a CT scan.  She had a normal cardiac stress test in 2010.  She has no symptoms today.  I did inform her that it is okay to hold her aspirin 5 days before procedure and to resume at the discretion of orthopedics.  Her blood pressure is well controlled today.  No medication changes needed.  Problem List 1. CAD (calcium seen on CT) -normal stress 2010 2. Palpitations/PVCs 3. Hypertension 4. Hyperlipidemia   Past Medical History: Past Medical History:  Diagnosis Date  . Allergy   . Anxiety   . Arthritis   . Bronchitis   . Cataract   . Colovaginal fistula   . Depression   . Diabetes mellitus without complication ([place])   . Dysrhythmia    palpitations, occasional PVCs; Dr Stanford Breed cardiologist  . Esophageal stricture   . Fractured elbow 1970's  . GERD (gastroesophageal reflux disease)   . H/O hiatal hernia   . Hyperlipidemia   . Hypertension   . Osteopenia   . Perimenopausal vasomotor symptoms   . Postmenopausal HRT (hormone replacement therapy)   . Pre-diabetes   . Stress   . Subacute lupus erythematosus      Dr. Allyson Sabal    Past Surgical History: Past Surgical History:  Procedure Laterality Date  . abdominal bypass  1987   fibroids   . ABDOMINAL HYSTERECTOMY     partial  . bilateral tubal ligtation  1972  . CHOLECYSTECTOMY N/A 03/21/2014   Procedure: LAPAROSCOPIC CHOLECYSTECTOMY WITH INTRAOPERATIVE CHOLANGIOGRAM;  Surgeon: Shann Medal, MD;  Location: [facility];  Service: General;  Laterality: N/A;  . COLONOSCOPY    . elbow Right 1976   surgery after fracture of elbow  . EYE SURGERY Bilateral    cataracts  . ROTATOR CUFF REPAIR Right 03/29/2018  . TONSILLECTOMY    . TUBAL LIGATION      Current Medications: Current Meds  Medication Sig  . ALPRAZolam (XANAX) 0.25 MG tablet Take 1 tablet (0.25 mg total) by mouth at bedtime as needed for anxiety.  Marland Kitchen aspirin EC 81 MG tablet Take 1 tablet (81 mg total) by mouth daily.  Marland Kitchen atorvastatin (LIPITOR) 40 MG tablet Take 40 mg by mouth daily.  . Calcium Carb-Cholecalciferol (CALTRATE 600+D) 600-800 MG-UNIT TABS Take 1 tablet by mouth every evening.   . cholecalciferol (VITAMIN D) 1000 UNITS tablet Take 2,000 Units by mouth daily.  Marland Kitchen escitalopram (LEXAPRO) 10 MG tablet Take 10 mg by mouth daily.  Marland Kitchen esomeprazole (NEXIUM) 40 MG capsule TAKE  1 CAPSULE BY MOUTH EVERY DAY  . hydrochlorothiazide (HYDRODIURIL) 12.5 MG tablet TAKE 1 TABLET BY MOUTH EVERY DAY  . lisinopril (ZESTRIL) 40 MG tablet Take 1 tablet (40 mg total) by mouth daily.  . metoprolol tartrate (LOPRESSOR) 25 MG tablet TAKE 1 TABLET BY MOUTH TWICE A DAY  . Multiple Vitamin (MULTIVITAMIN) tablet Take 1 tablet by mouth every morning.   Marland Kitchen omega-3 acid ethyl esters (LOVAZA) 1 g capsule Take 2 g by mouth daily.  . pioglitazone (ACTOS) 30 MG tablet TAKE 1 TABLET BY MOUTH EVERY DAY     Allergies:    Ciprofloxacin, Codeine, Iohexol, Penicillins, Sulfonamide derivatives, Asa [aspirin], Keflex [cephalexin], and Morphine and related   Social History: Social History   Socioeconomic History  .  Marital status: Widowed    Spouse name: Not on file  . Number of children: 4  . Years of education: Not on file  . Highest education level: Not on file  Occupational History  . Occupation: retired     Comment: [employer]   Tobacco Use  . Smoking status: Never Smoker  . Smokeless tobacco: Never Used  Substance and Sexual Activity  . Alcohol use: No    Alcohol/week: 0.0 standard drinks  . Drug use: No  . Sexual activity: Never  Other Topics Concern  . Not on file  Social History Narrative  . Not on file   Social Determinants of Health   Financial Resource Strain:   . Difficulty of Paying Living Expenses:   Food Insecurity:   . Worried About Charity fundraiser in the Last Year:   . Arboriculturist in the Last Year:   Transportation Needs:   . Film/video editor (Medical):   Marland Kitchen Lack of Transportation (Non-Medical):   Physical Activity:   . Days of Exercise per Week:   . Minutes of Exercise per Session:   Stress:   . Feeling of Stress :   Social Connections:   . Frequency of Communication with Friends and Family:   . Frequency of Social Gatherings with Friends and Family:   . Attends Religious Services:   . Active Member of Clubs or Organizations:   . Attends Archivist Meetings:   Marland Kitchen Marital Status:      Family History: The patient's family history includes Cancer in her brother, mother, and sister; Coronary artery disease in her father; Heart attack in her brother and sister; Heart disease in her mother and sister; Hyperlipidemia in her sister; Hypertension in her brother, brother, daughter, father, mother, sister, sister, and son; Kidney disease in her father; Stroke in her sister and sister.  ROS:   All other ROS reviewed and negative. Pertinent positives noted in the HPI.     EKGs/Labs/Other Studies Reviewed:   The following studies were personally reviewed by me today:  EKG:  EKG is ordered today.  The ekg ordered today demonstrates sinus  bradycardia, heart rate 51, no acute ST-T changes, no evidence of prior infarction, borderline low voltage noted in the limb leads, and was personally reviewed by me.   Recent Labs: 02/11/2019: ALT 28; Hemoglobin 12.8; Platelets 226 03/02/2019: BUN 17; Creatinine, Ser 0.81; Potassium 4.6; Sodium 134   Recent Lipid Panel    Component Value Date/Time   CHOL 148 02/11/2019 0824   TRIG 160 (H) 02/11/2019 0824   TRIG 108 07/15/2016 1437   HDL 46 02/11/2019 0824   HDL 53 07/15/2016 1437   CHOLHDL 3.2 02/11/2019 [id number]  [place] 70 02/11/2019 0824   LDLCALC 62 05/16/2014 0807    Physical Exam:   VS:  BP 130/70   Pulse (!) 51   Temp (!) 96.9 F (36.1 C)   Ht 5' (1.524 m)   Wt 167 lb (75.8 kg)   SpO2 97%   BMI 32.61 kg/m    Wt Readings from Last 3 Encounters:  11/18/19 167 lb (75.8 kg)  06/23/19 166 lb 6.4 oz (75.5 kg)  02/21/19 167 lb (75.8 kg)    General: Well nourished, well developed, in no acute distress Heart: Atraumatic, normal size  Eyes: PEERLA, EOMI  Neck: Supple, no JVD Endocrine: No thryomegaly Cardiac: Normal S1, S2; RRR; no murmurs, rubs, or gallops Lungs: Clear to auscultation bilaterally, no wheezing, rhonchi or rales  Abd: Soft, nontender, no hepatomegaly  Ext: No edema, pulses 2+ Musculoskeletal: No deformities, BUE and BLE strength normal and equal Skin: Warm and dry, no rashes   Neuro: Alert and oriented to person, place, time, and situation, CNII-XII grossly intact, no focal deficits  Psych: Normal mood and affect   ASSESSMENT:   Misty Smith is a 79 y.o. female who presents for the following: 1. Pre-op examination   2. Coronary artery disease involving native coronary artery of native heart without angina pectoris   3. Essential hypertension   4. Mixed hyperlipidemia     PLAN:   1. Pre-op examination -The Revised Cardiac Risk Index = 0 which equates to 0.4% (very low risk) risk of perioperative myocardial infarction, pulmonary edema, ventricular  fibrillation, cardiac arrest, or complete heart block.  -No further cardiac testing is recommended prior to surgery.  -She complete greater than 4 METS with no chest pain or shortness of breath. -The patient may proceed to surgery at acceptable risk.   -She will hold her aspirin 5 days before the procedure and then resume at discretion of orthopedics.  2. Coronary artery disease involving native coronary artery of native heart without angina pectoris -Coronary calcium seen on CT scan in the past.  Has been on aspirin statin.  Okay to hold aspirin for upcoming procedure.  No need for further testing.  3. Essential hypertension -Well-controlled on current medications  4. Mixed hyperlipidemia -Continue current medications  Disposition: Return if symptoms worsen or fail to improve.  Medication Adjustments/Labs and Tests Ordered: Current medicines are reviewed at length with the patient today.  Concerns regarding medicines are outlined above.  Orders Placed This Encounter  Procedures  . EKG 12-Lead   No orders of the defined types were placed in this encounter.   Patient Instructions  Medication Instructions:  The current medical regimen is effective;  continue present plan and medications.  *If you need a refill on your cardiac medications before your next appointment, please call your pharmacy*   Follow-Up: At [facility], you and your health needs are our priority.  As part of our continuing mission to provide you with exceptional heart care, we have created designated Provider Care Teams.  These Care Teams include your primary Cardiologist (physician) and Advanced Practice Providers (APPs -  Physician Assistants and Nurse Practitioners) who all work together to provide you with the care you need, when you need it.  We recommend signing up for the patient portal called "MyChart".  Sign up information is provided on this After Visit Summary.  MyChart is used to connect with  patients for Virtual Visits (Telemedicine).  Patients are able to view lab/test results, encounter notes, upcoming appointments, etc.  Non-urgent messages can be sent to your provider as well.   To learn more about what you can do with MyChart, go to [email or web address].    Your next appointment:   As needed  The format for your next appointment:   Either In Person or Virtual  Provider:   Eleonore Chiquito, MD        Time Spent with Patient: I have spent a total of 25 minutes with patient reviewing hospital notes, telemetry, EKGs, labs and examining the patient as well as establishing an assessment and plan that was discussed with the patient.  > 50% of time was spent in direct patient care.  Signed, Addison Naegeli. Audie Box, [place]  [address] [place], [place] [address] [phone]  11/18/2019 10:11 AM

## 2019-11-16 NOTE — Chronic Care Management (AMB) (Signed)
Chronic Care Management   Note  11/16/2019 Name: Misty Smith MRN: [id number] DOB: 10/07/1940  Misty Smith is a 79 y.o. year old female who is a primary care patient of Dettinger, Fransisca Kaufmann, MD. I reached out to [name] by phone today in response to a referral sent by [name]'s health plan.     [name] was given information about Chronic Care Management services today including:  1. CCM service includes personalized support from designated clinical staff supervised by her physician, including individualized plan of care and coordination with other care providers 2. 24/7 contact phone numbers for assistance for urgent and routine care needs. 3. Service will only be billed when office clinical staff spend 20 minutes or more in a month to coordinate care. 4. Only one practitioner may furnish and bill the service in a calendar month. 5. The patient may stop CCM services at any time (effective at the end of the month) by phone call to the office staff. 6. The patient will be responsible for cost sharing (co-pay) of up to 20% of the service fee (after annual deductible is met).  Patient did not agree to enrollment in care management services and does not wish to consider at this time.  Follow up plan: The patient has been provided with contact information for the care management team and has been advised to call with any health related questions or concerns.   Noreene Larsson, [place], [place], [place] [address] Direct Dial: [phone] [email or web address] Website: [email or web address]

## 2019-11-18 ENCOUNTER — Other Ambulatory Visit: Payer: Self-pay

## 2019-11-18 ENCOUNTER — Ambulatory Visit (INDEPENDENT_AMBULATORY_CARE_PROVIDER_SITE_OTHER): Payer: Medicare Other | Admitting: Cardiovascular Disease

## 2019-11-18 ENCOUNTER — Encounter: Payer: Self-pay | Admitting: Cardiovascular Disease

## 2019-11-18 VITALS — BP 130/70 | HR 51 | Temp 96.9°F | Ht 60.0 in | Wt 167.0 lb

## 2019-11-18 DIAGNOSIS — I10 Essential (primary) hypertension: Secondary | ICD-10-CM

## 2019-11-18 DIAGNOSIS — I2510 Atherosclerotic heart disease of native coronary artery without angina pectoris: Secondary | ICD-10-CM

## 2019-11-18 DIAGNOSIS — Z01818 Encounter for other preprocedural examination: Secondary | ICD-10-CM | POA: Diagnosis not present

## 2019-11-18 DIAGNOSIS — E782 Mixed hyperlipidemia: Secondary | ICD-10-CM

## 2019-11-18 NOTE — Patient Instructions (Signed)
Medication Instructions:  The current medical regimen is effective;  continue present plan and medications.  *If you need a refill on your cardiac medications before your next appointment, please call your pharmacy*   Follow-Up: At [facility], you and your health needs are our priority.  As part of our continuing mission to provide you with exceptional heart care, we have created designated Provider Care Teams.  These Care Teams include your primary Cardiologist (physician) and Advanced Practice Providers (APPs -  Physician Assistants and Nurse Practitioners) who all work together to provide you with the care you need, when you need it.  We recommend signing up for the patient portal called "MyChart".  Sign up information is provided on this After Visit Summary.  MyChart is used to connect with patients for Virtual Visits (Telemedicine).  Patients are able to view lab/test results, encounter notes, upcoming appointments, etc.  Non-urgent messages can be sent to your provider as well.   To learn more about what you can do with MyChart, go to [email or web address].    Your next appointment:   As needed  The format for your next appointment:   Either In Person or Virtual  Provider:   [place] O'Neal, MD      

## 2019-11-22 ENCOUNTER — Other Ambulatory Visit: Payer: Medicare Other

## 2019-11-22 ENCOUNTER — Other Ambulatory Visit: Payer: Self-pay | Admitting: Family Medicine

## 2019-11-22 ENCOUNTER — Other Ambulatory Visit: Payer: Self-pay

## 2019-11-22 DIAGNOSIS — I10 Essential (primary) hypertension: Secondary | ICD-10-CM

## 2019-11-22 DIAGNOSIS — R7303 Prediabetes: Secondary | ICD-10-CM

## 2019-11-22 DIAGNOSIS — E782 Mixed hyperlipidemia: Secondary | ICD-10-CM

## 2019-11-22 LAB — BAYER DCA HB A1C WAIVED: HB A1C (BAYER DCA - WAIVED): 6.1 % (ref <7.0)

## 2019-11-23 ENCOUNTER — Encounter: Payer: Self-pay | Admitting: Family Medicine

## 2019-11-23 ENCOUNTER — Other Ambulatory Visit: Payer: Self-pay

## 2019-11-23 ENCOUNTER — Ambulatory Visit (INDEPENDENT_AMBULATORY_CARE_PROVIDER_SITE_OTHER): Payer: Medicare Other | Admitting: Family Medicine

## 2019-11-23 VITALS — BP 132/58 | HR 53 | Temp 95.3°F | Ht 60.0 in | Wt 168.0 lb

## 2019-11-23 DIAGNOSIS — I10 Essential (primary) hypertension: Secondary | ICD-10-CM

## 2019-11-23 DIAGNOSIS — E7800 Pure hypercholesterolemia, unspecified: Secondary | ICD-10-CM | POA: Diagnosis not present

## 2019-11-23 DIAGNOSIS — R7303 Prediabetes: Secondary | ICD-10-CM | POA: Diagnosis not present

## 2019-11-23 DIAGNOSIS — K219 Gastro-esophageal reflux disease without esophagitis: Secondary | ICD-10-CM | POA: Diagnosis not present

## 2019-11-23 DIAGNOSIS — I2510 Atherosclerotic heart disease of native coronary artery without angina pectoris: Secondary | ICD-10-CM

## 2019-11-23 LAB — CBC WITH DIFFERENTIAL/PLATELET
Basophils Absolute: 0 10*3/uL (ref 0.0–0.2)
Basos: 1 %
EOS (ABSOLUTE): 0.2 10*3/uL (ref 0.0–0.4)
Eos: 4 %
Hematocrit: 34.3 % (ref 34.0–46.6)
Hemoglobin: 11.8 g/dL (ref 11.1–15.9)
Immature Grans (Abs): 0 10*3/uL (ref 0.0–0.1)
Immature Granulocytes: 0 %
Lymphocytes Absolute: 1.8 10*3/uL (ref 0.7–3.1)
Lymphs: 37 %
MCH: 32.7 pg (ref 26.6–33.0)
MCHC: 34.4 g/dL (ref 31.5–35.7)
MCV: 95 fL (ref 79–97)
Monocytes Absolute: 0.5 10*3/uL (ref 0.1–0.9)
Monocytes: 11 %
Neutrophils Absolute: 2.4 10*3/uL (ref 1.4–7.0)
Neutrophils: 47 %
Platelets: 221 10*3/uL (ref 150–450)
RBC: 3.61 x10E6/uL — ABNORMAL LOW (ref 3.77–5.28)
RDW: 12.5 % (ref 11.7–15.4)
WBC: 5 10*3/uL (ref 3.4–10.8)

## 2019-11-23 LAB — CMP14+EGFR
ALT: 20 IU/L (ref 0–32)
AST: 31 IU/L (ref 0–40)
Albumin/Globulin Ratio: 1.6 (ref 1.2–2.2)
Albumin: 4.1 g/dL (ref 3.7–4.7)
Alkaline Phosphatase: 83 IU/L (ref 39–117)
BUN/Creatinine Ratio: 17 (ref 12–28)
BUN: 12 mg/dL (ref 8–27)
Bilirubin Total: 0.3 mg/dL (ref 0.0–1.2)
CO2: 23 mmol/L (ref 20–29)
Calcium: 9.5 mg/dL (ref 8.7–10.3)
Chloride: 98 mmol/L (ref 96–106)
Creatinine, Ser: 0.69 mg/dL (ref 0.57–1.00)
GFR calc Af Amer: 95 mL/min/{1.73_m2} (ref >59)
GFR calc non Af Amer: 82 mL/min/{1.73_m2} (ref >59)
Globulin, Total: 2.6 g/dL (ref 1.5–4.5)
Glucose: 88 mg/dL (ref 65–99)
Potassium: 4.8 mmol/L (ref 3.5–5.2)
Sodium: 138 mmol/L (ref 134–144)
Total Protein: 6.7 g/dL (ref 6.0–8.5)

## 2019-11-23 LAB — LIPID PANEL
Chol/HDL Ratio: 2.9 ratio (ref 0.0–4.4)
Cholesterol, Total: 140 mg/dL (ref 100–199)
HDL: 48 mg/dL (ref >39)
LDL Chol Calc (NIH): 61 mg/dL (ref 0–99)
Triglycerides: 189 mg/dL — ABNORMAL HIGH (ref 0–149)
VLDL Cholesterol Cal: 31 mg/dL (ref 5–40)

## 2019-11-23 NOTE — Progress Notes (Signed)
BP (!) 132/58   Pulse (!) 53   Temp (!) 95.3 F (35.2 C)   Ht 5' (1.524 m)   Wt 168 lb (76.2 kg)   SpO2 97%   BMI 32.81 kg/m    Subjective:   Patient ID: Misty Smith, female    DOB: May 17, 1941, 79 y.o.   MRN: [id number]  HPI: Misty Smith is a 79 y.o. female presenting on 11/23/2019 for Medical Management of Chronic Issues (51m)   HPI Prediabetes Patient comes in today for recheck of his diabetes. Patient has been currently taking Actos and A1c 6.1. Patient is currently on an ACE inhibitor/ARB. Patient has not seen an ophthalmologist this year. Patient denies any issues with their feet.   Hypertension Patient is currently on lisinopril and hydrochlorothiazide and metoprolol, and their blood pressure today is 132/58. Patient denies any lightheadedness or dizziness. Patient denies headaches, blurred vision, chest pains, shortness of breath, or weakness. Denies any side effects from medication and is content with current medication.   GERD Patient is currently on Nexium.  She denies any major symptoms or abdominal pain or belching or burping. She denies any blood in her stool or lightheadedness or dizziness.   Hyperlipidemia Patient is coming in for recheck of his hyperlipidemia. The patient is currently taking fish oil and Lipitor. They deny any issues with myalgias or history of liver damage from it. They deny any focal numbness or weakness or chest pain.   Relevant past medical, surgical, family and social history reviewed and updated as indicated. Interim medical history since our last visit reviewed. Allergies and medications reviewed and updated.  Review of Systems  Constitutional: Negative for chills and fever.  Eyes: Negative for visual disturbance.  Respiratory: Negative for chest tightness and shortness of breath.   Cardiovascular: Negative for chest pain and leg swelling.  Musculoskeletal: Negative for back pain and gait problem.  Skin: Negative for rash.    Neurological: Negative for light-headedness and headaches.  Psychiatric/Behavioral: Negative for agitation and behavioral problems.  All other systems reviewed and are negative.   Per HPI unless specifically indicated above   Allergies as of 11/23/2019      Reactions   Ciprofloxacin Rash   Codeine Rash   Iohexol     Desc: PT HAD HIVES AND WAS GIVEN BENEDRYL PO BY NURSE AND OBSERVED IN NURSES STATION AND RELEASED WITHOUT AND OTHER COMPLICATIONS SHEET SCANNED UNDER NURSES NOTES///ON 12/01/11 PT HAD ANOTHER CT WITH PREMEDS MINUS THE BENADRYL. SHE HAD A DELAYED REACTION AFTER WITH THROAT TIGHTNESS AND SOB, JB  12/07/12   Penicillins Hives   Sulfonamide Derivatives Hives   Asa [aspirin] Other (See Comments)   Large amounts causes stomach pain   Keflex [cephalexin] Nausea And Vomiting   Morphine And Related Rash      Medication List       Accurate as of November 23, 2019 11:59 PM. If you have any questions, ask your nurse or doctor.        STOP taking these medications   ALPRAZolam 0.25 MG tablet Commonly known as: Duanne Moron Stopped by: Fransisca Kaufmann Dariyon Urquilla, MD     TAKE these medications   aspirin EC 81 MG tablet Take 1 tablet (81 mg total) by mouth daily.   atorvastatin 40 MG tablet Commonly known as: LIPITOR Take 40 mg by mouth daily.   Caltrate 600+D 600-800 MG-UNIT Tabs Generic drug: Calcium Carb-Cholecalciferol Take 1 tablet by mouth every evening.   cholecalciferol 1000 units tablet Commonly  known as: VITAMIN D Take 2,000 Units by mouth daily.   escitalopram 10 MG tablet Commonly known as: LEXAPRO Take 10 mg by mouth daily.   esomeprazole 40 MG capsule Commonly known as: NEXIUM TAKE 1 CAPSULE BY MOUTH EVERY DAY   hydrochlorothiazide 12.5 MG tablet Commonly known as: HYDRODIURIL TAKE 1 TABLET BY MOUTH EVERY DAY   lisinopril 40 MG tablet Commonly known as: ZESTRIL Take 1 tablet (40 mg total) by mouth daily.   metoprolol tartrate 25 MG tablet Commonly known as:  LOPRESSOR TAKE 1 TABLET BY MOUTH TWICE A DAY   multivitamin tablet Take 1 tablet by mouth every morning.   omega-3 acid ethyl esters 1 g capsule Commonly known as: LOVAZA Take 2 g by mouth daily.   pioglitazone 30 MG tablet Commonly known as: ACTOS TAKE 1 TABLET BY MOUTH EVERY DAY        Objective:   BP (!) 132/58   Pulse (!) 53   Temp (!) 95.3 F (35.2 C)   Ht 5' (1.524 m)   Wt 168 lb (76.2 kg)   SpO2 97%   BMI 32.81 kg/m   Wt Readings from Last 3 Encounters:  11/23/19 168 lb (76.2 kg)  11/18/19 167 lb (75.8 kg)  06/23/19 166 lb 6.4 oz (75.5 kg)    Physical Exam Vitals and nursing note reviewed.  Constitutional:      General: She is not in acute distress.    Appearance: She is well-developed. She is not diaphoretic.  Eyes:     Conjunctiva/sclera: Conjunctivae normal.  Cardiovascular:     Rate and Rhythm: Normal rate and regular rhythm.     Heart sounds: Normal heart sounds. No murmur.  Pulmonary:     Effort: Pulmonary effort is normal. No respiratory distress.     Breath sounds: Normal breath sounds. No wheezing.  Musculoskeletal:        General: No tenderness. Normal range of motion.  Skin:    General: Skin is warm and dry.     Findings: No rash.  Neurological:     Mental Status: She is alert and oriented to person, place, and time.     Coordination: Coordination normal.  Psychiatric:        Behavior: Behavior normal.     Results for orders placed or performed in visit on 11/22/19  Bayer DCA Hb A1c Waived  Result Value Ref Range   HB A1C (BAYER DCA - WAIVED) 6.1 <7.0 %    Assessment & Plan:   Problem List Items Addressed This Visit      Cardiovascular and Mediastinum   Essential hypertension     Digestive   GERD (gastroesophageal reflux disease)     Other   Pre-diabetes - Primary   Hyperlipidemia    Continue current medication, no changes, continue to watch for low blood sugars.  Follow up plan: Return in about 4 months (around  03/24/2020), or if symptoms worsen or fail to improve, for Diabetes and hypertension.  Counseling provided for all of the vaccine components No orders of the defined types were placed in this encounter.   Caryl Pina, MD [facility] 11/27/2019, 9:34 PM

## 2019-11-30 ENCOUNTER — Other Ambulatory Visit: Payer: Self-pay | Admitting: Family Medicine

## 2019-12-03 ENCOUNTER — Other Ambulatory Visit: Payer: Self-pay | Admitting: Family Medicine

## 2019-12-05 DIAGNOSIS — S83249A Other tear of medial meniscus, current injury, unspecified knee, initial encounter: Secondary | ICD-10-CM | POA: Insufficient documentation

## 2019-12-09 ENCOUNTER — Ambulatory Visit: Payer: Medicare Other | Admitting: Physician Assistant

## 2019-12-30 ENCOUNTER — Other Ambulatory Visit: Payer: Self-pay | Admitting: Family Medicine

## 2020-01-07 ENCOUNTER — Other Ambulatory Visit: Payer: Self-pay | Admitting: Family Medicine

## 2020-01-16 ENCOUNTER — Encounter (INDEPENDENT_AMBULATORY_CARE_PROVIDER_SITE_OTHER): Payer: Self-pay | Admitting: Ophthalmology

## 2020-01-16 ENCOUNTER — Other Ambulatory Visit: Payer: Self-pay

## 2020-01-16 ENCOUNTER — Ambulatory Visit (INDEPENDENT_AMBULATORY_CARE_PROVIDER_SITE_OTHER): Payer: 59 | Admitting: Ophthalmology

## 2020-01-16 DIAGNOSIS — D3132 Benign neoplasm of left choroid: Secondary | ICD-10-CM | POA: Diagnosis not present

## 2020-01-16 DIAGNOSIS — E119 Type 2 diabetes mellitus without complications: Secondary | ICD-10-CM

## 2020-01-16 NOTE — Assessment & Plan Note (Signed)
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  No interval changes over the last 5 years with photodocumentation

## 2020-01-16 NOTE — Progress Notes (Signed)
01/16/2020     CHIEF COMPLAINT Patient presents for Retina Follow Up   HISTORY OF PRESENT ILLNESS: Misty Smith is a 79 y.o. female who presents to the clinic today for:   HPI    Retina Follow Up    Patient presents with  Other.  In both eyes.  Duration of 1 year.  Since onset it is stable.          Comments    1 year follow up - FP OU Patient denies change in vision and overall has no complaints. LBS 107 Feb 2021 A1C unknown        Last edited by Gerda Diss on 01/16/2020  9:01 AM. (History)      Referring physician: Dettinger, Fransisca Kaufmann, MD [place],  [place] [address]  HISTORICAL INFORMATION:   Selected notes from the MEDICAL RECORD NUMBER    Lab Results  Component Value Date   HGBA1C 6.1 11/20/2019     CURRENT MEDICATIONS: No current outpatient medications on file. (Ophthalmic Drugs)   No current facility-administered medications for this visit. (Ophthalmic Drugs)   Current Outpatient Medications (Other)  Medication Sig  . aspirin EC 81 MG tablet Take 1 tablet (81 mg total) by mouth daily.  Marland Kitchen atorvastatin (LIPITOR) 40 MG tablet Take 40 mg by mouth daily.  Marland Kitchen atorvastatin (LIPITOR) 80 MG tablet TAKE AS DIRECTED  . Calcium Carb-Cholecalciferol (CALTRATE 600+D) 600-800 MG-UNIT TABS Take 1 tablet by mouth every evening.   . cholecalciferol (VITAMIN D) 1000 UNITS tablet Take 2,000 Units by mouth daily.  Marland Kitchen escitalopram (LEXAPRO) 10 MG tablet Take 10 mg by mouth daily.  Marland Kitchen esomeprazole (NEXIUM) 40 MG capsule TAKE 1 CAPSULE BY MOUTH EVERY DAY  . hydrochlorothiazide (HYDRODIURIL) 12.5 MG tablet TAKE 1 TABLET BY MOUTH EVERY DAY  . lisinopril (ZESTRIL) 40 MG tablet Take 1 tablet (40 mg total) by mouth daily.  . metoprolol tartrate (LOPRESSOR) 25 MG tablet TAKE 1 TABLET BY MOUTH TWICE A DAY  . Multiple Vitamin (MULTIVITAMIN) tablet Take 1 tablet by mouth every morning.   Marland Kitchen omega-3 acid ethyl esters (LOVAZA) 1 g capsule Take 2 g by mouth daily.  .  pioglitazone (ACTOS) 30 MG tablet TAKE 1 TABLET BY MOUTH EVERY DAY   No current facility-administered medications for this visit. (Other)      REVIEW OF SYSTEMS:    ALLERGIES Allergies  Allergen Reactions  . Ciprofloxacin Rash  . Codeine Rash  . Iohexol      Desc: PT HAD HIVES AND WAS GIVEN BENEDRYL PO BY NURSE AND OBSERVED IN NURSES STATION AND RELEASED WITHOUT AND OTHER COMPLICATIONS SHEET SCANNED UNDER NURSES NOTES///ON 12/01/11 PT HAD ANOTHER CT WITH PREMEDS MINUS THE BENADRYL. SHE HAD A DELAYED REACTION AFTER WITH THROAT TIGHTNESS AND SOB, JB  12/07/12   . Penicillins Hives  . Sulfonamide Derivatives Hives  . Asa [Aspirin] Other (See Comments)    Large amounts causes stomach pain  . Keflex [Cephalexin] Nausea And Vomiting  . Morphine And Related Rash    PAST MEDICAL HISTORY Past Medical History:  Diagnosis Date  . Allergy   . Anxiety   . Arthritis   . Bronchitis   . Cataract   . Colovaginal fistula   . Depression   . Diabetes mellitus without complication ([facility])   . Dysrhythmia    palpitations, occasional PVCs; Dr Stanford Breed cardiologist  . Esophageal stricture   . Fractured elbow 1970's  . GERD (gastroesophageal reflux disease)   .  H/O hiatal hernia   . Hyperlipidemia   . Hypertension   . Osteopenia   . Perimenopausal vasomotor symptoms   . Postmenopausal HRT (hormone replacement therapy)   . Pre-diabetes   . Stress   . Subacute lupus erythematosus    Dr. Allyson Sabal   Past Surgical History:  Procedure Laterality Date  . abdominal bypass  1987   fibroids   . ABDOMINAL HYSTERECTOMY     partial  . bilateral tubal ligtation  1972  . CHOLECYSTECTOMY N/A 03/21/2014   Procedure: LAPAROSCOPIC CHOLECYSTECTOMY WITH INTRAOPERATIVE CHOLANGIOGRAM;  Surgeon: Shann Medal, MD;  Location: [facility];  Service: General;  Laterality: N/A;  . COLONOSCOPY    . elbow Right 1976   surgery after fracture of elbow  . EYE SURGERY Bilateral    cataracts  . ROTATOR CUFF REPAIR Right  03/29/2018  . TONSILLECTOMY    . TUBAL LIGATION      FAMILY HISTORY Family History  Problem Relation Age of Onset  . Cancer Mother        laryngeal   . Hypertension Mother   . Heart disease Mother   . Hypertension Father   . Coronary artery disease Father   . Kidney disease Father        kidney failure   . Hypertension Sister   . Cancer Sister        breast  . Heart attack Sister   . Heart disease Sister   . Stroke Sister   . Stroke Sister   . Hypertension Brother   . Cancer Brother        liver  . Hypertension Sister   . Hyperlipidemia Sister   . Hypertension Brother   . Heart attack Brother   . Hypertension Son   . Hypertension Daughter     SOCIAL HISTORY Social History   Tobacco Use  . Smoking status: Never Smoker  . Smokeless tobacco: Never Used  Substance Use Topics  . Alcohol use: No    Alcohol/week: 0.0 standard drinks  . Drug use: No         OPHTHALMIC EXAM: Base Eye Exam    Visual Acuity (Snellen - Linear)      Right Left   Dist [place] 20/20-2 20/25-1       Tonometry (Tonopen, 9:04 AM)      Right Left   Pressure 13 10       Pupils      Pupils Dark Light Shape React APD   Right PERRL 4 3 Round Slow None   Left PERRL 4 3 Round Slow None       Visual Fields (Counting fingers)      Left Right    Full Full       Extraocular Movement      Right Left    Full Full       Neuro/Psych    Oriented x3: Yes   Mood/Affect: Normal       Dilation    Both eyes: 1.0% Mydriacyl, 2.5% Phenylephrine @ 9:04 AM        Slit Lamp and Fundus Exam    External Exam      Right Left   External Normal Normal       Slit Lamp Exam      Right Left   Lids/Lashes Normal Normal   Conjunctiva/Sclera White and quiet White and quiet   Cornea Clear Clear   Anterior Chamber Deep and quiet Deep and quiet   Iris Round  and reactive Round and reactive   Lens Posterior chamber intraocular lens Posterior chamber intraocular lens   Vitreous Normal Normal            IMAGING AND PROCEDURES  Imaging and Procedures for 01/16/20           ASSESSMENT/PLAN:  No problem-specific Assessment & Plan notes found for this encounter.    No diagnosis found.  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  No interval changes over the last 5 years with photodocumentation  2.  No detectable diabetic retinopathy  3.  Ophthalmic Meds Ordered this visit:  No orders of the defined types were placed in this encounter.      No follow-ups on file.  There are no Patient Instructions on file for this visit.   Explained the diagnoses, plan, and follow up with the patient and they expressed understanding.  Patient expressed understanding of the importance of proper follow up care.   Clent Demark Lyman Balingit M.D. Diseases & Surgery of the Retina and Vitreous [facility] 01/16/20     Abbreviations: M myopia (nearsighted); A astigmatism; H hyperopia (farsighted); P presbyopia; Mrx spectacle prescription;  CTL contact lenses; OD right eye; OS left eye; OU both eyes  XT exotropia; ET esotropia; PEK punctate epithelial keratitis; PEE punctate epithelial erosions; DES dry eye syndrome; MGD meibomian gland dysfunction; ATs artificial tears; PFAT's preservative free artificial tears; [place] nuclear sclerotic cataract; PSC posterior subcapsular cataract; ERM epi-retinal membrane; PVD posterior vitreous detachment; RD retinal detachment; DM diabetes mellitus; DR diabetic retinopathy; NPDR non-proliferative diabetic retinopathy; PDR proliferative diabetic retinopathy; CSME clinically significant macular edema; DME diabetic macular edema; dbh dot blot hemorrhages; CWS cotton wool spot; POAG primary open angle glaucoma; C/D cup-to-disc ratio; HVF humphrey visual field; GVF goldmann visual field; OCT optical coherence tomography; IOP intraocular pressure; BRVO Branch retinal  vein occlusion; CRVO central retinal vein occlusion; CRAO central retinal artery occlusion; BRAO branch retinal artery occlusion; RT retinal tear; SB scleral buckle; PPV pars plana vitrectomy; VH Vitreous hemorrhage; PRP panretinal laser photocoagulation; IVK intravitreal kenalog; VMT vitreomacular traction; MH Macular hole;  NVD neovascularization of the disc; NVE neovascularization elsewhere; AREDS age related eye disease study; ARMD age related macular degeneration; POAG primary open angle glaucoma; EBMD epithelial/anterior basement membrane dystrophy; ACIOL anterior chamber intraocular lens; IOL intraocular lens; PCIOL posterior chamber intraocular lens; Phaco/IOL phacoemulsification with intraocular lens placement; [place] photorefractive keratectomy; LASIK laser assisted in situ keratomileusis; HTN hypertension; DM diabetes mellitus; COPD chronic obstructive pulmonary disease

## 2020-01-16 NOTE — Assessment & Plan Note (Signed)

## 2020-02-01 ENCOUNTER — Other Ambulatory Visit: Payer: Self-pay | Admitting: Family Medicine

## 2020-02-02 ENCOUNTER — Other Ambulatory Visit: Payer: Self-pay | Admitting: Family Medicine

## 2020-02-02 ENCOUNTER — Other Ambulatory Visit: Payer: Self-pay | Admitting: Cardiology

## 2020-02-16 ENCOUNTER — Other Ambulatory Visit: Payer: Self-pay | Admitting: Family Medicine

## 2020-03-30 ENCOUNTER — Other Ambulatory Visit: Payer: Medicare Other

## 2020-03-30 ENCOUNTER — Other Ambulatory Visit: Payer: Self-pay

## 2020-03-30 DIAGNOSIS — R7303 Prediabetes: Secondary | ICD-10-CM

## 2020-03-30 DIAGNOSIS — E782 Mixed hyperlipidemia: Secondary | ICD-10-CM

## 2020-03-30 DIAGNOSIS — R399 Unspecified symptoms and signs involving the genitourinary system: Secondary | ICD-10-CM

## 2020-03-30 DIAGNOSIS — E7800 Pure hypercholesterolemia, unspecified: Secondary | ICD-10-CM

## 2020-03-30 DIAGNOSIS — I10 Essential (primary) hypertension: Secondary | ICD-10-CM

## 2020-03-30 DIAGNOSIS — K219 Gastro-esophageal reflux disease without esophagitis: Secondary | ICD-10-CM

## 2020-03-30 LAB — URINALYSIS, COMPLETE
Bilirubin, UA: NEGATIVE
Glucose, UA: NEGATIVE
Nitrite, UA: NEGATIVE
Specific Gravity, UA: 1.02 (ref 1.005–1.030)
Urobilinogen, Ur: 0.2 mg/dL (ref 0.2–1.0)
pH, UA: 5.5 (ref 5.0–7.5)

## 2020-03-30 LAB — CBC WITH DIFFERENTIAL/PLATELET
Basophils Absolute: 0 10*3/uL (ref 0.0–0.2)
Basos: 0 %
EOS (ABSOLUTE): 0.2 10*3/uL (ref 0.0–0.4)
Eos: 2 %
Hematocrit: 35 % (ref 34.0–46.6)
Hemoglobin: 11.8 g/dL (ref 11.1–15.9)
Immature Grans (Abs): 0 10*3/uL (ref 0.0–0.1)
Immature Granulocytes: 0 %
Lymphocytes Absolute: 1.8 10*3/uL (ref 0.7–3.1)
Lymphs: 18 %
MCH: 31.9 pg (ref 26.6–33.0)
MCHC: 33.7 g/dL (ref 31.5–35.7)
MCV: 95 fL (ref 79–97)
Monocytes Absolute: 0.9 10*3/uL (ref 0.1–0.9)
Monocytes: 9 %
Neutrophils Absolute: 7 10*3/uL (ref 1.4–7.0)
Neutrophils: 71 %
Platelets: 226 10*3/uL (ref 150–450)
RBC: 3.7 x10E6/uL — ABNORMAL LOW (ref 3.77–5.28)
RDW: 13.1 % (ref 11.7–15.4)
WBC: 9.9 10*3/uL (ref 3.4–10.8)

## 2020-03-30 LAB — CMP14+EGFR
ALT: 20 IU/L (ref 0–32)
AST: 29 IU/L (ref 0–40)
Albumin/Globulin Ratio: 1.4 (ref 1.2–2.2)
Albumin: 4 g/dL (ref 3.6–4.6)
Alkaline Phosphatase: 82 IU/L (ref 48–121)
BUN/Creatinine Ratio: 22 (ref 12–28)
BUN: 17 mg/dL (ref 8–27)
Bilirubin Total: 0.4 mg/dL (ref 0.0–1.2)
CO2: 24 mmol/L (ref 20–29)
Calcium: 9.8 mg/dL (ref 8.7–10.3)
Chloride: 97 mmol/L (ref 96–106)
Creatinine, Ser: 0.78 mg/dL (ref 0.57–1.00)
GFR calc Af Amer: 82 mL/min/{1.73_m2} (ref >59)
GFR calc non Af Amer: 72 mL/min/{1.73_m2} (ref >59)
Globulin, Total: 2.8 g/dL (ref 1.5–4.5)
Glucose: 104 mg/dL — ABNORMAL HIGH (ref 65–99)
Potassium: 4.9 mmol/L (ref 3.5–5.2)
Sodium: 134 mmol/L (ref 134–144)
Total Protein: 6.8 g/dL (ref 6.0–8.5)

## 2020-03-30 LAB — BAYER DCA HB A1C WAIVED: HB A1C (BAYER DCA - WAIVED): 6.4 % (ref <7.0)

## 2020-03-30 LAB — MICROSCOPIC EXAMINATION
Bacteria, UA: NONE SEEN
WBC, UA: >30 /hpf — AB (ref 0–5)

## 2020-04-02 ENCOUNTER — Ambulatory Visit (INDEPENDENT_AMBULATORY_CARE_PROVIDER_SITE_OTHER): Payer: Medicare Other | Admitting: Family Medicine

## 2020-04-02 ENCOUNTER — Encounter: Payer: Self-pay | Admitting: Family Medicine

## 2020-04-02 ENCOUNTER — Other Ambulatory Visit: Payer: Self-pay

## 2020-04-02 VITALS — BP 143/60 | HR 50 | Temp 97.3°F | Ht 60.0 in | Wt 168.0 lb

## 2020-04-02 DIAGNOSIS — F411 Generalized anxiety disorder: Secondary | ICD-10-CM | POA: Diagnosis not present

## 2020-04-02 DIAGNOSIS — I10 Essential (primary) hypertension: Secondary | ICD-10-CM

## 2020-04-02 DIAGNOSIS — E7800 Pure hypercholesterolemia, unspecified: Secondary | ICD-10-CM | POA: Diagnosis not present

## 2020-04-02 DIAGNOSIS — I2510 Atherosclerotic heart disease of native coronary artery without angina pectoris: Secondary | ICD-10-CM

## 2020-04-02 DIAGNOSIS — R7303 Prediabetes: Secondary | ICD-10-CM | POA: Diagnosis not present

## 2020-04-02 DIAGNOSIS — N3000 Acute cystitis without hematuria: Secondary | ICD-10-CM

## 2020-04-02 MED ORDER — ESCITALOPRAM OXALATE 20 MG PO TABS: 20.0000 mg | ORAL_TABLET | Freq: Every day | ORAL | 3 refills | Status: DC

## 2020-04-02 MED ORDER — NITROFURANTOIN MONOHYD MACRO 100 MG PO CAPS
100.0000 mg | ORAL_CAPSULE | Freq: Two times a day (BID) | ORAL | 0 refills | Status: DC
Start: 2020-04-02 — End: 2020-06-11

## 2020-04-02 NOTE — Progress Notes (Signed)
 BP (!) 143/60   Pulse 50   Temp (!) 97.3 F (36.3 C)   Ht 5' (1.524 m)   Wt 168 lb (76.2 kg)   SpO2 97%   BMI 32.81 kg/m    Subjective:   Patient ID: Misty Smith, female    DOB: 10/09/1940, 79 y.o.   MRN: [id number]  HPI: Misty Smith is a 79 y.o. female presenting on 04/02/2020 for Medical Management of Chronic Issues, Hypertension, and Prediabetes   HPI Patient comes in complaining of dysuria and burning and pain with urination has been going on over the past 5 days.  She left a urine and did some blood work last week.  She says she was like this before then she got better and now it has gotten worse.  She denies any fevers or chills or flank pain.  Patient is coming in for recheck for anxiety and depression.  She is taking the Lexapro 10 mg currently and she says there is been a lot of family illnesses and things going on and she has been struggling a little bit more with it recently and she would like to increase the Lexapro to see if it helps her a little bit more.  Patient denies any suicidal ideations or thoughts of hurting himself.  Prediabetes Patient comes in today for recheck of his diabetes. Patient has been currently taking Actos, will check A1c and blood work today. Patient is currently on an ACE inhibitor/ARB. Patient has not seen an ophthalmologist this year. Patient denies any issues with their feet. The symptom started onset as an adult hypertension and hyperlipidemia ARE RELATED TO DM   Hypertension Patient is currently on hydrochlorothiazide and lisinopril metoprolol, and their blood pressure today is 143/60. Patient denies any lightheadedness or dizziness. Patient denies headaches, blurred vision, chest pains, shortness of breath, or weakness. Denies any side effects from medication and is content with current medication.   Hyperlipidemia Patient is coming in for recheck of his hyperlipidemia. The patient is currently taking fish oils and atorvastatin, she  does admit that she cannot always take the full amount of fish oils because of indigestion, was instructed to take what she can. They deny any issues with myalgias or history of liver damage from it. They deny any focal numbness or weakness or chest pain.   Relevant past medical, surgical, family and social history reviewed and updated as indicated. Interim medical history since our last visit reviewed. Allergies and medications reviewed and updated.  Review of Systems  Constitutional: Negative for chills and fever.  Eyes: Negative for visual disturbance.  Respiratory: Negative for chest tightness and shortness of breath.   Cardiovascular: Negative for chest pain and leg swelling.  Gastrointestinal: Negative for abdominal pain.  Genitourinary: Positive for dysuria, frequency and urgency. Negative for flank pain and hematuria.  Musculoskeletal: Negative for back pain and gait problem.  Skin: Negative for rash.  Neurological: Negative for light-headedness and headaches.  Psychiatric/Behavioral: Negative for agitation, behavioral problems, dysphoric mood, self-injury, sleep disturbance and suicidal ideas. The patient is nervous/anxious.   All other systems reviewed and are negative.   Per HPI unless specifically indicated above   Allergies as of 04/02/2020      Reactions   Ciprofloxacin Rash   Codeine Rash   Iohexol     Desc: PT HAD HIVES AND WAS GIVEN BENEDRYL PO BY NURSE AND OBSERVED IN NURSES STATION AND RELEASED WITHOUT AND OTHER COMPLICATIONS SHEET SCANNED UNDER NURSES NOTES///ON 12/01/11 PT HAD   ANOTHER CT WITH PREMEDS MINUS THE BENADRYL. SHE HAD A DELAYED REACTION AFTER WITH THROAT TIGHTNESS AND SOB, JB  12/07/12   Penicillins Hives   Sulfonamide Derivatives Hives   Asa [aspirin] Other (See Comments)   Large amounts causes stomach pain   Keflex [cephalexin] Nausea And Vomiting   Morphine And Related Rash      Medication List       Accurate as of April 02, 2020 11:37 AM. If you  have any questions, ask your nurse or doctor.        aspirin EC 81 MG tablet Take 1 tablet (81 mg total) by mouth daily.   atorvastatin 40 MG tablet Commonly known as: LIPITOR Take 40 mg by mouth daily. What changed: Another medication with the same name was removed. Continue taking this medication, and follow the directions you see here. Changed by: Joshua A Dettinger, MD   Caltrate 600+D 600-800 MG-UNIT Tabs Generic drug: Calcium Carb-Cholecalciferol Take 1 tablet by mouth every evening.   cholecalciferol 1000 units tablet Commonly known as: VITAMIN D Take 2,000 Units by mouth daily.   escitalopram 20 MG tablet Commonly known as: LEXAPRO Take 1 tablet (20 mg total) by mouth daily. What changed:   medication strength  how much to take Changed by: Joshua A Dettinger, MD   esomeprazole 40 MG capsule Commonly known as: NEXIUM TAKE 1 CAPSULE BY MOUTH EVERY DAY   hydrochlorothiazide 12.5 MG tablet Commonly known as: HYDRODIURIL TAKE 1 TABLET BY MOUTH EVERY DAY   lisinopril 40 MG tablet Commonly known as: ZESTRIL TAKE 1 TABLET BY MOUTH EVERY DAY   metoprolol tartrate 25 MG tablet Commonly known as: LOPRESSOR TAKE 1 TABLET BY MOUTH TWICE A DAY   multivitamin tablet Take 1 tablet by mouth every morning.   nitrofurantoin (macrocrystal-monohydrate) 100 MG capsule Commonly known as: Macrobid Take 1 capsule (100 mg total) by mouth 2 (two) times daily. 1 po BId Started by: Joshua A Dettinger, MD   omega-3 acid ethyl esters 1 g capsule Commonly known as: LOVAZA TAKE 2 CAPSULES BY MOUTH TWICE A DAY   pioglitazone 30 MG tablet Commonly known as: ACTOS TAKE 1 TABLET BY MOUTH EVERY DAY        Objective:   BP (!) 143/60   Pulse 50   Temp (!) 97.3 F (36.3 C)   Ht 5' (1.524 m)   Wt 168 lb (76.2 kg)   SpO2 97%   BMI 32.81 kg/m   Wt Readings from Last 3 Encounters:  04/02/20 168 lb (76.2 kg)  11/23/19 168 lb (76.2 kg)  11/18/19 167 lb (75.8 kg)      Physical Exam Vitals and nursing note reviewed.  Constitutional:      General: She is not in acute distress.    Appearance: She is well-developed. She is not diaphoretic.  Eyes:     Conjunctiva/sclera: Conjunctivae normal.  Cardiovascular:     Rate and Rhythm: Normal rate and regular rhythm.     Heart sounds: Normal heart sounds. No murmur heard.   Pulmonary:     Effort: Pulmonary effort is normal. No respiratory distress.     Breath sounds: Normal breath sounds. No wheezing.  Abdominal:     General: Abdomen is flat. Bowel sounds are normal. There is no distension.     Tenderness: There is no abdominal tenderness. There is no guarding or rebound.  Musculoskeletal:        General: No tenderness. Normal range of motion.  Skin:      General: Skin is warm and dry.     Findings: No rash.  Neurological:     Mental Status: She is alert and oriented to person, place, and time.     Coordination: Coordination normal.  Psychiatric:        Behavior: Behavior normal.     Results for orders placed or performed in visit on 03/30/20  Microscopic Examination   Urine  Result Value Ref Range   WBC, UA >30 (A) 0 - 5 /hpf   RBC 0-2 0 - 2 /hpf   Epithelial Cells (non renal) 0-10 0 - 10 /hpf   Bacteria, UA None seen None seen/Few  CBC with Differential/Platelet  Result Value Ref Range   WBC 9.9 3.4 - 10.8 x10E3/uL   RBC 3.70 (L) 3.77 - 5.28 x10E6/uL   Hemoglobin 11.8 11.1 - 15.9 g/dL   Hematocrit 35.0 34.0 - 46.6 %   MCV 95 79 - 97 fL   MCH 31.9 26.6 - 33.0 pg   MCHC 33.7 31 - 35 g/dL   RDW 13.1 11.7 - 15.4 %   Platelets 226 150 - 450 x10E3/uL   Neutrophils 71 Not Estab. %   Lymphs 18 Not Estab. %   Monocytes 9 Not Estab. %   Eos 2 Not Estab. %   Basos 0 Not Estab. %   Neutrophils Absolute 7.0 1 - 7 x10E3/uL   Lymphocytes Absolute 1.8 0 - 3 x10E3/uL   Monocytes Absolute 0.9 0 - 0 x10E3/uL   EOS (ABSOLUTE) 0.2 0.0 - 0.4 x10E3/uL   Basophils Absolute 0.0 0 - 0 x10E3/uL   Immature  Granulocytes 0 Not Estab. %   Immature Grans (Abs) 0.0 0.0 - 0.1 x10E3/uL  CMP14+EGFR  Result Value Ref Range   Glucose 104 (H) 65 - 99 mg/dL   BUN 17 8 - 27 mg/dL   Creatinine, Ser 0.78 0.57 - 1.00 mg/dL   GFR calc non Af Amer 72 >59 mL/min/1.73   GFR calc Af Amer 82 >59 mL/min/1.73   BUN/Creatinine Ratio 22 12 - 28   Sodium 134 134 - 144 mmol/L   Potassium 4.9 3.5 - 5.2 mmol/L   Chloride 97 96 - 106 mmol/L   CO2 24 20 - 29 mmol/L   Calcium 9.8 8.7 - 10.3 mg/dL   Total Protein 6.8 6.0 - 8.5 g/dL   Albumin 4.0 3.6 - 4.6 g/dL   Globulin, Total 2.8 1.5 - 4.5 g/dL   Albumin/Globulin Ratio 1.4 1.2 - 2.2   Bilirubin Total 0.4 0.0 - 1.2 mg/dL   Alkaline Phosphatase 82 48 - 121 IU/L   AST 29 0 - 40 IU/L   ALT 20 0 - 32 IU/L  Urinalysis, Complete  Result Value Ref Range   Specific Gravity, UA 1.020 1.005 - 1.030   pH, UA 5.5 5.0 - 7.5   Color, UA Yellow Yellow   Appearance Ur Cloudy (A) Clear   Leukocytes,UA 2+ (A) Negative   Protein,UA 2+ (A) Negative/Trace   Glucose, UA Negative Negative   Ketones, UA Trace (A) Negative   RBC, UA 2+ (A) Negative   Bilirubin, UA Negative Negative   Urobilinogen, Ur 0.2 0.2 - 1.0 mg/dL   Nitrite, UA Negative Negative   Microscopic Examination See below:   Bayer DCA Hb A1c Waived  Result Value Ref Range   HB A1C (BAYER DCA - WAIVED) 6.4 <7.0 %    Assessment & Plan:   Problem List Items Addressed This Visit        Cardiovascular and Mediastinum   Essential hypertension   Relevant Orders   CMP14+EGFR     Other   Pre-diabetes - Primary   Relevant Orders   Bayer DCA Hb A1c Waived   CBC with Differential/Platelet   CMP14+EGFR   Hyperlipidemia   Relevant Orders   Lipid panel   Generalized anxiety disorder   Relevant Medications   escitalopram (LEXAPRO) 20 MG tablet   Other Relevant Orders   CBC with Differential/Platelet    Other Visit Diagnoses    Acute cystitis without hematuria          Patient has been having more anxiety  recently and will increase Lexapro from 10 mg to 20 mg.  Patient has UTI symptoms with positive urinalysis, unable to leave culture, will treat for possible UTI.    Follow up plan: Return in about 4 months (around 08/02/2020), or if symptoms worsen or fail to improve, for Prediabetes and cholesterol and hypertension recheck.  Counseling provided for all of the vaccine components Orders Placed This Encounter  Procedures  . Bayer DCA Hb A1c Waived  . CBC with Differential/Platelet  . CMP14+EGFR  . Lipid panel    Caryl Pina, MD [facility] 04/02/2020, 11:37 AM

## 2020-04-23 ENCOUNTER — Telehealth: Payer: Self-pay | Admitting: Family Medicine

## 2020-04-23 NOTE — Telephone Encounter (Signed)
Patient states that her sister came over today and took her bp since she was dizzy and it was 187/60's.  Had patient re take her bp while I was on the phone and her reading was 180/87.  States she is not sure if she took it right because it is hard to take herself.  States she feels better. Please advise

## 2020-04-23 NOTE — Telephone Encounter (Signed)
Please monitor blood pressures over the next few days and recheck it frequently, in the meantime stay hydrated and let me know if you are still having symptoms or if the dizziness improves.  If you develop any fevers or chills or any other symptoms let us know.

## 2020-04-23 NOTE — Telephone Encounter (Signed)
Misty Smith daughter of patient called in concerned about patient's bp readings. Today at 12:40pm 180/70. Pulse 54. Pt feels dizzy and doesn't feel good. Daughter is aware that is she not on HIPAA and that a nurse will call patient with advise at [phone].

## 2020-04-24 NOTE — Telephone Encounter (Signed)
I spoke with daughter and pt and advised both of provider feedback and they voiced understanding.

## 2020-04-25 ENCOUNTER — Other Ambulatory Visit: Payer: Self-pay | Admitting: Family Medicine

## 2020-04-26 ENCOUNTER — Telehealth: Payer: Self-pay | Admitting: Cardiology

## 2020-04-26 NOTE — Telephone Encounter (Signed)
Pt c/o BP issue: STAT if pt c/o blurred vision, one-sided weakness or slurred speech  1. What are your last 5 BP readings? 188/60, 165/62   2. Are you having any other symptoms (ex. Dizziness, headache, blurred vision, passed out)? No   3. What is your BP issue? Patient is concerned about her top number high.

## 2020-04-26 NOTE — Telephone Encounter (Signed)
Spoke to pt. She states typically she doesn't check her BP daily but felt a little dizzy on Monday and BP was 188/60. Since then, pt has been checking BP daily and report he remains elevated. Reading listed below. Pt currently asymptomatic but voiced concern.   8/24 -165/62 8/26 -170/75  Will forward to MD for recommendations.

## 2020-04-27 ENCOUNTER — Other Ambulatory Visit: Payer: Self-pay | Admitting: Family Medicine

## 2020-04-27 NOTE — Telephone Encounter (Signed)
Add amlodipine 5 mg daily and follow BP Misty Smith  

## 2020-04-30 MED ORDER — AMLODIPINE BESYLATE 5 MG PO TABS: 5.0000 mg | ORAL_TABLET | Freq: Every day | ORAL | 3 refills | Status: DC

## 2020-04-30 NOTE — Telephone Encounter (Signed)
Spoke with pt, Aware of dr crenshaw's recommendations. New script sent to the pharmacy  

## 2020-05-10 ENCOUNTER — Other Ambulatory Visit: Payer: Self-pay | Admitting: Family Medicine

## 2020-06-02 ENCOUNTER — Other Ambulatory Visit: Payer: Self-pay | Admitting: Family Medicine

## 2020-06-11 ENCOUNTER — Ambulatory Visit (INDEPENDENT_AMBULATORY_CARE_PROVIDER_SITE_OTHER): Payer: Medicare Other | Admitting: Family Medicine

## 2020-06-11 ENCOUNTER — Encounter: Payer: Self-pay | Admitting: Family Medicine

## 2020-06-11 ENCOUNTER — Other Ambulatory Visit: Payer: Self-pay

## 2020-06-11 VITALS — BP 139/56 | HR 49 | Temp 97.9°F | Ht 60.0 in | Wt 170.5 lb

## 2020-06-11 DIAGNOSIS — R300 Dysuria: Secondary | ICD-10-CM

## 2020-06-11 DIAGNOSIS — N3000 Acute cystitis without hematuria: Secondary | ICD-10-CM

## 2020-06-11 LAB — URINALYSIS
Bilirubin, UA: NEGATIVE
Glucose, UA: NEGATIVE
Nitrite, UA: NEGATIVE
RBC, UA: NEGATIVE
Specific Gravity, UA: 1.02 (ref 1.005–1.030)
Urobilinogen, Ur: 0.2 mg/dL (ref 0.2–1.0)
pH, UA: 5 (ref 5.0–7.5)

## 2020-06-11 MED ORDER — NITROFURANTOIN MONOHYD MACRO 100 MG PO CAPS: 100.0000 mg | ORAL_CAPSULE | Freq: Two times a day (BID) | ORAL | 0 refills | Status: AC

## 2020-06-11 NOTE — Progress Notes (Signed)
Subjective: CC: dysuria PCP: Dettinger, Misty Kaufmann, MD  [id number] [name] is a 79 y.o. female presenting to clinic today for:  1. Dysuria Patient reports a 7 day history of dysuria, frequency, and urgency. She denies hematuria, fevers, chills, abdominal pain, nausea, vomiting, back pain, vaginal discharge or vaginal bleeding.  Patient has not used anything for symptoms. Patient has a history of frequent or recurrent UTIs.     Relevant past medical, surgical, family, and social history reviewed and updated as indicated.  Allergies and medications reviewed and updated.  Allergies  Allergen Reactions  . Ciprofloxacin Rash  . Codeine Rash  . Iohexol      Desc: PT HAD HIVES AND WAS GIVEN BENEDRYL PO BY NURSE AND OBSERVED IN NURSES STATION AND RELEASED WITHOUT AND OTHER COMPLICATIONS SHEET SCANNED UNDER NURSES NOTES///ON 12/01/11 PT HAD ANOTHER CT WITH PREMEDS MINUS THE BENADRYL. SHE HAD A DELAYED REACTION AFTER WITH THROAT TIGHTNESS AND SOB, JB  12/07/12   . Penicillins Hives  . Sulfonamide Derivatives Hives  . Asa [Aspirin] Other (See Comments)    Large amounts causes stomach pain  . Keflex [Cephalexin] Nausea And Vomiting  . Morphine And Related Rash   Past Medical History:  Diagnosis Date  . Allergy   . Anxiety   . Arthritis   . Bronchitis   . Cataract   . Colovaginal fistula   . Depression   . Diabetes mellitus without complication ([place])   . Dysrhythmia    palpitations, occasional PVCs; Dr Stanford Breed cardiologist  . Esophageal stricture   . Fractured elbow 1970's  . GERD (gastroesophageal reflux disease)   . H/O hiatal hernia   . Hyperlipidemia   . Hypertension   . Osteopenia   . Perimenopausal vasomotor symptoms   . Postmenopausal HRT (hormone replacement therapy)   . Pre-diabetes   . Stress   . Subacute lupus erythematosus    Dr. Allyson Sabal    Current Outpatient Medications:  .  amLODipine (NORVASC) 5 MG tablet, Take 1 tablet (5 mg total) by mouth daily., Disp: 90  tablet, Rfl: 3 .  aspirin EC 81 MG tablet, Take 1 tablet (81 mg total) by mouth daily., Disp: 90 tablet, Rfl: 3 .  atorvastatin (LIPITOR) 40 MG tablet, Take 40 mg by mouth daily., Disp: , Rfl:  .  Calcium Carb-Cholecalciferol (CALTRATE 600+D) 600-800 MG-UNIT TABS, Take 1 tablet by mouth every evening. , Disp: , Rfl:  .  cholecalciferol (VITAMIN D) 1000 UNITS tablet, Take 2,000 Units by mouth daily., Disp: , Rfl:  .  escitalopram (LEXAPRO) 20 MG tablet, Take 1 tablet (20 mg total) by mouth daily., Disp: 90 tablet, Rfl: 3 .  esomeprazole (NEXIUM) 40 MG capsule, TAKE 1 CAPSULE BY MOUTH EVERY DAY, Disp: 90 capsule, Rfl: 1 .  hydrochlorothiazide (HYDRODIURIL) 12.5 MG tablet, TAKE 1 TABLET BY MOUTH EVERY DAY, Disp: 90 tablet, Rfl: 0 .  lisinopril (ZESTRIL) 40 MG tablet, TAKE 1 TABLET BY MOUTH EVERY DAY, Disp: 90 tablet, Rfl: 3 .  metoprolol tartrate (LOPRESSOR) 25 MG tablet, TAKE 1 TABLET BY MOUTH TWICE A DAY, Disp: 180 tablet, Rfl: 1 .  Multiple Vitamin (MULTIVITAMIN) tablet, Take 1 tablet by mouth every morning. , Disp: , Rfl:  .  omega-3 acid ethyl esters (LOVAZA) 1 g capsule, TAKE 2 CAPSULES BY MOUTH TWICE A DAY, Disp: 360 capsule, Rfl: 0 .  pioglitazone (ACTOS) 30 MG tablet, TAKE 1 TABLET BY MOUTH EVERY DAY, Disp: 90 tablet, Rfl: 0 Social History   Socioeconomic History  .  Marital status: Widowed    Spouse name: Not on file  . Number of children: 4  . Years of education: Not on file  . Highest education level: Not on file  Occupational History  . Occupation: retired     Comment: [employer]   Tobacco Use  . Smoking status: Never Smoker  . Smokeless tobacco: Never Used  Vaping Use  . Vaping Use: Never used  Substance and Sexual Activity  . Alcohol use: No    Alcohol/week: 0.0 standard drinks  . Drug use: No  . Sexual activity: Never  Other Topics Concern  . Not on file  Social History Narrative  . Not on file   Social Determinants of Health   Financial Resource Strain:    . Difficulty of Paying Living Expenses: Not on file  Food Insecurity:   . Worried About Charity fundraiser in the Last Year: Not on file  . Ran Out of Food in the Last Year: Not on file  Transportation Needs:   . Lack of Transportation (Medical): Not on file  . Lack of Transportation (Non-Medical): Not on file  Physical Activity:   . Days of Exercise per Week: Not on file  . Minutes of Exercise per Session: Not on file  Stress:   . Feeling of Stress : Not on file  Social Connections:   . Frequency of Communication with Friends and Family: Not on file  . Frequency of Social Gatherings with Friends and Family: Not on file  . Attends Religious Services: Not on file  . Active Member of Clubs or Organizations: Not on file  . Attends Archivist Meetings: Not on file  . Marital Status: Not on file  Intimate Partner Violence:   . Fear of Current or Ex-Partner: Not on file  . Emotionally Abused: Not on file  . Physically Abused: Not on file  . Sexually Abused: Not on file   Family History  Problem Relation Age of Onset  . Cancer Mother        laryngeal   . Hypertension Mother   . Heart disease Mother   . Hypertension Father   . Coronary artery disease Father   . Kidney disease Father        kidney failure   . Hypertension Sister   . Cancer Sister        breast  . Heart attack Sister   . Heart disease Sister   . Stroke Sister   . Stroke Sister   . Hypertension Brother   . Cancer Brother        liver  . Hypertension Sister   . Hyperlipidemia Sister   . Hypertension Brother   . Heart attack Brother   . Hypertension Son   . Hypertension Daughter     Review of Systems  Per HPI.  Objective: Office vital signs reviewed. BP (!) 139/56   Pulse (!) 49   Temp 97.9 F (36.6 C) (Temporal)   Ht 5' (1.524 m)   Wt 170 lb 8 oz (77.3 kg)   BMI 33.30 kg/m   Physical Examination:  Physical Exam Vitals and nursing note reviewed.  Constitutional:      General:  She is not in acute distress.    Appearance: She is not ill-appearing or diaphoretic.  HENT:     Head: Normocephalic and atraumatic.  Cardiovascular:     Rate and Rhythm: Normal rate and regular rhythm.     Heart sounds: Normal heart  sounds. No murmur heard.   Pulmonary:     Effort: Pulmonary effort is normal.     Breath sounds: Normal breath sounds.  Abdominal:     General: Bowel sounds are normal. There is no distension.     Palpations: Abdomen is soft.     Tenderness: There is no abdominal tenderness. There is no right CVA tenderness, left CVA tenderness, guarding or rebound.  Skin:    General: Skin is warm and dry.  Neurological:     General: No focal deficit present.     Mental Status: She is alert and oriented to person, place, and time.  Psychiatric:        Mood and Affect: Mood normal.        Behavior: Behavior normal.      Urine dipstick shows positive for leukocytes, protein, ketones.    Assessment/ Plan: Misty was seen today for dysuria.  Diagnoses and all orders for this visit:  Acute cystitis without hematuria Not enough urine available for micro or culture. Symptoms and UA dip consist with UTI. She has tolerated Macrobid in the recent past. No systemic symptoms today.  -     nitrofurantoin, macrocrystal-monohydrate, (MACROBID) 100 MG capsule; Take 1 capsule (100 mg total) by mouth 2 (two) times daily for 5 days. 1 po BId  Dysuria -     Urinalysis  Follow up as needed for new or worsening symptoms, or if symptoms persist.   The above assessment and management plan was discussed with the patient. The patient verbalized understanding of and has agreed to the management plan. Patient is aware to call the clinic if symptoms persist or worsen. Patient is aware when to return to the clinic for a follow-up visit. Patient educated on when it is appropriate to go to the emergency department.   Marjorie Smolder, FNP-C [facility] [address] [place], [place] [address] [phone]

## 2020-06-11 NOTE — Patient Instructions (Signed)

## 2020-06-19 ENCOUNTER — Other Ambulatory Visit: Payer: Self-pay

## 2020-06-19 ENCOUNTER — Ambulatory Visit (INDEPENDENT_AMBULATORY_CARE_PROVIDER_SITE_OTHER): Payer: Medicare Other

## 2020-06-19 DIAGNOSIS — Z23 Encounter for immunization: Secondary | ICD-10-CM | POA: Diagnosis not present

## 2020-07-11 ENCOUNTER — Other Ambulatory Visit: Payer: Self-pay | Admitting: Family Medicine

## 2020-07-31 ENCOUNTER — Other Ambulatory Visit: Payer: Medicare Other

## 2020-07-31 ENCOUNTER — Other Ambulatory Visit: Payer: Self-pay

## 2020-07-31 DIAGNOSIS — R7303 Prediabetes: Secondary | ICD-10-CM

## 2020-07-31 DIAGNOSIS — I10 Essential (primary) hypertension: Secondary | ICD-10-CM

## 2020-07-31 DIAGNOSIS — F411 Generalized anxiety disorder: Secondary | ICD-10-CM

## 2020-07-31 DIAGNOSIS — E7800 Pure hypercholesterolemia, unspecified: Secondary | ICD-10-CM

## 2020-07-31 LAB — LIPID PANEL

## 2020-07-31 LAB — BAYER DCA HB A1C WAIVED: HB A1C (BAYER DCA - WAIVED): 6.1 % (ref <7.0)

## 2020-08-01 LAB — CMP14+EGFR
ALT: 28 IU/L (ref 0–32)
AST: 30 IU/L (ref 0–40)
Albumin/Globulin Ratio: 1.5 (ref 1.2–2.2)
Albumin: 4.1 g/dL (ref 3.6–4.6)
Alkaline Phosphatase: 82 IU/L (ref 44–121)
BUN/Creatinine Ratio: 22 (ref 12–28)
BUN: 16 mg/dL (ref 8–27)
Bilirubin Total: 0.4 mg/dL (ref 0.0–1.2)
CO2: 26 mmol/L (ref 20–29)
Calcium: 9.9 mg/dL (ref 8.7–10.3)
Chloride: 97 mmol/L (ref 96–106)
Creatinine, Ser: 0.73 mg/dL (ref 0.57–1.00)
GFR calc Af Amer: 89 mL/min/{1.73_m2} (ref >59)
GFR calc non Af Amer: 77 mL/min/{1.73_m2} (ref >59)
Globulin, Total: 2.8 g/dL (ref 1.5–4.5)
Glucose: 105 mg/dL — ABNORMAL HIGH (ref 65–99)
Potassium: 5.4 mmol/L — ABNORMAL HIGH (ref 3.5–5.2)
Sodium: 137 mmol/L (ref 134–144)
Total Protein: 6.9 g/dL (ref 6.0–8.5)

## 2020-08-01 LAB — CBC WITH DIFFERENTIAL/PLATELET
Basophils Absolute: 0 10*3/uL (ref 0.0–0.2)
Basos: 1 %
EOS (ABSOLUTE): 0.2 10*3/uL (ref 0.0–0.4)
Eos: 3 %
Hematocrit: 36.6 % (ref 34.0–46.6)
Hemoglobin: 12.4 g/dL (ref 11.1–15.9)
Immature Grans (Abs): 0 10*3/uL (ref 0.0–0.1)
Immature Granulocytes: 0 %
Lymphocytes Absolute: 1.8 10*3/uL (ref 0.7–3.1)
Lymphs: 33 %
MCH: 31.3 pg (ref 26.6–33.0)
MCHC: 33.9 g/dL (ref 31.5–35.7)
MCV: 92 fL (ref 79–97)
Monocytes Absolute: 0.9 10*3/uL (ref 0.1–0.9)
Monocytes: 15 %
Neutrophils Absolute: 2.7 10*3/uL (ref 1.4–7.0)
Neutrophils: 48 %
Platelets: 263 10*3/uL (ref 150–450)
RBC: 3.96 x10E6/uL (ref 3.77–5.28)
RDW: 12.4 % (ref 11.7–15.4)
WBC: 5.6 10*3/uL (ref 3.4–10.8)

## 2020-08-01 LAB — LIPID PANEL
Chol/HDL Ratio: 2.8 ratio (ref 0.0–4.4)
Cholesterol, Total: 145 mg/dL (ref 100–199)
HDL: 51 mg/dL (ref >39)
LDL Chol Calc (NIH): 71 mg/dL (ref 0–99)
Triglycerides: 128 mg/dL (ref 0–149)
VLDL Cholesterol Cal: 23 mg/dL (ref 5–40)

## 2020-08-02 ENCOUNTER — Encounter: Payer: Self-pay | Admitting: Family Medicine

## 2020-08-02 ENCOUNTER — Ambulatory Visit (INDEPENDENT_AMBULATORY_CARE_PROVIDER_SITE_OTHER): Payer: Medicare Other | Admitting: Family Medicine

## 2020-08-02 ENCOUNTER — Other Ambulatory Visit: Payer: Self-pay

## 2020-08-02 VITALS — BP 140/52 | HR 50 | Temp 97.0°F | Ht 60.0 in | Wt 170.0 lb

## 2020-08-02 DIAGNOSIS — R7303 Prediabetes: Secondary | ICD-10-CM | POA: Diagnosis not present

## 2020-08-02 DIAGNOSIS — I2510 Atherosclerotic heart disease of native coronary artery without angina pectoris: Secondary | ICD-10-CM

## 2020-08-02 DIAGNOSIS — I10 Essential (primary) hypertension: Secondary | ICD-10-CM

## 2020-08-02 DIAGNOSIS — F411 Generalized anxiety disorder: Secondary | ICD-10-CM

## 2020-08-02 DIAGNOSIS — E7800 Pure hypercholesterolemia, unspecified: Secondary | ICD-10-CM | POA: Diagnosis not present

## 2020-08-02 MED ORDER — METOPROLOL TARTRATE 25 MG PO TABS
25.0000 mg | ORAL_TABLET | Freq: Two times a day (BID) | ORAL | 3 refills | Status: DC
Start: 2020-08-02 — End: 2020-09-24

## 2020-08-02 MED ORDER — OMEGA-3-ACID ETHYL ESTERS 1 G PO CAPS
2.0000 | ORAL_CAPSULE | Freq: Two times a day (BID) | ORAL | 3 refills | Status: DC
Start: 2020-08-02 — End: 2021-11-07

## 2020-08-02 MED ORDER — HYDROCHLOROTHIAZIDE 12.5 MG PO TABS
12.5000 mg | ORAL_TABLET | Freq: Every day | ORAL | 3 refills | Status: DC
Start: 2020-08-02 — End: 2021-04-01

## 2020-08-02 MED ORDER — PIOGLITAZONE HCL 30 MG PO TABS
30.0000 mg | ORAL_TABLET | Freq: Every day | ORAL | 0 refills | Status: DC
Start: 2020-08-02 — End: 2020-10-08

## 2020-08-02 MED ORDER — ESOMEPRAZOLE MAGNESIUM 40 MG PO CPDR
40.0000 mg | DELAYED_RELEASE_CAPSULE | Freq: Every day | ORAL | 3 refills | Status: DC
Start: 2020-08-02 — End: 2021-04-01

## 2020-08-02 NOTE — Progress Notes (Signed)
BP (!) 140/52   Pulse (!) 50   Temp (!) 97 F (36.1 C)   Ht 5' (1.524 m)   Wt 170 lb (77.1 kg)   SpO2 99%   BMI 33.20 kg/m    Subjective:   Patient ID: Misty Smith, female    DOB: Apr 26, 1941, 79 y.o.   MRN: [id number]  HPI: Misty Smith is a 79 y.o. female presenting on 08/02/2020 for Medical Management of Chronic Issues   HPI Prediabetes Patient comes in today for recheck of his diabetes. Patient has been currently taking Actos and A1c 6.1. Patient is currently on an ACE inhibitor/ARB. Patient has seen an ophthalmologist this year. Patient denies any issues with their feet. The symptom started onset as an adult hypertension and hyperlipidemia ARE RELATED TO DM   Hypertension Patient is currently on amlodipine and hydrochlorothiazide and lisinopril metoprolol, and their blood pressure today is 140/52. Patient denies any lightheadedness or dizziness. Patient denies headaches, blurred vision, chest pains, shortness of breath, or weakness. Denies any side effects from medication and is content with current medication.   Hyperlipidemia Patient is coming in for recheck of his hyperlipidemia. The patient is currently taking Lipitor. They deny any issues with myalgias or history of liver damage from it. They deny any focal numbness or weakness or chest pain.   Patient is coming in today for anxiety recheck.  She says she is doing well and continues to take her Lexapro and will continue forward with that.  She denies any side effects or issues with it.  Relevant past medical, surgical, family and social history reviewed and updated as indicated. Interim medical history since our last visit reviewed. Allergies and medications reviewed and updated.  Review of Systems  Constitutional: Negative for chills and fever.  Eyes: Negative for visual disturbance.  Respiratory: Negative for chest tightness and shortness of breath.   Cardiovascular: Negative for chest pain and leg swelling.    Musculoskeletal: Negative for back pain and gait problem.  Skin: Negative for rash.  Neurological: Negative for light-headedness and headaches.  Psychiatric/Behavioral: Negative for agitation and behavioral problems.  All other systems reviewed and are negative.   Per HPI unless specifically indicated above   Allergies as of 08/02/2020      Reactions   Ciprofloxacin Rash   Codeine Rash   Iohexol     Desc: PT HAD HIVES AND WAS GIVEN BENEDRYL PO BY NURSE AND OBSERVED IN NURSES STATION AND RELEASED WITHOUT AND OTHER COMPLICATIONS SHEET SCANNED UNDER NURSES NOTES///ON 12/01/11 PT HAD ANOTHER CT WITH PREMEDS MINUS THE BENADRYL. SHE HAD A DELAYED REACTION AFTER WITH THROAT TIGHTNESS AND SOB, JB  12/07/12   Penicillins Hives   Sulfonamide Derivatives Hives   Asa [aspirin] Other (See Comments)   Large amounts causes stomach pain   Keflex [cephalexin] Nausea And Vomiting   Morphine And Related Rash      Medication List       Accurate as of August 02, 2020  8:21 AM. If you have any questions, ask your nurse or doctor.        amLODipine 5 MG tablet Commonly known as: NORVASC Take 1 tablet (5 mg total) by mouth daily.   aspirin EC 81 MG tablet Take 1 tablet (81 mg total) by mouth daily.   atorvastatin 40 MG tablet Commonly known as: LIPITOR Take 40 mg by mouth daily.   Caltrate 600+D 600-800 MG-UNIT Tabs Generic drug: Calcium Carb-Cholecalciferol Take 1 tablet by mouth every evening.  cholecalciferol 1000 units tablet Commonly known as: VITAMIN D Take 2,000 Units by mouth daily.   escitalopram 20 MG tablet Commonly known as: LEXAPRO Take 1 tablet (20 mg total) by mouth daily.   esomeprazole 40 MG capsule Commonly known as: NEXIUM TAKE 1 CAPSULE BY MOUTH EVERY DAY   hydrochlorothiazide 12.5 MG tablet Commonly known as: HYDRODIURIL TAKE 1 TABLET BY MOUTH EVERY DAY   lisinopril 40 MG tablet Commonly known as: ZESTRIL TAKE 1 TABLET BY MOUTH EVERY DAY   metoprolol  tartrate 25 MG tablet Commonly known as: LOPRESSOR TAKE 1 TABLET BY MOUTH TWICE A DAY   multivitamin tablet Take 1 tablet by mouth every morning.   omega-3 acid ethyl esters 1 g capsule Commonly known as: LOVAZA TAKE 2 CAPSULES BY MOUTH TWICE A DAY   pioglitazone 30 MG tablet Commonly known as: ACTOS TAKE 1 TABLET BY MOUTH EVERY DAY        Objective:   BP (!) 140/52   Pulse (!) 50   Temp (!) 97 F (36.1 C)   Ht 5' (1.524 m)   Wt 170 lb (77.1 kg)   SpO2 99%   BMI 33.20 kg/m   Wt Readings from Last 3 Encounters:  08/02/20 170 lb (77.1 kg)  06/11/20 170 lb 8 oz (77.3 kg)  04/02/20 168 lb (76.2 kg)    Physical Exam Vitals and nursing note reviewed.  Constitutional:      General: She is not in acute distress.    Appearance: She is well-developed. She is not diaphoretic.  Eyes:     Conjunctiva/sclera: Conjunctivae normal.  Cardiovascular:     Rate and Rhythm: Normal rate and regular rhythm.     Heart sounds: Normal heart sounds. No murmur heard.   Pulmonary:     Effort: Pulmonary effort is normal. No respiratory distress.     Breath sounds: Normal breath sounds. No wheezing.  Musculoskeletal:        General: No tenderness. Normal range of motion.  Skin:    General: Skin is warm and dry.     Findings: No rash.  Neurological:     Mental Status: She is alert and oriented to person, place, and time.     Coordination: Coordination normal.  Psychiatric:        Behavior: Behavior normal.     Results for orders placed or performed in visit on 07/31/20  Lipid panel  Result Value Ref Range   Cholesterol, Total 145 100 - 199 mg/dL   Triglycerides 128 0 - 149 mg/dL   HDL 51 >39 mg/dL   VLDL Cholesterol Cal 23 5 - 40 mg/dL   LDL Chol Calc (NIH) 71 0 - 99 mg/dL   Chol/HDL Ratio 2.8 0.0 - 4.4 ratio  CMP14+EGFR  Result Value Ref Range   Glucose 105 (H) 65 - 99 mg/dL   BUN 16 8 - 27 mg/dL   Creatinine, Ser 0.73 0.57 - 1.00 mg/dL   GFR calc non Af Amer 77 >59  mL/min/1.73   GFR calc Af Amer 89 >59 mL/min/1.73   BUN/Creatinine Ratio 22 12 - 28   Sodium 137 134 - 144 mmol/L   Potassium 5.4 (H) 3.5 - 5.2 mmol/L   Chloride 97 96 - 106 mmol/L   CO2 26 20 - 29 mmol/L   Calcium 9.9 8.7 - 10.3 mg/dL   Total Protein 6.9 6.0 - 8.5 g/dL   Albumin 4.1 3.6 - 4.6 g/dL   Globulin, Total 2.8 1.5 - 4.5 g/dL  Albumin/Globulin Ratio 1.5 1.2 - 2.2   Bilirubin Total 0.4 0.0 - 1.2 mg/dL   Alkaline Phosphatase 82 44 - 121 IU/L   AST 30 0 - 40 IU/L   ALT 28 0 - 32 IU/L  CBC with Differential/Platelet  Result Value Ref Range   WBC 5.6 3.4 - 10.8 x10E3/uL   RBC 3.96 3.77 - 5.28 x10E6/uL   Hemoglobin 12.4 11.1 - 15.9 g/dL   Hematocrit 36.6 34.0 - 46.6 %   MCV 92 79 - 97 fL   MCH 31.3 26.6 - 33.0 pg   MCHC 33.9 31 - 35 g/dL   RDW 12.4 11.7 - 15.4 %   Platelets 263 150 - 450 x10E3/uL   Neutrophils 48 Not Estab. %   Lymphs 33 Not Estab. %   Monocytes 15 Not Estab. %   Eos 3 Not Estab. %   Basos 1 Not Estab. %   Neutrophils Absolute 2.7 1.40 - 7.00 x10E3/uL   Lymphocytes Absolute 1.8 0 - 3 x10E3/uL   Monocytes Absolute 0.9 0 - 0 x10E3/uL   EOS (ABSOLUTE) 0.2 0.0 - 0.4 x10E3/uL   Basophils Absolute 0.0 0 - 0 x10E3/uL   Immature Granulocytes 0 Not Estab. %   Immature Grans (Abs) 0.0 0.0 - 0.1 x10E3/uL  Bayer DCA Hb A1c Waived  Result Value Ref Range   HB A1C (BAYER DCA - WAIVED) 6.1 <7.0 %    Assessment & Plan:   Problem List Items Addressed This Visit      Cardiovascular and Mediastinum   Essential hypertension   Relevant Medications   metoprolol tartrate (LOPRESSOR) 25 MG tablet   omega-3 acid ethyl esters (LOVAZA) 1 g capsule   hydrochlorothiazide (HYDRODIURIL) 12.5 MG tablet     Other   Pre-diabetes   Hyperlipidemia - Primary   Relevant Medications   metoprolol tartrate (LOPRESSOR) 25 MG tablet   omega-3 acid ethyl esters (LOVAZA) 1 g capsule   hydrochlorothiazide (HYDRODIURIL) 12.5 MG tablet   Generalized anxiety disorder        Continue current medication, no changes. Follow up plan: Return in about 4 months (around 12/01/2020), or if symptoms worsen or fail to improve, for Prediabetes and hypertension and cholesterol recheck.  Counseling provided for all of the vaccine components No orders of the defined types were placed in this encounter.   Caryl Pina, MD [facility] 08/02/2020, 8:21 AM

## 2020-09-13 DIAGNOSIS — L905 Scar conditions and fibrosis of skin: Secondary | ICD-10-CM | POA: Diagnosis not present

## 2020-09-13 DIAGNOSIS — L821 Other seborrheic keratosis: Secondary | ICD-10-CM | POA: Diagnosis not present

## 2020-09-13 DIAGNOSIS — L814 Other melanin hyperpigmentation: Secondary | ICD-10-CM | POA: Diagnosis not present

## 2020-09-13 DIAGNOSIS — L281 Prurigo nodularis: Secondary | ICD-10-CM | POA: Diagnosis not present

## 2020-09-13 DIAGNOSIS — D1801 Hemangioma of skin and subcutaneous tissue: Secondary | ICD-10-CM | POA: Diagnosis not present

## 2020-09-13 DIAGNOSIS — L819 Disorder of pigmentation, unspecified: Secondary | ICD-10-CM | POA: Diagnosis not present

## 2020-09-17 NOTE — Progress Notes (Signed)
HPI: FU palpitations and hypertension. Seen in the past secondary to hypertension, hyperlipidemia, and palpitations felt secondary to PVCs. She had a syncopal episode in April of 2010 that we felt was most likely vagal. An echocardiogram showed normal LV function. A Myoview showed an ejection fraction of 80% and normal perfusion. These were performed on February 20, 2009. Chest CT 4/15 showed coronary calcification. Since I last saw her,she denies increased dyspnea, chest pain, palpitations or syncope.  Current Outpatient Medications  Medication Sig Dispense Refill  . amLODipine (NORVASC) 5 MG tablet Take 1 tablet (5 mg total) by mouth daily. 90 tablet 3  . aspirin EC 81 MG tablet Take 1 tablet (81 mg total) by mouth daily. 90 tablet 3  . atorvastatin (LIPITOR) 40 MG tablet Take 40 mg by mouth daily.    . Calcium Carb-Cholecalciferol 600-800 MG-UNIT TABS Take 1 tablet by mouth every evening.     . cholecalciferol (VITAMIN D) 1000 UNITS tablet Take 2,000 Units by mouth daily.    Marland Kitchen escitalopram (LEXAPRO) 20 MG tablet Take 1 tablet (20 mg total) by mouth daily. 90 tablet 3  . esomeprazole (NEXIUM) 40 MG capsule Take 1 capsule (40 mg total) by mouth daily. 90 capsule 3  . hydrochlorothiazide (HYDRODIURIL) 12.5 MG tablet Take 1 tablet (12.5 mg total) by mouth daily. 90 tablet 3  . lisinopril (ZESTRIL) 40 MG tablet TAKE 1 TABLET BY MOUTH EVERY DAY 90 tablet 3  . metoprolol tartrate (LOPRESSOR) 25 MG tablet Take 1 tablet (25 mg total) by mouth 2 (two) times daily. 180 tablet 3  . Multiple Vitamin (MULTIVITAMIN) tablet Take 1 tablet by mouth every morning.     Marland Kitchen omega-3 acid ethyl esters (LOVAZA) 1 g capsule Take 2 capsules (2 g total) by mouth 2 (two) times daily. 360 capsule 3  . pioglitazone (ACTOS) 30 MG tablet Take 1 tablet (30 mg total) by mouth daily. 90 tablet 0   No current facility-administered medications for this visit.     Past Medical History:  Diagnosis Date  . Allergy   .  Anxiety   . Arthritis   . Bronchitis   . Cataract   . Colovaginal fistula   . Depression   . Diabetes mellitus without complication ([place])   . Dysrhythmia    palpitations, occasional PVCs; Dr Stanford Breed cardiologist  . Esophageal stricture   . Fractured elbow 1970's  . GERD (gastroesophageal reflux disease)   . H/O hiatal hernia   . Hyperlipidemia   . Hypertension   . Osteopenia   . Perimenopausal vasomotor symptoms   . Postmenopausal HRT (hormone replacement therapy)   . Pre-diabetes   . Stress   . Subacute lupus erythematosus    Dr. Allyson Sabal    Past Surgical History:  Procedure Laterality Date  . abdominal bypass  1987   fibroids   . ABDOMINAL HYSTERECTOMY     partial  . bilateral tubal ligtation  1972  . CHOLECYSTECTOMY N/A 03/21/2014   Procedure: LAPAROSCOPIC CHOLECYSTECTOMY WITH INTRAOPERATIVE CHOLANGIOGRAM;  Surgeon: Shann Medal, MD;  Location: [facility];  Service: General;  Laterality: N/A;  . COLONOSCOPY    . elbow Right 1976   surgery after fracture of elbow  . EYE SURGERY Bilateral    cataracts  . ROTATOR CUFF REPAIR Right 03/29/2018  . TONSILLECTOMY    . TUBAL LIGATION      Social History   Socioeconomic History  . Marital status: Widowed    Spouse name: Not on  file  . Number of children: 4  . Years of education: Not on file  . Highest education level: Not on file  Occupational History  . Occupation: retired     Comment: [employer]   Tobacco Use  . Smoking status: Never Smoker  . Smokeless tobacco: Never Used  Vaping Use  . Vaping Use: Never used  Substance and Sexual Activity  . Alcohol use: No    Alcohol/week: 0.0 standard drinks  . Drug use: No  . Sexual activity: Never  Other Topics Concern  . Not on file  Social History Narrative  . Not on file   Social Determinants of Health   Financial Resource Strain: Not on file  Food Insecurity: Not on file  Transportation Needs: Not on file  Physical Activity: Not on file  Stress: Not  on file  Social Connections: Not on file  Intimate Partner Violence: Not on file    Family History  Problem Relation Age of Onset  . Cancer Mother        laryngeal   . Hypertension Mother   . Heart disease Mother   . Hypertension Father   . Coronary artery disease Father   . Kidney disease Father        kidney failure   . Hypertension Sister   . Cancer Sister        breast  . Heart attack Sister   . Heart disease Sister   . Stroke Sister   . Stroke Sister   . Hypertension Brother   . Cancer Brother        liver  . Hypertension Sister   . Hyperlipidemia Sister   . Hypertension Brother   . Heart attack Brother   . Hypertension Son   . Hypertension Daughter     ROS: no fevers or chills, productive cough, hemoptysis, dysphasia, odynophagia, melena, hematochezia, dysuria, hematuria, rash, seizure activity, orthopnea, PND, pedal edema, claudication. Remaining systems are negative.  Physical Exam: Well-developed well-nourished in no acute distress.  Skin is warm and dry.  HEENT is normal.  Neck is supple.  Chest is clear to auscultation with normal expansion.  Cardiovascular exam is regular rate and rhythm.  2/6 systolic ejection murmur. Abdominal exam nontender or distended. No masses palpated. Extremities show no edema. neuro grossly intact  ECG-sinus bradycardia at a rate of 45.  Personally reviewed  A/P  1 coronary artery disease-based on previous CT scan demonstrating coronary calcification.  Continue aspirin and statin.  Patient denies chest pain.  2 hypertension-blood pressure elevated.  Increase amlodipine to 10 mg daily and follow.  3 hyperlipidemia-continue statin.  4 palpitations-well-controlled.  However somewhat bradycardic.  Decrease metoprolol to 12.5 mg twice daily and follow heart rate.  Kirk Ruths, MD

## 2020-09-24 ENCOUNTER — Other Ambulatory Visit: Payer: Self-pay

## 2020-09-24 ENCOUNTER — Encounter: Payer: Self-pay | Admitting: Cardiology

## 2020-09-24 ENCOUNTER — Ambulatory Visit (INDEPENDENT_AMBULATORY_CARE_PROVIDER_SITE_OTHER): Payer: Medicare Other | Admitting: Cardiology

## 2020-09-24 VITALS — BP 144/70 | HR 45 | Ht 60.0 in | Wt 173.8 lb

## 2020-09-24 DIAGNOSIS — I10 Essential (primary) hypertension: Secondary | ICD-10-CM

## 2020-09-24 DIAGNOSIS — I2510 Atherosclerotic heart disease of native coronary artery without angina pectoris: Secondary | ICD-10-CM | POA: Diagnosis not present

## 2020-09-24 DIAGNOSIS — E782 Mixed hyperlipidemia: Secondary | ICD-10-CM | POA: Diagnosis not present

## 2020-09-24 MED ORDER — AMLODIPINE BESYLATE 10 MG PO TABS: 10.0000 mg | ORAL_TABLET | Freq: Every day | ORAL | 3 refills | Status: DC

## 2020-09-24 MED ORDER — METOPROLOL TARTRATE 25 MG PO TABS: 12.5000 mg | ORAL_TABLET | Freq: Two times a day (BID) | ORAL | 3 refills | Status: DC

## 2020-09-24 NOTE — Patient Instructions (Signed)
Medication Instructions:   INCREASE AMLODIPINE TO 10 MG ONCE DAILY= 2 OF THE 5 MG TABLETS ONCE DAILY  DECREASE METOPROLOL TO 12.5 MG TWICE DAILY= 1/2 OF THE 25 MG TABLET TWICE DAILY  *If you need a refill on your cardiac medications before your next appointment, please call your pharmacy*    Follow-Up: At [facility], you and your health needs are our priority.  As part of our continuing mission to provide you with exceptional heart care, we have created designated Provider Care Teams.  These Care Teams include your primary Cardiologist (physician) and Advanced Practice Providers (APPs -  Physician Assistants and Nurse Practitioners) who all work together to provide you with the care you need, when you need it.  We recommend signing up for the patient portal called "MyChart".  Sign up information is provided on this After Visit Summary.  MyChart is used to connect with patients for Virtual Visits (Telemedicine).  Patients are able to view lab/test results, encounter notes, upcoming appointments, etc.  Non-urgent messages can be sent to your provider as well.   To learn more about what you can do with MyChart, go to [email or web address].    Your next appointment:   12 month(s)  The format for your next appointment:   In Person  Provider:   Kirk Ruths, MD

## 2020-09-25 DIAGNOSIS — M9904 Segmental and somatic dysfunction of sacral region: Secondary | ICD-10-CM | POA: Diagnosis not present

## 2020-09-25 DIAGNOSIS — M62830 Muscle spasm of back: Secondary | ICD-10-CM | POA: Diagnosis not present

## 2020-09-25 DIAGNOSIS — M9901 Segmental and somatic dysfunction of cervical region: Secondary | ICD-10-CM | POA: Diagnosis not present

## 2020-09-25 DIAGNOSIS — M9903 Segmental and somatic dysfunction of lumbar region: Secondary | ICD-10-CM | POA: Diagnosis not present

## 2020-10-07 ENCOUNTER — Other Ambulatory Visit: Payer: Self-pay | Admitting: Family Medicine

## 2020-10-08 ENCOUNTER — Telehealth: Payer: Self-pay

## 2020-10-08 DIAGNOSIS — N3020 Other chronic cystitis without hematuria: Secondary | ICD-10-CM | POA: Diagnosis not present

## 2020-10-08 DIAGNOSIS — N3946 Mixed incontinence: Secondary | ICD-10-CM | POA: Diagnosis not present

## 2020-10-08 DIAGNOSIS — N952 Postmenopausal atrophic vaginitis: Secondary | ICD-10-CM | POA: Diagnosis not present

## 2020-10-08 DIAGNOSIS — R3914 Feeling of incomplete bladder emptying: Secondary | ICD-10-CM | POA: Diagnosis not present

## 2020-10-08 NOTE — Telephone Encounter (Signed)
Patient reports pain in ribs after a fall, wants to be seen.  Appointment scheduled tomorrow at 10:25 am with [facility].

## 2020-10-09 ENCOUNTER — Ambulatory Visit (INDEPENDENT_AMBULATORY_CARE_PROVIDER_SITE_OTHER): Payer: Medicare Other

## 2020-10-09 ENCOUNTER — Encounter: Payer: Self-pay | Admitting: Family

## 2020-10-09 ENCOUNTER — Other Ambulatory Visit: Payer: Self-pay

## 2020-10-09 ENCOUNTER — Ambulatory Visit (INDEPENDENT_AMBULATORY_CARE_PROVIDER_SITE_OTHER): Payer: Medicare Other | Admitting: Family

## 2020-10-09 VITALS — BP 117/52 | HR 53 | Temp 97.6°F | Ht 60.0 in | Wt 176.0 lb

## 2020-10-09 DIAGNOSIS — S60212A Contusion of left wrist, initial encounter: Secondary | ICD-10-CM | POA: Diagnosis not present

## 2020-10-09 DIAGNOSIS — M25512 Pain in left shoulder: Secondary | ICD-10-CM | POA: Diagnosis not present

## 2020-10-09 DIAGNOSIS — S20212A Contusion of left front wall of thorax, initial encounter: Secondary | ICD-10-CM | POA: Diagnosis not present

## 2020-10-09 DIAGNOSIS — R0781 Pleurodynia: Secondary | ICD-10-CM

## 2020-10-09 DIAGNOSIS — I2510 Atherosclerotic heart disease of native coronary artery without angina pectoris: Secondary | ICD-10-CM

## 2020-10-09 DIAGNOSIS — W19XXXA Unspecified fall, initial encounter: Secondary | ICD-10-CM

## 2020-10-09 DIAGNOSIS — Y92009 Unspecified place in unspecified non-institutional (private) residence as the place of occurrence of the external cause: Secondary | ICD-10-CM | POA: Diagnosis not present

## 2020-10-09 DIAGNOSIS — R0789 Other chest pain: Secondary | ICD-10-CM

## 2020-10-09 MED ORDER — NAPROXEN 250 MG PO TABS: 250.0000 mg | ORAL_TABLET | Freq: Two times a day (BID) | ORAL | 0 refills | Status: DC

## 2020-10-09 NOTE — Progress Notes (Signed)
Subjective:    Patient ID: Misty Smith, female    DOB: [birth year], 80 y.o.   MRN: [id number]  Chief Complaint  Patient presents with  . Fall    Friday hurt left rib pain   Pt presents to the office today with left rib pain. She reports she walking outside and tripped on a raised brick area and landed on left hand, shoulder, ribs. She reports the swelling in her hand has improved, but continues to have intermittent aching pain of 5 out 10. That is worse when she moves or coughs.  Fall The accident occurred 5 to 7 days ago. She landed on concrete. The point of impact was the left shoulder and left wrist. Pain location: left rib  The pain is at a severity of 5/10. The pain is moderate.      Review of Systems  All other systems reviewed and are negative.      Objective:   Physical Exam Vitals reviewed.  Constitutional:      General: She is not in acute distress.    Appearance: She is well-developed and well-nourished. She is obese.  HENT:     Head: Normocephalic and atraumatic.     Mouth/Throat:     Mouth: Oropharynx is clear and moist.  Eyes:     Pupils: Pupils are equal, round, and reactive to light.  Neck:     Thyroid: No thyromegaly.  Cardiovascular:     Rate and Rhythm: Normal rate and regular rhythm.     Pulses: Intact distal pulses.     Heart sounds: Normal heart sounds. No murmur heard.   Pulmonary:     Effort: Pulmonary effort is normal. No respiratory distress.     Breath sounds: Normal breath sounds. No wheezing.  Abdominal:     General: Bowel sounds are normal. There is no distension.     Palpations: Abdomen is soft.     Tenderness: There is no abdominal tenderness.  Musculoskeletal:        General: Tenderness present. No edema.     Cervical back: Normal range of motion and neck supple.     Comments: Pain in left ribs with movement and palpation  Skin:    General: Skin is warm and dry.  Neurological:     Mental Status: She is alert and oriented to  person, place, and time.     Cranial Nerves: No cranial nerve deficit.     Deep Tendon Reflexes: Reflexes are normal and symmetric.  Psychiatric:        Mood and Affect: Mood and affect normal.        Behavior: Behavior normal.        Thought Content: Thought content normal.        Judgment: Judgment normal.          BP (!) 117/52   Pulse (!) 53   Temp 97.6 F (36.4 C) (Temporal)   Ht 5' (1.524 m)   Wt 176 lb (79.8 kg)   SpO2 99%   BMI 34.37 kg/m   Assessment & Plan:  Misty Smith comes in today with chief complaint of Fall (Friday hurt left rib pain)   Diagnosis and orders addressed:  1. Fall in home, initial encounter - DG Ribs Unilateral W/Chest Left  2. Rib pain - naproxen (NAPROSYN) 250 MG tablet; Take 1 tablet (250 mg total) by mouth 2 (two) times daily with a meal.  Dispense: 20 tablet; Refill: 0  3. Contusion of  left wrist, initial encounter  4. Acute pain of left shoulder   5. Contusion of rib on left side, initial encounter Rest Ice Encouraged deep breathing and coughing Stabilize with pillow Take Naproxen with food for next 5 days, no other NSAID's  - naproxen (NAPROSYN) 250 MG tablet; Take 1 tablet (250 mg total) by mouth 2 (two) times daily with a meal.  Dispense: 20 tablet; Refill: 0    Evelina Dun, FNP

## 2020-10-09 NOTE — Patient Instructions (Signed)
Rib Contusion A rib contusion is a deep bruise on the rib area. Contusions are the result of a blunt trauma that causes bleeding and injury to the tissues under the skin. A rib contusion may involve bruising of the ribs and of the skin and muscles in the area. The skin over the contusion may turn blue, purple, or yellow. Minor injuries result in a painless contusion. More severe contusions may be painful and swollen for a few weeks. What are the causes? This condition is usually caused by a hard, direct hit to an area of the body. This often occurs while playing contact sports. What are the signs or symptoms? Symptoms of this condition include:  Swelling and redness of the injured area.  Discoloration of the injured area.  Tenderness and soreness of the injured area.  Pain with or without movement.  Pain when breathing in. How is this diagnosed? This condition may be diagnosed based on:  Your symptoms and medical history.  A physical exam.  Imaging tests--such as an X-ray, CT scan, or MRI--to determine if there were internal injuries or broken bones (fractures). How is this treated? This condition may be treated with:  Rest. This is often the best treatment for a rib contusion.  Ice packs. This reduces swelling and inflammation.  Deep-breathing exercises. These may be recommended to reduce the risk for lung collapse and pneumonia.  Medicines. Over-the-counter or prescription medicines may be given to control pain.  Injection of a numbing medicine around the nerve near your injury (nerve block). Follow these instructions at home: Medicines  Take over-the-counter and prescription medicines only as told by your health care provider.  Ask your health care provider if the medicine prescribed to you: ? Requires you to avoid driving or using machinery. ? Can cause constipation. You may need to take these actions to prevent or treat constipation:  Drink enough fluid to keep your  urine pale yellow.  Take over-the-counter or prescription medicines.  Eat foods that are high in fiber, such as beans, whole grains, and fresh fruits and vegetables.  Limit foods that are high in fat and processed sugars, such as fried or sweet foods. Managing pain, stiffness, and swelling If directed, put ice on the injured area. To do this:  Put ice in a plastic bag.  Place a towel between your skin and the bag.  Leave the ice on for 20 minutes, 2-3 times a day.  Remove the ice if your skin turns bright red. This is very important. If you cannot feel pain, heat, or cold, you have a greater risk of damage to the area.   Activity  Rest the injured area.  Avoid strenuous activity and any activities or movements that cause pain. Be careful during activities, and avoid bumping the injured area.  Do not lift anything that is heavier than 5 lb (2.3 kg), or the limit that you are told, until your health care provider says that it is safe. General instructions  Do not use any products that contain nicotine or tobacco, such as cigarettes, e-cigarettes, and chewing tobacco. These can delay healing. If you need help quitting, ask your health care provider.  Do deep-breathing exercises as told by your health care provider.  If you were given an incentive spirometer, use it every 1-2 hours while you are awake, or as recommended by your health care provider. This device measures how well you are filling your lungs with each breath.  Keep all follow-up visits. This is   important.   Contact a health care provider if you have:  Increased bruising or swelling.  Pain that is not controlled with treatment.  A fever. Get help right away if you:  Have difficulty breathing or shortness of breath.  Develop a continual cough, or you cough up thick or bloody mucus from your lungs (sputum).  Feel nauseous or you vomit.  Have pain in your abdomen. These symptoms may represent a serious problem  that is an emergency. Do not wait to see if the symptoms will go away. Get medical help right away. Call your local emergency services (911 in the [place] Do not drive yourself to the hospital. Summary  A rib contusion is a deep bruise on your rib area. Contusions are the result of a blunt trauma that causes bleeding and injury to the tissues under the skin.  The skin over the contusion may turn blue, purple, or yellow. Minor injuries may cause a painless contusion. More severe contusions may be painful and swollen for a few weeks.  Rest the injured area. Avoid strenuous activity and any activities or movements that cause pain. This information is not intended to replace advice given to you by your health care provider. Make sure you discuss any questions you have with your health care provider. Document Revised: 11/23/2019 Document Reviewed: 11/23/2019 Elsevier Patient Education  [place].

## 2020-10-10 ENCOUNTER — Other Ambulatory Visit: Payer: Self-pay | Admitting: Family

## 2020-10-10 MED ORDER — DOXYCYCLINE HYCLATE 100 MG PO TABS: 100.0000 mg | ORAL_TABLET | Freq: Two times a day (BID) | ORAL | 0 refills | Status: DC

## 2020-11-01 ENCOUNTER — Ambulatory Visit (INDEPENDENT_AMBULATORY_CARE_PROVIDER_SITE_OTHER): Payer: Medicare Other

## 2020-11-01 ENCOUNTER — Encounter: Payer: Self-pay | Admitting: Family

## 2020-11-01 ENCOUNTER — Other Ambulatory Visit: Payer: Self-pay

## 2020-11-01 ENCOUNTER — Other Ambulatory Visit: Payer: Self-pay | Admitting: Family

## 2020-11-01 ENCOUNTER — Ambulatory Visit (INDEPENDENT_AMBULATORY_CARE_PROVIDER_SITE_OTHER): Payer: Medicare Other | Admitting: Family

## 2020-11-01 VITALS — BP 165/59 | HR 57 | Temp 98.0°F | Ht 60.0 in | Wt 174.0 lb

## 2020-11-01 DIAGNOSIS — I2510 Atherosclerotic heart disease of native coronary artery without angina pectoris: Secondary | ICD-10-CM | POA: Diagnosis not present

## 2020-11-01 DIAGNOSIS — S2232XD Fracture of one rib, left side, subsequent encounter for fracture with routine healing: Secondary | ICD-10-CM

## 2020-11-01 DIAGNOSIS — R9389 Abnormal findings on diagnostic imaging of other specified body structures: Secondary | ICD-10-CM

## 2020-11-01 DIAGNOSIS — J90 Pleural effusion, not elsewhere classified: Secondary | ICD-10-CM | POA: Diagnosis not present

## 2020-11-01 DIAGNOSIS — R918 Other nonspecific abnormal finding of lung field: Secondary | ICD-10-CM

## 2020-11-01 NOTE — Progress Notes (Signed)
Subjective:    Patient ID: Misty Smith, female    DOB: 1940-12-09, 80 y.o.   MRN: [id number]  Chief Complaint  Patient presents with   Fall    Follow up     HPI Pt presents to the office today to recheck left rib pain. She was seen on 10/09/20 after a fall. She had a chest x-ray that showed, "Multiple chronic left rib fractures. Possible acute fracture left anterior 6 rib. Small left effusion may be posttraumatic.  Patchy right upper lobe density possible pneumonia or lung nodule. Followup PA and lateral chest X-ray is recommended in 3-4 weeks following trial of antibiotic therapy to ensure resolution and exclude underlying malignancy."  She was treated with doxycyline 100 mg for 10 days. Denies any fever, new SOB, or cough.   She reports her pain is greatly improved and is 2 out 10. Worse when she takes a deep breath or moving it is worse.   Review of Systems  All other systems reviewed and are negative.      Objective:   Physical Exam Vitals reviewed.  Constitutional:      General: She is not in acute distress.    Appearance: She is well-developed and well-nourished.  HENT:     Head: Normocephalic and atraumatic.     Mouth/Throat:     Mouth: Oropharynx is clear and moist.  Eyes:     Pupils: Pupils are equal, round, and reactive to light.  Neck:     Thyroid: No thyromegaly.  Cardiovascular:     Rate and Rhythm: Normal rate and regular rhythm.     Pulses: Intact distal pulses.     Heart sounds: Normal heart sounds. No murmur heard.   Pulmonary:     Effort: Pulmonary effort is normal. No respiratory distress.     Breath sounds: Normal breath sounds. No wheezing.  Abdominal:     General: Bowel sounds are normal. There is no distension.     Palpations: Abdomen is soft.     Tenderness: There is no abdominal tenderness.  Musculoskeletal:        General: No tenderness or edema. Normal range of motion.     Cervical back: Normal range of motion and neck  supple.  Skin:    General: Skin is warm and dry.  Neurological:     Mental Status: She is alert and oriented to person, place, and time.     Cranial Nerves: No cranial nerve deficit.     Deep Tendon Reflexes: Reflexes are normal and symmetric.  Psychiatric:        Mood and Affect: Mood and affect normal.        Behavior: Behavior normal.        Thought Content: Thought content normal.        Judgment: Judgment normal.     BP (!) 165/59    Pulse (!) 57    Temp 98 F (36.7 C) (Temporal)    Ht 5' (1.524 m)    Wt 174 lb (78.9 kg)    SpO2 96%    BMI 33.98 kg/m        Assessment & Plan:  Misty Smith comes in today with chief complaint of Fall (Follow up)   Diagnosis and orders addressed:  1. Closed fracture of one rib of left side with routine healing, subsequent encounter - DG Chest 2 View; Future  2. Abnormal chest x-ray - DG Chest 2 View; Future   Lung sounds improved  on exam  Health Maintenance reviewed Diet and exercise encouraged  Follow up plan: Keep chronic follow up with PCP, x-ray pending    Evelina Dun, FNP

## 2020-11-01 NOTE — Patient Instructions (Signed)

## 2020-11-19 ENCOUNTER — Other Ambulatory Visit: Payer: Self-pay | Admitting: Cardiovascular Disease

## 2020-11-19 ENCOUNTER — Other Ambulatory Visit: Payer: Self-pay | Admitting: Family Medicine

## 2020-11-19 DIAGNOSIS — M9904 Segmental and somatic dysfunction of sacral region: Secondary | ICD-10-CM | POA: Diagnosis not present

## 2020-11-19 DIAGNOSIS — M62830 Muscle spasm of back: Secondary | ICD-10-CM | POA: Diagnosis not present

## 2020-11-19 DIAGNOSIS — M9901 Segmental and somatic dysfunction of cervical region: Secondary | ICD-10-CM | POA: Diagnosis not present

## 2020-11-19 DIAGNOSIS — M9903 Segmental and somatic dysfunction of lumbar region: Secondary | ICD-10-CM | POA: Diagnosis not present

## 2020-11-23 ENCOUNTER — Telehealth: Payer: Self-pay | Admitting: Cardiology

## 2020-11-23 ENCOUNTER — Other Ambulatory Visit: Payer: Self-pay

## 2020-11-23 MED ORDER — AMLODIPINE BESYLATE 10 MG PO TABS: 10.0000 mg | ORAL_TABLET | Freq: Every day | ORAL | 3 refills | Status: DC

## 2020-11-23 NOTE — Telephone Encounter (Signed)
*  STAT* If patient is at the pharmacy, call can be transferred to refill team.   1. Which medications need to be refilled? (please list name of each medication and dose if known) amLODipine (NORVASC) 10 MG tablet  2. Which pharmacy/location (including street and city if local pharmacy) is medication to be sent to? [id number] - [place], [place] [address]  3. Do they need a 30 day or 90 day supply? 90  Patient out of medication Takes 5mg  2x daily per patient.

## 2020-11-26 DIAGNOSIS — M9901 Segmental and somatic dysfunction of cervical region: Secondary | ICD-10-CM | POA: Diagnosis not present

## 2020-11-26 DIAGNOSIS — M62830 Muscle spasm of back: Secondary | ICD-10-CM | POA: Diagnosis not present

## 2020-11-26 DIAGNOSIS — M9904 Segmental and somatic dysfunction of sacral region: Secondary | ICD-10-CM | POA: Diagnosis not present

## 2020-11-26 DIAGNOSIS — M9903 Segmental and somatic dysfunction of lumbar region: Secondary | ICD-10-CM | POA: Diagnosis not present

## 2020-11-27 ENCOUNTER — Other Ambulatory Visit: Payer: Medicare Other

## 2020-11-27 ENCOUNTER — Other Ambulatory Visit: Payer: Self-pay

## 2020-11-27 DIAGNOSIS — E782 Mixed hyperlipidemia: Secondary | ICD-10-CM

## 2020-11-27 DIAGNOSIS — E7800 Pure hypercholesterolemia, unspecified: Secondary | ICD-10-CM

## 2020-11-27 DIAGNOSIS — R7303 Prediabetes: Secondary | ICD-10-CM

## 2020-11-27 LAB — BAYER DCA HB A1C WAIVED: HB A1C (BAYER DCA - WAIVED): 6.1 % (ref <7.0)

## 2020-11-28 ENCOUNTER — Ambulatory Visit (HOSPITAL_COMMUNITY)
Admission: RE | Admit: 2020-11-28 | Discharge: 2020-11-28 | Disposition: A | Discharge status: Home / Self Care | Payer: Medicare Other | Source: Ambulatory Visit | Attending: Family | Admitting: Family

## 2020-11-28 DIAGNOSIS — K449 Diaphragmatic hernia without obstruction or gangrene: Secondary | ICD-10-CM | POA: Diagnosis not present

## 2020-11-28 DIAGNOSIS — S2232XA Fracture of one rib, left side, initial encounter for closed fracture: Secondary | ICD-10-CM | POA: Diagnosis not present

## 2020-11-28 DIAGNOSIS — R918 Other nonspecific abnormal finding of lung field: Secondary | ICD-10-CM | POA: Insufficient documentation

## 2020-11-28 DIAGNOSIS — I700 Atherosclerosis of aorta: Secondary | ICD-10-CM | POA: Diagnosis not present

## 2020-11-28 DIAGNOSIS — I2510 Atherosclerotic heart disease of native coronary artery without angina pectoris: Secondary | ICD-10-CM | POA: Diagnosis not present

## 2020-11-28 LAB — CBC WITH DIFFERENTIAL/PLATELET
Basophils Absolute: 0.1 10*3/uL (ref 0.0–0.2)
Basos: 1 %
EOS (ABSOLUTE): 0.2 10*3/uL (ref 0.0–0.4)
Eos: 3 %
Hematocrit: 35.3 % (ref 34.0–46.6)
Hemoglobin: 12 g/dL (ref 11.1–15.9)
Immature Grans (Abs): 0 10*3/uL (ref 0.0–0.1)
Immature Granulocytes: 0 %
Lymphocytes Absolute: 1.8 10*3/uL (ref 0.7–3.1)
Lymphs: 30 %
MCH: 31.9 pg (ref 26.6–33.0)
MCHC: 34 g/dL (ref 31.5–35.7)
MCV: 94 fL (ref 79–97)
Monocytes Absolute: 0.7 10*3/uL (ref 0.1–0.9)
Monocytes: 11 %
Neutrophils Absolute: 3.3 10*3/uL (ref 1.4–7.0)
Neutrophils: 55 %
Platelets: 229 10*3/uL (ref 150–450)
RBC: 3.76 x10E6/uL — ABNORMAL LOW (ref 3.77–5.28)
RDW: 12.5 % (ref 11.7–15.4)
WBC: 6 10*3/uL (ref 3.4–10.8)

## 2020-11-28 LAB — CMP14+EGFR
ALT: 25 IU/L (ref 0–32)
AST: 33 IU/L (ref 0–40)
Albumin/Globulin Ratio: 1.7 (ref 1.2–2.2)
Albumin: 4.3 g/dL (ref 3.6–4.6)
Alkaline Phosphatase: 83 IU/L (ref 44–121)
BUN/Creatinine Ratio: 20 (ref 12–28)
BUN: 15 mg/dL (ref 8–27)
Bilirubin Total: 0.3 mg/dL (ref 0.0–1.2)
CO2: 23 mmol/L (ref 20–29)
Calcium: 9.4 mg/dL (ref 8.7–10.3)
Chloride: 96 mmol/L (ref 96–106)
Creatinine, Ser: 0.75 mg/dL (ref 0.57–1.00)
Globulin, Total: 2.6 g/dL (ref 1.5–4.5)
Glucose: 101 mg/dL — ABNORMAL HIGH (ref 65–99)
Potassium: 4.8 mmol/L (ref 3.5–5.2)
Sodium: 137 mmol/L (ref 134–144)
Total Protein: 6.9 g/dL (ref 6.0–8.5)
eGFR: 80 mL/min/{1.73_m2} (ref >59)

## 2020-11-28 LAB — LIPID PANEL
Chol/HDL Ratio: 2.6 ratio (ref 0.0–4.4)
Cholesterol, Total: 150 mg/dL (ref 100–199)
HDL: 57 mg/dL (ref >39)
LDL Chol Calc (NIH): 70 mg/dL (ref 0–99)
Triglycerides: 132 mg/dL (ref 0–149)
VLDL Cholesterol Cal: 23 mg/dL (ref 5–40)

## 2020-11-29 ENCOUNTER — Other Ambulatory Visit: Payer: Self-pay | Admitting: Family

## 2020-11-29 MED ORDER — AZITHROMYCIN 250 MG PO TABS: ORAL_TABLET | ORAL | 0 refills | Status: DC

## 2020-11-30 ENCOUNTER — Ambulatory Visit (INDEPENDENT_AMBULATORY_CARE_PROVIDER_SITE_OTHER): Payer: Medicare Other

## 2020-11-30 ENCOUNTER — Other Ambulatory Visit: Payer: Self-pay

## 2020-11-30 ENCOUNTER — Encounter: Payer: Self-pay | Admitting: Family Medicine

## 2020-11-30 ENCOUNTER — Ambulatory Visit (INDEPENDENT_AMBULATORY_CARE_PROVIDER_SITE_OTHER): Payer: Medicare Other | Admitting: Family Medicine

## 2020-11-30 VITALS — BP 114/46 | HR 52 | Ht 60.0 in | Wt 174.0 lb

## 2020-11-30 DIAGNOSIS — E7800 Pure hypercholesterolemia, unspecified: Secondary | ICD-10-CM | POA: Diagnosis not present

## 2020-11-30 DIAGNOSIS — I10 Essential (primary) hypertension: Secondary | ICD-10-CM

## 2020-11-30 DIAGNOSIS — I2510 Atherosclerotic heart disease of native coronary artery without angina pectoris: Secondary | ICD-10-CM | POA: Diagnosis not present

## 2020-11-30 DIAGNOSIS — R7303 Prediabetes: Secondary | ICD-10-CM | POA: Diagnosis not present

## 2020-11-30 DIAGNOSIS — A310 Pulmonary mycobacterial infection: Secondary | ICD-10-CM

## 2020-11-30 DIAGNOSIS — Z780 Asymptomatic menopausal state: Secondary | ICD-10-CM

## 2020-11-30 NOTE — Progress Notes (Signed)
BP (!) 114/46   Pulse (!) 52   Ht 5' (1.524 m)   Wt 174 lb (78.9 kg)   SpO2 99%   BMI 33.98 kg/m    Subjective:   Patient ID: Misty Smith, female    DOB: 12/29/1940, 80 y.o.   MRN: [id number]  HPI: OSA Misty Smith is a 80 y.o. female presenting on 11/30/2020 for Medical Management of Chronic Issues and Prediabetes   HPI Fatigue Patient is coming complaining of fatigue and down energy.  They did adjust her blood pressure medications because her heart rate was low, her thyroid is still somewhat low and her blood pressure is on the lower side today, I recommended for her to check it over the next couple weeks and we may have to reduce the amlodipine back down to a lower dose.  Type 2 diabetes mellitus Patient comes in today for recheck of his diabetes. Patient has been currently taking Actos. Patient is currently on an ACE inhibitor/ARB. Patient has seen an ophthalmologist this year. Patient denies any issues with their feet. The symptom started onset as an adult hyperlipidemia and hypertension ARE RELATED TO DM   Hyperlipidemia Patient is coming in for recheck of his hyperlipidemia. The patient is currently taking atorvastatin and fish oil. They deny any issues with myalgias or history of liver damage from it. They deny any focal numbness or weakness or chest pain.   Hypertension Patient is currently on amlodipine and metoprolol and lisinopril and hydrochlorothiazide, and their blood pressure today is 114/46. Patient denies any lightheadedness or dizziness. Patient denies headaches, blurred vision, chest pains, shortness of breath, or weakness. Denies any side effects from medication and is content with current medication.     Relevant past medical, surgical, family and social history reviewed and updated as indicated. Interim medical history since our last visit reviewed. Allergies and medications reviewed and updated.  Review of Systems  Constitutional: Positive for fatigue.  Negative for chills and fever.  HENT: Negative for congestion, ear discharge and ear pain.   Eyes: Negative for visual disturbance.  Respiratory: Negative for chest tightness and shortness of breath.   Cardiovascular: Negative for chest pain and leg swelling.  Genitourinary: Negative for difficulty urinating and dysuria.  Musculoskeletal: Negative for back pain and gait problem.  Skin: Negative for rash.  Neurological: Negative for dizziness, light-headedness and headaches.  Psychiatric/Behavioral: Negative for agitation and behavioral problems.  All other systems reviewed and are negative.   Per HPI unless specifically indicated above   Allergies as of 11/30/2020      Reactions   Ciprofloxacin Rash   Codeine Rash   Iohexol     Desc: PT HAD HIVES AND WAS GIVEN BENEDRYL PO BY NURSE AND OBSERVED IN NURSES STATION AND RELEASED WITHOUT AND OTHER COMPLICATIONS SHEET SCANNED UNDER NURSES NOTES///ON 12/01/11 PT HAD ANOTHER CT WITH PREMEDS MINUS THE BENADRYL. SHE HAD A DELAYED REACTION AFTER WITH THROAT TIGHTNESS AND SOB, JB  12/07/12   Penicillins Hives   Sulfonamide Derivatives Hives   Asa [aspirin] Other (See Comments)   Large amounts causes stomach pain   Keflex [cephalexin] Nausea And Vomiting   Morphine And Related Rash      Medication List       Accurate as of November 30, 2020  8:40 AM. If you have any questions, ask your nurse or doctor.        STOP taking these medications   azithromycin 250 MG tablet Commonly known as: ZITHROMAX Stopped by:  Fransisca Kaufmann Naly Schwanz, MD   doxycycline 100 MG tablet Commonly known as: VIBRA-TABS Stopped by: Worthy Rancher, MD   naproxen 250 MG tablet Commonly known as: Naprosyn Stopped by: Worthy Rancher, MD     TAKE these medications   amLODipine 10 MG tablet Commonly known as: NORVASC Take 1 tablet (10 mg total) by mouth daily.   aspirin EC 81 MG tablet Take 1 tablet (81 mg total) by mouth daily.   atorvastatin 80 MG  tablet Commonly known as: LIPITOR TAKE AS DIRECTED   Calcium Carb-Cholecalciferol 600-800 MG-UNIT Tabs Take 1 tablet by mouth every evening.   cholecalciferol 1000 units tablet Commonly known as: VITAMIN D Take 2,000 Units by mouth daily.   escitalopram 20 MG tablet Commonly known as: LEXAPRO Take 1 tablet (20 mg total) by mouth daily.   esomeprazole 40 MG capsule Commonly known as: NEXIUM Take 1 capsule (40 mg total) by mouth daily.   estradiol 0.1 MG/GM vaginal cream Commonly known as: ESTRACE Place vaginally.   hydrochlorothiazide 12.5 MG tablet Commonly known as: HYDRODIURIL Take 1 tablet (12.5 mg total) by mouth daily.   lisinopril 40 MG tablet Commonly known as: ZESTRIL TAKE 1 TABLET BY MOUTH EVERY DAY   metoprolol tartrate 25 MG tablet Commonly known as: LOPRESSOR Take 0.5 tablets (12.5 mg total) by mouth 2 (two) times daily.   multivitamin tablet Take 1 tablet by mouth every morning.   omega-3 acid ethyl esters 1 g capsule Commonly known as: LOVAZA Take 2 capsules (2 g total) by mouth 2 (two) times daily.   pioglitazone 30 MG tablet Commonly known as: ACTOS TAKE 1 TABLET BY MOUTH EVERY DAY        Objective:   BP (!) 114/46   Pulse (!) 52   Ht 5' (1.524 m)   Wt 174 lb (78.9 kg)   SpO2 99%   BMI 33.98 kg/m   Wt Readings from Last 3 Encounters:  11/30/20 174 lb (78.9 kg)  11/01/20 174 lb (78.9 kg)  10/09/20 176 lb (79.8 kg)    Physical Exam Vitals and nursing note reviewed.  Constitutional:      General: She is not in acute distress.    Appearance: She is well-developed. She is not diaphoretic.  Eyes:     Conjunctiva/sclera: Conjunctivae normal.  Cardiovascular:     Rate and Rhythm: Normal rate and regular rhythm.     Heart sounds: Normal heart sounds. No murmur heard.   Pulmonary:     Effort: Pulmonary effort is normal. No respiratory distress.     Breath sounds: Normal breath sounds. No wheezing.  Musculoskeletal:        General:  No tenderness. Normal range of motion.  Skin:    General: Skin is warm and dry.     Findings: No rash.  Neurological:     Mental Status: She is alert and oriented to person, place, and time.     Coordination: Coordination normal.  Psychiatric:        Behavior: Behavior normal.     Results for orders placed or performed in visit on 11/27/20  Bayer DCA Hb A1c Waived  Result Value Ref Range   HB A1C (BAYER DCA - WAIVED) 6.1 <7.0 %  CBC with Differential/Platelet  Result Value Ref Range   WBC 6.0 3.4 - 10.8 x10E3/uL   RBC 3.76 (L) 3.77 - 5.28 x10E6/uL   Hemoglobin 12.0 11.1 - 15.9 g/dL   Hematocrit 35.3 34.0 - 46.6 %  MCV 94 79 - 97 fL   MCH 31.9 26.6 - 33.0 pg   MCHC 34.0 31.5 - 35.7 g/dL   RDW 12.5 11.7 - 15.4 %   Platelets 229 150 - 450 x10E3/uL   Neutrophils 55 Not Estab. %   Lymphs 30 Not Estab. %   Monocytes 11 Not Estab. %   Eos 3 Not Estab. %   Basos 1 Not Estab. %   Neutrophils Absolute 3.3 1.4 - 7.0 x10E3/uL   Lymphocytes Absolute 1.8 0.7 - 3.1 x10E3/uL   Monocytes Absolute 0.7 0.1 - 0.9 x10E3/uL   EOS (ABSOLUTE) 0.2 0.0 - 0.4 x10E3/uL   Basophils Absolute 0.1 0.0 - 0.2 x10E3/uL   Immature Granulocytes 0 Not Estab. %   Immature Grans (Abs) 0.0 0.0 - 0.1 x10E3/uL  CMP14+EGFR  Result Value Ref Range   Glucose 101 (H) 65 - 99 mg/dL   BUN 15 8 - 27 mg/dL   Creatinine, Ser 0.75 0.57 - 1.00 mg/dL   eGFR 80 >59 mL/min/1.73   BUN/Creatinine Ratio 20 12 - 28   Sodium 137 134 - 144 mmol/L   Potassium 4.8 3.5 - 5.2 mmol/L   Chloride 96 96 - 106 mmol/L   CO2 23 20 - 29 mmol/L   Calcium 9.4 8.7 - 10.3 mg/dL   Total Protein 6.9 6.0 - 8.5 g/dL   Albumin 4.3 3.6 - 4.6 g/dL   Globulin, Total 2.6 1.5 - 4.5 g/dL   Albumin/Globulin Ratio 1.7 1.2 - 2.2   Bilirubin Total 0.3 0.0 - 1.2 mg/dL   Alkaline Phosphatase 83 44 - 121 IU/L   AST 33 0 - 40 IU/L   ALT 25 0 - 32 IU/L  Lipid panel  Result Value Ref Range   Cholesterol, Total 150 100 - 199 mg/dL   Triglycerides 132 0  - 149 mg/dL   HDL 57 >39 mg/dL   VLDL Cholesterol Cal 23 5 - 40 mg/dL   LDL Chol Calc (NIH) 70 0 - 99 mg/dL   Chol/HDL Ratio 2.6 0.0 - 4.4 ratio    Assessment & Plan:   Problem List Items Addressed This Visit      Cardiovascular and Mediastinum   Essential hypertension     Other   Pre-diabetes - Primary   Relevant Orders   TSH   Hyperlipidemia    Other Visit Diagnoses    Postmenopausal       Relevant Orders   DG [facility] DEXA   MAI (mycobacterium avium-intracellulare) ([place])       Relevant Orders   Ambulatory referral to Pulmonology      Continue current medication, will add on thyroid to see if that is a cause of her fatigue.  She had a recent CT scan that showed a possible MAI nodule and will refer to pulmonology. Follow up plan: Return in about 4 months (around 04/01/2021), or if symptoms worsen or fail to improve, for Diabetes hypertension recheck.  Counseling provided for all of the vaccine components Orders Placed This Encounter  Procedures  . DG [facility] DEXA  . TSH  . Ambulatory referral to [place], MD [facility] 11/30/2020, 8:40 AM

## 2020-12-01 LAB — TSH: TSH: 1.51 u[IU]/mL (ref 0.450–4.500)

## 2020-12-01 LAB — SPECIMEN STATUS REPORT

## 2020-12-03 DIAGNOSIS — M9903 Segmental and somatic dysfunction of lumbar region: Secondary | ICD-10-CM | POA: Diagnosis not present

## 2020-12-03 DIAGNOSIS — M9904 Segmental and somatic dysfunction of sacral region: Secondary | ICD-10-CM | POA: Diagnosis not present

## 2020-12-03 DIAGNOSIS — M62830 Muscle spasm of back: Secondary | ICD-10-CM | POA: Diagnosis not present

## 2020-12-03 DIAGNOSIS — M9901 Segmental and somatic dysfunction of cervical region: Secondary | ICD-10-CM | POA: Diagnosis not present

## 2020-12-04 ENCOUNTER — Telehealth: Payer: Self-pay

## 2020-12-04 NOTE — Telephone Encounter (Signed)
Pt aware of all labs

## 2020-12-10 DIAGNOSIS — M9901 Segmental and somatic dysfunction of cervical region: Secondary | ICD-10-CM | POA: Diagnosis not present

## 2020-12-10 DIAGNOSIS — M9904 Segmental and somatic dysfunction of sacral region: Secondary | ICD-10-CM | POA: Diagnosis not present

## 2020-12-10 DIAGNOSIS — M9903 Segmental and somatic dysfunction of lumbar region: Secondary | ICD-10-CM | POA: Diagnosis not present

## 2020-12-10 DIAGNOSIS — M62830 Muscle spasm of back: Secondary | ICD-10-CM | POA: Diagnosis not present

## 2020-12-24 ENCOUNTER — Ambulatory Visit (INDEPENDENT_AMBULATORY_CARE_PROVIDER_SITE_OTHER): Payer: Medicare Other | Admitting: Family Medicine

## 2020-12-24 ENCOUNTER — Encounter: Payer: Self-pay | Admitting: Family Medicine

## 2020-12-24 ENCOUNTER — Other Ambulatory Visit: Payer: Self-pay

## 2020-12-24 VITALS — BP 140/58 | HR 52 | Ht 60.0 in | Wt 174.0 lb

## 2020-12-24 DIAGNOSIS — I2510 Atherosclerotic heart disease of native coronary artery without angina pectoris: Secondary | ICD-10-CM

## 2020-12-24 DIAGNOSIS — R5383 Other fatigue: Secondary | ICD-10-CM

## 2020-12-24 DIAGNOSIS — J189 Pneumonia, unspecified organism: Secondary | ICD-10-CM

## 2020-12-24 NOTE — Progress Notes (Signed)
BP (!) 140/58   Pulse (!) 52   Ht 5' (1.524 m)   Wt 174 lb (78.9 kg)   SpO2 97%   BMI 33.98 kg/m    Subjective:   Patient ID: Misty Smith, female    DOB: Dec 20, 1940, 80 y.o.   MRN: [id number]  HPI: Misty Smith is a 80 y.o. female presenting on 12/24/2020 for Fatigue (2-4 week follow up. Some better but still tired)   HPI Patient is coming in for fatigue and wound follow-up. She had a lot of blood work for fatigue last time in office and back normal but did show some possible mild pneumonia on her x-ray, at the time was concerning for MAI on the CT scan as well.  We will do repeat x-ray.  Patient still gets somewhat winded on exertion but feels like it is improving as well.  She says stairs bother her and other things that she has to exert herself bother her.  She denies any cough or fevers or chills.  She is currently on an antibiotic for dental infection.  Her fatigue is improved as well.  Relevant past medical, surgical, family and social history reviewed and updated as indicated. Interim medical history since our last visit reviewed. Allergies and medications reviewed and updated.  Review of Systems  Constitutional: Positive for fatigue. Negative for chills and fever.  HENT: Negative for congestion, ear discharge and ear pain.   Eyes: Negative for visual disturbance.  Respiratory: Positive for shortness of breath. Negative for cough, chest tightness and wheezing.   Cardiovascular: Negative for chest pain and leg swelling.  Genitourinary: Negative for difficulty urinating and dysuria.  Musculoskeletal: Negative for back pain and gait problem.  Skin: Negative for rash.  Neurological: Negative for light-headedness and headaches.  Psychiatric/Behavioral: Negative for agitation and behavioral problems.  All other systems reviewed and are negative.   Per HPI unless specifically indicated above   Allergies as of 12/24/2020      Reactions   Ciprofloxacin Rash   Codeine  Rash   Iohexol     Desc: PT HAD HIVES AND WAS GIVEN BENEDRYL PO BY NURSE AND OBSERVED IN NURSES STATION AND RELEASED WITHOUT AND OTHER COMPLICATIONS SHEET SCANNED UNDER NURSES NOTES///ON 12/01/11 PT HAD ANOTHER CT WITH PREMEDS MINUS THE BENADRYL. SHE HAD A DELAYED REACTION AFTER WITH THROAT TIGHTNESS AND SOB, JB  12/07/12   Penicillins Hives   Sulfonamide Derivatives Hives   Asa [aspirin] Other (See Comments)   Large amounts causes stomach pain   Keflex [cephalexin] Nausea And Vomiting   Morphine And Related Rash      Medication List       Accurate as of December 24, 2020  9:35 AM. If you have any questions, ask your nurse or doctor.        amLODipine 10 MG tablet Commonly known as: NORVASC Take 1 tablet (10 mg total) by mouth daily.   aspirin EC 81 MG tablet Take 1 tablet (81 mg total) by mouth daily.   atorvastatin 80 MG tablet Commonly known as: LIPITOR TAKE AS DIRECTED   Calcium Carb-Cholecalciferol 600-800 MG-UNIT Tabs Take 1 tablet by mouth every evening.   cholecalciferol 1000 units tablet Commonly known as: VITAMIN D Take 2,000 Units by mouth daily.   escitalopram 20 MG tablet Commonly known as: LEXAPRO Take 1 tablet (20 mg total) by mouth daily.   esomeprazole 40 MG capsule Commonly known as: NEXIUM Take 1 capsule (40 mg total) by mouth daily.  estradiol 0.1 MG/GM vaginal cream Commonly known as: ESTRACE Place vaginally.   hydrochlorothiazide 12.5 MG tablet Commonly known as: HYDRODIURIL Take 1 tablet (12.5 mg total) by mouth daily.   lisinopril 40 MG tablet Commonly known as: ZESTRIL TAKE 1 TABLET BY MOUTH EVERY DAY   metoprolol tartrate 25 MG tablet Commonly known as: LOPRESSOR Take 0.5 tablets (12.5 mg total) by mouth 2 (two) times daily.   multivitamin tablet Take 1 tablet by mouth every morning.   omega-3 acid ethyl esters 1 g capsule Commonly known as: LOVAZA Take 2 capsules (2 g total) by mouth 2 (two) times daily.   pioglitazone 30 MG  tablet Commonly known as: ACTOS TAKE 1 TABLET BY MOUTH EVERY DAY        Objective:   BP (!) 140/58   Pulse (!) 52   Ht 5' (1.524 m)   Wt 174 lb (78.9 kg)   SpO2 97%   BMI 33.98 kg/m   Wt Readings from Last 3 Encounters:  12/24/20 174 lb (78.9 kg)  11/30/20 174 lb (78.9 kg)  11/01/20 174 lb (78.9 kg)    Physical Exam Vitals and nursing note reviewed.  Constitutional:      General: She is not in acute distress.    Appearance: She is well-developed. She is not diaphoretic.  Eyes:     Conjunctiva/sclera: Conjunctivae normal.  Cardiovascular:     Rate and Rhythm: Normal rate and regular rhythm.     Heart sounds: Normal heart sounds. No murmur heard.   Pulmonary:     Effort: Pulmonary effort is normal. No respiratory distress.     Breath sounds: Normal breath sounds. No wheezing.  Musculoskeletal:        General: No tenderness. Normal range of motion.  Skin:    General: Skin is warm and dry.     Findings: No rash.  Neurological:     Mental Status: She is alert and oriented to person, place, and time.     Coordination: Coordination normal.  Psychiatric:        Behavior: Behavior normal.     Results for orders placed or performed in visit on 11/27/20  Bayer DCA Hb A1c Waived  Result Value Ref Range   HB A1C (BAYER DCA - WAIVED) 6.1 <7.0 %  CBC with Differential/Platelet  Result Value Ref Range   WBC 6.0 3.4 - 10.8 x10E3/uL   RBC 3.76 (L) 3.77 - 5.28 x10E6/uL   Hemoglobin 12.0 11.1 - 15.9 g/dL   Hematocrit 66.4 83.0 - 46.6 %   MCV 94 79 - 97 fL   MCH 31.9 26.6 - 33.0 pg   MCHC 34.0 31.5 - 35.7 g/dL   RDW 32.2 01.9 - 92.4 %   Platelets 229 150 - 450 x10E3/uL   Neutrophils 55 Not Estab. %   Lymphs 30 Not Estab. %   Monocytes 11 Not Estab. %   Eos 3 Not Estab. %   Basos 1 Not Estab. %   Neutrophils Absolute 3.3 1.4 - 7.0 x10E3/uL   Lymphocytes Absolute 1.8 0.7 - 3.1 x10E3/uL   Monocytes Absolute 0.7 0.1 - 0.9 x10E3/uL   EOS (ABSOLUTE) 0.2 0.0 - 0.4  x10E3/uL   Basophils Absolute 0.1 0.0 - 0.2 x10E3/uL   Immature Granulocytes 0 Not Estab. %   Immature Grans (Abs) 0.0 0.0 - 0.1 x10E3/uL  CMP14+EGFR  Result Value Ref Range   Glucose 101 (H) 65 - 99 mg/dL   BUN 15 8 - 27 mg/dL  Creatinine, Ser 0.75 0.57 - 1.00 mg/dL   eGFR 80 >59 mL/min/1.73   BUN/Creatinine Ratio 20 12 - 28   Sodium 137 134 - 144 mmol/L   Potassium 4.8 3.5 - 5.2 mmol/L   Chloride 96 96 - 106 mmol/L   CO2 23 20 - 29 mmol/L   Calcium 9.4 8.7 - 10.3 mg/dL   Total Protein 6.9 6.0 - 8.5 g/dL   Albumin 4.3 3.6 - 4.6 g/dL   Globulin, Total 2.6 1.5 - 4.5 g/dL   Albumin/Globulin Ratio 1.7 1.2 - 2.2   Bilirubin Total 0.3 0.0 - 1.2 mg/dL   Alkaline Phosphatase 83 44 - 121 IU/L   AST 33 0 - 40 IU/L   ALT 25 0 - 32 IU/L  Lipid panel  Result Value Ref Range   Cholesterol, Total 150 100 - 199 mg/dL   Triglycerides 132 0 - 149 mg/dL   HDL 57 >39 mg/dL   VLDL Cholesterol Cal 23 5 - 40 mg/dL   LDL Chol Calc (NIH) 70 0 - 99 mg/dL   Chol/HDL Ratio 2.6 0.0 - 4.4 ratio  TSH  Result Value Ref Range   TSH 1.510 0.450 - 4.500 uIU/mL  Specimen status report  Result Value Ref Range   specimen status report Comment     Assessment & Plan:   Problem List Items Addressed This Visit   None   Visit Diagnoses    Community acquired pneumonia, unspecified laterality    -  Primary   Relevant Orders   DG Chest 2 View   Fatigue, unspecified type       Relevant Orders   DG Chest 2 View    Patient improving, will do follow-up chest x-ray, do not have chest x-ray today so she will come back and do it.  Follow up plan: Return in about 4 months (around 04/25/2021), or if symptoms worsen or fail to improve, for Follow-up chronic medical issues, patient already has appointment.  Counseling provided for all of the vaccine components Orders Placed This Encounter  Procedures  . DG Chest 2 View    Caryl Pina, MD [facility] 12/24/2020, 9:35 AM

## 2020-12-25 DIAGNOSIS — M9903 Segmental and somatic dysfunction of lumbar region: Secondary | ICD-10-CM | POA: Diagnosis not present

## 2020-12-25 DIAGNOSIS — M9901 Segmental and somatic dysfunction of cervical region: Secondary | ICD-10-CM | POA: Diagnosis not present

## 2020-12-25 DIAGNOSIS — M9904 Segmental and somatic dysfunction of sacral region: Secondary | ICD-10-CM | POA: Diagnosis not present

## 2020-12-25 DIAGNOSIS — M62830 Muscle spasm of back: Secondary | ICD-10-CM | POA: Diagnosis not present

## 2020-12-26 ENCOUNTER — Other Ambulatory Visit: Payer: Self-pay

## 2020-12-26 ENCOUNTER — Ambulatory Visit: Payer: Medicare Other

## 2020-12-26 ENCOUNTER — Other Ambulatory Visit (INDEPENDENT_AMBULATORY_CARE_PROVIDER_SITE_OTHER): Payer: Medicare Other

## 2020-12-26 DIAGNOSIS — R5383 Other fatigue: Secondary | ICD-10-CM

## 2020-12-26 DIAGNOSIS — J189 Pneumonia, unspecified organism: Secondary | ICD-10-CM

## 2020-12-27 ENCOUNTER — Telehealth: Payer: Self-pay | Admitting: Family Medicine

## 2020-12-27 DIAGNOSIS — R918 Other nonspecific abnormal finding of lung field: Secondary | ICD-10-CM

## 2020-12-27 NOTE — Telephone Encounter (Signed)
Patient's area of concern of consolidation that was there previously is still there, recommend pulmonology referral, placed pulmonology referral.

## 2020-12-27 NOTE — Telephone Encounter (Signed)
Pt made aware. She would like to see a pulmonologist in [place]. It looks like the referral is for . Message sent to referrals to check.

## 2021-01-01 DIAGNOSIS — M79676 Pain in unspecified toe(s): Secondary | ICD-10-CM | POA: Diagnosis not present

## 2021-01-01 DIAGNOSIS — B351 Tinea unguium: Secondary | ICD-10-CM | POA: Diagnosis not present

## 2021-01-01 DIAGNOSIS — E1142 Type 2 diabetes mellitus with diabetic polyneuropathy: Secondary | ICD-10-CM | POA: Diagnosis not present

## 2021-01-01 DIAGNOSIS — L84 Corns and callosities: Secondary | ICD-10-CM | POA: Diagnosis not present

## 2021-01-04 DIAGNOSIS — M5416 Radiculopathy, lumbar region: Secondary | ICD-10-CM | POA: Diagnosis not present

## 2021-01-04 DIAGNOSIS — M533 Sacrococcygeal disorders, not elsewhere classified: Secondary | ICD-10-CM | POA: Diagnosis not present

## 2021-01-04 DIAGNOSIS — M545 Low back pain, unspecified: Secondary | ICD-10-CM | POA: Diagnosis not present

## 2021-01-11 DIAGNOSIS — M5416 Radiculopathy, lumbar region: Secondary | ICD-10-CM | POA: Insufficient documentation

## 2021-01-15 ENCOUNTER — Encounter (INDEPENDENT_AMBULATORY_CARE_PROVIDER_SITE_OTHER): Payer: Medicare Other | Admitting: Ophthalmology

## 2021-01-25 ENCOUNTER — Ambulatory Visit (INDEPENDENT_AMBULATORY_CARE_PROVIDER_SITE_OTHER): Payer: Medicare Other | Admitting: Family Medicine

## 2021-01-25 ENCOUNTER — Encounter: Payer: Self-pay | Admitting: Family Medicine

## 2021-01-25 ENCOUNTER — Other Ambulatory Visit: Payer: Self-pay

## 2021-01-25 VITALS — BP 114/61 | HR 64 | Temp 98.5°F | Resp 20 | Ht 60.0 in | Wt 172.0 lb

## 2021-01-25 DIAGNOSIS — I2510 Atherosclerotic heart disease of native coronary artery without angina pectoris: Secondary | ICD-10-CM | POA: Diagnosis not present

## 2021-01-25 DIAGNOSIS — N3000 Acute cystitis without hematuria: Secondary | ICD-10-CM | POA: Diagnosis not present

## 2021-01-25 DIAGNOSIS — R300 Dysuria: Secondary | ICD-10-CM | POA: Diagnosis not present

## 2021-01-25 LAB — URINALYSIS, COMPLETE
Bilirubin, UA: NEGATIVE
Glucose, UA: NEGATIVE
Ketones, UA: NEGATIVE
Nitrite, UA: NEGATIVE
Specific Gravity, UA: 1.01 (ref 1.005–1.030)
Urobilinogen, Ur: 0.2 mg/dL (ref 0.2–1.0)
pH, UA: 5.5 (ref 5.0–7.5)

## 2021-01-25 LAB — MICROSCOPIC EXAMINATION: WBC, UA: >30 /hpf — AB (ref 0–5)

## 2021-01-25 MED ORDER — DOXYCYCLINE HYCLATE 100 MG PO TABS: 100.0000 mg | ORAL_TABLET | Freq: Two times a day (BID) | ORAL | 0 refills | Status: DC

## 2021-01-25 NOTE — Progress Notes (Signed)
BP 114/61   Pulse 64   Temp 98.5 F (36.9 C) (Temporal)   Resp 20   Ht 5' (1.524 m)   Wt 172 lb (78 kg)   SpO2 97%   BMI 33.59 kg/m    Subjective:   Patient ID: Misty Smith, female    DOB: 11-May-1941, 80 y.o.   MRN: [id number]  HPI: Misty Smith is a 80 y.o. female presenting on 01/25/2021 for Urinary Retention   HPI Dysuria and frequency and burning Patient comes in complaining of dysuria and frequency of burning and lower abdominal pain that started yesterday.  She feels like she has a UTI, she had seen urology and was doing some treatments to help not have the months often and it was working until this 1 popped up.  Relevant past medical, surgical, family and social history reviewed and updated as indicated. Interim medical history since our last visit reviewed. Allergies and medications reviewed and updated.  Review of Systems  Constitutional: Negative for chills and fever.  Eyes: Negative for redness and visual disturbance.  Respiratory: Negative for chest tightness and shortness of breath.   Cardiovascular: Negative for chest pain and leg swelling.  Gastrointestinal: Negative for abdominal pain.  Genitourinary: Positive for dysuria, frequency and urgency. Negative for difficulty urinating, hematuria, vaginal bleeding, vaginal discharge and vaginal pain.  Musculoskeletal: Negative for back pain and gait problem.  Skin: Negative for rash.  Neurological: Negative for light-headedness and headaches.  Psychiatric/Behavioral: Negative for agitation and behavioral problems.  All other systems reviewed and are negative.   Per HPI unless specifically indicated above   Allergies as of 01/25/2021      Reactions   Ciprofloxacin Rash   Codeine Rash   Iohexol     Desc: PT HAD HIVES AND WAS GIVEN BENEDRYL PO BY NURSE AND OBSERVED IN NURSES STATION AND RELEASED WITHOUT AND OTHER COMPLICATIONS SHEET SCANNED UNDER NURSES NOTES///ON 12/01/11 PT HAD ANOTHER CT WITH PREMEDS  MINUS THE BENADRYL. SHE HAD A DELAYED REACTION AFTER WITH THROAT TIGHTNESS AND SOB, JB  12/07/12   Penicillins Hives   Sulfonamide Derivatives Hives   Asa [aspirin] Other (See Comments)   Large amounts causes stomach pain   Keflex [cephalexin] Nausea And Vomiting   Morphine And Related Rash      Medication List       Accurate as of Jan 25, 2021  9:29 AM. If you have any questions, ask your nurse or doctor.        amLODipine 10 MG tablet Commonly known as: NORVASC Take 1 tablet (10 mg total) by mouth daily.   aspirin EC 81 MG tablet Take 1 tablet (81 mg total) by mouth daily.   atorvastatin 80 MG tablet Commonly known as: LIPITOR TAKE AS DIRECTED   Calcium Carb-Cholecalciferol 600-800 MG-UNIT Tabs Take 1 tablet by mouth every evening.   cholecalciferol 1000 units tablet Commonly known as: VITAMIN D Take 2,000 Units by mouth daily.   escitalopram 20 MG tablet Commonly known as: LEXAPRO Take 1 tablet (20 mg total) by mouth daily.   esomeprazole 40 MG capsule Commonly known as: NEXIUM Take 1 capsule (40 mg total) by mouth daily.   estradiol 0.1 MG/GM vaginal cream Commonly known as: ESTRACE Place vaginally.   hydrochlorothiazide 12.5 MG tablet Commonly known as: HYDRODIURIL Take 1 tablet (12.5 mg total) by mouth daily.   lisinopril 40 MG tablet Commonly known as: ZESTRIL TAKE 1 TABLET BY MOUTH EVERY DAY   metoprolol tartrate 25 MG  tablet Commonly known as: LOPRESSOR Take 0.5 tablets (12.5 mg total) by mouth 2 (two) times daily.   multivitamin tablet Take 1 tablet by mouth every morning.   omega-3 acid ethyl esters 1 g capsule Commonly known as: LOVAZA Take 2 capsules (2 g total) by mouth 2 (two) times daily.   pioglitazone 30 MG tablet Commonly known as: ACTOS TAKE 1 TABLET BY MOUTH EVERY DAY        Objective:   BP 114/61   Pulse 64   Temp 98.5 F (36.9 C) (Temporal)   Resp 20   Ht 5' (1.524 m)   Wt 172 lb (78 kg)   SpO2 97%   BMI 33.59  kg/m   Wt Readings from Last 3 Encounters:  01/25/21 172 lb (78 kg)  12/24/20 174 lb (78.9 kg)  11/30/20 174 lb (78.9 kg)    Physical Exam Vitals and nursing note reviewed.  Constitutional:      General: She is not in acute distress.    Appearance: She is well-developed. She is not diaphoretic.  Eyes:     Conjunctiva/sclera: Conjunctivae normal.  Cardiovascular:     Rate and Rhythm: Normal rate and regular rhythm.     Heart sounds: Normal heart sounds. No murmur heard.   Pulmonary:     Effort: Pulmonary effort is normal. No respiratory distress.     Breath sounds: Normal breath sounds. No wheezing.  Abdominal:     General: Bowel sounds are normal. There is no distension.     Palpations: Abdomen is soft. Abdomen is not rigid. There is no mass.     Tenderness: There is abdominal tenderness in the suprapubic area. There is no guarding or rebound.  Musculoskeletal:        General: No tenderness. Normal range of motion.  Skin:    General: Skin is warm and dry.     Findings: No rash.  Neurological:     Mental Status: She is alert and oriented to person, place, and time.     Coordination: Coordination normal.  Psychiatric:        Behavior: Behavior normal.       Assessment & Plan:   Problem List Items Addressed This Visit   None   Visit Diagnoses    Acute cystitis without hematuria    -  Primary   Relevant Orders   Urinalysis, Complete (Completed)   Urine Culture    Will treat urinalysis with doxycycline, did come back positive for 3+ leukocytes and 2+ blood and 2+ protein and many bacteria, will await culture as well.  She has allergies to many other antibiotics  Follow up plan: Return if symptoms worsen or fail to improve.  Counseling provided for all of the vaccine components Orders Placed This Encounter  Procedures  . Urinalysis, Complete    Caryl Pina, MD [facility] 01/25/2021, 9:29 AM

## 2021-01-30 ENCOUNTER — Encounter (INDEPENDENT_AMBULATORY_CARE_PROVIDER_SITE_OTHER): Payer: Self-pay | Admitting: Ophthalmology

## 2021-01-30 ENCOUNTER — Other Ambulatory Visit: Payer: Self-pay

## 2021-01-30 ENCOUNTER — Ambulatory Visit (INDEPENDENT_AMBULATORY_CARE_PROVIDER_SITE_OTHER): Payer: Medicare Other | Admitting: Ophthalmology

## 2021-01-30 DIAGNOSIS — D3132 Benign neoplasm of left choroid: Secondary | ICD-10-CM

## 2021-01-30 DIAGNOSIS — E119 Type 2 diabetes mellitus without complications: Secondary | ICD-10-CM

## 2021-01-30 DIAGNOSIS — Z961 Presence of intraocular lens: Secondary | ICD-10-CM | POA: Diagnosis not present

## 2021-01-30 DIAGNOSIS — E1169 Type 2 diabetes mellitus with other specified complication: Secondary | ICD-10-CM | POA: Insufficient documentation

## 2021-01-30 LAB — URINE CULTURE

## 2021-01-30 NOTE — Assessment & Plan Note (Signed)
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

## 2021-01-30 NOTE — Progress Notes (Signed)
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       Last edited by Hurman Horn, MD on 01/30/2021  8:42 AM. (History)      Referring physician: Dettinger, Fransisca Kaufmann, MD [place],  [place] [address]  HISTORICAL INFORMATION:   Selected notes from the MEDICAL RECORD NUMBER    Lab Results  Component Value Date   HGBA1C 6.1 11/27/2020     CURRENT MEDICATIONS: No current outpatient medications on file. (Ophthalmic Drugs)   No current facility-administered medications for this visit. (Ophthalmic Drugs)   Current Outpatient Medications (Other)  Medication Sig  . amLODipine (NORVASC) 10 MG tablet Take 1 tablet (10 mg total) by mouth daily.  Marland Kitchen aspirin EC 81 MG tablet Take 1 tablet (81 mg total) by mouth daily.  Marland Kitchen atorvastatin (LIPITOR) 80 MG tablet TAKE AS DIRECTED  . Calcium Carb-Cholecalciferol 600-800 MG-UNIT TABS Take 1 tablet by mouth every evening.   . cholecalciferol (VITAMIN D) 1000 UNITS tablet Take 2,000 Units by mouth daily.  Marland Kitchen doxycycline (VIBRA-TABS) 100 MG tablet Take 1 tablet (100 mg total) by mouth 2 (two) times daily. 1 po bid  . escitalopram (LEXAPRO) 20 MG tablet Take 1 tablet (20 mg total) by mouth daily.  Marland Kitchen esomeprazole (NEXIUM) 40 MG capsule Take 1 capsule (40 mg total) by mouth daily.  Marland Kitchen estradiol (ESTRACE) 0.1 MG/GM vaginal cream Place vaginally.  . hydrochlorothiazide (HYDRODIURIL) 12.5 MG tablet Take 1 tablet (12.5 mg total) by mouth daily.  Marland Kitchen lisinopril (ZESTRIL) 40 MG tablet TAKE 1 TABLET BY MOUTH EVERY DAY  . metoprolol tartrate (LOPRESSOR) 25 MG tablet Take 0.5 tablets  (12.5 mg total) by mouth 2 (two) times daily.  . Multiple Vitamin (MULTIVITAMIN) tablet Take 1 tablet by mouth every morning.   Marland Kitchen omega-3 acid ethyl esters (LOVAZA) 1 g capsule Take 2 capsules (2 g total) by mouth 2 (two) times daily.  . pioglitazone (ACTOS) 30 MG tablet TAKE 1 TABLET BY MOUTH EVERY DAY   No current facility-administered medications for this visit. (Other)      REVIEW OF SYSTEMS:    ALLERGIES Allergies  Allergen Reactions  . Ciprofloxacin Rash  . Codeine Rash  . Iohexol      Desc: PT HAD HIVES AND WAS GIVEN BENEDRYL PO BY NURSE AND OBSERVED IN NURSES STATION AND RELEASED WITHOUT AND OTHER COMPLICATIONS SHEET SCANNED UNDER NURSES NOTES///ON 12/01/11 PT HAD ANOTHER CT WITH PREMEDS MINUS THE BENADRYL. SHE HAD A DELAYED REACTION AFTER WITH THROAT TIGHTNESS AND SOB, JB  12/07/12   . Penicillins Hives  . Sulfonamide Derivatives Hives  . Asa [Aspirin] Other (See Comments)    Large amounts causes stomach pain  . Keflex [Cephalexin] Nausea And Vomiting  . Morphine And Related Rash    PAST MEDICAL HISTORY Past Medical History:  Diagnosis Date  . Allergy   . Anxiety   . Arthritis   . Bronchitis   . Cataract   . Colovaginal fistula   . Depression   . Diabetes mellitus without complication ([facility])   . Dysrhythmia  palpitations, occasional PVCs; Dr Stanford Breed cardiologist  . Esophageal stricture   . Fractured elbow 1970's  . GERD (gastroesophageal reflux disease)   . H/O hiatal hernia   . Hyperlipidemia   . Hypertension   . Osteopenia   . Perimenopausal vasomotor symptoms   . Postmenopausal HRT (hormone replacement therapy)   . Pre-diabetes   . Stress   . Subacute lupus erythematosus    Dr. Allyson Sabal   Past Surgical History:  Procedure Laterality Date  . abdominal bypass  1987   fibroids   . ABDOMINAL HYSTERECTOMY     partial  . bilateral tubal ligtation  1972  . CHOLECYSTECTOMY N/A 03/21/2014   Procedure: LAPAROSCOPIC CHOLECYSTECTOMY WITH INTRAOPERATIVE  CHOLANGIOGRAM;  Surgeon: Shann Medal, MD;  Location: [facility];  Service: General;  Laterality: N/A;  . COLONOSCOPY    . elbow Right 1976   surgery after fracture of elbow  . EYE SURGERY Bilateral    cataracts  . ROTATOR CUFF REPAIR Right 03/29/2018  . TONSILLECTOMY    . TUBAL LIGATION      FAMILY HISTORY Family History  Problem Relation Age of Onset  . Cancer Mother        laryngeal   . Hypertension Mother   . Heart disease Mother   . Hypertension Father   . Coronary artery disease Father   . Kidney disease Father        kidney failure   . Hypertension Sister   . Cancer Sister        breast  . Heart attack Sister   . Heart disease Sister   . Stroke Sister   . Stroke Sister   . Hypertension Brother   . Cancer Brother        liver  . Hypertension Sister   . Hyperlipidemia Sister   . Hypertension Brother   . Heart attack Brother   . Hypertension Son   . Hypertension Daughter     SOCIAL HISTORY Social History   Tobacco Use  . Smoking status: Never Smoker  . Smokeless tobacco: Never Used  Vaping Use  . Vaping Use: Never used  Substance Use Topics  . Alcohol use: No    Alcohol/week: 0.0 standard drinks  . Drug use: No         OPHTHALMIC EXAM: Base Eye Exam    Visual Acuity (ETDRS)      Right Left   Dist cc 20/20 -1 20/20       Tonometry (Tonopen, 8:18 AM)      Right Left   Pressure 15 14       Neuro/Psych    Oriented x3: Yes   Mood/Affect: Normal       Dilation    Both eyes: 1.0% Mydriacyl, 2.5% Phenylephrine @ 8:19 AM        Slit Lamp and Fundus Exam    External Exam      Right Left   External Normal Normal       Slit Lamp Exam      Right Left   Lids/Lashes Normal Normal   Conjunctiva/Sclera White and quiet White and quiet   Cornea Clear Clear   Anterior Chamber Deep and quiet Deep and quiet   Iris Round and reactive Round and reactive   Lens Posterior chamber intraocular lens Posterior chamber intraocular lens   Anterior  Vitreous Normal Normal       Fundus Exam      Right Left   Posterior Vitreous Normal Normal  Disc Normal Normal   C/D Ratio 0.4 0.45   Macula Normal Microaneurysms, 2 on the superior edge of FAZ no exudates no thickening, no macular thickening   Vessels no DR no DR   Periphery Normal Choroidal nevus, superotemporal, roughly 2 DD and diameter, flat with white drusen.  No lipofuscin no fluid, no halo no atrophy and no subretinal fluid          IMAGING AND PROCEDURES  Imaging and Procedures for 01/30/21  Color Fundus Photography Optos - OU - Both Eyes       Right Eye Progression has been stable. Disc findings include normal observations. Macula : normal observations. Vessels : normal observations. Periphery : normal observations.   Left Eye Progression has been stable. Disc findings include normal observations. Vessels : normal observations.   Notes OS choroidal nevus superotemporal to the arcade superiorly with no subretinal fluid, no high risk features.  Stable over the last 6 years of photodocumentation.                ASSESSMENT/PLAN:  Choroidal nevus, left The nature of choroidal nevus was discussed with the patient and an informational form was offered.  Photo documentation was discussed with the patient.  Periodic follow-up may be needed for a lifetime. The patient's questions were answered. At minimum, annual exams will be needed if no signs of early growth.  No interval change over the last year  Diabetes mellitus without complication (HCC) 2 small microaneurysms on the superior border of the macula OS, not certain to be diabetic retinopathy but we will monitor, no active maculopathy will observe      ICD-10-CM   1. Choroidal nevus, left  D31.32 Color Fundus Photography Optos - OU - Both Eyes  2. Diabetes mellitus without complication ([facility])  G28.3   3. Pseudophakia of both eyes  Z96.1     1.  OS with no interval change over the last year nor over the  last 6 years and photo comparison of choroidal nevus left eye.  High risk features continue to observe  2.  Two microaneurysms in the macula left eye did not specify the presence of diabetic retinopathy and could be age-related and/or small macular BRVO which of no visual consequence at this time with no exudative leakages no macular thickening we will continue to watch  3.  Ophthalmic Meds Ordered this visit:  No orders of the defined types were placed in this encounter.      Return in about 1 year (around 01/30/2022), or ,, For choroidal nevus left, for DILATE OU, COLOR FP.  There are no Patient Instructions on file for this visit.   Explained the diagnoses, plan, and follow up with the patient and they expressed understanding.  Patient expressed understanding of the importance of proper follow up care.   Clent Demark Paiden Caraveo M.D. Diseases & Surgery of the Retina and Vitreous [facility] [place] 01/30/21     Abbreviations: M myopia (nearsighted); A astigmatism; H hyperopia (farsighted); P presbyopia; Mrx spectacle prescription;  CTL contact lenses; OD right eye; OS left eye; OU both eyes  XT exotropia; ET esotropia; PEK punctate epithelial keratitis; PEE punctate epithelial erosions; DES dry eye syndrome; MGD meibomian gland dysfunction; ATs artificial tears; PFAT's preservative free artificial tears; [place] nuclear sclerotic cataract; PSC posterior subcapsular cataract; ERM epi-retinal membrane; PVD posterior vitreous detachment; RD retinal detachment; DM diabetes mellitus; DR diabetic retinopathy; NPDR non-proliferative diabetic retinopathy; PDR proliferative diabetic retinopathy; CSME clinically significant macular edema;  DME diabetic macular edema; dbh dot blot hemorrhages; CWS cotton wool spot; POAG primary open angle glaucoma; C/D cup-to-disc ratio; HVF humphrey visual field; GVF goldmann visual field; OCT optical coherence tomography; IOP intraocular pressure; BRVO Branch retinal  vein occlusion; CRVO central retinal vein occlusion; CRAO central retinal artery occlusion; BRAO branch retinal artery occlusion; RT retinal tear; SB scleral buckle; PPV pars plana vitrectomy; VH Vitreous hemorrhage; PRP panretinal laser photocoagulation; IVK intravitreal kenalog; VMT vitreomacular traction; MH Macular hole;  NVD neovascularization of the disc; NVE neovascularization elsewhere; AREDS age related eye disease study; ARMD age related macular degeneration; POAG primary open angle glaucoma; EBMD epithelial/anterior basement membrane dystrophy; ACIOL anterior chamber intraocular lens; IOL intraocular lens; PCIOL posterior chamber intraocular lens; Phaco/IOL phacoemulsification with intraocular lens placement; [place] photorefractive keratectomy; LASIK laser assisted in situ keratomileusis; HTN hypertension; DM diabetes mellitus; COPD chronic obstructive pulmonary disease

## 2021-01-30 NOTE — Assessment & Plan Note (Signed)
The nature of choroidal nevus was discussed with the patient and an informational form was offered.  Photo documentation was discussed with the patient.  Periodic follow-up may be needed for a lifetime. The patient's questions were answered. At minimum, annual exams will be needed if no signs of early growth.  No interval change over the last year

## 2021-02-05 ENCOUNTER — Other Ambulatory Visit: Payer: Self-pay | Admitting: Family Medicine

## 2021-02-05 DIAGNOSIS — Z1231 Encounter for screening mammogram for malignant neoplasm of breast: Secondary | ICD-10-CM

## 2021-02-07 ENCOUNTER — Ambulatory Visit (INDEPENDENT_AMBULATORY_CARE_PROVIDER_SITE_OTHER): Payer: Medicare Other | Admitting: Internal Medicine

## 2021-02-07 ENCOUNTER — Other Ambulatory Visit: Payer: Self-pay

## 2021-02-07 ENCOUNTER — Encounter: Payer: Self-pay | Admitting: Internal Medicine

## 2021-02-07 DIAGNOSIS — I10 Essential (primary) hypertension: Secondary | ICD-10-CM

## 2021-02-07 DIAGNOSIS — I2510 Atherosclerotic heart disease of native coronary artery without angina pectoris: Secondary | ICD-10-CM | POA: Diagnosis not present

## 2021-02-07 DIAGNOSIS — R058 Other specified cough: Secondary | ICD-10-CM | POA: Diagnosis not present

## 2021-02-07 DIAGNOSIS — E049 Nontoxic goiter, unspecified: Secondary | ICD-10-CM

## 2021-02-07 DIAGNOSIS — M5416 Radiculopathy, lumbar region: Secondary | ICD-10-CM | POA: Diagnosis not present

## 2021-02-07 DIAGNOSIS — R918 Other nonspecific abnormal finding of lung field: Secondary | ICD-10-CM

## 2021-02-07 MED ORDER — IRBESARTAN 150 MG PO TABS: 150.0000 mg | ORAL_TABLET | Freq: Every day | ORAL | 2 refills | Status: DC

## 2021-02-07 NOTE — Patient Instructions (Addendum)
Stop lisinopril and start avapro (ibesartan) 150 mg one daily  - if bp too low esp less 110 on top number , break amlodipine and take a half - if bp too high (or 140 top number) , take ibesartan 150 mg twice daily   Continue nexium 40 mg Take 30-60 min before first meal of the day.  GERD (REFLUX)  is an extremely common cause of respiratory symptoms just like yours , many times with no obvious heartburn at all.    It can be treated with medication, but also with lifestyle changes including elevation of the head of your bed (ideally with 6 -8inch blocks under the headboard of your bed),  Smoking cessation, avoidance of late meals, excessive alcohol, and avoid fatty foods, chocolate, peppermint, colas, red wine, and acidic juices such as orange juice.  NO MINT OR MENTHOL PRODUCTS SO NO COUGH DROPS  USE SUGARLESS CANDY INSTEAD (Jolley ranchers or Stover's or Life Savers) or even ice chips will also do - the key is to swallow to prevent all throat clearing. NO OIL BASED VITAMINS - use powdered substitutes.  Avoid fish oil when coughing.   Please schedule a follow up office visit in 6 weeks, call sooner if needed

## 2021-02-07 NOTE — Progress Notes (Signed)
Misty Smith, female    DOB: 1940/09/10    MRN: [id number]   Brief patient profile:  40 yowf never smoker mostly spring /fall rhinitis in 40's on ACEi with cough attributed to pnds referred to pulmonary clinic 02/07/2021 by Dr Dettinger with a more severe cough since pna dx feb 2022 and no  better p several rounds abx.     History of Present Illness  02/07/2021  Pulmonary/ 1st office eval/[facility]  Chief Complaint  Patient presents with   Pulmonary Consult    Referred by Dr Warrick Parisian.  Pt states had PNA back in Feb 2022 and has been txed with 2 courses of abx since then. She has occ cough, occ prod with clear sputum.   Dyspnea:  not limited but very sedentary Cough: worse when out in heat / p [place] food, min mucoid production, better with cough drops Sleep: bed is flat with one pillow  SABA use: none  No obvious day to day or daytime variability or assoc excess/ purulent sputum or mucus plugs or hemoptysis or cp or chest tightness, subjective wheeze    Sleeping ok despite cough  without nocturnal  or early am exacerbation  of respiratory  c/o's or need for noct saba. Also denies any obvious fluctuation of symptoms with weather or environmental changes or other aggravating or alleviating factors except as outlined above   No unusual exposure hx or h/o childhood pna/ asthma or knowledge of premature birth.  Current Allergies, Complete Past Medical History, Past Surgical History, Family History, and Social History were reviewed in [employer] record.  ROS  The following are not active complaints unless bolded Hoarseness, sore throat, dysphagia, dental problems, itching, sneezing,  nasal congestion or discharge of excess mucus or purulent secretions, ear ache,   fever, chills, sweats, unintended wt loss or wt gain, classically pleuritic or exertional cp,  orthopnea pnd or arm/hand swelling  or leg swelling, presyncope, palpitations, abdominal pain, anorexia, nausea,  vomiting, diarrhea  or change in bowel habits or change in bladder habits, change in stools or change in urine, dysuria, hematuria,  rash, arthralgias, visual complaints, headache, numbness, weakness or ataxia or problems with walking or coordination,  change in mood or  memory.            Past Medical History:  Diagnosis Date   Allergy    Anxiety    Arthritis    Bronchitis    Cataract    Colovaginal fistula    Depression    Diabetes mellitus without complication (HCC)    Dysrhythmia    palpitations, occasional PVCs; Dr Stanford Breed cardiologist   Esophageal stricture    Fractured elbow 1970's   GERD (gastroesophageal reflux disease)    H/O hiatal hernia    Hyperlipidemia    Hypertension    Osteopenia    Perimenopausal vasomotor symptoms    Postmenopausal HRT (hormone replacement therapy)    Pre-diabetes    Stress    Subacute lupus erythematosus    Dr. Allyson Sabal    Outpatient Medications Prior to Visit  Medication Sig Dispense Refill   amLODipine (NORVASC) 10 MG tablet Take 1 tablet (10 mg total) by mouth daily. 90 tablet 3   atorvastatin (LIPITOR) 80 MG tablet TAKE AS DIRECTED 90 tablet 1   Calcium Carb-Cholecalciferol 600-800 MG-UNIT TABS Take 1 tablet by mouth every evening.      cholecalciferol (VITAMIN D) 1000 UNITS tablet Take 2,000 Units by mouth daily.     escitalopram (LEXAPRO)  20 MG tablet Take 1 tablet (20 mg total) by mouth daily. 90 tablet 3   esomeprazole (NEXIUM) 40 MG capsule Take 1 capsule (40 mg total) by mouth daily. 90 capsule 3   estradiol (ESTRACE) 0.1 MG/GM vaginal cream Place vaginally.     hydrochlorothiazide (HYDRODIURIL) 12.5 MG tablet Take 1 tablet (12.5 mg total) by mouth daily. 90 tablet 3   lisinopril (ZESTRIL) 40 MG tablet TAKE 1 TABLET BY MOUTH EVERY DAY 90 tablet 3   metoprolol tartrate (LOPRESSOR) 25 MG tablet Take 0.5 tablets (12.5 mg total) by mouth 2 (two) times daily. 90 tablet 3   Multiple Vitamin (MULTIVITAMIN) tablet Take 1 tablet by  mouth every morning.      omega-3 acid ethyl esters (LOVAZA) 1 g capsule Take 2 capsules (2 g total) by mouth 2 (two) times daily. 360 capsule 3   pioglitazone (ACTOS) 30 MG tablet TAKE 1 TABLET BY MOUTH EVERY DAY 90 tablet 0   aspirin EC 81 MG tablet Take 1 tablet (81 mg total) by mouth daily. (Patient not taking: Reported on 02/07/2021) 90 tablet 3   doxycycline (VIBRA-TABS) 100 MG tablet Take 1 tablet (100 mg total) by mouth 2 (two) times daily. 1 po bid 20 tablet 0   No facility-administered medications prior to visit.     Objective:     BP 130/70 (BP Location: Left Arm, Cuff Size: Normal)   Pulse 76   Temp 98.3 F (36.8 C) (Temporal)   Ht 5' (1.524 m)   Wt 174 lb 3.2 oz (79 kg)   SpO2 95% Comment: on RA  BMI 34.02 kg/m   SpO2: 95 % (on RA)   HEENT : pt wearing mask not removed for exam due to covid -19 concerns.    NECK :  without JVD/Nodes/TM/ nl carotid upstrokes bilaterally   LUNGS: no acc muscle use,  Nl contour chest which is clear to A and P bilaterally without cough on insp or exp maneuvers   CV:  RRR  no s3 or murmur or increase in P2, and 1+ pitting both LE's  ABD:  soft and nontender with nl inspiratory excursion in the supine position. No bruits or organomegaly appreciated, bowel sounds nl  MS:  Nl gait/ ext warm without deformities, calf tenderness, cyanosis or clubbing No obvious joint restrictions   SKIN: warm and dry without lesions    NEURO:  alert, approp, nl sensorium with  no motor or cerebellar deficits apparent.      I personally reviewed images and agree with radiology impression as follows:   Chest CT w/o contrast  11/28/20  No mediastinal lymphadenopathy. No evidence for gross hilar lymphadenopathy although assessment is limited by the lack of intravenous contrast on today's study. 2.4 x 1.8 cm soft tissue nodule in the high right retrotracheal space is contiguous with the posterior aspect of the right thyroid gland. This  nodule measured 2.3 x 1.7 cm on the chest CT from 6 years ago. Imaging features most consistent with benign etiology and this is probably an exophytic posterior right thyroid nodule. The esophagus has normal imaging features. Tiny hiatal hernia noted. There is no axillary lymphadenopathy.   Lungs/Pleura: Innumerable tiny solid and ground-glass nodules are seen in the lower lungs bilaterally, right greater than left, show no substantial interval change. A focus of tubular opacity with associated tree-in-bud nodularity is seen in the posterior right upper lobe (see image 60/series 4. This finding accounts for the abnormality on the chest x-ray. No pleural  effusion.      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  This is an extremely common benign condition in the elderly and does not warrant aggressive eval/ rx at this point unless there is a clinical correlation suggesting unaddressed pulmonary infection (purulent sputum, night sweats, unintended wt loss, doe) or evolution of  obvious changes on plain cxr (as opposed to serial CT, which is way over sensitive to make clinical decisions re intervention and treatment in the elderly, who tend to tolerate both dx and treatment poorly) .   >>> f/u with cxr's is all that's needed here unless new symptoms     Upper airway cough syndrome Upper airway cough syndrome (previously labeled PNDS),  is so named because it's frequently impossible to sort out how much is  CR/sinusitis with freq throat clearing (which can be related to primary GERD)   vs  causing  secondary (" extra esophageal")  GERD from wide swings in gastric pressure that occur with throat clearing, often  promoting self use of mint and menthol lozenges that reduce the lower esophageal sphincter tone and exacerbate the problem further in a  cyclical fashion.   These are the same pts (now being labeled as having "irritable larynx syndrome" by some cough centers) who not infrequently have a history of having failed to tolerate ace inhibitors,  dry powder inhalers or biphosphonates or report having atypical/extraesophageal reflux symptoms that don't respond to standard doses of PPI  and are easily confused as having aecopd or asthma flares by even experienced allergists/ pulmonologists (myself included).   She gives hx suggestive of chronic rhinitis and gerd but also on acei so reasonable to start with ARB and continue nexium plus gerd diet/ lifestyle recs return in 6 weeks to regroup.      Essential hypertension Trial off acei 02/07/2021   ACE inhibitors are problematic in  pts with airway complaints because  even experienced pulmonologists can't   distinguish ace effects from copd/asthma/pnds.  By themselves they don't actually cause a problem, much like oxygen can't by itself start a fire, but they certainly serve as a powerful catalyst or enhancer for any "fire"  or inflammatory process in the upper airway, be it caused by an ET  tube or more commonly reflux (especially in the obese or pts with known GERD or who are on biphoshonates).    In the era of ARB near equivalency until we have a better handle on the reversibility of the airway problem, it just makes sense to avoid ACEI  entirely in the short run and then decide later, having established a level of airway control using a reasonable limited regimen, whether to add back ace but even then being very careful to observe the pt for worsening airway control and number of meds used/ needed to control symptoms.    rec trial of avapro 150 mg daily and f/u in 6 weeks         Substernal thyroid goiter See ct chest 11/28/20 dates back to 2014  No further w/u needed       Each maintenance medication was reviewed in detail including emphasizing most importantly the difference  between maintenance and prns and under what circumstances the prns are to be triggered using an action plan format where appropriate.  Total time for H and P, chart review,  counseling,  and generating customized AVS unique to this office visit / same day charting = 45 min           Christinia Gully, MD 02/07/2021

## 2021-02-08 ENCOUNTER — Encounter: Payer: Self-pay | Admitting: Internal Medicine

## 2021-02-08 ENCOUNTER — Institutional Professional Consult (permissible substitution): Payer: Medicare Other | Admitting: Internal Medicine

## 2021-02-08 DIAGNOSIS — R058 Other specified cough: Secondary | ICD-10-CM | POA: Insufficient documentation

## 2021-02-08 DIAGNOSIS — E049 Nontoxic goiter, unspecified: Secondary | ICD-10-CM | POA: Insufficient documentation

## 2021-02-08 NOTE — Assessment & Plan Note (Signed)
Trial off acei 02/07/2021    ACE inhibitors are problematic in  pts with airway complaints because  even experienced pulmonologists can't   distinguish ace effects from copd/asthma/pnds.  By themselves they don't actually cause a problem, much like oxygen can't by itself start a fire, but they certainly serve as a powerful catalyst or enhancer for any "fire"  or inflammatory process in the upper airway, be it caused by an ET  tube or more commonly reflux (especially in the obese or pts with known GERD or who are on biphoshonates).    In the era of ARB near equivalency until we have a better handle on the reversibility of the airway problem, it just makes sense to avoid ACEI  entirely in the short run and then decide later, having established a level of airway control using a reasonable limited regimen, whether to add back ace but even then being very careful to observe the pt for worsening airway control and number of meds used/ needed to control symptoms.    rec trial of avapro 150 mg daily and f/u in 6 weeks          Each maintenance medication was reviewed in detail including emphasizing most importantly the difference between maintenance and prns and under what circumstances the prns are to be triggered using an action plan format where appropriate.  Total time for H and P, chart review, counseling,  and generating customized AVS unique to this office visit / same day charting = 45 min

## 2021-02-08 NOTE — Assessment & Plan Note (Addendum)
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  This is an extremely common benign condition in the elderly and does not warrant aggressive eval/ rx at this point unless there is a clinical correlation suggesting unaddressed pulmonary infection (purulent sputum, night sweats, unintended wt loss, doe) or evolution of  obvious changes on plain cxr (as opposed to serial CT, which is way over sensitive to make clinical decisions re intervention and treatment in the elderly, who tend to tolerate both dx and treatment poorly) .   >>> f/u with cxr's is all that's needed here unless new symptoms

## 2021-02-08 NOTE — Assessment & Plan Note (Signed)
See ct chest 11/28/20 dates back to 2014  No further w/u needed

## 2021-02-08 NOTE — Assessment & Plan Note (Signed)
Upper airway cough syndrome (previously labeled PNDS),  is so named because it's frequently impossible to sort out how much is  CR/sinusitis with freq throat clearing (which can be related to primary GERD)   vs  causing  secondary (" extra esophageal")  GERD from wide swings in gastric pressure that occur with throat clearing, often  promoting self use of mint and menthol lozenges that reduce the lower esophageal sphincter tone and exacerbate the problem further in a cyclical fashion.   These are the same pts (now being labeled as having "irritable larynx syndrome" by some cough centers) who not infrequently have a history of having failed to tolerate ace inhibitors,  dry powder inhalers or biphosphonates or report having atypical/extraesophageal reflux symptoms that don't respond to standard doses of PPI  and are easily confused as having aecopd or asthma flares by even experienced allergists/ pulmonologists (myself included).   Misty Smith gives hx suggestive of chronic rhinitis and gerd but also on acei so reasonable to start with ARB and continue nexium plus gerd diet/ lifestyle recs return in 6 weeks to regroup.

## 2021-02-11 ENCOUNTER — Telehealth: Payer: Self-pay | Admitting: Cardiology

## 2021-02-11 NOTE — Telephone Encounter (Signed)
Pt c/o medication issue:  1. Name of Medication:  irbesartan (AVAPRO) 150 MG tablet  2. How are you currently taking this medication (dosage and times per day)? As prescribed  3. Are you having a reaction (difficulty breathing--STAT)? No   4. What is your medication issue?  Patient states Dr. Melvyn Novas with Pulmonology took her off Lisinopril and prescribed Irbesartan. She would like to ensure that Dr. Stanford Breed agrees with this. Please advise.

## 2021-02-11 NOTE — Telephone Encounter (Signed)
Spoke with pt, Aware of dr crenshaw's recommendations.  °

## 2021-02-11 NOTE — Telephone Encounter (Signed)
This RN called patient, patient reports she saw Dr. Melvyn Novas with pulmonology and he discontinued the patient's lisinopril 40mg  and switched her to irbesartan 150mg . She reports this is because she said she had a cough. The patient wanted to make sure Dr. Stanford Breed agreed with Dr. Gustavus Bryant choice of treatment before beginning the new medication. Patient reports she does not have any symptoms at this time, this RN told patient if she develops any concerning (shortness of breath/chest pain/ other symptoms) symptoms to call 911 or go to the emergency room. This RN let patient know her concerns would be passed on to Dr. Stanford Breed and his primary nurse.

## 2021-02-19 DIAGNOSIS — M5416 Radiculopathy, lumbar region: Secondary | ICD-10-CM | POA: Diagnosis not present

## 2021-02-20 DIAGNOSIS — M62830 Muscle spasm of back: Secondary | ICD-10-CM | POA: Diagnosis not present

## 2021-02-20 DIAGNOSIS — M9901 Segmental and somatic dysfunction of cervical region: Secondary | ICD-10-CM | POA: Diagnosis not present

## 2021-02-20 DIAGNOSIS — M9904 Segmental and somatic dysfunction of sacral region: Secondary | ICD-10-CM | POA: Diagnosis not present

## 2021-02-20 DIAGNOSIS — M9903 Segmental and somatic dysfunction of lumbar region: Secondary | ICD-10-CM | POA: Diagnosis not present

## 2021-03-06 DIAGNOSIS — M9901 Segmental and somatic dysfunction of cervical region: Secondary | ICD-10-CM | POA: Diagnosis not present

## 2021-03-06 DIAGNOSIS — M9903 Segmental and somatic dysfunction of lumbar region: Secondary | ICD-10-CM | POA: Diagnosis not present

## 2021-03-06 DIAGNOSIS — M62830 Muscle spasm of back: Secondary | ICD-10-CM | POA: Diagnosis not present

## 2021-03-06 DIAGNOSIS — M9904 Segmental and somatic dysfunction of sacral region: Secondary | ICD-10-CM | POA: Diagnosis not present

## 2021-03-11 ENCOUNTER — Ambulatory Visit
Admission: RE | Admit: 2021-03-11 | Discharge: 2021-03-11 | Disposition: A | Discharge status: Home / Self Care | Payer: Medicare Other | Source: Ambulatory Visit | Attending: Family Medicine | Admitting: Family Medicine

## 2021-03-11 ENCOUNTER — Other Ambulatory Visit: Payer: Self-pay

## 2021-03-11 DIAGNOSIS — Z1231 Encounter for screening mammogram for malignant neoplasm of breast: Secondary | ICD-10-CM

## 2021-03-12 DIAGNOSIS — D1801 Hemangioma of skin and subcutaneous tissue: Secondary | ICD-10-CM | POA: Diagnosis not present

## 2021-03-12 DIAGNOSIS — L814 Other melanin hyperpigmentation: Secondary | ICD-10-CM | POA: Diagnosis not present

## 2021-03-12 DIAGNOSIS — L930 Discoid lupus erythematosus: Secondary | ICD-10-CM | POA: Diagnosis not present

## 2021-03-12 DIAGNOSIS — L304 Erythema intertrigo: Secondary | ICD-10-CM | POA: Diagnosis not present

## 2021-03-12 DIAGNOSIS — L281 Prurigo nodularis: Secondary | ICD-10-CM | POA: Diagnosis not present

## 2021-03-12 DIAGNOSIS — L821 Other seborrheic keratosis: Secondary | ICD-10-CM | POA: Diagnosis not present

## 2021-03-12 DIAGNOSIS — L309 Dermatitis, unspecified: Secondary | ICD-10-CM | POA: Diagnosis not present

## 2021-03-12 DIAGNOSIS — L820 Inflamed seborrheic keratosis: Secondary | ICD-10-CM | POA: Diagnosis not present

## 2021-03-13 DIAGNOSIS — M62830 Muscle spasm of back: Secondary | ICD-10-CM | POA: Diagnosis not present

## 2021-03-13 DIAGNOSIS — M9901 Segmental and somatic dysfunction of cervical region: Secondary | ICD-10-CM | POA: Diagnosis not present

## 2021-03-13 DIAGNOSIS — M9903 Segmental and somatic dysfunction of lumbar region: Secondary | ICD-10-CM | POA: Diagnosis not present

## 2021-03-13 DIAGNOSIS — M9904 Segmental and somatic dysfunction of sacral region: Secondary | ICD-10-CM | POA: Diagnosis not present

## 2021-03-14 ENCOUNTER — Other Ambulatory Visit: Payer: Self-pay

## 2021-03-14 ENCOUNTER — Ambulatory Visit (INDEPENDENT_AMBULATORY_CARE_PROVIDER_SITE_OTHER): Payer: Medicare Other | Admitting: Family Medicine

## 2021-03-14 ENCOUNTER — Encounter: Payer: Self-pay | Admitting: Family Medicine

## 2021-03-14 VITALS — BP 130/67 | HR 62 | Temp 98.0°F | Resp 20 | Ht 60.0 in | Wt 172.4 lb

## 2021-03-14 DIAGNOSIS — R300 Dysuria: Secondary | ICD-10-CM | POA: Diagnosis not present

## 2021-03-14 DIAGNOSIS — N3000 Acute cystitis without hematuria: Secondary | ICD-10-CM

## 2021-03-14 LAB — URINALYSIS, COMPLETE
Bilirubin, UA: NEGATIVE
Glucose, UA: NEGATIVE
Ketones, UA: NEGATIVE
Nitrite, UA: NEGATIVE
Specific Gravity, UA: 1.015 (ref 1.005–1.030)
Urobilinogen, Ur: 0.2 mg/dL (ref 0.2–1.0)
pH, UA: 6 (ref 5.0–7.5)

## 2021-03-14 LAB — MICROSCOPIC EXAMINATION
Epithelial Cells (non renal): NONE SEEN /hpf (ref 0–10)
WBC, UA: >30 /hpf — AB (ref 0–5)

## 2021-03-14 MED ORDER — DOXYCYCLINE HYCLATE 100 MG PO TABS: 100.0000 mg | ORAL_TABLET | Freq: Two times a day (BID) | ORAL | 0 refills | Status: AC

## 2021-03-14 NOTE — Progress Notes (Signed)
Acute Office Visit  Subjective:    Patient ID: Misty Smith, female    DOB: [birth year], 80 y.o.   MRN: [id number]  Chief Complaint  Patient presents with   Urinary Tract Infection    HPI Patient is in today for dysuria and frequency x 2 days. She has also felt tired. Denies fever, chills, flank pain, abdominal pain, nausea, or vomiting. There is a history of UTIs.   Past Medical History:  Diagnosis Date   Allergy    Anxiety    Arthritis    Bronchitis    Cataract    Colovaginal fistula    Depression    Diabetes mellitus without complication (HCC)    Dysrhythmia    palpitations, occasional PVCs; Dr Stanford Breed cardiologist   Esophageal stricture    Fractured elbow 1970's   GERD (gastroesophageal reflux disease)    H/O hiatal hernia    Hyperlipidemia    Hypertension    Osteopenia    Perimenopausal vasomotor symptoms    Postmenopausal HRT (hormone replacement therapy)    Pre-diabetes    Stress    Subacute lupus erythematosus    Dr. Allyson Sabal    Past Surgical History:  Procedure Laterality Date   abdominal bypass  1987   fibroids    ABDOMINAL HYSTERECTOMY     partial   bilateral tubal ligtation  1972   CHOLECYSTECTOMY N/A 03/21/2014   Procedure: LAPAROSCOPIC CHOLECYSTECTOMY WITH INTRAOPERATIVE CHOLANGIOGRAM;  Surgeon: Shann Medal, MD;  Location: [facility];  Service: General;  Laterality: N/A;   COLONOSCOPY     elbow Right 1976   surgery after fracture of elbow   EYE SURGERY Bilateral    cataracts   ROTATOR CUFF REPAIR Right 03/29/2018   TONSILLECTOMY     TUBAL LIGATION      Family History  Problem Relation Age of Onset   Cancer Mother        laryngeal    Hypertension Mother    Heart disease Mother    Hypertension Father    Coronary artery disease Father    Kidney disease Father        kidney failure    Hypertension Sister    Cancer Sister        breast   Heart attack Sister    Heart disease Sister    Stroke Sister    Stroke Sister    Hypertension  Brother    Cancer Brother        liver   Hypertension Sister    Hyperlipidemia Sister    Hypertension Brother    Heart attack Brother    Hypertension Son    Hypertension Daughter     Social History   Socioeconomic History   Marital status: Widowed    Spouse name: Not on file   Number of children: 4   Years of education: Not on file   Highest education level: Not on file  Occupational History   Occupation: retired     Comment: [employer]   Tobacco Use   Smoking status: Never   Smokeless tobacco: Never  Vaping Use   Vaping Use: Never used  Substance and Sexual Activity   Alcohol use: No    Alcohol/week: 0.0 standard drinks   Drug use: No   Sexual activity: Never  Other Topics Concern   Not on file  Social History Narrative   Not on file   Social Determinants of Health   Financial Resource Strain: Not on file  Food  Insecurity: Not on file  Transportation Needs: Not on file  Physical Activity: Not on file  Stress: Not on file  Social Connections: Not on file  Intimate Partner Violence: Not on file    Outpatient Medications Prior to Visit  Medication Sig Dispense Refill   amLODipine (NORVASC) 10 MG tablet Take 1 tablet (10 mg total) by mouth daily. 90 tablet 3   aspirin EC 81 MG tablet Take 1 tablet (81 mg total) by mouth daily. 90 tablet 3   atorvastatin (LIPITOR) 80 MG tablet TAKE AS DIRECTED 90 tablet 1   Calcium Carb-Cholecalciferol 600-800 MG-UNIT TABS Take 1 tablet by mouth every evening.      cholecalciferol (VITAMIN D) 1000 UNITS tablet Take 2,000 Units by mouth daily.     escitalopram (LEXAPRO) 20 MG tablet Take 1 tablet (20 mg total) by mouth daily. 90 tablet 3   esomeprazole (NEXIUM) 40 MG capsule Take 1 capsule (40 mg total) by mouth daily. 90 capsule 3   estradiol (ESTRACE) 0.1 MG/GM vaginal cream Place vaginally.     hydrochlorothiazide (HYDRODIURIL) 12.5 MG tablet Take 1 tablet (12.5 mg total) by mouth daily. 90 tablet 3   irbesartan  (AVAPRO) 150 MG tablet Take 1 tablet (150 mg total) by mouth daily. 30 tablet 2   metoprolol tartrate (LOPRESSOR) 25 MG tablet Take 0.5 tablets (12.5 mg total) by mouth 2 (two) times daily. 90 tablet 3   Multiple Vitamin (MULTIVITAMIN) tablet Take 1 tablet by mouth every morning.      omega-3 acid ethyl esters (LOVAZA) 1 g capsule Take 2 capsules (2 g total) by mouth 2 (two) times daily. 360 capsule 3   pioglitazone (ACTOS) 30 MG tablet TAKE 1 TABLET BY MOUTH EVERY DAY 90 tablet 0   No facility-administered medications prior to visit.    Allergies  Allergen Reactions   Ciprofloxacin Rash   Codeine Rash   Iohexol      Desc: PT HAD HIVES AND WAS GIVEN BENEDRYL PO BY NURSE AND OBSERVED IN NURSES STATION AND RELEASED WITHOUT AND OTHER COMPLICATIONS SHEET SCANNED UNDER NURSES NOTES///ON 12/01/11 PT HAD ANOTHER CT WITH PREMEDS MINUS THE BENADRYL. SHE HAD A DELAYED REACTION AFTER WITH THROAT TIGHTNESS AND SOB, JB  12/07/12    Penicillins Hives   Sulfonamide Derivatives Hives   Asa [Aspirin] Other (See Comments)    Large amounts causes stomach pain   Keflex [Cephalexin] Nausea And Vomiting   Morphine And Related Rash    Review of Systems As per HPI.     Objective:    Physical Exam Vitals and nursing note reviewed.  Constitutional:      General: She is not in acute distress.    Appearance: She is not ill-appearing, toxic-appearing or diaphoretic.  Cardiovascular:     Rate and Rhythm: Normal rate and regular rhythm.     Heart sounds: Normal heart sounds. No murmur heard. Pulmonary:     Effort: Pulmonary effort is normal. No respiratory distress.     Breath sounds: Normal breath sounds.  Abdominal:     General: Bowel sounds are normal. There is no distension.     Palpations: Abdomen is soft.     Tenderness: There is abdominal tenderness (suprapubic). There is no right CVA tenderness, left CVA tenderness, guarding or rebound.  Skin:    General: Skin is warm and dry.  Neurological:      Mental Status: She is alert and oriented to person, place, and time.  Psychiatric:  Mood and Affect: Mood normal.        Behavior: Behavior normal.    BP 130/67   Pulse 62   Temp 98 F (36.7 C) (Temporal)   Resp 20   Ht 5' (1.524 m)   Wt 172 lb 6 oz (78.2 kg)   SpO2 95%   BMI 33.66 kg/m  Wt Readings from Last 3 Encounters:  03/14/21 172 lb 6 oz (78.2 kg)  02/07/21 174 lb 3.2 oz (79 kg)  01/25/21 172 lb (78 kg)   Urine dipstick shows positive for RBC's, positive for protein, and positive for leukocytes.  Micro exam: >30 WBC's per HPF, 0-2 RBC's per HPF, and few+ bacteria.   Health Maintenance Due  Topic Date Due   COVID-19 Vaccine (4 - Booster) 11/14/2020    There are no preventive care reminders to display for this patient.   Lab Results  Component Value Date   TSH 1.510 11/27/2020   Lab Results  Component Value Date   WBC 6.0 11/27/2020   HGB 12.0 11/27/2020   HCT 35.3 11/27/2020   MCV 94 11/27/2020   PLT 229 11/27/2020   Lab Results  Component Value Date   NA 137 11/27/2020   K 4.8 11/27/2020   CO2 23 11/27/2020   GLUCOSE 101 (H) 11/27/2020   BUN 15 11/27/2020   CREATININE 0.75 11/27/2020   BILITOT 0.3 11/27/2020   ALKPHOS 83 11/27/2020   AST 33 11/27/2020   ALT 25 11/27/2020   PROT 6.9 11/27/2020   ALBUMIN 4.3 11/27/2020   CALCIUM 9.4 11/27/2020   ANIONGAP 14 03/16/2014   EGFR 80 11/27/2020   Lab Results  Component Value Date   CHOL 150 11/27/2020   Lab Results  Component Value Date   HDL 57 11/27/2020   Lab Results  Component Value Date   LDLCALC 70 11/27/2020   Lab Results  Component Value Date   TRIG 132 11/27/2020   Lab Results  Component Value Date   CHOLHDL 2.6 11/27/2020   Lab Results  Component Value Date   HGBA1C 6.1 11/27/2020       Assessment & Plan:   Forrestine was seen today for urinary tract infection.  Diagnoses and all orders for this visit:  Acute cystitis without hematuria Culture pending. Start  doxycycline as below as patient has multiple allergies. Return to office for new or worsening symptoms, or if symptoms persist.  -     Urinalysis, Complete -     Urine Culture -     doxycycline (VIBRA-TABS) 100 MG tablet; Take 1 tablet (100 mg total) by mouth 2 (two) times daily for 7 days. 1 po bid  The patient indicates understanding of these issues and agrees with the plan.   Gwenlyn Perking, FNP

## 2021-03-14 NOTE — Patient Instructions (Signed)
Urinary Tract Infection, Adult A urinary tract infection (UTI) is an infection of any part of the urinary tract. The urinary tract includes the kidneys, ureters, bladder, and urethra. These organs make, store, and get rid of urine in the body. An upper UTI affects the ureters and kidneys. A lower UTI affects the bladder and urethra. What are the causes? Most urinary tract infections are caused by bacteria in your genital area around your urethra, where urine leaves your body. These bacteria grow and cause inflammation of your urinary tract. What increases the risk? You are more likely to develop this condition if: You have a urinary catheter that stays in place. You are not able to control when you urinate or have a bowel movement (incontinence). You are female and you: Use a spermicide or diaphragm for birth control. Have low estrogen levels. Are pregnant. You have certain genes that increase your risk. You are sexually active. You take antibiotic medicines. You have a condition that causes your flow of urine to slow down, such as: An enlarged prostate, if you are female. Blockage in your urethra. A kidney stone. A nerve condition that affects your bladder control (neurogenic bladder). Not getting enough to drink, or not urinating often. You have certain medical conditions, such as: Diabetes. A weak disease-fighting system (immunesystem). Sickle cell disease. Gout. Spinal cord injury. What are the signs or symptoms? Symptoms of this condition include: Needing to urinate right away (urgency). Frequent urination. This may include small amounts of urine each time you urinate. Pain or burning with urination. Blood in the urine. Urine that smells bad or unusual. Trouble urinating. Cloudy urine. Vaginal discharge, if you are female. Pain in the abdomen or the lower back. You may also have: Vomiting or a decreased appetite. Confusion. Irritability or tiredness. A fever or  chills. Diarrhea. The first symptom in older adults may be confusion. In some cases, they may not have any symptoms until the infection has worsened. How is this diagnosed? This condition is diagnosed based on your medical history and a physical exam. You may also have other tests, including: Urine tests. Blood tests. Tests for STIs (sexually transmitted infections). If you have had more than one UTI, a cystoscopy or imaging studies may be done to determine the cause of the infections. How is this treated? Treatment for this condition includes: Antibiotic medicine. Over-the-counter medicines to treat discomfort. Drinking enough water to stay hydrated. If you have frequent infections or have other conditions such as a kidney stone, you may need to see a health care provider who specializes in the urinary tract (urologist). In rare cases, urinary tract infections can cause sepsis. Sepsis is a life-threatening condition that occurs when the body responds to an infection. Sepsis is treated in the hospital with IV antibiotics, fluids, and other medicines. Follow these instructions at home: Medicines Take over-the-counter and prescription medicines only as told by your health care provider. If you were prescribed an antibiotic medicine, take it as told by your health care provider. Do not stop using the antibiotic even if you start to feel better. General instructions Make sure you: Empty your bladder often and completely. Do not hold urine for long periods of time. Empty your bladder after sex. Wipe from front to back after urinating or having a bowel movement if you are female. Use each tissue only one time when you wipe. Drink enough fluid to keep your urine pale yellow. Keep all follow-up visits. This is important. Contact a health care provider   if: Your symptoms do not get better after 1-2 days. Your symptoms go away and then return. Get help right away if: You have severe pain in your  back or your lower abdomen. You have a fever or chills. You have nausea or vomiting. Summary A urinary tract infection (UTI) is an infection of any part of the urinary tract, which includes the kidneys, ureters, bladder, and urethra. Most urinary tract infections are caused by bacteria in your genital area. Treatment for this condition often includes antibiotic medicines. If you were prescribed an antibiotic medicine, take it as told by your health care provider. Do not stop using the antibiotic even if you start to feel better. Keep all follow-up visits. This is important. This information is not intended to replace advice given to you by your health care provider. Make sure you discuss any questions you have with your health care provider. Document Revised: 03/30/2020 Document Reviewed: 03/30/2020 Elsevier Patient Education  2022 Elsevier Inc.  

## 2021-03-16 ENCOUNTER — Other Ambulatory Visit: Payer: Self-pay | Admitting: Family Medicine

## 2021-03-17 LAB — URINE CULTURE

## 2021-03-19 DIAGNOSIS — E1142 Type 2 diabetes mellitus with diabetic polyneuropathy: Secondary | ICD-10-CM | POA: Diagnosis not present

## 2021-03-19 DIAGNOSIS — L84 Corns and callosities: Secondary | ICD-10-CM | POA: Diagnosis not present

## 2021-03-19 DIAGNOSIS — B351 Tinea unguium: Secondary | ICD-10-CM | POA: Diagnosis not present

## 2021-03-19 DIAGNOSIS — M79676 Pain in unspecified toe(s): Secondary | ICD-10-CM | POA: Diagnosis not present

## 2021-03-20 DIAGNOSIS — M9903 Segmental and somatic dysfunction of lumbar region: Secondary | ICD-10-CM | POA: Diagnosis not present

## 2021-03-20 DIAGNOSIS — M9904 Segmental and somatic dysfunction of sacral region: Secondary | ICD-10-CM | POA: Diagnosis not present

## 2021-03-20 DIAGNOSIS — M62830 Muscle spasm of back: Secondary | ICD-10-CM | POA: Diagnosis not present

## 2021-03-20 DIAGNOSIS — M9901 Segmental and somatic dysfunction of cervical region: Secondary | ICD-10-CM | POA: Diagnosis not present

## 2021-03-26 DIAGNOSIS — M9903 Segmental and somatic dysfunction of lumbar region: Secondary | ICD-10-CM | POA: Diagnosis not present

## 2021-03-26 DIAGNOSIS — M9904 Segmental and somatic dysfunction of sacral region: Secondary | ICD-10-CM | POA: Diagnosis not present

## 2021-03-26 DIAGNOSIS — M9901 Segmental and somatic dysfunction of cervical region: Secondary | ICD-10-CM | POA: Diagnosis not present

## 2021-03-26 DIAGNOSIS — M62830 Muscle spasm of back: Secondary | ICD-10-CM | POA: Diagnosis not present

## 2021-03-27 ENCOUNTER — Encounter: Payer: Self-pay | Admitting: Internal Medicine

## 2021-03-27 ENCOUNTER — Other Ambulatory Visit: Payer: Self-pay

## 2021-03-27 ENCOUNTER — Other Ambulatory Visit (INDEPENDENT_AMBULATORY_CARE_PROVIDER_SITE_OTHER): Payer: Medicare Other

## 2021-03-27 ENCOUNTER — Ambulatory Visit (INDEPENDENT_AMBULATORY_CARE_PROVIDER_SITE_OTHER): Payer: Medicare Other | Admitting: Internal Medicine

## 2021-03-27 DIAGNOSIS — I2510 Atherosclerotic heart disease of native coronary artery without angina pectoris: Secondary | ICD-10-CM

## 2021-03-27 DIAGNOSIS — R058 Other specified cough: Secondary | ICD-10-CM | POA: Diagnosis not present

## 2021-03-27 DIAGNOSIS — R918 Other nonspecific abnormal finding of lung field: Secondary | ICD-10-CM

## 2021-03-27 DIAGNOSIS — I10 Essential (primary) hypertension: Secondary | ICD-10-CM

## 2021-03-27 LAB — CBC WITH DIFFERENTIAL/PLATELET
Basophils Absolute: 0 10*3/uL (ref 0.0–0.1)
Basophils Relative: 0.4 % (ref 0.0–3.0)
Eosinophils Absolute: 0.1 10*3/uL (ref 0.0–0.7)
Eosinophils Relative: 1.5 % (ref 0.0–5.0)
HCT: 32.3 % — ABNORMAL LOW (ref 36.0–46.0)
Hemoglobin: 11 g/dL — ABNORMAL LOW (ref 12.0–15.0)
Lymphocytes Relative: 20.8 % (ref 12.0–46.0)
Lymphs Abs: 1.5 10*3/uL (ref 0.7–4.0)
MCHC: 34.2 g/dL (ref 30.0–36.0)
MCV: 94.6 fl (ref 78.0–100.0)
Monocytes Absolute: 0.9 10*3/uL (ref 0.1–1.0)
Monocytes Relative: 11.8 % (ref 3.0–12.0)
Neutro Abs: 4.7 10*3/uL (ref 1.4–7.7)
Neutrophils Relative %: 65.5 % (ref 43.0–77.0)
Platelets: 253 10*3/uL (ref 150.0–400.0)
RBC: 3.41 Mil/uL — ABNORMAL LOW (ref 3.87–5.11)
RDW: 13.4 % (ref 11.5–15.5)
WBC: 7.2 10*3/uL (ref 4.0–10.5)

## 2021-03-27 LAB — SEDIMENTATION RATE: Sed Rate: 86 mm/hr — ABNORMAL HIGH (ref 0–30)

## 2021-03-27 NOTE — Assessment & Plan Note (Addendum)
Try off acei 02/07/2021  > some better 03/27/2021 but still throat clearing so rec add pepcid 20 mg p supper and zyrtec prn  -  Allergy profile 03/27/2021 >  Eos 0.1 /  IgE  Pending      She's really not that bothered by the cough at this point so further w/u can be deferred to PCP pending IgE level.

## 2021-03-27 NOTE — Progress Notes (Signed)
Misty Smith, female    DOB: Jul 08, 1941    MRN: [id number]   Brief patient profile:  10 yowf never smoker mostly spring /fall rhinitis in 40's on ACEi with cough attributed to pnds referred to pulmonary clinic 02/07/2021 by Dr Dettinger with a more severe cough since pna dx feb 2022 and no  better p several rounds abx.     History of Present Illness  02/07/2021  Pulmonary/ 1st office eval/Laiah Pouncey  Chief Complaint  Patient presents with   Pulmonary Consult    Referred by Dr Warrick Parisian.  Pt states had PNA back in Feb 2022 and has been txed with 2 courses of abx since then. She has occ cough, occ prod with clear sputum.   Dyspnea:  not limited but very sedentary Cough: worse when out in heat / p [place] food, min mucoid production, better with cough drops Sleep: bed is flat with one pillow  SABA use: none Rec Stop lisinopril and start avapro (ibesartan) 150 mg one daily  - if bp too low esp less 110 on top number , break amlodipine and take a half - if bp too high (or 140 top number) , take ibesartan 150 mg twice daily  Continue nexium 40 mg Take 30-60 min before first meal of the day. GERD diet reviewed, bed blocks rec    03/27/2021  f/u ov/Dannelle Rhymes re:  UACS ? ACEi case vs gerd vs rhinitis plus abn cxr Chief Complaint  Patient presents with   Follow-up    Pt states cough "may be a little better" still occ produces some mucus- clear.   Dyspnea:  limited by L knee,  4 pronged walker  Cough: some better still clearing throat due to "drainage" s mucus production and note  s noct symptoms  Sleeping: bed is elevated a bit no sleeping issues  SABA use: nonee 02: none Covid status:   x 3 vax    No obvious day to day or daytime variability or assoc excess/ purulent sputum or mucus plugs or hemoptysis or cp or chest tightness, subjective wheeze or overt  hb symptoms.   Sleeping  without nocturnal  or early am exacerbation  of respiratory  c/o's or need for noct saba. Also denies any obvious  fluctuation of symptoms with weather or environmental changes or other aggravating or alleviating factors except as outlined above   No unusual exposure hx or h/o childhood pna/ asthma or knowledge of premature birth.  Current Allergies, Complete Past Medical History, Past Surgical History, Family History, and Social History were reviewed in [employer] record.  ROS  The following are not active complaints unless bolded Hoarseness, sore throat, dysphagia, dental problems, itching, sneezing,  nasal congestion or discharge of excess mucus or purulent secretions, ear ache,   fever, chills, sweats, unintended wt loss or wt gain, classically pleuritic or exertional cp,  orthopnea pnd or arm/hand swelling  or leg swelling, presyncope, palpitations, abdominal pain, anorexia, nausea, vomiting, diarrhea  or change in bowel habits or change in bladder habits, change in stools or change in urine, dysuria, hematuria,  rash, arthralgias, visual complaints, headache, numbness, weakness or ataxia or problems with walking or coordination,  change in mood or  memory.        Current Meds  Medication Sig   amLODipine (NORVASC) 10 MG tablet Take 1 tablet (10 mg total) by mouth daily.   aspirin EC 81 MG tablet Take 1 tablet (81 mg total) by mouth daily.  atorvastatin (LIPITOR) 80 MG tablet TAKE AS DIRECTED   Calcium Carb-Cholecalciferol 600-800 MG-UNIT TABS Take 1 tablet by mouth every evening.    cholecalciferol (VITAMIN D) 1000 UNITS tablet Take 2,000 Units by mouth daily.   clindamycin (CLEOCIN) 150 MG capsule Take 2 capsules by mouth in the morning, at noon, and at bedtime.   escitalopram (LEXAPRO) 20 MG tablet Take 1 tablet (20 mg total) by mouth daily.   esomeprazole (NEXIUM) 40 MG capsule Take 1 capsule (40 mg total) by mouth daily.   estradiol (ESTRACE) 0.1 MG/GM vaginal cream Place vaginally.   hydrochlorothiazide (HYDRODIURIL) 12.5 MG tablet Take 1 tablet (12.5 mg total) by mouth  daily.   irbesartan (AVAPRO) 150 MG tablet Take 1 tablet (150 mg total) by mouth daily.   metoprolol tartrate (LOPRESSOR) 25 MG tablet Take 0.5 tablets (12.5 mg total) by mouth 2 (two) times daily.   Multiple Vitamin (MULTIVITAMIN) tablet Take 1 tablet by mouth every morning.    omega-3 acid ethyl esters (LOVAZA) 1 g capsule Take 2 capsules (2 g total) by mouth 2 (two) times daily.   pioglitazone (ACTOS) 30 MG tablet TAKE 1 TABLET BY MOUTH EVERY DAY                    Past Medical History:  Diagnosis Date   Allergy    Anxiety    Arthritis    Bronchitis    Cataract    Colovaginal fistula    Depression    Diabetes mellitus without complication (HCC)    Dysrhythmia    palpitations, occasional PVCs; Dr Stanford Breed cardiologist   Esophageal stricture    Fractured elbow 1970's   GERD (gastroesophageal reflux disease)    H/O hiatal hernia    Hyperlipidemia    Hypertension    Osteopenia    Perimenopausal vasomotor symptoms    Postmenopausal HRT (hormone replacement therapy)    Pre-diabetes    Stress    Subacute lupus erythematosus    Dr. Allyson Sabal      Objective:       Wt Readings from Last 3 Encounters:  03/27/21 173 lb 3.2 oz (78.6 kg)  03/14/21 172 lb 6 oz (78.2 kg)  02/07/21 174 lb 3.2 oz (79 kg)      Vital signs reviewed  03/27/2021  - Note at rest 02 sats  96% on RA   General appearance:    amb (with 4 pronged cane) wf nad  still freq throat clearing    HEENT : pt wearing mask not removed for exam due to covid -19 concerns.    NECK :  without JVD/Nodes/TM/ nl carotid upstrokes bilaterally   LUNGS: no acc muscle use,  Nl contour chest which is clear to A and P bilaterally without cough on insp or exp maneuvers   CV:  RRR  no s3 or murmur or increase in P2, and no edema   ABD:  soft and nontender with nl inspiratory excursion in the supine position. No bruits or organomegaly appreciated, bowel sounds nl  MS:    ext warm without deformities, calf tenderness,  cyanosis or clubbing No obvious joint restrictions   SKIN: warm and dry without lesions    NEURO:  alert, approp, nl sensorium with  no motor or cerebellar deficits apparent.    Labs ordered 03/27/2021  :  allergy profile    Quant GOLD TB    Lab Results  Component Value Date   ESRSEDRATE 86 (H) 03/27/2021  Assessment

## 2021-03-27 NOTE — Patient Instructions (Signed)
If cough / drainage/ sinus problems > try clariton and if not effective then try zyrtec as needed  Pepcid  20 mg (over the counter)  one after the supper and take a whole month daily to see if helps your drainage  Please remember to go to the lab department   for your tests - we will call you with the results when they are available.       If you are satisfied with your treatment plan,  let your doctor know and he/she can either refill your medications or you can return here when your prescription runs out.     If in any way you are not 100% satisfied,  please tell us.  If 100% better, tell your friends!  Pulmonary follow up is as needed

## 2021-03-27 NOTE — Assessment & Plan Note (Signed)
Trial off acei 02/07/2021 due to cough > some better 03/27/2021   Although even in retrospect it may not be clear the ACEi contributed to the pt's symptoms,  Pt improved off them and adding them back at this point or in the future would risk confusion in interpretation of non-specific respiratory symptoms to which this patient is prone  ie  Better not to muddy the waters here.   >>> f/u pcp          Each maintenance medication was reviewed in detail including emphasizing most importantly the difference between maintenance and prns and under what circumstances the prns are to be triggered using an action plan format where appropriate.  Total time for H and P, chart review, counseling,   and generating customized AVS unique to this summary  office visit / same day charting =  32 min

## 2021-03-28 ENCOUNTER — Encounter: Payer: Self-pay | Admitting: Internal Medicine

## 2021-03-28 ENCOUNTER — Other Ambulatory Visit: Payer: Self-pay

## 2021-03-28 ENCOUNTER — Other Ambulatory Visit: Payer: Medicare Other

## 2021-03-28 DIAGNOSIS — R7303 Prediabetes: Secondary | ICD-10-CM

## 2021-03-28 DIAGNOSIS — E782 Mixed hyperlipidemia: Secondary | ICD-10-CM

## 2021-03-28 DIAGNOSIS — I10 Essential (primary) hypertension: Secondary | ICD-10-CM | POA: Diagnosis not present

## 2021-03-28 LAB — BAYER DCA HB A1C WAIVED: HB A1C (BAYER DCA - WAIVED): 6.3 % (ref <7.0)

## 2021-03-28 NOTE — Assessment & Plan Note (Signed)
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    As per prev note reviewed with them: This is an extremely common benign condition in the elderly and does not warrant aggressive eval/ rx at this point unless there is a clinical correlation suggesting unaddressed pulmonary infection (purulent sputum, night sweats, unintended wt loss, doe) or evolution of  obvious changes on plain cxr (as opposed to serial CT, which is way over sensitive to make clinical decisions re intervention and treatment in the elderly, who tend to tolerate both dx and treatment poorly) .   F/u can be prn

## 2021-03-29 LAB — CMP14+EGFR
ALT: 14 IU/L (ref 0–32)
AST: 22 IU/L (ref 0–40)
Albumin/Globulin Ratio: 1.5 (ref 1.2–2.2)
Albumin: 3.9 g/dL (ref 3.6–4.6)
Alkaline Phosphatase: 74 IU/L (ref 44–121)
BUN/Creatinine Ratio: 23 (ref 12–28)
BUN: 15 mg/dL (ref 8–27)
Bilirubin Total: 0.4 mg/dL (ref 0.0–1.2)
CO2: 22 mmol/L (ref 20–29)
Calcium: 9.9 mg/dL (ref 8.7–10.3)
Chloride: 94 mmol/L — ABNORMAL LOW (ref 96–106)
Creatinine, Ser: 0.65 mg/dL (ref 0.57–1.00)
Globulin, Total: 2.6 g/dL (ref 1.5–4.5)
Glucose: 107 mg/dL — ABNORMAL HIGH (ref 65–99)
Potassium: 4.8 mmol/L (ref 3.5–5.2)
Sodium: 133 mmol/L — ABNORMAL LOW (ref 134–144)
Total Protein: 6.5 g/dL (ref 6.0–8.5)
eGFR: 88 mL/min/{1.73_m2} (ref >59)

## 2021-03-29 LAB — CBC WITH DIFFERENTIAL/PLATELET
Basophils Absolute: 0 10*3/uL (ref 0.0–0.2)
Basos: 1 %
EOS (ABSOLUTE): 0.1 10*3/uL (ref 0.0–0.4)
Eos: 2 %
Hematocrit: 33.8 % — ABNORMAL LOW (ref 34.0–46.6)
Hemoglobin: 11.3 g/dL (ref 11.1–15.9)
Immature Grans (Abs): 0 10*3/uL (ref 0.0–0.1)
Immature Granulocytes: 0 %
Lymphocytes Absolute: 1.4 10*3/uL (ref 0.7–3.1)
Lymphs: 22 %
MCH: 31.4 pg (ref 26.6–33.0)
MCHC: 33.4 g/dL (ref 31.5–35.7)
MCV: 94 fL (ref 79–97)
Monocytes Absolute: 0.8 10*3/uL (ref 0.1–0.9)
Monocytes: 12 %
Neutrophils Absolute: 4.2 10*3/uL (ref 1.4–7.0)
Neutrophils: 63 %
Platelets: 274 10*3/uL (ref 150–450)
RBC: 3.6 x10E6/uL — ABNORMAL LOW (ref 3.77–5.28)
RDW: 12.5 % (ref 11.7–15.4)
WBC: 6.6 10*3/uL (ref 3.4–10.8)

## 2021-03-29 LAB — LIPID PANEL
Chol/HDL Ratio: 2.5 ratio (ref 0.0–4.4)
Cholesterol, Total: 129 mg/dL (ref 100–199)
HDL: 51 mg/dL (ref >39)
LDL Chol Calc (NIH): 55 mg/dL (ref 0–99)
Triglycerides: 135 mg/dL (ref 0–149)
VLDL Cholesterol Cal: 23 mg/dL (ref 5–40)

## 2021-03-29 LAB — TSH: TSH: 1.17 u[IU]/mL (ref 0.450–4.500)

## 2021-03-30 LAB — QUANTIFERON-TB GOLD PLUS
Mitogen-NIL: 6.96 IU/mL
NIL: 0.14 IU/mL
QuantiFERON-TB Gold Plus: NEGATIVE
TB1-NIL: <0 IU/mL
TB2-NIL: <0 IU/mL

## 2021-03-30 LAB — IGE: IgE (Immunoglobulin E), Serum: 80 kU/L (ref <=114)

## 2021-04-01 ENCOUNTER — Other Ambulatory Visit: Payer: Self-pay

## 2021-04-01 ENCOUNTER — Ambulatory Visit (INDEPENDENT_AMBULATORY_CARE_PROVIDER_SITE_OTHER): Payer: Medicare Other | Admitting: Family Medicine

## 2021-04-01 ENCOUNTER — Encounter: Payer: Self-pay | Admitting: Family Medicine

## 2021-04-01 VITALS — BP 118/57 | HR 83 | Ht 60.0 in | Wt 171.0 lb

## 2021-04-01 DIAGNOSIS — R7303 Prediabetes: Secondary | ICD-10-CM

## 2021-04-01 DIAGNOSIS — E7800 Pure hypercholesterolemia, unspecified: Secondary | ICD-10-CM

## 2021-04-01 DIAGNOSIS — I10 Essential (primary) hypertension: Secondary | ICD-10-CM | POA: Diagnosis not present

## 2021-04-01 DIAGNOSIS — K219 Gastro-esophageal reflux disease without esophagitis: Secondary | ICD-10-CM

## 2021-04-01 DIAGNOSIS — I2510 Atherosclerotic heart disease of native coronary artery without angina pectoris: Secondary | ICD-10-CM

## 2021-04-01 DIAGNOSIS — F411 Generalized anxiety disorder: Secondary | ICD-10-CM | POA: Diagnosis not present

## 2021-04-01 MED ORDER — ESCITALOPRAM OXALATE 20 MG PO TABS: 20.0000 mg | ORAL_TABLET | Freq: Every day | ORAL | 3 refills | Status: DC

## 2021-04-01 MED ORDER — PIOGLITAZONE HCL 30 MG PO TABS: 30.0000 mg | ORAL_TABLET | Freq: Every day | ORAL | 3 refills | Status: DC

## 2021-04-01 MED ORDER — HYDROCHLOROTHIAZIDE 12.5 MG PO TABS: 12.5000 mg | ORAL_TABLET | Freq: Every day | ORAL | 3 refills | Status: DC

## 2021-04-01 MED ORDER — ESOMEPRAZOLE MAGNESIUM 40 MG PO CPDR: 40.0000 mg | DELAYED_RELEASE_CAPSULE | Freq: Every day | ORAL | 3 refills | Status: DC

## 2021-04-01 MED ORDER — ATORVASTATIN CALCIUM 80 MG PO TABS: 80.0000 mg | ORAL_TABLET | Freq: Every day | ORAL | 3 refills | Status: DC

## 2021-04-01 NOTE — Progress Notes (Signed)
BP (!) 118/57   Pulse 83   Ht 5' (1.524 m)   Wt 171 lb (77.6 kg)   SpO2 98%   BMI 33.40 kg/m    Subjective:   Patient ID: Misty Smith, female    DOB: February 13, 1941, 80 y.o.   MRN: [id number]  HPI: Misty Smith is a 80 y.o. female presenting on 04/01/2021 for Medical Management of Chronic Issues and Hypertension   HPI Prediabetes Patient comes in today for recheck of his diabetes. Patient has been currently taking Actos. Patient is currently on an ACE inhibitor/ARB. Patient has seen an ophthalmologist this year. Patient denies any issues with their feet. The symptom started onset as an adult hyperlipidemia and hypertension ARE RELATED TO DM   Hyperlipidemia Patient is coming in for recheck of his hyperlipidemia. The patient is currently taking fish oil and atorvastatin. They deny any issues with myalgias or history of liver damage from it. They deny any focal numbness or weakness or chest pain.   Hypertension Patient is currently on metoprolol and irbesartan and hydrochlorothiazide and amlodipine, and their blood pressure today is 118/57. Patient has occasional lightheadedness or dizziness. Patient denies headaches, blurred vision, chest pains, shortness of breath, or weakness. Denies any side effects from medication and is content with current medication.   Anxiety recheck Patient is currently on Lexapro for anxiety and says it has been doing well for feels like her anxiety is under control.  Relevant past medical, surgical, family and social history reviewed and updated as indicated. Interim medical history since our last visit reviewed. Allergies and medications reviewed and updated.  Review of Systems  Constitutional:  Negative for chills and fever.  Eyes:  Negative for visual disturbance.  Respiratory:  Negative for chest tightness and shortness of breath.   Cardiovascular:  Negative for chest pain and leg swelling.  Musculoskeletal:  Negative for back pain and gait  problem.  Skin:  Negative for rash.  Neurological:  Negative for light-headedness and headaches.  Psychiatric/Behavioral:  Negative for agitation and behavioral problems.   All other systems reviewed and are negative.  Per HPI unless specifically indicated above   Allergies as of 04/01/2021       Reactions   Ciprofloxacin Rash   Codeine Rash   Iohexol     Desc: PT HAD HIVES AND WAS GIVEN BENEDRYL PO BY NURSE AND OBSERVED IN NURSES STATION AND RELEASED WITHOUT AND OTHER COMPLICATIONS SHEET SCANNED UNDER NURSES NOTES///ON 12/01/11 PT HAD ANOTHER CT WITH PREMEDS MINUS THE BENADRYL. SHE HAD A DELAYED REACTION AFTER WITH THROAT TIGHTNESS AND SOB, JB  12/07/12   Penicillins Hives   Sulfonamide Derivatives Hives   Asa [aspirin] Other (See Comments)   Large amounts causes stomach pain   Keflex [cephalexin] Nausea And Vomiting   Morphine And Related Rash        Medication List        Accurate as of April 01, 2021  9:10 AM. If you have any questions, ask your nurse or doctor.          amLODipine 10 MG tablet Commonly known as: NORVASC Take 1 tablet (10 mg total) by mouth daily.   aspirin EC 81 MG tablet Take 1 tablet (81 mg total) by mouth daily.   atorvastatin 80 MG tablet Commonly known as: LIPITOR Take 1 tablet (80 mg total) by mouth at bedtime. What changed:  how much to take how to take this when to take this Changed by: Fransisca Kaufmann Mee Macdonnell,  MD   Calcium Carb-Cholecalciferol 600-800 MG-UNIT Tabs Take 1 tablet by mouth every evening.   cholecalciferol 1000 units tablet Commonly known as: VITAMIN D Take 2,000 Units by mouth daily.   clindamycin 150 MG capsule Commonly known as: CLEOCIN Take 2 capsules by mouth in the morning, at noon, and at bedtime.   escitalopram 20 MG tablet Commonly known as: LEXAPRO Take 1 tablet (20 mg total) by mouth daily.   esomeprazole 40 MG capsule Commonly known as: NEXIUM Take 1 capsule (40 mg total) by mouth daily.   estradiol  0.1 MG/GM vaginal cream Commonly known as: ESTRACE Place vaginally.   hydrochlorothiazide 12.5 MG tablet Commonly known as: HYDRODIURIL Take 1 tablet (12.5 mg total) by mouth daily.   irbesartan 150 MG tablet Commonly known as: Avapro Take 1 tablet (150 mg total) by mouth daily.   metoprolol tartrate 25 MG tablet Commonly known as: LOPRESSOR Take 0.5 tablets (12.5 mg total) by mouth 2 (two) times daily.   multivitamin tablet Take 1 tablet by mouth every morning.   omega-3 acid ethyl esters 1 g capsule Commonly known as: LOVAZA Take 2 capsules (2 g total) by mouth 2 (two) times daily.   pioglitazone 30 MG tablet Commonly known as: ACTOS Take 1 tablet (30 mg total) by mouth daily.         Objective:   BP (!) 118/57   Pulse 83   Ht 5' (1.524 m)   Wt 171 lb (77.6 kg)   SpO2 98%   BMI 33.40 kg/m   Wt Readings from Last 3 Encounters:  04/01/21 171 lb (77.6 kg)  03/27/21 173 lb 3.2 oz (78.6 kg)  03/14/21 172 lb 6 oz (78.2 kg)    Physical Exam Vitals and nursing note reviewed.  Constitutional:      General: She is not in acute distress.    Appearance: She is well-developed. She is not diaphoretic.  Eyes:     Conjunctiva/sclera: Conjunctivae normal.  Cardiovascular:     Rate and Rhythm: Normal rate and regular rhythm.     Heart sounds: Normal heart sounds. No murmur heard. Pulmonary:     Effort: Pulmonary effort is normal. No respiratory distress.     Breath sounds: Normal breath sounds. No wheezing.  Musculoskeletal:        General: No tenderness. Normal range of motion.  Skin:    General: Skin is warm and dry.     Findings: No rash.  Neurological:     Mental Status: She is alert and oriented to person, place, and time.     Coordination: Coordination normal.  Psychiatric:        Behavior: Behavior normal.    Results for orders placed or performed in visit on 03/28/21  Bayer DCA Hb A1c Waived  Result Value Ref Range   HB A1C (BAYER DCA - WAIVED) 6.3  <7.0 %  TSH  Result Value Ref Range   TSH 1.170 0.450 - 4.500 uIU/mL  Lipid panel  Result Value Ref Range   Cholesterol, Total 129 100 - 199 mg/dL   Triglycerides 135 0 - 149 mg/dL   HDL 51 >39 mg/dL   VLDL Cholesterol Cal 23 5 - 40 mg/dL   LDL Chol Calc (NIH) 55 0 - 99 mg/dL   Chol/HDL Ratio 2.5 0.0 - 4.4 ratio  CMP14+EGFR  Result Value Ref Range   Glucose 107 (H) 65 - 99 mg/dL   BUN 15 8 - 27 mg/dL   Creatinine, Ser 0.65 0.57 -  1.00 mg/dL   eGFR 88 >59 mL/min/1.73   BUN/Creatinine Ratio 23 12 - 28   Sodium 133 (L) 134 - 144 mmol/L   Potassium 4.8 3.5 - 5.2 mmol/L   Chloride 94 (L) 96 - 106 mmol/L   CO2 22 20 - 29 mmol/L   Calcium 9.9 8.7 - 10.3 mg/dL   Total Protein 6.5 6.0 - 8.5 g/dL   Albumin 3.9 3.6 - 4.6 g/dL   Globulin, Total 2.6 1.5 - 4.5 g/dL   Albumin/Globulin Ratio 1.5 1.2 - 2.2   Bilirubin Total 0.4 0.0 - 1.2 mg/dL   Alkaline Phosphatase 74 44 - 121 IU/L   AST 22 0 - 40 IU/L   ALT 14 0 - 32 IU/L  CBC with Differential/Platelet  Result Value Ref Range   WBC 6.6 3.4 - 10.8 x10E3/uL   RBC 3.60 (L) 3.77 - 5.28 x10E6/uL   Hemoglobin 11.3 11.1 - 15.9 g/dL   Hematocrit 33.8 (L) 34.0 - 46.6 %   MCV 94 79 - 97 fL   MCH 31.4 26.6 - 33.0 pg   MCHC 33.4 31.5 - 35.7 g/dL   RDW 12.5 11.7 - 15.4 %   Platelets 274 150 - 450 x10E3/uL   Neutrophils 63 Not Estab. %   Lymphs 22 Not Estab. %   Monocytes 12 Not Estab. %   Eos 2 Not Estab. %   Basos 1 Not Estab. %   Neutrophils Absolute 4.2 1.4 - 7.0 x10E3/uL   Lymphocytes Absolute 1.4 0.7 - 3.1 x10E3/uL   Monocytes Absolute 0.8 0.1 - 0.9 x10E3/uL   EOS (ABSOLUTE) 0.1 0.0 - 0.4 x10E3/uL   Basophils Absolute 0.0 0.0 - 0.2 x10E3/uL   Immature Granulocytes 0 Not Estab. %   Immature Grans (Abs) 0.0 0.0 - 0.1 x10E3/uL    Assessment & Plan:   Problem List Items Addressed This Visit       Cardiovascular and Mediastinum   Essential hypertension   Relevant Medications   atorvastatin (LIPITOR) 80 MG tablet    hydrochlorothiazide (HYDRODIURIL) 12.5 MG tablet     Digestive   GERD (gastroesophageal reflux disease)   Relevant Medications   esomeprazole (NEXIUM) 40 MG capsule     Other   Pre-diabetes - Primary   Hyperlipidemia   Relevant Medications   atorvastatin (LIPITOR) 80 MG tablet   hydrochlorothiazide (HYDRODIURIL) 12.5 MG tablet   Generalized anxiety disorder   Relevant Medications   escitalopram (LEXAPRO) 20 MG tablet    Recommended for her to start cutting her irbesartan in half and only take 75 mg daily, BP slightly on the lower side today  Continue other medications, no changes. Follow up plan: Return in about 4 months (around 08/01/2021), or if symptoms worsen or fail to improve, for Diabetes chronic medical management.  Counseling provided for all of the vaccine components No orders of the defined types were placed in this encounter.   Caryl Pina, MD [facility] 04/01/2021, 9:10 AM

## 2021-04-02 ENCOUNTER — Encounter: Payer: Self-pay | Admitting: *Deleted

## 2021-04-03 DIAGNOSIS — M62830 Muscle spasm of back: Secondary | ICD-10-CM | POA: Diagnosis not present

## 2021-04-03 DIAGNOSIS — M9904 Segmental and somatic dysfunction of sacral region: Secondary | ICD-10-CM | POA: Diagnosis not present

## 2021-04-03 DIAGNOSIS — M9903 Segmental and somatic dysfunction of lumbar region: Secondary | ICD-10-CM | POA: Diagnosis not present

## 2021-04-03 DIAGNOSIS — M9901 Segmental and somatic dysfunction of cervical region: Secondary | ICD-10-CM | POA: Diagnosis not present

## 2021-04-04 DIAGNOSIS — M1712 Unilateral primary osteoarthritis, left knee: Secondary | ICD-10-CM | POA: Diagnosis not present

## 2021-04-10 DIAGNOSIS — M9903 Segmental and somatic dysfunction of lumbar region: Secondary | ICD-10-CM | POA: Diagnosis not present

## 2021-04-10 DIAGNOSIS — M9901 Segmental and somatic dysfunction of cervical region: Secondary | ICD-10-CM | POA: Diagnosis not present

## 2021-04-10 DIAGNOSIS — M62830 Muscle spasm of back: Secondary | ICD-10-CM | POA: Diagnosis not present

## 2021-04-10 DIAGNOSIS — M9904 Segmental and somatic dysfunction of sacral region: Secondary | ICD-10-CM | POA: Diagnosis not present

## 2021-04-11 DIAGNOSIS — M1712 Unilateral primary osteoarthritis, left knee: Secondary | ICD-10-CM | POA: Diagnosis not present

## 2021-04-17 DIAGNOSIS — M62830 Muscle spasm of back: Secondary | ICD-10-CM | POA: Diagnosis not present

## 2021-04-17 DIAGNOSIS — M9901 Segmental and somatic dysfunction of cervical region: Secondary | ICD-10-CM | POA: Diagnosis not present

## 2021-04-17 DIAGNOSIS — M9904 Segmental and somatic dysfunction of sacral region: Secondary | ICD-10-CM | POA: Diagnosis not present

## 2021-04-17 DIAGNOSIS — M9903 Segmental and somatic dysfunction of lumbar region: Secondary | ICD-10-CM | POA: Diagnosis not present

## 2021-04-18 DIAGNOSIS — M1712 Unilateral primary osteoarthritis, left knee: Secondary | ICD-10-CM | POA: Diagnosis not present

## 2021-04-30 DIAGNOSIS — M9901 Segmental and somatic dysfunction of cervical region: Secondary | ICD-10-CM | POA: Diagnosis not present

## 2021-04-30 DIAGNOSIS — M9903 Segmental and somatic dysfunction of lumbar region: Secondary | ICD-10-CM | POA: Diagnosis not present

## 2021-04-30 DIAGNOSIS — M62830 Muscle spasm of back: Secondary | ICD-10-CM | POA: Diagnosis not present

## 2021-04-30 DIAGNOSIS — M9904 Segmental and somatic dysfunction of sacral region: Secondary | ICD-10-CM | POA: Diagnosis not present

## 2021-05-04 ENCOUNTER — Other Ambulatory Visit: Payer: Self-pay | Admitting: Internal Medicine

## 2021-05-08 DIAGNOSIS — M9903 Segmental and somatic dysfunction of lumbar region: Secondary | ICD-10-CM | POA: Diagnosis not present

## 2021-05-08 DIAGNOSIS — M9901 Segmental and somatic dysfunction of cervical region: Secondary | ICD-10-CM | POA: Diagnosis not present

## 2021-05-08 DIAGNOSIS — M62830 Muscle spasm of back: Secondary | ICD-10-CM | POA: Diagnosis not present

## 2021-05-08 DIAGNOSIS — M9904 Segmental and somatic dysfunction of sacral region: Secondary | ICD-10-CM | POA: Diagnosis not present

## 2021-05-15 DIAGNOSIS — M9903 Segmental and somatic dysfunction of lumbar region: Secondary | ICD-10-CM | POA: Diagnosis not present

## 2021-05-15 DIAGNOSIS — M62830 Muscle spasm of back: Secondary | ICD-10-CM | POA: Diagnosis not present

## 2021-05-15 DIAGNOSIS — M9904 Segmental and somatic dysfunction of sacral region: Secondary | ICD-10-CM | POA: Diagnosis not present

## 2021-05-15 DIAGNOSIS — M9901 Segmental and somatic dysfunction of cervical region: Secondary | ICD-10-CM | POA: Diagnosis not present

## 2021-05-22 DIAGNOSIS — M9901 Segmental and somatic dysfunction of cervical region: Secondary | ICD-10-CM | POA: Diagnosis not present

## 2021-05-22 DIAGNOSIS — M62830 Muscle spasm of back: Secondary | ICD-10-CM | POA: Diagnosis not present

## 2021-05-22 DIAGNOSIS — M9904 Segmental and somatic dysfunction of sacral region: Secondary | ICD-10-CM | POA: Diagnosis not present

## 2021-05-22 DIAGNOSIS — M9903 Segmental and somatic dysfunction of lumbar region: Secondary | ICD-10-CM | POA: Diagnosis not present

## 2021-06-03 ENCOUNTER — Ambulatory Visit (INDEPENDENT_AMBULATORY_CARE_PROVIDER_SITE_OTHER): Payer: Medicare Other

## 2021-06-03 VITALS — Ht 60.0 in | Wt 171.0 lb

## 2021-06-03 DIAGNOSIS — Z0000 Encounter for general adult medical examination without abnormal findings: Secondary | ICD-10-CM | POA: Diagnosis not present

## 2021-06-03 NOTE — Patient Instructions (Signed)
Misty Smith , Thank you for taking time to come for your Medicare Wellness Visit. I appreciate your ongoing commitment to your health goals. Please review the following plan we discussed and let me know if I can assist you in the future.   Screening recommendations/referrals: Colonoscopy: Done 01/08/2017 - no repeat required Mammogram: Done 03/11/2021 - Repeat annually  Bone Density: Done 11/30/2020 - Repeat every 2 years Recommended yearly ophthalmology/optometry visit for glaucoma screening and checkup Recommended yearly dental visit for hygiene and checkup  Vaccinations: Influenza vaccine: Done 06/19/2020 - Repeat annually Pneumococcal vaccine: Done 09/21/2013 & 06/23/2019 Tdap vaccine: Done 06/02/2011 - Repeat in 10 years Shingles vaccine: Zostavax done 2008 - Shingrix discussed. Please contact your pharmacy for coverage information.     Covid-19: Done 11/08/2019, 11/29/2019, 07/17/2020 & 04/30/2021  Advanced directives: Please bring a copy of your health care power of attorney and living will to the office to be added to your chart at your convenience.   Conditions/risks identified: Aim for 30 minutes of exercise and/or walking each day, drink 6-8 glasses of water and eat lots of fruits and vegetables.   Next appointment: Follow up in one year for your annual wellness visit    Preventive Care 65 Years and Older, Female Preventive care refers to lifestyle choices and visits with your health care provider that can promote health and wellness. What does preventive care include? A yearly physical exam. This is also called an annual well check. Dental exams once or twice a year. Routine eye exams. Ask your health care provider how often you should have your eyes checked. Personal lifestyle choices, including: Daily care of your teeth and gums. Regular physical activity. Eating a healthy diet. Avoiding tobacco and drug use. Limiting alcohol use. Practicing safe sex. Taking low-dose aspirin  every day. Taking vitamin and mineral supplements as recommended by your health care provider. What happens during an annual well check? The services and screenings done by your health care provider during your annual well check will depend on your age, overall health, lifestyle risk factors, and family history of disease. Counseling  Your health care provider may ask you questions about your: Alcohol use. Tobacco use. Drug use. Emotional well-being. Home and relationship well-being. Sexual activity. Eating habits. History of falls. Memory and ability to understand (cognition). Work and work Statistician. Reproductive health. Screening  You may have the following tests or measurements: Height, weight, and BMI. Blood pressure. Lipid and cholesterol levels. These may be checked every 5 years, or more frequently if you are over 15 years old. Skin check. Lung cancer screening. You may have this screening every year starting at age 63 if you have a 30-pack-year history of smoking and currently smoke or have quit within the past 15 years. Fecal occult blood test (FOBT) of the stool. You may have this test every year starting at age 44. Flexible sigmoidoscopy or colonoscopy. You may have a sigmoidoscopy every 5 years or a colonoscopy every 10 years starting at age 31. Hepatitis C blood test. Hepatitis B blood test. Sexually transmitted disease (STD) testing. Diabetes screening. This is done by checking your blood sugar (glucose) after you have not eaten for a while (fasting). You may have this done every 1-3 years. Bone density scan. This is done to screen for osteoporosis. You may have this done starting at age 79. Mammogram. This may be done every 1-2 years. Talk to your health care provider about how often you should have regular mammograms. Talk with your  health care provider about your test results, treatment options, and if necessary, the need for more tests. Vaccines  Your health  care provider may recommend certain vaccines, such as: Influenza vaccine. This is recommended every year. Tetanus, diphtheria, and acellular pertussis (Tdap, Td) vaccine. You may need a Td booster every 10 years. Zoster vaccine. You may need this after age 20. Pneumococcal 13-valent conjugate (PCV13) vaccine. One dose is recommended after age 53. Pneumococcal polysaccharide (PPSV23) vaccine. One dose is recommended after age 15. Talk to your health care provider about which screenings and vaccines you need and how often you need them. This information is not intended to replace advice given to you by your health care provider. Make sure you discuss any questions you have with your health care provider. Document Released: 09/14/2015 Document Revised: 05/07/2016 Document Reviewed: 06/19/2015 Elsevier Interactive Patient Education  2017 [place] Prevention in the Home Falls can cause injuries. They can happen to people of all ages. There are many things you can do to make your home safe and to help prevent falls. What can I do on the outside of my home? Regularly fix the edges of walkways and driveways and fix any cracks. Remove anything that might make you trip as you walk through a door, such as a raised step or threshold. Trim any bushes or trees on the path to your home. Use bright outdoor lighting. Clear any walking paths of anything that might make someone trip, such as rocks or tools. Regularly check to see if handrails are loose or broken. Make sure that both sides of any steps have handrails. Any raised decks and porches should have guardrails on the edges. Have any leaves, snow, or ice cleared regularly. Use sand or salt on walking paths during winter. Clean up any spills in your garage right away. This includes oil or grease spills. What can I do in the bathroom? Use night lights. Install grab bars by the toilet and in the tub and shower. Do not use towel bars as grab  bars. Use non-skid mats or decals in the tub or shower. If you need to sit down in the shower, use a plastic, non-slip stool. Keep the floor dry. Clean up any water that spills on the floor as soon as it happens. Remove soap buildup in the tub or shower regularly. Attach bath mats securely with double-sided non-slip rug tape. Do not have throw rugs and other things on the floor that can make you trip. What can I do in the bedroom? Use night lights. Make sure that you have a light by your bed that is easy to reach. Do not use any sheets or blankets that are too big for your bed. They should not hang down onto the floor. Have a firm chair that has side arms. You can use this for support while you get dressed. Do not have throw rugs and other things on the floor that can make you trip. What can I do in the kitchen? Clean up any spills right away. Avoid walking on wet floors. Keep items that you use a lot in easy-to-reach places. If you need to reach something above you, use a strong step stool that has a grab bar. Keep electrical cords out of the way. Do not use floor polish or wax that makes floors slippery. If you must use wax, use non-skid floor wax. Do not have throw rugs and other things on the floor that can make you trip. What  can I do with my stairs? Do not leave any items on the stairs. Make sure that there are handrails on both sides of the stairs and use them. Fix handrails that are broken or loose. Make sure that handrails are as long as the stairways. Check any carpeting to make sure that it is firmly attached to the stairs. Fix any carpet that is loose or worn. Avoid having throw rugs at the top or bottom of the stairs. If you do have throw rugs, attach them to the floor with carpet tape. Make sure that you have a light switch at the top of the stairs and the bottom of the stairs. If you do not have them, ask someone to add them for you. What else can I do to help prevent  falls? Wear shoes that: Do not have high heels. Have rubber bottoms. Are comfortable and fit you well. Are closed at the toe. Do not wear sandals. If you use a stepladder: Make sure that it is fully opened. Do not climb a closed stepladder. Make sure that both sides of the stepladder are locked into place. Ask someone to hold it for you, if possible. Clearly mark and make sure that you can see: Any grab bars or handrails. First and last steps. Where the edge of each step is. Use tools that help you move around (mobility aids) if they are needed. These include: Canes. Walkers. Scooters. Crutches. Turn on the lights when you go into a dark area. Replace any light bulbs as soon as they burn out. Set up your furniture so you have a clear path. Avoid moving your furniture around. If any of your floors are uneven, fix them. If there are any pets around you, be aware of where they are. Review your medicines with your doctor. Some medicines can make you feel dizzy. This can increase your chance of falling. Ask your doctor what other things that you can do to help prevent falls. This information is not intended to replace advice given to you by your health care provider. Make sure you discuss any questions you have with your health care provider. Document Released: 06/14/2009 Document Revised: 01/24/2016 Document Reviewed: 09/22/2014 Elsevier Interactive Patient Education  2017 [employer].

## 2021-06-03 NOTE — Progress Notes (Signed)
Subjective:   Misty Smith is a 80 y.o. female who presents for Medicare Annual (Subsequent) preventive examination.  Virtual Visit via Telephone Note  I connected with  Misty Smith on 06/03/21 at  8:15 AM EDT by telephone and verified that I am speaking with the correct person using two identifiers.  Location: Patient: Home Provider: [facility] Persons participating in the virtual visit: patient/Nurse Health Advisor   I discussed the limitations, risks, security and privacy concerns of performing an evaluation and management service by telephone and the availability of in person appointments. The patient expressed understanding and agreed to proceed.  Interactive audio and video telecommunications were attempted between this nurse and patient, however failed, due to patient having technical difficulties OR patient did not have access to video capability.  We continued and completed visit with audio only.  Some vital signs may be absent or patient reported.   Tarica Harl E Brevin Mcfadden, LPN   Review of Systems     Cardiac Risk Factors include: advanced age (>64men, >49 women);diabetes mellitus;dyslipidemia;hypertension;obesity (BMI >30kg/m2);sedentary lifestyle;Other (see comment), Risk factor comments: CAD     Objective:    Today's Vitals   06/03/21 0823 06/03/21 0824  Weight: 171 lb (77.6 kg)   Height: 5' (1.524 m)   PainSc:  3    Body mass index is 33.4 kg/m.  Advanced Directives 06/03/2021 06/29/2018 04/16/2018 04/29/2017 04/22/2016 03/16/2014 11/24/2013  Does Patient Have a Medical Advance Directive? Yes Yes No Yes Yes Patient has advance directive, copy in chart Patient has advance directive, copy not in chart  Type of Advance Directive [place];Living will [place];Living will - [place];Living will [place];Living will Living will;Healthcare Power of Attorney Living will;Healthcare Power of Attorney  Does  patient want to make changes to medical advance directive? - No - Patient declined - No - Patient declined No - Patient declined - -  Copy of [place] in Chart? No - copy requested No - copy requested - No - copy requested No - copy requested - Copy requested from family  Would patient like information on creating a medical advance directive? - - No - Patient declined - - - -  Pre-existing out of facility DNR order (yellow form or pink MOST form) - - - - - No -    Current Medications (verified) Outpatient Encounter Medications as of 06/03/2021  Medication Sig   amLODipine (NORVASC) 5 MG tablet Take 5 mg by mouth daily.   aspirin EC 81 MG tablet Take 1 tablet (81 mg total) by mouth daily.   atorvastatin (LIPITOR) 80 MG tablet Take 1 tablet (80 mg total) by mouth at bedtime.   betamethasone dipropionate 0.05 % cream Apply topically.   Calcium Carb-Cholecalciferol 600-800 MG-UNIT TABS Take 1 tablet by mouth every evening.    cholecalciferol (VITAMIN D) 1000 UNITS tablet Take 2,000 Units by mouth daily.   CLINPRO 5000 1.1 % PSTE Place onto teeth 2 (two) times daily.   escitalopram (LEXAPRO) 20 MG tablet Take 1 tablet (20 mg total) by mouth daily.   esomeprazole (NEXIUM) 40 MG capsule Take 1 capsule (40 mg total) by mouth daily.   estradiol (ESTRACE) 0.1 MG/GM vaginal cream Place vaginally.   hydrochlorothiazide (HYDRODIURIL) 12.5 MG tablet Take 1 tablet (12.5 mg total) by mouth daily.   hydrocortisone 2.5 % cream PLEASE SEE ATTACHED FOR DETAILED DIRECTIONS   irbesartan (AVAPRO) 150 MG tablet TAKE 1 TABLET BY MOUTH  EVERY DAY   ketoconazole (NIZORAL) 2 % cream Apply topically 2 (two) times daily as needed.   metoprolol tartrate (LOPRESSOR) 25 MG tablet Take 0.5 tablets (12.5 mg total) by mouth 2 (two) times daily.   Multiple Vitamin (MULTIVITAMIN) tablet Take 1 tablet by mouth every morning.    omega-3 acid ethyl esters (LOVAZA) 1 g capsule Take 2 capsules (2 g total) by mouth 2  (two) times daily.   pioglitazone (ACTOS) 30 MG tablet Take 1 tablet (30 mg total) by mouth daily.   [DISCONTINUED] amLODipine (NORVASC) 10 MG tablet Take 1 tablet (10 mg total) by mouth daily.   [DISCONTINUED] clindamycin (CLEOCIN) 150 MG capsule Take 2 capsules by mouth in the morning, at noon, and at bedtime.   No facility-administered encounter medications on file as of 06/03/2021.    Allergies (verified) Ciprofloxacin, Codeine, Iohexol, Penicillins, Sulfonamide derivatives, Asa [aspirin], Keflex [cephalexin], and Morphine and related   History: Past Medical History:  Diagnosis Date   Allergy    Anxiety    Arthritis    Bronchitis    Cataract    Colovaginal fistula    Depression    Diabetes mellitus without complication (HCC)    Dysrhythmia    palpitations, occasional PVCs; Dr Stanford Breed cardiologist   Esophageal stricture    Fractured elbow 1970's   GERD (gastroesophageal reflux disease)    H/O hiatal hernia    Hyperlipidemia    Hypertension    Osteopenia    Perimenopausal vasomotor symptoms    Postmenopausal HRT (hormone replacement therapy)    Pre-diabetes    Stress    Subacute lupus erythematosus    Dr. Allyson Sabal   Past Surgical History:  Procedure Laterality Date   abdominal bypass  1987   fibroids    ABDOMINAL HYSTERECTOMY     partial   bilateral tubal ligtation  1972   CHOLECYSTECTOMY N/A 03/21/2014   Procedure: LAPAROSCOPIC CHOLECYSTECTOMY WITH INTRAOPERATIVE CHOLANGIOGRAM;  Surgeon: Shann Medal, MD;  Location: [facility] [place];  Service: General;  Laterality: N/A;   COLONOSCOPY     elbow Right 1976   surgery after fracture of elbow   EYE SURGERY Bilateral    cataracts   ROTATOR CUFF REPAIR Right 03/29/2018   TONSILLECTOMY     TUBAL LIGATION     Family History  Problem Relation Age of Onset   Cancer Mother        laryngeal    Hypertension Mother    Heart disease Mother    Hypertension Father    Coronary artery disease Father    Kidney disease Father         kidney failure    Hypertension Sister    Cancer Sister        breast   Heart attack Sister    Heart disease Sister    Stroke Sister    Stroke Sister    Hypertension Brother    Cancer Brother        liver   Hypertension Sister    Hyperlipidemia Sister    Hypertension Brother    Heart attack Brother    Hypertension Son    Hypertension Daughter    Social History   Socioeconomic History   Marital status: Widowed    Spouse name: Not on file   Number of children: 4   Years of education: Not on file   Highest education level: Not on file  Occupational History   Occupation: retired     Comment: [employer]  Tobacco Use   Smoking status: Never   Smokeless tobacco: Never  Vaping Use   Vaping Use: Never used  Substance and Sexual Activity   Alcohol use: No    Alcohol/week: 0.0 standard drinks   Drug use: No   Sexual activity: Never  Other Topics Concern   Not on file  Social History Narrative   Lives alone - son next door    Daughters in [place], one in [place], one in [place]   Social Determinants of Health   Financial Resource Strain: Low Risk    Difficulty of Paying Living Expenses: Not hard at all  Food Insecurity: No Food Insecurity   Worried About Charity fundraiser in the Last Year: Never true   Arboriculturist in the Last Year: Never true  Transportation Needs: No Transportation Needs   Lack of Transportation (Medical): No   Lack of Transportation (Non-Medical): No  Physical Activity: Inactive   Days of Exercise per Week: 0 days   Minutes of Exercise per Session: 0 min  Stress: No Stress Concern Present   Feeling of Stress : Only a little  Social Connections: Moderately Integrated   Frequency of Communication with Friends and Family: More than three times a week   Frequency of Social Gatherings with Friends and Family: More than three times a week   Attends Religious Services: More than 4 times per year   Active Member of [employer] or  Organizations: Yes   Attends Archivist Meetings: 1 to 4 times per year   Marital Status: Widowed    Tobacco Counseling Counseling given: Not Answered   Clinical Intake:  Pre-visit preparation completed: Yes  Pain : 0-10 Pain Score: 3  Pain Type: Chronic pain Pain Location: Back Pain Descriptors / Indicators: Aching, Discomfort Pain Onset: More than a month ago Pain Frequency: Intermittent     BMI - recorded: 33.4 Nutritional Status: BMI > 30  Obese Nutritional Risks: None Diabetes: Yes CBG done?: No Did pt. bring in CBG monitor from home?: No  How often do you need to have someone help you when you read instructions, pamphlets, or other written materials from your doctor or pharmacy?: 1 - Never  Diabetic? Nutrition Risk Assessment:  Has the patient had any N/V/D within the last 2 months?  No  Does the patient have any non-healing wounds?   Yes - seeing dermatology Has the patient had any unintentional weight loss or weight gain?  No   Diabetes:  Is the patient diabetic?  Yes  If diabetic, was a CBG obtained today?  No  Did the patient bring in their glucometer from home?  No  How often do you monitor your CBG's? Not at home.   Financial Strains and Diabetes Management:  Are you having any financial strains with the device, your supplies or your medication? No .  Does the patient want to be seen by Chronic Care Management for management of their diabetes?  No  Would the patient like to be referred to a Nutritionist or for Diabetic Management?  No   Diabetic Exams:  Diabetic Eye Exam: Completed 01/30/2021.   Diabetic Foot Exam: Completed 04/02/2020. Pt has been advised about the importance in completing this exam. Pt is scheduled for diabetic foot exam on 08/01/2021.    Interpreter Needed?: No  Information entered by :: Eljay Lave, LPN   Activities of Daily Living In your present state of health, do you have any difficulty performing the following  activities: 06/03/2021  Hearing? N  Vision? N  Difficulty concentrating or making decisions? N  Walking or climbing stairs? Y  Dressing or bathing? N  Doing errands, shopping? N  Preparing Food and eating ? N  Using the Toilet? N  In the past six months, have you accidently leaked urine? Y  Comment wears pads for protection  Do you have problems with loss of bowel control? N  Managing your Medications? N  Managing your Finances? N  Housekeeping or managing your Housekeeping? N  Some recent data might be hidden    Patient Care Team: Dettinger, Fransisca Kaufmann, MD as PCP - General (Family Medicine) Stanford Breed Denice Bors, MD as PCP - Cardiology (Cardiology) Clarene Essex, MD (Gastroenterology) Lelon Perla, MD (Cardiology) Irine Seal, MD (Urology) Paralee Cancel, MD (Orthopedic Surgery) Clent Jacks, MD as Consulting Physician (Ophthalmology) Netta Cedars, MD as Consulting Physician (Orthopedic Surgery)  Indicate any recent Medical Services you may have received from other than [facility] providers in the past year (date may be approximate).     Assessment:   This is a routine wellness examination for [place].  Hearing/Vision screen Hearing Screening - Comments:: Denies hearing difficulties  Vision Screening - Comments:: Wears reading glasses prn only - up to date with annual eye exams with Dr Katy Fitch [facility]  Dietary issues and exercise activities discussed: Current Exercise Habits: The patient does not participate in regular exercise at present, Exercise limited by: orthopedic condition(s);cardiac condition(s);respiratory conditions(s)   Goals Addressed             This Visit's Progress    Exercise 3x per week (30 min per time)   Not on track    Do chair exercises at home daily. Check into exercise programs at the recreation department or [facility].      Prevent falls   On track      Depression Screen PHQ 2/9 Scores 06/03/2021 04/01/2021 03/14/2021 01/25/2021 12/24/2020 11/30/2020  10/09/2020  PHQ - 2 Score 0 0 0 0 0 0 0  PHQ- 9 Score - 0 0 0 - - -    Fall Risk Fall Risk  06/03/2021 04/01/2021 03/14/2021 01/25/2021 12/24/2020  Falls in the past year? 1 1 1 1 1   Number falls in past yr: 0 0 0 0 1  Comment - - - - -  Injury with Fall? 1 1 1 1 1   Comment - - - - -  Risk for fall due to : History of fall(s);Impaired balance/gait;Orthopedic patient Impaired balance/gait;History of fall(s);Impaired mobility History of fall(s);Impaired balance/gait;Impaired mobility History of fall(s) Impaired balance/gait  Follow up Education provided;Falls prevention discussed Falls evaluation completed Falls evaluation completed Education provided Falls evaluation completed    FALL RISK PREVENTION PERTAINING TO THE HOME:  Any stairs in or around the home? Yes  If so, are there any without handrails? No  Home free of loose throw rugs in walkways, pet beds, electrical cords, etc? Yes  Adequate lighting in your home to reduce risk of falls? Yes   ASSISTIVE DEVICES UTILIZED TO PREVENT FALLS:  Life alert? Yes  Use of a cane, walker or w/c? Yes  Grab bars in the bathroom? Yes  Shower chair or bench in shower? Yes  Elevated toilet seat or a handicapped toilet? Yes   TIMED UP AND GO:  Was the test performed? No . Telephonic visit  Cognitive Function: Normal cognitive status assessed by direct observation by this Nurse Health Advisor. No abnormalities found.   MMSE - Mini Mental  State Exam 06/29/2018 04/29/2017 04/22/2016  Orientation to time 5 5 5   Orientation to Place 5 5 5   Registration 3 3 3   Attention/ Calculation 5 5 5   Recall 1 3 3   Language- name 2 objects 2 2 2   Language- repeat 1 1 1   Language- follow 3 step command 3 3 3   Language- read & follow direction 1 1 1   Write a sentence 1 1 1   Copy design 1 1 1   Total score 28 30 30      6CIT Screen 06/03/2021  What Year? 0 points  What month? 0 points  What time? 0 points  Count back from 20 0 points  Months in reverse 0  points  Repeat phrase 4 points  Total Score 4    Immunizations Immunization History  Administered Date(s) Administered   Fluad Quad(high Dose 65+) 06/21/2019, 06/19/2020   Influenza, High Dose Seasonal PF 06/22/2015, 05/26/2016, 06/04/2017, 06/15/2018   Influenza,inj,Quad PF,6+ Mos 06/08/2013, 06/14/2014   Influenza,inj,quad, With Preservative 06/02/2019   Moderna Sars-Covid-2 Vaccination 11/08/2019, 11/29/2019, 07/17/2020, 04/30/2021   Pneumococcal Conjugate-13 09/21/2013   Pneumococcal Polysaccharide-23 06/23/2019    TDAP status: Due, Education has been provided regarding the importance of this vaccine. Advised may receive this vaccine at local pharmacy or Health Dept. Aware to provide a copy of the vaccination record if obtained from local pharmacy or Health Dept. Verbalized acceptance and understanding.  Flu Vaccine status: Due, Education has been provided regarding the importance of this vaccine. Advised may receive this vaccine at local pharmacy or Health Dept. Aware to provide a copy of the vaccination record if obtained from local pharmacy or Health Dept. Verbalized acceptance and understanding.  Pneumococcal vaccine status: Up to date  Covid-19 vaccine status: Completed vaccines  Qualifies for Shingles Vaccine? Yes   Zostavax completed Yes   Shingrix Completed?: No.    Education has been provided regarding the importance of this vaccine. Patient has been advised to call insurance company to determine out of pocket expense if they have not yet received this vaccine. Advised may also receive vaccine at local pharmacy or Health Dept. Verbalized acceptance and understanding.  Screening Tests Health Maintenance  Topic Date Due   Zoster Vaccines- Shingrix (1 of 2) Never done   INFLUENZA VACCINE  04/01/2021   FOOT EXAM  04/02/2021   TETANUS/TDAP  06/01/2021   HEMOGLOBIN A1C  09/28/2021   OPHTHALMOLOGY EXAM  01/30/2022   MAMMOGRAM  03/11/2022   DEXA SCAN  12/01/2022    COVID-19 Vaccine  Completed   HPV VACCINES  Aged Out   PAP SMEAR-Modifier  Discontinued    Health Maintenance  Health Maintenance Due  Topic Date Due   Zoster Vaccines- Shingrix (1 of 2) Never done   INFLUENZA VACCINE  04/01/2021   FOOT EXAM  04/02/2021   TETANUS/TDAP  06/01/2021    Colorectal cancer screening: No longer required.   Mammogram status: Completed 03/11/2021. Repeat every year  Bone Density status: Completed 11/30/2020. Results reflect: Bone density results: OSTEOPENIA. Repeat every 2 years.  Lung Cancer Screening: (Low Dose CT Chest recommended if Age 29-80 years, 30 pack-year currently smoking OR have quit w/in 15years.) does not qualify.   Additional Screening:  Hepatitis C Screening: does not qualify  Vision Screening: Recommended annual ophthalmology exams for early detection of glaucoma and other disorders of the eye. Is the patient up to date with their annual eye exam?  Yes  Who is the provider or what is the name of the office in  which the patient attends annual eye exams? [facility] If pt is not established with a provider, would they like to be referred to a provider to establish care? No .   Dental Screening: Recommended annual dental exams for proper oral hygiene  Community Resource Referral / Chronic Care Management: CRR required this visit?  No   CCM required this visit?  No      Plan:     I have personally reviewed and noted the following in the patient's chart:   Medical and social history Use of alcohol, tobacco or illicit drugs  Current medications and supplements including opioid prescriptions.  Functional ability and status Nutritional status Physical activity Advanced directives List of other physicians Hospitalizations, surgeries, and ER visits in previous 12 months Vitals Screenings to include cognitive, depression, and falls Referrals and appointments  In addition, I have reviewed and discussed with patient certain  preventive protocols, quality metrics, and best practice recommendations. A written personalized care plan for preventive services as well as general preventive health recommendations were provided to patient.     Sandrea Hammond, LPN   72/01/2034   Nurse Notes: None

## 2021-06-05 DIAGNOSIS — M9903 Segmental and somatic dysfunction of lumbar region: Secondary | ICD-10-CM | POA: Diagnosis not present

## 2021-06-05 DIAGNOSIS — M9901 Segmental and somatic dysfunction of cervical region: Secondary | ICD-10-CM | POA: Diagnosis not present

## 2021-06-05 DIAGNOSIS — M62830 Muscle spasm of back: Secondary | ICD-10-CM | POA: Diagnosis not present

## 2021-06-05 DIAGNOSIS — M9904 Segmental and somatic dysfunction of sacral region: Secondary | ICD-10-CM | POA: Diagnosis not present

## 2021-06-12 DIAGNOSIS — M9903 Segmental and somatic dysfunction of lumbar region: Secondary | ICD-10-CM | POA: Diagnosis not present

## 2021-06-12 DIAGNOSIS — M62830 Muscle spasm of back: Secondary | ICD-10-CM | POA: Diagnosis not present

## 2021-06-12 DIAGNOSIS — M9904 Segmental and somatic dysfunction of sacral region: Secondary | ICD-10-CM | POA: Diagnosis not present

## 2021-06-12 DIAGNOSIS — M9901 Segmental and somatic dysfunction of cervical region: Secondary | ICD-10-CM | POA: Diagnosis not present

## 2021-06-14 ENCOUNTER — Encounter: Payer: Self-pay | Admitting: Family Medicine

## 2021-06-14 ENCOUNTER — Ambulatory Visit (INDEPENDENT_AMBULATORY_CARE_PROVIDER_SITE_OTHER): Payer: Medicare Other | Admitting: Family Medicine

## 2021-06-14 ENCOUNTER — Other Ambulatory Visit: Payer: Self-pay

## 2021-06-14 VITALS — BP 144/64 | HR 53 | Temp 97.3°F | Ht 60.0 in | Wt 175.0 lb

## 2021-06-14 DIAGNOSIS — M5416 Radiculopathy, lumbar region: Secondary | ICD-10-CM | POA: Diagnosis not present

## 2021-06-14 MED ORDER — METHYLPREDNISOLONE ACETATE 40 MG/ML IJ SUSP
60.0000 mg | Freq: Once | INTRAMUSCULAR | Status: AC
  Administered 2021-06-14: 60 mg via INTRAMUSCULAR

## 2021-06-14 NOTE — Progress Notes (Signed)
Subjective:  Patient ID: Misty Smith, female    DOB: 1941/06/20, 80 y.o.   MRN: [id number]  Patient Care Team: Dettinger, Fransisca Kaufmann, MD as PCP - General (Family Medicine) Stanford Breed Denice Bors, MD as PCP - Cardiology (Cardiology) Clarene Essex, MD (Gastroenterology) Stanford Breed Denice Bors, MD (Cardiology) Irine Seal, MD (Urology) Paralee Cancel, MD (Orthopedic Surgery) Clent Jacks, MD as Consulting Physician (Ophthalmology) Netta Cedars, MD as Consulting Physician (Orthopedic Surgery)   Chief Complaint:  Back Pain   HPI: Misty Smith is a 80 y.o. female presenting on 06/14/2021 for Back Pain   Pt presents today for chronic back pain, worsening over last 3 weeks. No injury reported.   Back Pain This is a chronic problem. The current episode started more than 1 year ago. The problem occurs constantly. The problem has been waxing and waning since onset. The pain is present in the lumbar spine and gluteal. The quality of the pain is described as aching, burning and shooting. The pain radiates to the right thigh and left thigh. The pain is at a severity of 5/10. The pain is moderate. The symptoms are aggravated by position, bending, standing and twisting. Associated symptoms include leg pain and paresthesias. Pertinent negatives include no abdominal pain, bladder incontinence, bowel incontinence, chest pain, dysuria, fever, headaches, numbness, paresis, pelvic pain, perianal numbness, tingling, weakness or weight loss. She has tried chiropractic manipulation and ice (topical rubs) for the symptoms. The treatment provided mild relief.   Relevant past medical, surgical, family, and social history reviewed and updated as indicated.  Allergies and medications reviewed and updated. Data reviewed: Chart in Epic.   Past Medical History:  Diagnosis Date  . Allergy   . Anxiety   . Arthritis   . Bronchitis   . Cataract   . Colovaginal fistula   . Depression   . Diabetes mellitus without  complication ([place])   . Dysrhythmia    palpitations, occasional PVCs; Dr Stanford Breed cardiologist  . Esophageal stricture   . Fractured elbow 1970's  . GERD (gastroesophageal reflux disease)   . H/O hiatal hernia   . Hyperlipidemia   . Hypertension   . Osteopenia   . Perimenopausal vasomotor symptoms   . Postmenopausal HRT (hormone replacement therapy)   . Pre-diabetes   . Stress   . Subacute lupus erythematosus    Dr. Allyson Sabal    Past Surgical History:  Procedure Laterality Date  . abdominal bypass  1987   fibroids   . ABDOMINAL HYSTERECTOMY     partial  . bilateral tubal ligtation  1972  . CHOLECYSTECTOMY N/A 03/21/2014   Procedure: LAPAROSCOPIC CHOLECYSTECTOMY WITH INTRAOPERATIVE CHOLANGIOGRAM;  Surgeon: Shann Medal, MD;  Location: [place];  Service: General;  Laterality: N/A;  . COLONOSCOPY    . elbow Right 1976   surgery after fracture of elbow  . EYE SURGERY Bilateral    cataracts  . ROTATOR CUFF REPAIR Right 03/29/2018  . TONSILLECTOMY    . TUBAL LIGATION      Social History   Socioeconomic History  . Marital status: Widowed    Spouse name: Not on file  . Number of children: 4  . Years of education: Not on file  . Highest education level: Not on file  Occupational History  . Occupation: retired     Comment: [employer]   Tobacco Use  . Smoking status: Never  . Smokeless tobacco: Never  Vaping Use  . Vaping Use: Never used  Substance and  Sexual Activity  . Alcohol use: No    Alcohol/week: 0.0 standard drinks  . Drug use: No  . Sexual activity: Never  Other Topics Concern  . Not on file  Social History Narrative   Lives alone - son next door    Daughters in [place], one in [place], one in [place]   Social Determinants of Health   Financial Resource Strain: [place]   . Difficulty of Paying Living Expenses: Not hard at all  Food Insecurity: No Food Insecurity  . Worried About Charity fundraiser in the Last Year: Never true  . Ran Out of  Food in the Last Year: Never true  Transportation Needs: No Transportation Needs  . Lack of Transportation (Medical): No  . Lack of Transportation (Non-Medical): No  Physical Activity: Inactive  . Days of Exercise per Week: 0 days  . Minutes of Exercise per Session: 0 min  Stress: No Stress Concern Present  . Feeling of Stress : Only a little  Social Connections: Moderately Integrated  . Frequency of Communication with Friends and Family: More than three times a week  . Frequency of Social Gatherings with Friends and Family: More than three times a week  . Attends Religious Services: More than 4 times per year  . Active Member of Clubs or Organizations: Yes  . Attends Archivist Meetings: 1 to 4 times per year  . Marital Status: Widowed  Intimate Partner Violence: Not At Risk  . Fear of Current or Ex-Partner: No  . Emotionally Abused: No  . Physically Abused: No  . Sexually Abused: No    Outpatient Encounter Medications as of 06/14/2021  Medication Sig  . amLODipine (NORVASC) 5 MG tablet Take 5 mg by mouth daily.  Marland Kitchen aspirin EC 81 MG tablet Take 1 tablet (81 mg total) by mouth daily.  Marland Kitchen atorvastatin (LIPITOR) 80 MG tablet Take 1 tablet (80 mg total) by mouth at bedtime.  . betamethasone dipropionate 0.05 % cream Apply topically.  . Calcium Carb-Cholecalciferol 600-800 MG-UNIT TABS Take 1 tablet by mouth every evening.   . cholecalciferol (VITAMIN D) 1000 UNITS tablet Take 2,000 Units by mouth daily.  Marland Kitchen CLINPRO 5000 1.1 % PSTE Place onto teeth 2 (two) times daily.  Marland Kitchen escitalopram (LEXAPRO) 20 MG tablet Take 1 tablet (20 mg total) by mouth daily.  Marland Kitchen esomeprazole (NEXIUM) 40 MG capsule Take 1 capsule (40 mg total) by mouth daily.  Marland Kitchen estradiol (ESTRACE) 0.1 MG/GM vaginal cream Place vaginally.  . hydrochlorothiazide (HYDRODIURIL) 12.5 MG tablet Take 1 tablet (12.5 mg total) by mouth daily.  . hydrocortisone 2.5 % cream PLEASE SEE ATTACHED FOR DETAILED DIRECTIONS  .  irbesartan (AVAPRO) 150 MG tablet TAKE 1 TABLET BY MOUTH EVERY DAY  . ketoconazole (NIZORAL) 2 % cream Apply topically 2 (two) times daily as needed.  . metoprolol tartrate (LOPRESSOR) 25 MG tablet Take 0.5 tablets (12.5 mg total) by mouth 2 (two) times daily.  . Multiple Vitamin (MULTIVITAMIN) tablet Take 1 tablet by mouth every morning.   Marland Kitchen omega-3 acid ethyl esters (LOVAZA) 1 g capsule Take 2 capsules (2 g total) by mouth 2 (two) times daily.  . pioglitazone (ACTOS) 30 MG tablet Take 1 tablet (30 mg total) by mouth daily.   No facility-administered encounter medications on file as of 06/14/2021.    Allergies  Allergen Reactions  . Ciprofloxacin Rash  . Codeine Rash  . Iohexol      Desc: PT HAD HIVES AND WAS GIVEN BENEDRYL  PO BY NURSE AND OBSERVED IN NURSES STATION AND RELEASED WITHOUT AND OTHER COMPLICATIONS SHEET SCANNED UNDER NURSES NOTES///ON 12/01/11 PT HAD ANOTHER CT WITH PREMEDS MINUS THE BENADRYL. SHE HAD A DELAYED REACTION AFTER WITH THROAT TIGHTNESS AND SOB, JB  12/07/12   . Penicillins Hives  . Sulfonamide Derivatives Hives  . Asa [Aspirin] Other (See Comments)    Large amounts causes stomach pain  . Keflex [Cephalexin] Nausea And Vomiting  . Morphine And Related Rash    Review of Systems  Constitutional:  Negative for activity change, appetite change, chills, diaphoresis, fatigue, fever, unexpected weight change and weight loss.  Cardiovascular:  Negative for chest pain.  Gastrointestinal:  Negative for abdominal distention, abdominal pain, anal bleeding, blood in stool, bowel incontinence, constipation, diarrhea, nausea, rectal pain and vomiting.  Genitourinary:  Negative for bladder incontinence, decreased urine volume, difficulty urinating, dysuria, enuresis, flank pain, frequency, hematuria, pelvic pain and urgency.  Musculoskeletal:  Positive for arthralgias, back pain and gait problem. Negative for joint swelling, myalgias, neck pain and neck stiffness.  Neurological:   Positive for paresthesias. Negative for dizziness, tingling, tremors, seizures, syncope, facial asymmetry, speech difficulty, weakness, light-headedness, numbness and headaches.  All other systems reviewed and are negative.      Objective:  BP (!) 144/64   Pulse (!) 53   Temp (!) 97.3 F (36.3 C)   Ht 5' (1.524 m)   Wt 175 lb (79.4 kg)   SpO2 98%   BMI 34.18 kg/m    Wt Readings from Last 3 Encounters:  06/14/21 175 lb (79.4 kg)  06/03/21 171 lb (77.6 kg)  04/01/21 171 lb (77.6 kg)    Physical Exam Vitals and nursing note reviewed.  Constitutional:      General: She is not in acute distress.    Appearance: Normal appearance. She is well-developed and well-groomed. She is obese. She is not ill-appearing, toxic-appearing or diaphoretic.  HENT:     Head: Normocephalic and atraumatic.     Jaw: There is normal jaw occlusion.     Right Ear: Hearing normal.     Left Ear: Hearing normal.     Nose: Nose normal.     Mouth/Throat:     Lips: Pink.     Mouth: Mucous membranes are moist.     Pharynx: Oropharynx is clear. Uvula midline.  Eyes:     General: Lids are normal.     Extraocular Movements: Extraocular movements intact.     Conjunctiva/sclera: Conjunctivae normal.     Pupils: Pupils are equal, round, and reactive to light.  Neck:     Thyroid: No thyroid mass, thyromegaly or thyroid tenderness.     Vascular: No carotid bruit or JVD.     Trachea: Trachea and phonation normal.  Cardiovascular:     Rate and Rhythm: Normal rate and regular rhythm.     Chest Wall: PMI is not displaced.     Pulses: Normal pulses.     Heart sounds: Normal heart sounds. No murmur heard.   No friction rub. No gallop.  Pulmonary:     Effort: Pulmonary effort is normal. No respiratory distress.     Breath sounds: Normal breath sounds. No wheezing.  Abdominal:     General: Bowel sounds are normal. There is no distension or abdominal bruit.     Palpations: Abdomen is soft. There is no hepatomegaly  or splenomegaly.     Tenderness: There is no abdominal tenderness. There is no right CVA tenderness or left CVA tenderness.  Hernia: No hernia is present.  Musculoskeletal:     Cervical back: Normal, normal range of motion and neck supple.     Thoracic back: Normal.     Lumbar back: Tenderness present. No swelling, edema, deformity, signs of trauma, lacerations, spasms or bony tenderness. Decreased range of motion. Positive right straight leg raise test and positive left straight leg raise test. No scoliosis.     Right hip: Normal.     Left hip: Normal.     Right lower leg: No edema.     Left lower leg: No edema.  Lymphadenopathy:     Cervical: No cervical adenopathy.  Skin:    General: Skin is warm and dry.     Capillary Refill: Capillary refill takes less than 2 seconds.     Coloration: Skin is not cyanotic, jaundiced or pale.     Findings: No rash.  Neurological:     General: No focal deficit present.     Mental Status: She is alert and oriented to person, place, and time.     Cranial Nerves: Cranial nerves are intact.     Sensory: Sensation is intact.     Motor: Motor function is intact.     Coordination: Coordination is intact.     Gait: Gait abnormal (antalgic, uses cane).     Deep Tendon Reflexes: Reflexes are normal and symmetric.  Psychiatric:        Attention and Perception: Attention and perception normal.        Mood and Affect: Mood and affect normal.        Speech: Speech normal.        Behavior: Behavior normal. Behavior is cooperative.        Thought Content: Thought content normal.        Cognition and Memory: Cognition and memory normal.        Judgment: Judgment normal.    Results for orders placed or performed in visit on 03/28/21  Bayer DCA Hb A1c Waived  Result Value Ref Range   HB A1C (BAYER DCA - WAIVED) 6.3 <7.0 %  TSH  Result Value Ref Range   TSH 1.170 0.450 - 4.500 uIU/mL  Lipid panel  Result Value Ref Range   Cholesterol, Total 129 100 -  199 mg/dL   Triglycerides 135 0 - 149 mg/dL   HDL 51 >39 mg/dL   VLDL Cholesterol Cal 23 5 - 40 mg/dL   LDL Chol Calc (NIH) 55 0 - 99 mg/dL   Chol/HDL Ratio 2.5 0.0 - 4.4 ratio  CMP14+EGFR  Result Value Ref Range   Glucose 107 (H) 65 - 99 mg/dL   BUN 15 8 - 27 mg/dL   Creatinine, Ser 0.65 0.57 - 1.00 mg/dL   eGFR 88 >59 mL/min/1.73   BUN/Creatinine Ratio 23 12 - 28   Sodium 133 (L) 134 - 144 mmol/L   Potassium 4.8 3.5 - 5.2 mmol/L   Chloride 94 (L) 96 - 106 mmol/L   CO2 22 20 - 29 mmol/L   Calcium 9.9 8.7 - 10.3 mg/dL   Total Protein 6.5 6.0 - 8.5 g/dL   Albumin 3.9 3.6 - 4.6 g/dL   Globulin, Total 2.6 1.5 - 4.5 g/dL   Albumin/Globulin Ratio 1.5 1.2 - 2.2   Bilirubin Total 0.4 0.0 - 1.2 mg/dL   Alkaline Phosphatase 74 44 - 121 IU/L   AST 22 0 - 40 IU/L   ALT 14 0 - 32 IU/L  CBC with Differential/Platelet  Result  Value Ref Range   WBC 6.6 3.4 - 10.8 x10E3/uL   RBC 3.60 (L) 3.77 - 5.28 x10E6/uL   Hemoglobin 11.3 11.1 - 15.9 g/dL   Hematocrit 33.8 (L) 34.0 - 46.6 %   MCV 94 79 - 97 fL   MCH 31.4 26.6 - 33.0 pg   MCHC 33.4 31.5 - 35.7 g/dL   RDW 12.5 11.7 - 15.4 %   Platelets 274 150 - 450 x10E3/uL   Neutrophils 63 Not Estab. %   Lymphs 22 Not Estab. %   Monocytes 12 Not Estab. %   Eos 2 Not Estab. %   Basos 1 Not Estab. %   Neutrophils Absolute 4.2 1.4 - 7.0 x10E3/uL   Lymphocytes Absolute 1.4 0.7 - 3.1 x10E3/uL   Monocytes Absolute 0.8 0.1 - 0.9 x10E3/uL   EOS (ABSOLUTE) 0.1 0.0 - 0.4 x10E3/uL   Basophils Absolute 0.0 0.0 - 0.2 x10E3/uL   Immature Granulocytes 0 Not Estab. %   Immature Grans (Abs) 0.0 0.0 - 0.1 x10E3/uL       Pertinent labs & imaging results that were available during my care of the patient were reviewed by me and considered in my medical decision making.  Assessment & Plan:  Misty Smith was seen today for back pain.  Diagnoses and all orders for this visit:  Lumbar back pain with radiculopathy affecting lower extremity Bilateral lower back pain  with radiation to bilateral lower extremities.  No red flags concerning for cauda equina syndrome.  No reported urinary symptoms.  No new injuries.  Does follow with orthopedics, aware to make appointment for reevaluation.  Referral to physical therapy placed today.  Steroid injection given in office.  Symptomatic care discussed in detail.  Patient aware of symptoms which require emergent evaluation.  Patient aware to report any new, worsening, or persistent symptoms.  Follow-up in 4 to 6 weeks for reevaluation. -     Ambulatory referral to Physical Therapy     Continue all other maintenance medications.  Follow up plan: Return in about 4 weeks (around 07/12/2021), or if symptoms worsen or fail to improve.   Continue healthy lifestyle choices, including diet (rich in fruits, vegetables, and lean proteins, and low in salt and simple carbohydrates) and exercise (at least 30 minutes of moderate physical activity daily).  Educational handout given for chronic back pain  The above assessment and management plan was discussed with the patient. The patient verbalized understanding of and has agreed to the management plan. Patient is aware to call the clinic if they develop any new symptoms or if symptoms persist or worsen. Patient is aware when to return to the clinic for a follow-up visit. Patient educated on when it is appropriate to go to the emergency department.   Monia Pouch, FNP-C [facility] [phone]

## 2021-06-18 ENCOUNTER — Other Ambulatory Visit: Payer: Self-pay

## 2021-06-18 ENCOUNTER — Ambulatory Visit: Payer: Medicare Other | Attending: Family Medicine | Admitting: Physical Therapy

## 2021-06-18 ENCOUNTER — Encounter: Payer: Self-pay | Admitting: Physical Therapy

## 2021-06-18 DIAGNOSIS — M5441 Lumbago with sciatica, right side: Secondary | ICD-10-CM | POA: Diagnosis not present

## 2021-06-18 DIAGNOSIS — G8929 Other chronic pain: Secondary | ICD-10-CM | POA: Diagnosis not present

## 2021-06-18 DIAGNOSIS — M5442 Lumbago with sciatica, left side: Secondary | ICD-10-CM | POA: Insufficient documentation

## 2021-06-18 DIAGNOSIS — R293 Abnormal posture: Secondary | ICD-10-CM

## 2021-06-18 DIAGNOSIS — M5416 Radiculopathy, lumbar region: Secondary | ICD-10-CM | POA: Diagnosis not present

## 2021-06-18 NOTE — Therapy (Signed)
[facility] [place], [place], [address] Phone: [phone]   Fax:  [phone]  Physical Therapy Evaluation  Patient Details  Name: Misty Smith MRN: [id number] Date of Birth: Apr 04, 1941 Referring Provider (PT): Darla Lesches   Encounter Date: 06/18/2021   PT End of Session - 06/18/21 1542     Visit Number 1    Number of Visits 12    Date for PT Re-Evaluation 09/16/21    Authorization Type FOTO AT LEAST EVERY 5TH VISIT.  PROGRESS NOTE AT 10TH VISIT.  KX MODIFIER AFTER 15 VISITS.    PT Start Time 0237    PT Stop Time 0328    PT Time Calculation (min) 51 min    Activity Tolerance Patient tolerated treatment well    Behavior During Therapy WFL for tasks assessed/performed             Past Medical History:  Diagnosis Date   Allergy    Anxiety    Arthritis    Bronchitis    Cataract    Colovaginal fistula    Depression    Diabetes mellitus without complication (HCC)    Dysrhythmia    palpitations, occasional PVCs; Dr Stanford Breed cardiologist   Esophageal stricture    Fractured elbow 1970's   GERD (gastroesophageal reflux disease)    H/O hiatal hernia    Hyperlipidemia    Hypertension    Osteopenia    Perimenopausal vasomotor symptoms    Postmenopausal HRT (hormone replacement therapy)    Pre-diabetes    Stress    Subacute lupus erythematosus    Dr. Allyson Sabal    Past Surgical History:  Procedure Laterality Date   abdominal bypass  1987   fibroids    ABDOMINAL HYSTERECTOMY     partial   bilateral tubal ligtation  1972   CHOLECYSTECTOMY N/A 03/21/2014   Procedure: LAPAROSCOPIC CHOLECYSTECTOMY WITH INTRAOPERATIVE CHOLANGIOGRAM;  Surgeon: Shann Medal, MD;  Location: [place];  Service: General;  Laterality: N/A;   COLONOSCOPY     elbow Right 1976   surgery after fracture of elbow   EYE SURGERY Bilateral    cataracts   ROTATOR CUFF REPAIR Right 03/29/2018   TONSILLECTOMY     TUBAL LIGATION      There  were no vitals filed for this visit.    Subjective Assessment - 06/18/21 1537     Subjective COVID-19 screen performed prior to patient entering clinic.  The patient presents to the clinic today with a reported two year h/o low back pain.  She has received an injection and was receiving Chiropratic care but neither were very helpful.  She c/o bilateral low back pain right > left with some symptoms into her thighs.  Her pain is a low 2/10 today at rest while sitted but states her pain will rise to near severe levels when performing household activities such as vacuuming and also when she stoops.    Pertinent History Left knee arthroscopic surgery, osteopenia, HTN, right RTC repair.    How long can you sit comfortably? Not a problem.    How long can you stand comfortably? Less than 10 minutes.    How long can you walk comfortably? Short community distances with a cane.    Patient Stated Goals Be able to do more with less pain.    Currently in Pain? Yes    Pain Score 2     Pain Location Back    Pain Orientation Right;Left    Pain  Descriptors / Indicators Aching;Sore    Pain Type Chronic pain    Pain Onset More than a month ago    Pain Frequency Constant    Aggravating Factors  See above.    Pain Relieving Factors Rest, sitting, Tylenol.                [facility] PT Assessment - 06/18/21 0001       Assessment   Medical Diagnosis Lumbar back pain with radiculopathy.    Referring Provider (PT) Darla Lesches    Onset Date/Surgical Date --   ~2 years (patient reported).     Precautions   Precautions None      Restrictions   Weight Bearing Restrictions No      Balance Screen   Has the patient fallen in the past 6 months Yes    How many times? 1   "Tripped."   Has the patient had a decrease in activity level because of a fear of falling?  No    Is the patient reluctant to leave their home because of a fear of falling?  No      Home Environment   Living Environment Private residence       Prior Function   Level of Independence Independent      Posture/Postural Control   Posture/Postural Control Postural limitations    Postural Limitations Rounded Shoulders;Forward head;Decreased lumbar lordosis;Decreased thoracic kyphosis;Flexed trunk      Deep Tendon Reflexes   DTR Assessment Site Patella;Achilles    Patella DTR 2+    Achilles DTR 2+      ROM / Strength   AROM / PROM / Strength AROM;Strength      AROM   Overall AROM Comments Active lumbar extension to neutral position and flexion decreased by 50%, bilateral active sidebending to 15 degrees and painful.  Passive left hip IR limited to 5 degrees.      Strength   Overall Strength Comments Essentially normal LE strength.      Palpation   Palpation comment Tender to palpation diffusely over patient's bilateral lumbar musculature which is very taut.  She is also very tender to palpation over her SIJ's especially on the right and her upper gluteal musculature.      Special Tests   Other special tests Right LE longer than right 1/2 inch or less than left.  Pain with SLR test.      Bed Mobility   Bed Mobility Supine to Sit    Supine to Sit Contact Guard/Touching assist      Ambulation/Gait   Gait Comments The patient walks in some spinal flexion with a cane.                        Objective measurements completed on examination: See above findings.       [facility] Adult PT Treatment/Exercise - 06/18/21 0001       Modalities   Modalities Electrical Stimulation;Moist Heat      Moist Heat Therapy   Number Minutes Moist Heat 20 Minutes    Moist Heat Location Lumbar Spine      Electrical Stimulation   Electrical Stimulation Location Bilateral lower lumbar region.    Electrical Stimulation Action IFC at 80-150 Hz.    Electrical Stimulation Parameters 40% scan x 20 minutes.    Electrical Stimulation Goals Pain;Tone  PT Long Term Goals - 06/18/21 1646        PT LONG TERM GOAL #1   Title Independent with a HEP.    Time 6    Period Weeks    Status New      PT LONG TERM GOAL #2   Title Stand 15 minutes with pain not > 3-4/10.    Time 6    Period Weeks    Status New      PT LONG TERM GOAL #3   Title Perform houshold activities for 30 minutes without stopping to sit.    Time 6    Period Weeks    Status New      PT LONG TERM GOAL #4   Title Walk a community distance with pain not > 4/10.    Time 6    Period Weeks    Status New                    Plan - 06/18/21 1633     Clinical Impression Statement The patient presents to [facility] with c/o chronic low back pain, right > left and symptoms into bilateral thighs.  The patient patients stands in spinal flexion and is currently only able to extend to the neutral position.  Her lumbar musculature is very taut to palpation.  She is very tender to palpation over her right SIJ.  She has pain with SLR testing.  The patient's functional mobility is impaired and she can only perform households activities in short durations due to pain that can become severe.  She has a leg length discrepancy with her longer than left.  Patient will benefit from skilled physical therapy intervention to address pain and deficits.    Personal Factors and Comorbidities Comorbidity 1;Comorbidity 2;Other    Comorbidities Left knee arthroscopic surgery, osteopenia, HTN, right RTC repair.    Examination-Activity Limitations Locomotion Level;Other;Stand    Examination-Participation Restrictions Other;Meal Prep    Stability/Clinical Decision Making Evolving/Moderate complexity    Clinical Decision Making Low    Rehab Potential Good    PT Frequency 2x / week    PT Duration 6 weeks    PT Treatment/Interventions ADLs/Self Care Home Management;Cryotherapy;Electrical Stimulation;Ultrasound;Moist Heat;Functional mobility training;Therapeutic activities;Therapeutic exercise;Manual techniques;Patient/family  education;Passive range of motion;Dry needling    PT Next Visit Plan Combo e'stim/US, STW/M, right SKTC and gentle right hip stretching, postural exercises, core exercise progression.    Consulted and Agree with Plan of Care Patient             Patient will benefit from skilled therapeutic intervention in order to improve the following deficits and impairments:  Abnormal gait, Pain, Decreased activity tolerance, Decreased mobility, Decreased range of motion, Postural dysfunction, Increased muscle spasms  Visit Diagnosis: Chronic bilateral low back pain with bilateral sciatica - Plan: PT plan of care cert/re-cert  Abnormal posture - Plan: PT plan of care cert/re-cert     Problem List Patient Active Problem List   Diagnosis Date Noted   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   Upper airway cough syndrome 02/08/2021   Diabetes mellitus without complication ([place]) 43/32/9518   Pseudophakia of both eyes 01/30/2021   Lumbar radiculopathy 01/11/2021   Choroidal nevus, 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   Tear of medial meniscus of knee 12/05/2019   Pain in left knee 09/21/2019   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   Tear of right rotator cuff 01/12/2018   Impingement syndrome of shoulder region 09/26/2017   Coronary artery disease due to lipid  rich plaque 03/18/2016   Anxiety state 03/18/2016   Vitamin D deficiency 03/18/2016   Gall bladder disease 03/02/2014   CAD (coronary artery disease) 12/30/2013   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   Osteopenia of the elderly 10/26/2013   Generalized anxiety disorder 09/21/2013   GERD (gastroesophageal reflux disease) 09/21/2013   Multiple pulmonary nodules determined by computed tomography of lung 09/21/2013   SYNCOPE 02/02/2009   Pre-diabetes 02/01/2009   Hyperlipidemia 02/01/2009   Essential hypertension 02/01/2009   DIZZINESS 02/01/2009    Jorje Vanatta, [place], PT 06/18/2021, 4:54 PM  [facility] [address] [place], [place], [address] Phone: [phone]   Fax:  [phone]  Name: Misty Smith MRN: [id number] Date of Birth: Sep 12, 1940

## 2021-06-21 ENCOUNTER — Ambulatory Visit: Payer: Medicare Other

## 2021-06-21 ENCOUNTER — Other Ambulatory Visit: Payer: Self-pay

## 2021-06-21 DIAGNOSIS — G8929 Other chronic pain: Secondary | ICD-10-CM

## 2021-06-21 DIAGNOSIS — M5442 Lumbago with sciatica, left side: Secondary | ICD-10-CM | POA: Diagnosis not present

## 2021-06-21 DIAGNOSIS — R293 Abnormal posture: Secondary | ICD-10-CM | POA: Diagnosis not present

## 2021-06-21 DIAGNOSIS — M5441 Lumbago with sciatica, right side: Secondary | ICD-10-CM

## 2021-06-21 DIAGNOSIS — M5416 Radiculopathy, lumbar region: Secondary | ICD-10-CM | POA: Diagnosis not present

## 2021-06-21 NOTE — Therapy (Signed)
[facility] [place], [place], [address] Phone: [phone]   Fax:  [phone]  Physical Therapy Treatment  Patient Details  Name: Misty Smith MRN: [id number] Date of Birth: 1940/09/24 Referring Provider (PT): Darla Lesches   Encounter Date: 06/21/2021   PT End of Session - 06/21/21 2563     Visit Number 2    Number of Visits 12    Date for PT Re-Evaluation 09/16/21    Authorization Type FOTO AT LEAST EVERY 5TH VISIT.  PROGRESS NOTE AT 10TH VISIT.  KX MODIFIER AFTER 15 VISITS.    PT Start Time 0820    PT Stop Time 0904    PT Time Calculation (min) 44 min    Activity Tolerance Patient tolerated treatment well    Behavior During Therapy WFL for tasks assessed/performed             Past Medical History:  Diagnosis Date   Allergy    Anxiety    Arthritis    Bronchitis    Cataract    Colovaginal fistula    Depression    Diabetes mellitus without complication (HCC)    Dysrhythmia    palpitations, occasional PVCs; Dr Stanford Breed cardiologist   Esophageal stricture    Fractured elbow 1970's   GERD (gastroesophageal reflux disease)    H/O hiatal hernia    Hyperlipidemia    Hypertension    Osteopenia    Perimenopausal vasomotor symptoms    Postmenopausal HRT (hormone replacement therapy)    Pre-diabetes    Stress    Subacute lupus erythematosus    Dr. Allyson Sabal    Past Surgical History:  Procedure Laterality Date   abdominal bypass  1987   fibroids    ABDOMINAL HYSTERECTOMY     partial   bilateral tubal ligtation  1972   CHOLECYSTECTOMY N/A 03/21/2014   Procedure: LAPAROSCOPIC CHOLECYSTECTOMY WITH INTRAOPERATIVE CHOLANGIOGRAM;  Surgeon: Shann Medal, MD;  Location: [place];  Service: General;  Laterality: N/A;   COLONOSCOPY     elbow Right 1976   surgery after fracture of elbow   EYE SURGERY Bilateral    cataracts   ROTATOR CUFF REPAIR Right 03/29/2018   TONSILLECTOMY     TUBAL LIGATION      There  were no vitals filed for this visit.   Subjective Assessment - 06/21/21 0821     Subjective COVID-19 screen performed prior to patient entering clinic.  Patient reports that her back is hurting a little bit today, but it felt a lot better after her last appointment.    Pertinent History Left knee arthroscopic surgery, osteopenia, HTN, right RTC repair.    How long can you sit comfortably? Not a problem.    How long can you stand comfortably? Less than 10 minutes.    How long can you walk comfortably? Short community distances with a cane.    Patient Stated Goals Be able to do more with less pain.    Currently in Pain? Yes    Pain Score 5     Pain Location Back    Pain Orientation Lower    Pain Type Chronic pain    Pain Onset More than a month ago                               [facility] Adult PT Treatment/Exercise - 06/21/21 0001       Exercises   Exercises Lumbar;Knee/Hip  Lumbar Exercises: Aerobic   Nustep L4 x 10 mins      Lumbar Exercises: Seated   Other Seated Lumbar Exercises Ball roll outs   2 minutes   Other Seated Lumbar Exercises Slouch/Overcorrect   2 minutes     Lumbar Exercises: Supine   Clam 20 reps;Other (comment)   seated; yellow t-band     Knee/Hip Exercises: Seated   Marching Both   2 minutes     Manual Therapy   Manual Therapy Joint mobilization;Soft tissue mobilization    Joint Mobilization Grade II-III Lumbar CPA's    Soft tissue mobilization R lumbar paraspinals and gluteals                          PT Long Term Goals - 06/18/21 1646       PT LONG TERM GOAL #1   Title Independent with a HEP.    Time 6    Period Weeks    Status New      PT LONG TERM GOAL #2   Title Stand 15 minutes with pain not > 3-4/10.    Time 6    Period Weeks    Status New      PT LONG TERM GOAL #3   Title Perform houshold activities for 30 minutes without stopping to sit.    Time 6    Period Weeks    Status New      PT LONG  TERM GOAL #4   Title Walk a community distance with pain not > 4/10.    Time 6    Period Weeks    Status New                   Plan - 06/21/21 0849     Clinical Impression Statement Treatment was initiated with manual therapy with soft tissue mobilization to the right gluteals and lumbar paraspinals being the most effective. This was followed by appropriate interventions for improved lumbar mobility and postural reeducation. She required minimal cuing with today's interventions for proper pacing as she attempted to rush through each intervention. She reported feeling fatigued upon the conclusion of treatment. She continues to require skilled physical therapy to address her remaining impairments to return to her prior level of function.    Personal Factors and Comorbidities Comorbidity 1;Comorbidity 2;Other    Comorbidities Left knee arthroscopic surgery, osteopenia, HTN, right RTC repair.    Examination-Activity Limitations Locomotion Level;Other;Stand    Examination-Participation Restrictions Other;Meal Prep    Stability/Clinical Decision Making Evolving/Moderate complexity    Rehab Potential Good    PT Frequency 2x / week    PT Duration 6 weeks    PT Treatment/Interventions ADLs/Self Care Home Management;Cryotherapy;Electrical Stimulation;Ultrasound;Moist Heat;Functional mobility training;Therapeutic activities;Therapeutic exercise;Manual techniques;Patient/family education;Passive range of motion;Dry needling    PT Next Visit Plan Combo e'stim/US, STW/M, right SKTC and gentle right hip stretching, postural exercises, core exercise progression.    Consulted and Agree with Plan of Care Patient             Patient will benefit from skilled therapeutic intervention in order to improve the following deficits and impairments:  Abnormal gait, Pain, Decreased activity tolerance, Decreased mobility, Decreased range of motion, Postural dysfunction, Increased muscle spasms  Visit  Diagnosis: Chronic bilateral low back pain with bilateral sciatica  Abnormal posture     Problem List Patient Active Problem List   Diagnosis Date Noted   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  Upper airway cough syndrome 02/08/2021   Diabetes mellitus without complication ([place]) 43/32/9518   Pseudophakia of both eyes 01/30/2021   Lumbar radiculopathy 01/11/2021   Choroidal nevus, 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   Tear of medial meniscus of knee 12/05/2019   Pain in left knee 09/21/2019   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   Tear of right rotator cuff 01/12/2018   Impingement syndrome of shoulder region 09/26/2017   Coronary artery disease due to lipid rich plaque 03/18/2016   Anxiety state 03/18/2016   Vitamin D deficiency 03/18/2016   Gall bladder disease 03/02/2014   CAD (coronary artery disease) 12/30/2013   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   Osteopenia of the elderly 10/26/2013   Generalized anxiety disorder 09/21/2013   GERD (gastroesophageal reflux disease) 09/21/2013   Multiple pulmonary nodules determined by computed tomography of lung 09/21/2013   SYNCOPE 02/02/2009   Pre-diabetes 02/01/2009   Hyperlipidemia 02/01/2009   Essential hypertension 02/01/2009   DIZZINESS 02/01/2009    Darlin Coco, PT 06/21/2021, 9:23 AM  [facility] [address] [place], [place], [address] Phone: [phone]   Fax:  [phone]  Name: TONDRA REIERSON MRN: [id number] Date of Birth: 28-Jul-1941

## 2021-06-24 ENCOUNTER — Other Ambulatory Visit: Payer: Self-pay

## 2021-06-24 ENCOUNTER — Ambulatory Visit: Payer: Medicare Other

## 2021-06-24 DIAGNOSIS — M5416 Radiculopathy, lumbar region: Secondary | ICD-10-CM | POA: Diagnosis not present

## 2021-06-24 DIAGNOSIS — R293 Abnormal posture: Secondary | ICD-10-CM

## 2021-06-24 DIAGNOSIS — M5442 Lumbago with sciatica, left side: Secondary | ICD-10-CM | POA: Diagnosis not present

## 2021-06-24 DIAGNOSIS — G8929 Other chronic pain: Secondary | ICD-10-CM

## 2021-06-24 DIAGNOSIS — M5441 Lumbago with sciatica, right side: Secondary | ICD-10-CM | POA: Diagnosis not present

## 2021-06-24 NOTE — Therapy (Signed)
[facility] [place], [place], [address] Phone: [phone]   Fax:  [phone]  Physical Therapy Treatment  Patient Details  Name: Misty Smith MRN: [id number] Date of Birth: 05-29-1941 Referring Provider (PT): Darla Lesches   Encounter Date: 06/24/2021   PT End of Session - 06/24/21 0821     Visit Number 3    Number of Visits 12    Date for PT Re-Evaluation 09/16/21    Authorization Type FOTO AT LEAST EVERY 5TH VISIT.  PROGRESS NOTE AT 10TH VISIT.  KX MODIFIER AFTER 15 VISITS.    PT Start Time [place] During Treatment Other (comment)   Personal SPC   Activity Tolerance Patient tolerated treatment well    Behavior During Therapy WFL for tasks assessed/performed             Past Medical History:  Diagnosis Date   Allergy    Anxiety    Arthritis    Bronchitis    Cataract    Colovaginal fistula    Depression    Diabetes mellitus without complication (HCC)    Dysrhythmia    palpitations, occasional PVCs; Dr Stanford Breed cardiologist   Esophageal stricture    Fractured elbow 1970's   GERD (gastroesophageal reflux disease)    H/O hiatal hernia    Hyperlipidemia    Hypertension    Osteopenia    Perimenopausal vasomotor symptoms    Postmenopausal HRT (hormone replacement therapy)    Pre-diabetes    Stress    Subacute lupus erythematosus    Dr. Allyson Sabal    Past Surgical History:  Procedure Laterality Date   abdominal bypass  1987   fibroids    ABDOMINAL HYSTERECTOMY     partial   bilateral tubal ligtation  1972   CHOLECYSTECTOMY N/A 03/21/2014   Procedure: LAPAROSCOPIC CHOLECYSTECTOMY WITH INTRAOPERATIVE CHOLANGIOGRAM;  Surgeon: Shann Medal, MD;  Location: [place];  Service: General;  Laterality: N/A;   COLONOSCOPY     elbow Right 1976   surgery after fracture of elbow   EYE SURGERY Bilateral    cataracts   ROTATOR CUFF REPAIR Right 03/29/2018   TONSILLECTOMY     TUBAL LIGATION       There were no vitals filed for this visit.   Subjective Assessment - 06/24/21 0820     Subjective COVID-19 screen performed prior to patient entering clinic.  Patient reports that her back is slightly sore this morning.    Pertinent History Left knee arthroscopic surgery, osteopenia, HTN, right RTC repair.    How long can you sit comfortably? Not a problem.    How long can you stand comfortably? Less than 10 minutes.    How long can you walk comfortably? Short community distances with a cane.    Patient Stated Goals Be able to do more with less pain.    Currently in Pain? Yes    Pain Score 1     Pain Location Back    Pain Orientation Lower    Pain Onset More than a month ago                               [facility] Adult PT Treatment/Exercise - 06/24/21 0001       Exercises   Exercises Lumbar;Knee/Hip      Lumbar Exercises: Stretches   Single Knee to Chest Stretch Right;3 reps;30 seconds    Other Lumbar  Stretch Exercise LTR, Both, 10 reps with 5 sec hold      Lumbar Exercises: Aerobic   Nustep Lvl 4 x 10 mins      Lumbar Exercises: Supine   Clam 20 reps   Yellow tband     Knee/Hip Exercises: Seated   Marching Both   2 mins     Moist Heat Therapy   Number Minutes Moist Heat 15 Minutes    Moist Heat Location Lumbar Spine      Electrical Stimulation   Electrical Stimulation Location B lower lumbar region    Electrical Stimulation Action IFC at 80 -150 Hz    Electrical Stimulation Parameters 40 % scan x 15 mins    Electrical Stimulation Goals Tone;Pain      Manual Therapy   Manual Therapy Soft tissue mobilization    Soft tissue mobilization Right lumbar paraspinals and gluteals                          PT Long Term Goals - 06/18/21 1646       PT LONG TERM GOAL #1   Title Independent with a HEP.    Time 6    Period Weeks    Status New      PT LONG TERM GOAL #2   Title Stand 15 minutes with pain not > 3-4/10.    Time 6     Period Weeks    Status New      PT LONG TERM GOAL #3   Title Perform houshold activities for 30 minutes without stopping to sit.    Time 6    Period Weeks    Status New      PT LONG TERM GOAL #4   Title Walk a community distance with pain not > 4/10.    Time 6    Period Weeks    Status New                   Plan - 06/24/21 6073     Clinical Impression Statement Pt arrives at treatment reporting mild soreness to lower back.  Pt ambulates into and around the facility with personal SPC.  Pt able to perform all transfers independently.  Pt able to tolerate addition of lumbar trunk stretches with min cues for proper technique and length of hold.  Pt with tightness to lumbar paraspinals and upper gluteals, but pt reports this has majorly decreased since beginning therapy.  Pt reports 1/10 low back pain at completion of today's treatment session.  Pt would benefit from continuation of current plan of care to address limitation and assit with return to PLOF.    Personal Factors and Comorbidities Comorbidity 1;Comorbidity 2;Other    Comorbidities Left knee arthroscopic surgery, osteopenia, HTN, right RTC repair.    Examination-Activity Limitations Locomotion Level;Other;Stand    Examination-Participation Restrictions Other;Meal Prep    Stability/Clinical Decision Making Evolving/Moderate complexity    Rehab Potential Good    PT Frequency 2x / week    PT Duration 6 weeks    PT Treatment/Interventions ADLs/Self Care Home Management;Cryotherapy;Electrical Stimulation;Ultrasound;Moist Heat;Functional mobility training;Therapeutic activities;Therapeutic exercise;Manual techniques;Patient/family education;Passive range of motion;Dry needling    PT Next Visit Plan Combo e'stim/US, STW/M, right SKTC and gentle right hip stretching, postural exercises, core exercise progression.    Consulted and Agree with Plan of Care Patient             Patient will benefit from skilled therapeutic  intervention  in order to improve the following deficits and impairments:  Abnormal gait, Pain, Decreased activity tolerance, Decreased mobility, Decreased range of motion, Postural dysfunction, Increased muscle spasms  Visit Diagnosis: Chronic bilateral low back pain with bilateral sciatica  Abnormal posture     Problem List Patient Active Problem List   Diagnosis Date Noted   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   Upper airway cough syndrome 02/08/2021   Diabetes mellitus without complication ([place]) 16/38/4536   Pseudophakia of both eyes 01/30/2021   Lumbar radiculopathy 01/11/2021   Choroidal nevus, 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   Tear of medial meniscus of knee 12/05/2019   Pain in left knee 09/21/2019   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   Tear of right rotator cuff 01/12/2018   Impingement syndrome of shoulder region 09/26/2017   Coronary artery disease due to lipid rich plaque 03/18/2016   Anxiety state 03/18/2016   Vitamin D deficiency 03/18/2016   Gall bladder disease 03/02/2014   CAD (coronary artery disease) 12/30/2013   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   Osteopenia of the elderly 10/26/2013   Generalized anxiety disorder 09/21/2013   GERD (gastroesophageal reflux disease) 09/21/2013   Multiple pulmonary nodules determined by computed tomography of lung 09/21/2013   SYNCOPE 02/02/2009   Pre-diabetes 02/01/2009   Hyperlipidemia 02/01/2009   Essential hypertension 02/01/2009   DIZZINESS 02/01/2009    Kathrynn Ducking, PTA 06/24/2021, 9:17 AM  [facility] [address] [place], [place], [address] Phone: [phone]   Fax:  [phone]  Name: Misty Smith MRN: [id number] Date of Birth: [birth year]

## 2021-06-27 ENCOUNTER — Other Ambulatory Visit: Payer: Self-pay

## 2021-06-27 ENCOUNTER — Ambulatory Visit: Payer: Medicare Other

## 2021-06-27 DIAGNOSIS — M5442 Lumbago with sciatica, left side: Secondary | ICD-10-CM | POA: Diagnosis not present

## 2021-06-27 DIAGNOSIS — M5441 Lumbago with sciatica, right side: Secondary | ICD-10-CM | POA: Diagnosis not present

## 2021-06-27 DIAGNOSIS — G8929 Other chronic pain: Secondary | ICD-10-CM

## 2021-06-27 DIAGNOSIS — R293 Abnormal posture: Secondary | ICD-10-CM | POA: Diagnosis not present

## 2021-06-27 DIAGNOSIS — M5416 Radiculopathy, lumbar region: Secondary | ICD-10-CM | POA: Diagnosis not present

## 2021-06-27 NOTE — Therapy (Signed)
[facility] [place], [place], [address] Phone: [phone]   Fax:  [phone]  Physical Therapy Treatment  Patient Details  Name: Misty Smith MRN: [id number] Date of Birth: [birth year] Referring Provider (PT): Darla Lesches   Encounter Date: 06/27/2021   PT End of Session - 06/27/21 0821     Visit Number 4    Number of Visits 12    Date for PT Re-Evaluation 09/16/21    Authorization Type FOTO AT LEAST EVERY 5TH VISIT.  PROGRESS NOTE AT 10TH VISIT.  KX MODIFIER AFTER 15 VISITS.    PT Start Time 0818    PT Stop Time 0913    PT Time Calculation (min) 55 min    Equipment Utilized During Treatment Other (comment)   Personal SPC   Activity Tolerance Patient tolerated treatment well    Behavior During Therapy WFL for tasks assessed/performed             Past Medical History:  Diagnosis Date   Allergy    Anxiety    Arthritis    Bronchitis    Cataract    Colovaginal fistula    Depression    Diabetes mellitus without complication (HCC)    Dysrhythmia    palpitations, occasional PVCs; Dr Stanford Breed cardiologist   Esophageal stricture    Fractured elbow 1970's   GERD (gastroesophageal reflux disease)    H/O hiatal hernia    Hyperlipidemia    Hypertension    Osteopenia    Perimenopausal vasomotor symptoms    Postmenopausal HRT (hormone replacement therapy)    Pre-diabetes    Stress    Subacute lupus erythematosus    Dr. Allyson Sabal    Past Surgical History:  Procedure Laterality Date   abdominal bypass  1987   fibroids    ABDOMINAL HYSTERECTOMY     partial   bilateral tubal ligtation  1972   CHOLECYSTECTOMY N/A 03/21/2014   Procedure: LAPAROSCOPIC CHOLECYSTECTOMY WITH INTRAOPERATIVE CHOLANGIOGRAM;  Surgeon: Shann Medal, MD;  Location: [facility];  Service: General;  Laterality: N/A;   COLONOSCOPY     elbow Right 1976   surgery after fracture of elbow   EYE SURGERY Bilateral    cataracts   ROTATOR CUFF  REPAIR Right 03/29/2018   TONSILLECTOMY     TUBAL LIGATION      There were no vitals filed for this visit.   Subjective Assessment - 06/27/21 0820     Subjective COVID-19 screen performed prior to patient entering clinic.  Patient reports that her back is slightly sore today.  Pt states that she felt a little sore after last treatment.    Pertinent History Left knee arthroscopic surgery, osteopenia, HTN, right RTC repair.    How long can you sit comfortably? Not a problem.    How long can you stand comfortably? Less than 10 minutes.    How long can you walk comfortably? Short community distances with a cane.    Patient Stated Goals Be able to do more with less pain.    Currently in Pain? Yes    Pain Score 1     Pain Location Back    Pain Orientation Lower    Pain Onset More than a month ago                               [facility] Adult PT Treatment/Exercise - 06/27/21 0001       Exercises  Exercises Lumbar      Lumbar Exercises: Aerobic   Nustep Lvl 4 x 15 mins      Moist Heat Therapy   Number Minutes Moist Heat 15 Minutes    Moist Heat Location Lumbar Spine      Electrical Stimulation   Electrical Stimulation Location Right lumber and upper glute region    Electrical Stimulation Action IFC at 80 - 150 Hz    Electrical Stimulation Parameters 40% scan x 15 mins    Electrical Stimulation Goals Tone;Pain      Manual Therapy   Manual Therapy Soft tissue mobilization    Soft tissue mobilization Right lumbar paraspinals and upper gluteals, with concentration on glutes                          PT Long Term Goals - 06/18/21 1646       PT LONG TERM GOAL #1   Title Independent with a HEP.    Time 6    Period Weeks    Status New      PT LONG TERM GOAL #2   Title Stand 15 minutes with pain not > 3-4/10.    Time 6    Period Weeks    Status New      PT LONG TERM GOAL #3   Title Perform houshold activities for 30 minutes without  stopping to sit.    Time 6    Period Weeks    Status New      PT LONG TERM GOAL #4   Title Walk a community distance with pain not > 4/10.    Time 6    Period Weeks    Status New                   Plan - 06/27/21 6712     Clinical Impression Statement Pt arrives at today's treatment reporting mild soreness to right lower back.  Pt able to tolerate increased time on Nu Step, reporting slight fatigue and SHOB, SpO2 96% on RA.  STW/M performed to right lumbar paraspinals and upper glutes with emphasis to right upper glutes.  Pt reported decreased tightness and soreness at completion of today's session.  Pt would benenfit from continuation of current POC to address limitation and assist with return to PLOF.    Personal Factors and Comorbidities Comorbidity 1;Comorbidity 2;Other    Comorbidities Left knee arthroscopic surgery, osteopenia, HTN, right RTC repair.    Examination-Activity Limitations Locomotion Level;Other;Stand    Examination-Participation Restrictions Other;Meal Prep    Stability/Clinical Decision Making Evolving/Moderate complexity    Rehab Potential Good    PT Frequency 2x / week    PT Duration 6 weeks    PT Treatment/Interventions ADLs/Self Care Home Management;Cryotherapy;Electrical Stimulation;Ultrasound;Moist Heat;Functional mobility training;Therapeutic activities;Therapeutic exercise;Manual techniques;Patient/family education;Passive range of motion;Dry needling    PT Next Visit Plan Combo e'stim/US, STW/M, right SKTC and gentle right hip stretching, postural exercises, core exercise progression.    Consulted and Agree with Plan of Care Patient             Patient will benefit from skilled therapeutic intervention in order to improve the following deficits and impairments:  Abnormal gait, Pain, Decreased activity tolerance, Decreased mobility, Decreased range of motion, Postural dysfunction, Increased muscle spasms  Visit Diagnosis: Chronic bilateral  low back pain with bilateral sciatica  Abnormal posture     Problem List Patient Active Problem List   Diagnosis Date Noted  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   Upper airway cough syndrome 02/08/2021   Diabetes mellitus without complication ([place]) 58/30/9407   Pseudophakia of both eyes 01/30/2021   Lumbar radiculopathy 01/11/2021   Choroidal nevus, 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   Tear of medial meniscus of knee 12/05/2019   Pain in left knee 09/21/2019   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   Tear of right rotator cuff 01/12/2018   Impingement syndrome of shoulder region 09/26/2017   Coronary artery disease due to lipid rich plaque 03/18/2016   Anxiety state 03/18/2016   Vitamin D deficiency 03/18/2016   Gall bladder disease 03/02/2014   CAD (coronary artery disease) 12/30/2013   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   Osteopenia of the elderly 10/26/2013   Generalized anxiety disorder 09/21/2013   GERD (gastroesophageal reflux disease) 09/21/2013   Multiple pulmonary nodules determined by computed tomography of lung 09/21/2013   SYNCOPE 02/02/2009   Pre-diabetes 02/01/2009   Hyperlipidemia 02/01/2009   Essential hypertension 02/01/2009   DIZZINESS 02/01/2009    Kathrynn Ducking, PTA 06/27/2021, 9:37 AM  [facility] [address] [place], [place], [address] Phone: [phone]   Fax:  [phone]  Name: JANAYSHA DEPAULO MRN: [id number] Date of Birth: 04/22/1941

## 2021-07-01 ENCOUNTER — Ambulatory Visit: Payer: Medicare Other

## 2021-07-01 ENCOUNTER — Other Ambulatory Visit: Payer: Self-pay

## 2021-07-01 DIAGNOSIS — G8929 Other chronic pain: Secondary | ICD-10-CM | POA: Diagnosis not present

## 2021-07-01 DIAGNOSIS — M5441 Lumbago with sciatica, right side: Secondary | ICD-10-CM | POA: Diagnosis not present

## 2021-07-01 DIAGNOSIS — M5442 Lumbago with sciatica, left side: Secondary | ICD-10-CM | POA: Diagnosis not present

## 2021-07-01 DIAGNOSIS — M5416 Radiculopathy, lumbar region: Secondary | ICD-10-CM | POA: Diagnosis not present

## 2021-07-01 DIAGNOSIS — R293 Abnormal posture: Secondary | ICD-10-CM

## 2021-07-01 NOTE — Therapy (Signed)
[facility] [place], [place], [address] Phone: [phone]   Fax:  [phone]  Physical Therapy Treatment  Patient Details  Name: Misty Smith MRN: [id number] Date of Birth: 02/25/1941 Referring Provider (PT): Darla Lesches   Encounter Date: 07/01/2021   PT End of Session - 07/01/21 0812     Visit Number 5    Number of Visits 12    Date for PT Re-Evaluation 09/16/21    Authorization Type FOTO AT LEAST EVERY 5TH VISIT.  PROGRESS NOTE AT 10TH VISIT.  KX MODIFIER AFTER 15 VISITS.    PT Start Time 0815    PT Stop Time 0908    PT Time Calculation (min) 53 min    Equipment Utilized During Treatment Other (comment)   Personal SPC   Activity Tolerance Patient tolerated treatment well    Behavior During Therapy WFL for tasks assessed/performed             Past Medical History:  Diagnosis Date   Allergy    Anxiety    Arthritis    Bronchitis    Cataract    Colovaginal fistula    Depression    Diabetes mellitus without complication (HCC)    Dysrhythmia    palpitations, occasional PVCs; Dr Stanford Breed cardiologist   Esophageal stricture    Fractured elbow 1970's   GERD (gastroesophageal reflux disease)    H/O hiatal hernia    Hyperlipidemia    Hypertension    Osteopenia    Perimenopausal vasomotor symptoms    Postmenopausal HRT (hormone replacement therapy)    Pre-diabetes    Stress    Subacute lupus erythematosus    Dr. Allyson Sabal    Past Surgical History:  Procedure Laterality Date   abdominal bypass  1987   fibroids    ABDOMINAL HYSTERECTOMY     partial   bilateral tubal ligtation  1972   CHOLECYSTECTOMY N/A 03/21/2014   Procedure: LAPAROSCOPIC CHOLECYSTECTOMY WITH INTRAOPERATIVE CHOLANGIOGRAM;  Surgeon: Shann Medal, MD;  Location: [facility];  Service: General;  Laterality: N/A;   COLONOSCOPY     elbow Right 1976   surgery after fracture of elbow   EYE SURGERY Bilateral    cataracts   ROTATOR CUFF  REPAIR Right 03/29/2018   TONSILLECTOMY     TUBAL LIGATION      There were no vitals filed for this visit.   Subjective Assessment - 07/01/21 0812     Subjective COVID-19 screen performed prior to patient entering clinic.  Pt denies any pain, but does endorse tightness in bilateral side of lower back.  Has to sit a lot for church on Sundays.    Pertinent History Left knee arthroscopic surgery, osteopenia, HTN, right RTC repair.    How long can you sit comfortably? Not a problem.    How long can you stand comfortably? Less than 10 minutes.    How long can you walk comfortably? Short community distances with a cane.    Patient Stated Goals Be able to do more with less pain.    Currently in Pain? No/denies    Pain Onset More than a month ago                [facility] PT Assessment - 07/01/21 0001       Observation/Other Assessments   Focus on Therapeutic Outcomes (FOTO)  48   5th visit 10/31  [facility] Adult PT Treatment/Exercise - 07/01/21 0001       Exercises   Exercises Lumbar      Lumbar Exercises: Stretches   Single Knee to Chest Stretch Right;Left   10 reps with 5 sec hold   Single Knee to Chest Stretch Limitations cues for stretch without pain    Lower Trunk Rotation Other (comment)   10 reps with 5 sec hold   Lower Trunk Rotation Limitations cues to hold stretch      Lumbar Exercises: Aerobic   Nustep Lvl 4 x 15 mins      Modalities   Modalities Electrical Stimulation;Moist Heat      Moist Heat Therapy   Number Minutes Moist Heat 15 Minutes    Moist Heat Location Lumbar Spine      Electrical Stimulation   Electrical Stimulation Location Bilateral lower lumbar region    Electrical Stimulation Action IFC at 80 -150 Hz    Electrical Stimulation Parameters 40% scan x 15    Electrical Stimulation Goals Tone;Pain      Manual Therapy   Manual Therapy Soft tissue mobilization    Soft tissue mobilization B lumbar paraspinals  and gluteals                          PT Long Term Goals - 07/01/21 0818       PT LONG TERM GOAL #1   Title Independent with a HEP.    Time 6    Period Weeks    Status On-going      PT LONG TERM GOAL #2   Title Stand 15 minutes with pain not > 3-4/10.    Time 6    Period Weeks    Status Partially Met      PT LONG TERM GOAL #3   Title Perform houshold activities for 30 minutes without stopping to sit.    Time 6    Period Weeks    Status On-going      PT LONG TERM GOAL #4   Title Walk a community distance with pain not > 4/10.    Time 6    Period Weeks    Status On-going                   Plan - 07/01/21 0813     Clinical Impression Statement Pt arrives for treatment today reporting bilateral tightness in low back due to prolonged sitting at church yesterday.  Pt's FOTO decreased from 63 to 48 from 1st to 5th visit.  Pt reports several hours of relief after last treatment, but woke up sore and tight the next morning.  Pt tolerated lumbar stretches well, requiring min cues for hold and to stay within pain free ROM.  Pt with increased tightness to B lower lumbar parapinals, L > R, that improves with STW to region.  Pt reported decreased tightness at completion of today's treatment session.    Personal Factors and Comorbidities Comorbidity 1;Comorbidity 2;Other    Comorbidities Left knee arthroscopic surgery, osteopenia, HTN, right RTC repair.    Examination-Activity Limitations Locomotion Level;Other;Stand    Examination-Participation Restrictions Other;Meal Prep    Stability/Clinical Decision Making Evolving/Moderate complexity    Rehab Potential Good    PT Frequency 2x / week    PT Duration 6 weeks    PT Treatment/Interventions ADLs/Self Care Home Management;Cryotherapy;Electrical Stimulation;Ultrasound;Moist Heat;Functional mobility training;Therapeutic activities;Therapeutic exercise;Manual techniques;Patient/family education;Passive range of  motion;Dry needling    PT Next  Visit Plan Combo e'stim/US, STW/M, right SKTC and gentle right hip stretching, postural exercises, core exercise progression.    Consulted and Agree with Plan of Care Patient             Patient will benefit from skilled therapeutic intervention in order to improve the following deficits and impairments:  Abnormal gait, Pain, Decreased activity tolerance, Decreased mobility, Decreased range of motion, Postural dysfunction, Increased muscle spasms  Visit Diagnosis: Chronic bilateral low back pain with bilateral sciatica  Abnormal posture     Problem List Patient Active Problem List   Diagnosis Date Noted   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   Upper airway cough syndrome 02/08/2021   Diabetes mellitus without complication ([place]) 53/29/9242   Pseudophakia of both eyes 01/30/2021   Lumbar radiculopathy 01/11/2021   Choroidal nevus, 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   Tear of medial meniscus of knee 12/05/2019   Pain in left knee 09/21/2019   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   Tear of right rotator cuff 01/12/2018   Impingement syndrome of shoulder region 09/26/2017   Coronary artery disease due to lipid rich plaque 03/18/2016   Anxiety state 03/18/2016   Vitamin D deficiency 03/18/2016   Gall bladder disease 03/02/2014   CAD (coronary artery disease) 12/30/2013   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   Osteopenia of the elderly 10/26/2013   Generalized anxiety disorder 09/21/2013   GERD (gastroesophageal reflux disease) 09/21/2013   Multiple pulmonary nodules determined by computed tomography of lung 09/21/2013   SYNCOPE 02/02/2009   Pre-diabetes 02/01/2009   Hyperlipidemia 02/01/2009   Essential hypertension 02/01/2009   DIZZINESS 02/01/2009    Kathrynn Ducking, PTA 07/01/2021, 9:53 AM  [facility] [address] [place], [place], [address] Phone: [phone]   Fax:   [phone]  Name: NAKEITHA MILLIGAN MRN: [id number] Date of Birth: Sep 28, 1940

## 2021-07-04 ENCOUNTER — Ambulatory Visit: Payer: Medicare Other | Attending: Family Medicine

## 2021-07-04 ENCOUNTER — Other Ambulatory Visit: Payer: Self-pay

## 2021-07-04 DIAGNOSIS — M5441 Lumbago with sciatica, right side: Secondary | ICD-10-CM | POA: Diagnosis not present

## 2021-07-04 DIAGNOSIS — R293 Abnormal posture: Secondary | ICD-10-CM | POA: Diagnosis not present

## 2021-07-04 DIAGNOSIS — M5442 Lumbago with sciatica, left side: Secondary | ICD-10-CM | POA: Insufficient documentation

## 2021-07-04 DIAGNOSIS — G8929 Other chronic pain: Secondary | ICD-10-CM | POA: Diagnosis not present

## 2021-07-04 NOTE — Therapy (Signed)
[facility] [place], [place], [address] Phone: [phone]   Fax:  [phone]  Physical Therapy Treatment  Patient Details  Name: Misty Smith MRN: [id number] Date of Birth: 03-02-1941 Referring Provider (PT): Darla Lesches   Encounter Date: 07/04/2021   PT End of Session - 07/04/21 0821     Visit Number 6    Number of Visits 12    Date for PT Re-Evaluation 09/16/21    Authorization Type FOTO AT LEAST EVERY 5TH VISIT.  PROGRESS NOTE AT 10TH VISIT.  KX MODIFIER AFTER 15 VISITS.    PT Start Time [id number]    PT Stop Time 0907    PT Time Calculation (min) 51 min    Equipment Utilized During Treatment Other (comment)   Personal SPC   Activity Tolerance Patient tolerated treatment well    Behavior During Therapy WFL for tasks assessed/performed             Past Medical History:  Diagnosis Date   Allergy    Anxiety    Arthritis    Bronchitis    Cataract    Colovaginal fistula    Depression    Diabetes mellitus without complication (HCC)    Dysrhythmia    palpitations, occasional PVCs; Dr Stanford Breed cardiologist   Esophageal stricture    Fractured elbow 1970's   GERD (gastroesophageal reflux disease)    H/O hiatal hernia    Hyperlipidemia    Hypertension    Osteopenia    Perimenopausal vasomotor symptoms    Postmenopausal HRT (hormone replacement therapy)    Pre-diabetes    Stress    Subacute lupus erythematosus    Dr. Allyson Sabal    Past Surgical History:  Procedure Laterality Date   abdominal bypass  1987   fibroids    ABDOMINAL HYSTERECTOMY     partial   bilateral tubal ligtation  1972   CHOLECYSTECTOMY N/A 03/21/2014   Procedure: LAPAROSCOPIC CHOLECYSTECTOMY WITH INTRAOPERATIVE CHOLANGIOGRAM;  Surgeon: Shann Medal, MD;  Location: [facility];  Service: General;  Laterality: N/A;   COLONOSCOPY     elbow Right 1976   surgery after fracture of elbow   EYE SURGERY Bilateral    cataracts   ROTATOR CUFF REPAIR  Right 03/29/2018   TONSILLECTOMY     TUBAL LIGATION      There were no vitals filed for this visit.   Subjective Assessment - 07/04/21 0820     Subjective COVID-19 screen performed prior to patient entering clinic.  Pt reports 3/10 left knee pain upon arriving for today's treatment session.  Pt states that she has not been feeling well since she got her flu shot on Monday.    Pertinent History Left knee arthroscopic surgery, osteopenia, HTN, right RTC repair.    How long can you sit comfortably? Not a problem.    How long can you stand comfortably? Less than 10 minutes.    How long can you walk comfortably? Short community distances with a cane.    Patient Stated Goals Be able to do more with less pain.    Currently in Pain? Yes    Pain Score 3     Pain Location Knee    Pain Orientation Left    Pain Onset More than a month ago                               [facility] Adult PT Treatment/Exercise -  07/04/21 0001       Lumbar Exercises: Stretches   Single Knee to Chest Stretch Right;Left   10 reps x 5 secs   Single Knee to Chest Stretch Limitations cues for hold    Lower Trunk Rotation Other (comment)    Lower Trunk Rotation Limitations cues for hold      Lumbar Exercises: Aerobic   Nustep Lvl 3 x 15 mins   Pt requesting lower resistence due to knee pain     Lumbar Exercises: Supine   Clam 20 reps   supine, yellow tband   Bridge 10 reps                          PT Long Term Goals - 07/01/21 0818       PT LONG TERM GOAL #1   Title Independent with a HEP.    Time 6    Period Weeks    Status On-going      PT LONG TERM GOAL #2   Title Stand 15 minutes with pain not > 3-4/10.    Time 6    Period Weeks    Status Partially Met      PT LONG TERM GOAL #3   Title Perform houshold activities for 30 minutes without stopping to sit.    Time 6    Period Weeks    Status On-going      PT LONG TERM GOAL #4   Title Walk a community distance  with pain not > 4/10.    Time 6    Period Weeks    Status On-going                   Plan - 07/04/21 7371     Clinical Impression Statement Pt arrives for today's treatment session reporting 3/10 left knee pain and bilaterally lower back tightness.  Pt states that she got her flu shot on Monday and has not felt very good since then.  Pt reports feeling much less tight after Nustep warm up.  Pt tolerated addition of bridges with mild increase in low back pain with last rep.  Pt encouraged to continue to perform HEP at home as instructed.  Pt reported 0/10 left knee pain and decreased lower back tightness at completion of today's treatment session.    Personal Factors and Comorbidities Comorbidity 1;Comorbidity 2;Other    Comorbidities Left knee arthroscopic surgery, osteopenia, HTN, right RTC repair.    Examination-Activity Limitations Locomotion Level;Other;Stand    Examination-Participation Restrictions Other;Meal Prep    Stability/Clinical Decision Making Evolving/Moderate complexity    Rehab Potential Good    PT Frequency 2x / week    PT Duration 6 weeks    PT Treatment/Interventions ADLs/Self Care Home Management;Cryotherapy;Electrical Stimulation;Ultrasound;Moist Heat;Functional mobility training;Therapeutic activities;Therapeutic exercise;Manual techniques;Patient/family education;Passive range of motion;Dry needling    PT Next Visit Plan Combo e'stim/US, STW/M, right SKTC and gentle right hip stretching, postural exercises, core exercise progression.    Consulted and Agree with Plan of Care Patient             Patient will benefit from skilled therapeutic intervention in order to improve the following deficits and impairments:  Abnormal gait, Pain, Decreased activity tolerance, Decreased mobility, Decreased range of motion, Postural dysfunction, Increased muscle spasms  Visit Diagnosis: Chronic bilateral low back pain with bilateral sciatica  Abnormal  posture     Problem List Patient Active Problem List   Diagnosis Date Noted  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   Upper airway cough syndrome 02/08/2021   Diabetes mellitus without complication ([place]) 03/54/6568   Pseudophakia of both eyes 01/30/2021   Lumbar radiculopathy 01/11/2021   Choroidal nevus, 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   Tear of medial meniscus of knee 12/05/2019   Pain in left knee 09/21/2019   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   Tear of right rotator cuff 01/12/2018   Impingement syndrome of shoulder region 09/26/2017   Coronary artery disease due to lipid rich plaque 03/18/2016   Anxiety state 03/18/2016   Vitamin D deficiency 03/18/2016   Gall bladder disease 03/02/2014   CAD (coronary artery disease) 12/30/2013   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   Osteopenia of the elderly 10/26/2013   Generalized anxiety disorder 09/21/2013   GERD (gastroesophageal reflux disease) 09/21/2013   Multiple pulmonary nodules determined by computed tomography of lung 09/21/2013   SYNCOPE 02/02/2009   Pre-diabetes 02/01/2009   Hyperlipidemia 02/01/2009   Essential hypertension 02/01/2009   DIZZINESS 02/01/2009    Kathrynn Ducking, PTA 07/04/2021, 9:30 AM  [facility] [address] [place], [place], [address] Phone: [phone]   Fax:  [phone]  Name: Misty Smith MRN: [id number] Date of Birth: 01-Feb-1941

## 2021-07-08 ENCOUNTER — Ambulatory Visit: Payer: Medicare Other

## 2021-07-08 ENCOUNTER — Other Ambulatory Visit: Payer: Self-pay

## 2021-07-08 DIAGNOSIS — M5441 Lumbago with sciatica, right side: Secondary | ICD-10-CM | POA: Diagnosis not present

## 2021-07-08 DIAGNOSIS — R293 Abnormal posture: Secondary | ICD-10-CM | POA: Diagnosis not present

## 2021-07-08 DIAGNOSIS — M5442 Lumbago with sciatica, left side: Secondary | ICD-10-CM | POA: Diagnosis not present

## 2021-07-08 DIAGNOSIS — G8929 Other chronic pain: Secondary | ICD-10-CM

## 2021-07-08 NOTE — Therapy (Signed)
[facility], [place], [address] Phone: [phone]   Fax:  [phone]  Physical Therapy Treatment  Patient Details  Name: Misty Smith MRN: [id number] Date of Birth: 1940/10/19 Referring Provider (PT): Darla Lesches   Encounter Date: 07/08/2021   PT End of Session - 07/08/21 0826     Visit Number 7    Number of Visits 12    Date for PT Re-Evaluation 09/16/21    Authorization Type FOTO AT LEAST EVERY 5TH VISIT.  PROGRESS NOTE AT 10TH VISIT.  KX MODIFIER AFTER 15 VISITS.    PT Start Time 0815    PT Stop Time 0858    PT Time Calculation (min) 43 min    Equipment Utilized During Treatment Other (comment)   Personal SPC   Activity Tolerance Patient tolerated treatment well    Behavior During Therapy WFL for tasks assessed/performed             Past Medical History:  Diagnosis Date   Allergy    Anxiety    Arthritis    Bronchitis    Cataract    Colovaginal fistula    Depression    Diabetes mellitus without complication (HCC)    Dysrhythmia    palpitations, occasional PVCs; Dr Stanford Breed cardiologist   Esophageal stricture    Fractured elbow 1970's   GERD (gastroesophageal reflux disease)    H/O hiatal hernia    Hyperlipidemia    Hypertension    Osteopenia    Perimenopausal vasomotor symptoms    Postmenopausal HRT (hormone replacement therapy)    Pre-diabetes    Stress    Subacute lupus erythematosus    Dr. Allyson Sabal    Past Surgical History:  Procedure Laterality Date   abdominal bypass  1987   fibroids    ABDOMINAL HYSTERECTOMY     partial   bilateral tubal ligtation  1972   CHOLECYSTECTOMY N/A 03/21/2014   Procedure: LAPAROSCOPIC CHOLECYSTECTOMY WITH INTRAOPERATIVE CHOLANGIOGRAM;  Surgeon: Shann Medal, MD;  Location: [facility];  Service: General;  Laterality: N/A;   COLONOSCOPY     elbow Right 1976   surgery after fracture of elbow   EYE SURGERY Bilateral    cataracts   ROTATOR CUFF REPAIR  Right 03/29/2018   TONSILLECTOMY     TUBAL LIGATION      There were no vitals filed for this visit.   Subjective Assessment - 07/08/21 0817     Subjective COVID-19 screen performed prior to patient entering clinic.  Patient reports that she feels alright today. However, she has not been doing much yet to cause her back to hurt. She reports that she felt alright after her last appointment.    Pertinent History Left knee arthroscopic surgery, osteopenia, HTN, right RTC repair.    How long can you sit comfortably? Not a problem.    How long can you stand comfortably? Less than 10 minutes.    How long can you walk comfortably? Short community distances with a cane.    Patient Stated Goals Be able to do more with less pain.    Currently in Pain? No/denies    Pain Onset More than a month ago                               [facility] Adult PT Treatment/Exercise - 07/08/21 0001       Lumbar Exercises: Stretches   Lower Trunk Rotation Other (comment)  2 minutes     Lumbar Exercises: Aerobic   Nustep L3 x 15 minutes      Lumbar Exercises: Supine   Bridge Non-compliant;Other (comment)   2x6   Straight Leg Raise 20 reps   2x10 each     Knee/Hip Exercises: Standing   Forward Lunges Right;20 reps   onto 4" step   Other Standing Knee Exercises Toe taps on 4" step   20 each     Knee/Hip Exercises: Seated   Long Arc Quad Strengthening;Both;20 reps;Weights    Long Arc Quad Weight 2 lbs.                          PT Long Term Goals - 07/01/21 0818       PT LONG TERM GOAL #1   Title Independent with a HEP.    Time 6    Period Weeks    Status On-going      PT LONG TERM GOAL #2   Title Stand 15 minutes with pain not > 3-4/10.    Time 6    Period Weeks    Status Partially Met      PT LONG TERM GOAL #3   Title Perform houshold activities for 30 minutes without stopping to sit.    Time 6    Period Weeks    Status On-going      PT LONG TERM GOAL #4    Title Walk a community distance with pain not > 4/10.    Time 6    Period Weeks    Status On-going                   Plan - 07/08/21 0827     Clinical Impression Statement Patient was progressed with straight leg raises in addition to familiar interventions for improved lower extremity strength and lumbar stability. She required minimal cuing with straight leg raises for proper biomechanics and improved eccentric hip flexor control. She experienced a mild increase in left hip discomfort with this activity, but this did not limit her ability to complete this intervention. She reported a moderate right low back "pulling" with ambulation today. However, this was able to be reduced with lunges onto a 4" step.  She reported feeling good upon the conclusion of treatment. She continues to require skilled physical therapy to address her remaining impairments to return to her prior level of function.    Personal Factors and Comorbidities Comorbidity 1;Comorbidity 2;Other    Comorbidities Left knee arthroscopic surgery, osteopenia, HTN, right RTC repair.    Examination-Activity Limitations Locomotion Level;Other;Stand    Examination-Participation Restrictions Other;Meal Prep    Stability/Clinical Decision Making Evolving/Moderate complexity    Rehab Potential Good    PT Frequency 2x / week    PT Duration 6 weeks    PT Treatment/Interventions ADLs/Self Care Home Management;Cryotherapy;Electrical Stimulation;Ultrasound;Moist Heat;Functional mobility training;Therapeutic activities;Therapeutic exercise;Manual techniques;Patient/family education;Passive range of motion;Dry needling    PT Next Visit Plan Combo e'stim/US, STW/M, right SKTC and gentle right hip stretching, postural exercises, core exercise progression.    Consulted and Agree with Plan of Care Patient             Patient will benefit from skilled therapeutic intervention in order to improve the following deficits and  impairments:  Abnormal gait, Pain, Decreased activity tolerance, Decreased mobility, Decreased range of motion, Postural dysfunction, Increased muscle spasms  Visit Diagnosis: Chronic bilateral low back pain with bilateral sciatica  Abnormal  posture     Problem List Patient Active Problem List   Diagnosis Date Noted   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   Upper airway cough syndrome 02/08/2021   Diabetes mellitus without complication ([facility]) 62/70/3500   Pseudophakia of both eyes 01/30/2021   Lumbar radiculopathy 01/11/2021   Choroidal nevus, 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   Tear of medial meniscus of knee 12/05/2019   Pain in left knee 09/21/2019   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   Tear of right rotator cuff 01/12/2018   Impingement syndrome of shoulder region 09/26/2017   Coronary artery disease due to lipid rich plaque 03/18/2016   Anxiety state 03/18/2016   Vitamin D deficiency 03/18/2016   Gall bladder disease 03/02/2014   CAD (coronary artery disease) 12/30/2013   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   Osteopenia of the elderly 10/26/2013   Generalized anxiety disorder 09/21/2013   GERD (gastroesophageal reflux disease) 09/21/2013   Multiple pulmonary nodules determined by computed tomography of lung 09/21/2013   SYNCOPE 02/02/2009   Pre-diabetes 02/01/2009   Hyperlipidemia 02/01/2009   Essential hypertension 02/01/2009   DIZZINESS 02/01/2009    Darlin Coco, PT 07/08/2021, 9:08 AM  [facility] [address] [place], [place], [address] Phone: [phone]   Fax:  [phone]  Name: Misty Smith MRN: [id number] Date of Birth: Jun 09, 1941

## 2021-07-15 ENCOUNTER — Ambulatory Visit: Payer: Medicare Other | Admitting: Physical Therapy

## 2021-07-15 ENCOUNTER — Other Ambulatory Visit: Payer: Self-pay

## 2021-07-15 DIAGNOSIS — R293 Abnormal posture: Secondary | ICD-10-CM | POA: Diagnosis not present

## 2021-07-15 DIAGNOSIS — M5442 Lumbago with sciatica, left side: Secondary | ICD-10-CM | POA: Diagnosis not present

## 2021-07-15 DIAGNOSIS — G8929 Other chronic pain: Secondary | ICD-10-CM | POA: Diagnosis not present

## 2021-07-15 DIAGNOSIS — M5441 Lumbago with sciatica, right side: Secondary | ICD-10-CM | POA: Diagnosis not present

## 2021-07-15 NOTE — Patient Instructions (Signed)
[facility]   DRY NEEDLING CONSENT FORM   Trigger point dry needling is a physical therapy approach to treat Myofascial Pain and Dysfunction.  Dry Needling (DN) is a valuable and effective way to deactivate myofascial trigger points (muscle knots/pain). It is skilled intervention that uses a thin filiform needle to penetrate the skin and stimulate underlying myofascial trigger points, muscular, and connective tissues for the management of neuromusculoskeletal pain and movement impairments.  A local twitch response (LTR) will be elicited.  This can sometimes feel like a deep ache in the muscle during the procedure. Multiple trigger points in multiple muscles can be treated during each treatment.  No medication of any kind is injected.   As with any medical treatment and procedure, there are possible adverse events.  While significant adverse events are uncommon, they do sometimes occur and must be considered prior to giving consent.  Dry needling often causes a "post needling soreness".  There can be an increase in pain from a couple of hours to 2-3 days, followed by an improvement in the overall pain state. Any time a needle is used there is a risk of infection.  However, we are using new, sterile, and disposable needles; infections are extremely rare. There is a possibility that you may bleed or bruise.  You may feel tired and some nausea following treatment. There is a rare possibility of a pneumothorax (air in the chest cavity). Allergic reaction to nickel in the stainless steel needle. If a nerve is touched, it may cause paresthesia (a prickling/shock sensation) which is usually brief, but may continue for a couple of days.  Following treatment stay hydrated.  Continue regular activities but not too vigorous initially after treatment for 24-48 hours.  Dry Needling is best when combined with other physical therapy interventions such as strengthening, stretching  and other therapeutic modalities.   PLEASE ANSWER THE FOLLOWING QUESTIONS:  Do you have a lack of sensation?   Y/N  Do you have a phobia or fear of needles  Y/N  Are you pregnant?    Y/N If yes:  How many weeks? __________ Do you have any implanted devices?  Y/N If yes:  Pacemaker/Spinal Cord Stimulator/Deep Brain Stimulator/Insulin Pump/Other: ________________ Do you have any implants?  Y/N If yes: Breast/Facial/Pecs/Buttocks/Calves/Hip  Replacement/ Knee Replacement/Other: _________ Do you take any blood thinners?   Y/N If yes: Coumadin (Warfarin)/Other: ___________________ Do you have a bleeding disorder?   Y/N If yes: What kind: _________________________________ Do you take any immunosuppressants?  Y/N If yes:   What kind: _________________________________ Do you take anti-inflammatories?   Y/N If yes: What kind: Advil/Aspirin/Other: ________________ Have you ever been diagnosed with Scoliosis? Y/N Have you had back surgery?   Y/N If yes:  Laminectomy/Fusion/Other: ___________________   I have read, or had read to me, the above.  I have had the opportunity to ask any questions.  All of my questions have been answered to my satisfaction and I understand the risks involved with dry needling.  I consent to examination and treatment at [facility], including dry needling, of any and all of my involved and affected muscles.     Signature: __________________________________     Date:___________________________________________________     

## 2021-07-15 NOTE — Therapy (Signed)
[facility] [place], [place], [address] Phone: [phone]   Fax:  [phone]  Physical Therapy Treatment  Patient Details  Name: Misty Smith MRN: [id number] Date of Birth: [birth year] Referring Provider (PT): Darla Lesches   Encounter Date: 07/15/2021   PT End of Session - 07/15/21 0927     Visit Number 8    Number of Visits 12    Date for PT Re-Evaluation 09/16/21    Authorization Type FOTO AT LEAST EVERY 5TH VISIT.  PROGRESS NOTE AT 10TH VISIT.  KX MODIFIER AFTER 15 VISITS.    PT Start Time [phone]    PT Stop Time 0909    PT Time Calculation (min) 53 min    Activity Tolerance Patient tolerated treatment well    Behavior During Therapy WFL for tasks assessed/performed             Past Medical History:  Diagnosis Date   Allergy    Anxiety    Arthritis    Bronchitis    Cataract    Colovaginal fistula    Depression    Diabetes mellitus without complication (HCC)    Dysrhythmia    palpitations, occasional PVCs; Dr Stanford Breed cardiologist   Esophageal stricture    Fractured elbow 1970's   GERD (gastroesophageal reflux disease)    H/O hiatal hernia    Hyperlipidemia    Hypertension    Osteopenia    Perimenopausal vasomotor symptoms    Postmenopausal HRT (hormone replacement therapy)    Pre-diabetes    Stress    Subacute lupus erythematosus    Dr. Allyson Sabal    Past Surgical History:  Procedure Laterality Date   abdominal bypass  1987   fibroids    ABDOMINAL HYSTERECTOMY     partial   bilateral tubal ligtation  1972   CHOLECYSTECTOMY N/A 03/21/2014   Procedure: LAPAROSCOPIC CHOLECYSTECTOMY WITH INTRAOPERATIVE CHOLANGIOGRAM;  Surgeon: Shann Medal, MD;  Location: [facility];  Service: General;  Laterality: N/A;   COLONOSCOPY     elbow Right 1976   surgery after fracture of elbow   EYE SURGERY Bilateral    cataracts   ROTATOR CUFF REPAIR Right 03/29/2018   TONSILLECTOMY     TUBAL LIGATION      There  were no vitals filed for this visit.   Subjective Assessment - 07/15/21 0853     Subjective COVID-19 screen performed prior to patient entering clinic.  Pain low at rest but increases with movement.    Pertinent History Left knee arthroscopic surgery, osteopenia, HTN, right RTC repair.    How long can you sit comfortably? Not a problem.    How long can you stand comfortably? Less than 10 minutes.    How long can you walk comfortably? Short community distances with a cane.    Patient Stated Goals Be able to do more with less pain.    Currently in Pain? Yes    Pain Score 3     Pain Orientation Right;Left    Pain Descriptors / Indicators Aching;Sore                               [facility] Adult PT Treatment/Exercise - 07/15/21 0001       Exercises   Exercises Knee/Hip      Lumbar Exercises: Aerobic   Nustep Level 3 x 11 minutes.      Modalities   Modalities Electrical Stimulation;Moist Heat  Moist Heat Therapy   Number Minutes Moist Heat 20 Minutes    Moist Heat Location Lumbar Spine      Electrical Stimulation   Electrical Stimulation Location RT LB    Electrical Stimulation Action IFC at 80-150 Hz.    Electrical Stimulation Parameters 40% scan x 15 minutes.    Electrical Stimulation Goals Tone;Pain      Manual Therapy   Manual Therapy Soft tissue mobilization    Soft tissue mobilization STW/M x 13 minutes to patient's right low back and upper glteal region to decrease pain and tone.                          PT Long Term Goals - 07/01/21 0818       PT LONG TERM GOAL #1   Title Independent with a HEP.    Time 6    Period Weeks    Status On-going      PT LONG TERM GOAL #2   Title Stand 15 minutes with pain not > 3-4/10.    Time 6    Period Weeks    Status Partially Met      PT LONG TERM GOAL #3   Title Perform houshold activities for 30 minutes without stopping to sit.    Time 6    Period Weeks    Status On-going       PT LONG TERM GOAL #4   Title Walk a community distance with pain not > 4/10.    Time 6    Period Weeks    Status On-going                   Plan - 07/15/21 0925     Clinical Impression Statement The patient did very well with treatment today.  Her right low back musculature is very guarded.  She responded very well to STW/M and found it be be very helpful.    Personal Factors and Comorbidities Comorbidity 1;Comorbidity 2;Other    Comorbidities Left knee arthroscopic surgery, osteopenia, HTN, right RTC repair.    Examination-Activity Limitations Locomotion Level;Other;Stand    Examination-Participation Restrictions Other;Meal Prep    Stability/Clinical Decision Making Evolving/Moderate complexity    Rehab Potential Good    PT Frequency 2x / week    PT Duration 6 weeks    PT Treatment/Interventions ADLs/Self Care Home Management;Cryotherapy;Electrical Stimulation;Ultrasound;Moist Heat;Functional mobility training;Therapeutic activities;Therapeutic exercise;Manual techniques;Patient/family education;Passive range of motion;Dry needling    PT Next Visit Plan Combo e'stim/US, STW/M, right SKTC and gentle right hip stretching, postural exercises, core exercise progression.    Consulted and Agree with Plan of Care Patient             Patient will benefit from skilled therapeutic intervention in order to improve the following deficits and impairments:  Abnormal gait, Pain, Decreased activity tolerance, Decreased mobility, Decreased range of motion, Postural dysfunction, Increased muscle spasms  Visit Diagnosis: Chronic bilateral low back pain with bilateral sciatica  Abnormal posture     Problem List Patient Active Problem List   Diagnosis Date Noted   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   Upper airway cough syndrome 02/08/2021   Diabetes mellitus without complication ([place]) 62/70/3500   Pseudophakia of both eyes 01/30/2021   Lumbar radiculopathy 01/11/2021   Choroidal  nevus, 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   Tear of medial meniscus of knee 12/05/2019   Pain in left knee 09/21/2019   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   Tear of right  rotator cuff 01/12/2018   Impingement syndrome of shoulder region 09/26/2017   Coronary artery disease due to lipid rich plaque 03/18/2016   Anxiety state 03/18/2016   Vitamin D deficiency 03/18/2016   Gall bladder disease 03/02/2014   CAD (coronary artery disease) 12/30/2013   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   Osteopenia of the elderly 10/26/2013   Generalized anxiety disorder 09/21/2013   GERD (gastroesophageal reflux disease) 09/21/2013   Multiple pulmonary nodules determined by computed tomography of lung 09/21/2013   SYNCOPE 02/02/2009   Pre-diabetes 02/01/2009   Hyperlipidemia 02/01/2009   Essential hypertension 02/01/2009   DIZZINESS 02/01/2009    Latina Frank, [place], PT 07/15/2021, 9:29 AM  [facility] [address] [place], [place], [address] Phone: [phone]   Fax:  [phone]  Name: Misty Smith MRN: [id number] Date of Birth: 11-Feb-1941

## 2021-07-18 ENCOUNTER — Other Ambulatory Visit: Payer: Self-pay

## 2021-07-18 ENCOUNTER — Ambulatory Visit: Payer: Medicare Other

## 2021-07-18 DIAGNOSIS — M5441 Lumbago with sciatica, right side: Secondary | ICD-10-CM | POA: Diagnosis not present

## 2021-07-18 DIAGNOSIS — M5442 Lumbago with sciatica, left side: Secondary | ICD-10-CM | POA: Diagnosis not present

## 2021-07-18 DIAGNOSIS — G8929 Other chronic pain: Secondary | ICD-10-CM

## 2021-07-18 DIAGNOSIS — R293 Abnormal posture: Secondary | ICD-10-CM | POA: Diagnosis not present

## 2021-07-18 NOTE — Therapy (Signed)
[facility] [place], [place], [address] Phone: [phone]   Fax:  [phone]  Physical Therapy Treatment  Patient Details  Name: Misty Smith MRN: [id number] Date of Birth: 11/16/1940 Referring Provider (PT): Darla Lesches   Encounter Date: 07/18/2021   PT End of Session - 07/18/21 0828     Visit Number 9    Number of Visits 12    Date for PT Re-Evaluation 09/16/21    Authorization Type FOTO AT LEAST EVERY 5TH VISIT.  PROGRESS NOTE AT 10TH VISIT.  KX MODIFIER AFTER 15 VISITS.    PT Start Time [phone]    PT Stop Time 0906    PT Time Calculation (min) 50 min    Activity Tolerance Patient tolerated treatment well    Behavior During Therapy WFL for tasks assessed/performed             Past Medical History:  Diagnosis Date   Allergy    Anxiety    Arthritis    Bronchitis    Cataract    Colovaginal fistula    Depression    Diabetes mellitus without complication (HCC)    Dysrhythmia    palpitations, occasional PVCs; Dr Stanford Breed cardiologist   Esophageal stricture    Fractured elbow 1970's   GERD (gastroesophageal reflux disease)    H/O hiatal hernia    Hyperlipidemia    Hypertension    Osteopenia    Perimenopausal vasomotor symptoms    Postmenopausal HRT (hormone replacement therapy)    Pre-diabetes    Stress    Subacute lupus erythematosus    Dr. Allyson Sabal    Past Surgical History:  Procedure Laterality Date   abdominal bypass  1987   fibroids    ABDOMINAL HYSTERECTOMY     partial   bilateral tubal ligtation  1972   CHOLECYSTECTOMY N/A 03/21/2014   Procedure: LAPAROSCOPIC CHOLECYSTECTOMY WITH INTRAOPERATIVE CHOLANGIOGRAM;  Surgeon: Shann Medal, MD;  Location: [facility];  Service: General;  Laterality: N/A;   COLONOSCOPY     elbow Right 1976   surgery after fracture of elbow   EYE SURGERY Bilateral    cataracts   ROTATOR CUFF REPAIR Right 03/29/2018   TONSILLECTOMY     TUBAL LIGATION      There  were no vitals filed for this visit.   Subjective Assessment - 07/18/21 0816     Subjective COVID-19 screen performed prior to patient entering clinic.  Pt denies pain today, but does endorse some stiffness    Pertinent History Left knee arthroscopic surgery, osteopenia, HTN, right RTC repair.    How long can you sit comfortably? Not a problem.    How long can you stand comfortably? Less than 10 minutes.    How long can you walk comfortably? Short community distances with a cane.    Patient Stated Goals Be able to do more with less pain.    Currently in Pain? No/denies                               [facility] Adult PT Treatment/Exercise - 07/18/21 0001       Exercises   Exercises Lumbar      Lumbar Exercises: Stretches   Single Knee to Chest Stretch Right;Left   10 reps with 5 sec hold   Lower Trunk Rotation Other (comment)   10 reps with 5 sec hold     Lumbar Exercises: Aerobic   Nustep  Lvl 3 x 15 mins      Lumbar Exercises: Supine   Bridge Non-compliant;10 reps      Manual Therapy   Manual Therapy Soft tissue mobilization    Soft tissue mobilization STW/M to patient's right low back and upper glteal region to decrease pain and tone.                          PT Long Term Goals - 07/01/21 0818       PT LONG TERM GOAL #1   Title Independent with a HEP.    Time 6    Period Weeks    Status On-going      PT LONG TERM GOAL #2   Title Stand 15 minutes with pain not > 3-4/10.    Time 6    Period Weeks    Status Partially Met      PT LONG TERM GOAL #3   Title Perform houshold activities for 30 minutes without stopping to sit.    Time 6    Period Weeks    Status On-going      PT LONG TERM GOAL #4   Title Walk a community distance with pain not > 4/10.    Time 6    Period Weeks    Status On-going                   Plan - 07/18/21 0829     Clinical Impression Statement Pt arrives to today's treatment session deying any  pain, but does endorse some stiffness in her lower back.  Pt tolerated gentle stretches/exercises to low back musculature without complaint.  STW/M performed to right low back musculature with good results.  Pt denied any pain at completion of today's treatment session.    Personal Factors and Comorbidities Comorbidity 1;Comorbidity 2;Other    Comorbidities Left knee arthroscopic surgery, osteopenia, HTN, right RTC repair.    Examination-Activity Limitations Locomotion Level;Other;Stand    Examination-Participation Restrictions Other;Meal Prep    Stability/Clinical Decision Making Evolving/Moderate complexity    Rehab Potential Good    PT Frequency 2x / week    PT Duration 6 weeks    PT Treatment/Interventions ADLs/Self Care Home Management;Cryotherapy;Electrical Stimulation;Ultrasound;Moist Heat;Functional mobility training;Therapeutic activities;Therapeutic exercise;Manual techniques;Patient/family education;Passive range of motion;Dry needling    PT Next Visit Plan Combo e'stim/US, STW/M, right SKTC and gentle right hip stretching, postural exercises, core exercise progression.    Consulted and Agree with Plan of Care Patient             Patient will benefit from skilled therapeutic intervention in order to improve the following deficits and impairments:  Abnormal gait, Pain, Decreased activity tolerance, Decreased mobility, Decreased range of motion, Postural dysfunction, Increased muscle spasms  Visit Diagnosis: Chronic bilateral low back pain with bilateral sciatica  Abnormal posture     Problem List Patient Active Problem List   Diagnosis Date Noted   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   Upper airway cough syndrome 02/08/2021   Diabetes mellitus without complication ([place]) 46/56/8127   Pseudophakia of both eyes 01/30/2021   Lumbar radiculopathy 01/11/2021   Choroidal nevus, 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   Tear of medial meniscus of knee 12/05/2019   Pain in left knee 09/21/2019    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   Tear of right rotator cuff 01/12/2018   Impingement syndrome of shoulder region 09/26/2017   Coronary artery disease due to lipid rich plaque 03/18/2016   Anxiety state  03/18/2016   Vitamin D deficiency 03/18/2016   Gall bladder disease 03/02/2014   CAD (coronary artery disease) 12/30/2013   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   Osteopenia of the elderly 10/26/2013   Generalized anxiety disorder 09/21/2013   GERD (gastroesophageal reflux disease) 09/21/2013   Multiple pulmonary nodules determined by computed tomography of lung 09/21/2013   SYNCOPE 02/02/2009   Pre-diabetes 02/01/2009   Hyperlipidemia 02/01/2009   Essential hypertension 02/01/2009   DIZZINESS 02/01/2009    Kathrynn Ducking, PTA 07/18/2021, 9:07 AM  [facility] [place], [place], [address] Phone: [phone]   Fax:  [phone]  Name: Misty Smith MRN: [id number] Date of Birth: 1941/03/17

## 2021-07-23 ENCOUNTER — Telehealth: Payer: Self-pay | Admitting: Family Medicine

## 2021-07-23 ENCOUNTER — Ambulatory Visit: Payer: Medicare Other | Admitting: Physical Therapy

## 2021-07-23 ENCOUNTER — Other Ambulatory Visit: Payer: Self-pay

## 2021-07-23 ENCOUNTER — Other Ambulatory Visit: Payer: Self-pay | Admitting: Family Medicine

## 2021-07-23 DIAGNOSIS — R7303 Prediabetes: Secondary | ICD-10-CM

## 2021-07-23 DIAGNOSIS — R293 Abnormal posture: Secondary | ICD-10-CM | POA: Diagnosis not present

## 2021-07-23 DIAGNOSIS — M5442 Lumbago with sciatica, left side: Secondary | ICD-10-CM

## 2021-07-23 DIAGNOSIS — G8929 Other chronic pain: Secondary | ICD-10-CM | POA: Diagnosis not present

## 2021-07-23 DIAGNOSIS — M5441 Lumbago with sciatica, right side: Secondary | ICD-10-CM | POA: Diagnosis not present

## 2021-07-23 DIAGNOSIS — E782 Mixed hyperlipidemia: Secondary | ICD-10-CM

## 2021-07-23 DIAGNOSIS — E7800 Pure hypercholesterolemia, unspecified: Secondary | ICD-10-CM

## 2021-07-23 NOTE — Therapy (Signed)
[facility] [place], [place], [address] Phone: [phone]   Fax:  [phone]  Physical Therapy Treatment  Patient Details  Name: Misty Smith MRN: [id number] Date of Birth: 06/01/1941 Referring Provider (PT): Darla Lesches   Encounter Date: 07/23/2021   PT End of Session - 07/23/21 0828     Visit Number 10    Number of Visits 12    Date for PT Re-Evaluation 09/16/21    Authorization Type FOTO AT LEAST EVERY 5TH VISIT. (10th visit 26)  PROGRESS NOTE AT 10TH VISIT.  KX MODIFIER AFTER 15 VISITS.    PT Start Time [phone]    PT Stop Time 0858    PT Time Calculation (min) 41 min    Activity Tolerance Patient tolerated treatment well    Behavior During Therapy WFL for tasks assessed/performed             Past Medical History:  Diagnosis Date   Allergy    Anxiety    Arthritis    Bronchitis    Cataract    Colovaginal fistula    Depression    Diabetes mellitus without complication (HCC)    Dysrhythmia    palpitations, occasional PVCs; Dr Stanford Breed cardiologist   Esophageal stricture    Fractured elbow 1970's   GERD (gastroesophageal reflux disease)    H/O hiatal hernia    Hyperlipidemia    Hypertension    Osteopenia    Perimenopausal vasomotor symptoms    Postmenopausal HRT (hormone replacement therapy)    Pre-diabetes    Stress    Subacute lupus erythematosus    Dr. Allyson Sabal    Past Surgical History:  Procedure Laterality Date   abdominal bypass  1987   fibroids    ABDOMINAL HYSTERECTOMY     partial   bilateral tubal ligtation  1972   CHOLECYSTECTOMY N/A 03/21/2014   Procedure: LAPAROSCOPIC CHOLECYSTECTOMY WITH INTRAOPERATIVE CHOLANGIOGRAM;  Surgeon: Shann Medal, MD;  Location: [facility];  Service: General;  Laterality: N/A;   COLONOSCOPY     elbow Right 1976   surgery after fracture of elbow   EYE SURGERY Bilateral    cataracts   ROTATOR CUFF REPAIR Right 03/29/2018   TONSILLECTOMY     TUBAL  LIGATION      There were no vitals filed for this visit.   Subjective Assessment - 07/23/21 0819     Subjective COVID-19 screen performed prior to patient entering clinic.  Pt reported no pain at rest and up to 3/10 with activity    Pertinent History Left knee arthroscopic surgery, osteopenia, HTN, right RTC repair.    How long can you sit comfortably? Not a problem.    How long can you stand comfortably? Less than 10 minutes.    How long can you walk comfortably? Short community distances with a cane.    Patient Stated Goals Be able to do more with less pain.    Currently in Pain? No/denies                               [facility] Adult PT Treatment/Exercise - 07/23/21 0001       Lumbar Exercises: Aerobic   Nustep L3 x 29min UE/LE monitored      Lumbar Exercises: Standing   Other Standing Lumbar Exercises standing glut set 5sec x10      Lumbar Exercises: Seated   Other Seated Lumbar Exercises seated glut set 5sec  x10      Lumbar Exercises: Supine   Other Supine Lumbar Exercises supine glut set 5 sec x10      Manual Therapy   Manual Therapy Soft tissue mobilization    Soft tissue mobilization STW/M to patient's right low back and upper glteal region to decrease pain and tone.                     PT Education - 07/23/21 [phone]     Education Details HEP    Person(s) Educated Patient    Methods Explanation;Demonstration;Handout    Comprehension Verbalized understanding;Returned demonstration                 PT Long Term Goals - 07/23/21 3419       PT LONG TERM GOAL #1   Title Independent with a HEP.    Time 6    Period Weeks    Status On-going      PT LONG TERM GOAL #2   Title Stand 15 minutes with pain not > 3-4/10.    Baseline Pt reported 4-5/10 07/23/21    Time 6    Period Weeks    Status Partially Met      PT LONG TERM GOAL #3   Title Perform houshold activities for 30 minutes without stopping to sit.    Baseline only  able to perform 10 min at a time 07/23/21    Time 6    Period Weeks    Status On-going      PT LONG TERM GOAL #4   Title Walk a community distance with pain not > 4/10.    Baseline pt reported 4-5/10 07/23/21    Time 6    Period Weeks    Status On-going                   Plan - 07/23/21 0848     Clinical Impression Statement Patient arrived with no pain yet with activity pain increase up to 4-5/10. Patient reported doing some exercises at home. Today issued HEP for light progression. Patient limited with prolong standing,  walking & ADL's. Pt reported overall improvement. Goals progressing this week.    Personal Factors and Comorbidities Comorbidity 1;Comorbidity 2;Other    Comorbidities Left knee arthroscopic surgery, osteopenia, HTN, right RTC repair.    Examination-Activity Limitations Locomotion Level;Other;Stand    Examination-Participation Restrictions Other;Meal Prep    Stability/Clinical Decision Making Evolving/Moderate complexity    Rehab Potential Good    PT Frequency 2x / week    PT Duration 6 weeks    PT Treatment/Interventions ADLs/Self Care Home Management;Cryotherapy;Electrical Stimulation;Ultrasound;Moist Heat;Functional mobility training;Therapeutic activities;Therapeutic exercise;Manual techniques;Patient/family education;Passive range of motion;Dry needling    PT Next Visit Plan Combo e'stim/US, STW/M, right SKTC and gentle right hip stretching, postural exercises, core exercise progression.    Consulted and Agree with Plan of Care Patient             Patient will benefit from skilled therapeutic intervention in order to improve the following deficits and impairments:  Abnormal gait, Pain, Decreased activity tolerance, Decreased mobility, Decreased range of motion, Postural dysfunction, Increased muscle spasms  Visit Diagnosis: Chronic bilateral low back pain with bilateral sciatica  Abnormal posture     Problem List Patient Active Problem  List   Diagnosis Date Noted   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   Upper airway cough syndrome 02/08/2021   Diabetes mellitus without complication ([place]) 37/90/2409   Pseudophakia of both eyes  01/30/2021   Lumbar radiculopathy 01/11/2021   Choroidal nevus, 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   Tear of medial meniscus of knee 12/05/2019   Pain in left knee 09/21/2019   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   Tear of right rotator cuff 01/12/2018   Impingement syndrome of shoulder region 09/26/2017   Coronary artery disease due to lipid rich plaque 03/18/2016   Anxiety state 03/18/2016   Vitamin D deficiency 03/18/2016   Gall bladder disease 03/02/2014   CAD (coronary artery disease) 12/30/2013   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   Osteopenia of the elderly 10/26/2013   Generalized anxiety disorder 09/21/2013   GERD (gastroesophageal reflux disease) 09/21/2013   Multiple pulmonary nodules determined by computed tomography of lung 09/21/2013   SYNCOPE 02/02/2009   Pre-diabetes 02/01/2009   Hyperlipidemia 02/01/2009   Essential hypertension 02/01/2009   DIZZINESS 02/01/2009    Yussuf Sawyers P, PTA 07/23/2021, 9:07 AM  [facility] [address] [place], [place], [address] Phone: [phone]   Fax:  [phone]  Name: Misty Smith MRN: [id number] Date of Birth: August 02, 1941    Progress Note Reporting Period 06/18/21 to 07/23/21  See note below for Objective Data and Assessment of Progress/Goals. Patient progressing toward goals and reporting less pain.    Mali Applegate MPT

## 2021-07-23 NOTE — Patient Instructions (Addendum)
     GLUTEAL SET - laying down sitting and standing Squeeze buttocks and hold. Repeat.  5 sec hold x10reps  1 x day Can increase up to 10 sec holds And increase up to 30 reps when able

## 2021-07-23 NOTE — Telephone Encounter (Signed)
Orders in 

## 2021-07-29 ENCOUNTER — Ambulatory Visit: Payer: Medicare Other

## 2021-07-29 ENCOUNTER — Other Ambulatory Visit: Payer: Self-pay

## 2021-07-29 DIAGNOSIS — R293 Abnormal posture: Secondary | ICD-10-CM

## 2021-07-29 DIAGNOSIS — M5441 Lumbago with sciatica, right side: Secondary | ICD-10-CM | POA: Diagnosis not present

## 2021-07-29 DIAGNOSIS — G8929 Other chronic pain: Secondary | ICD-10-CM

## 2021-07-29 DIAGNOSIS — M5442 Lumbago with sciatica, left side: Secondary | ICD-10-CM | POA: Diagnosis not present

## 2021-07-29 NOTE — Therapy (Signed)
[facility] [place], [place], [address] Phone: [phone]   Fax:  [phone]  Physical Therapy Treatment  Patient Details  Name: Misty Smith MRN: [id number] Date of Birth: 1941-07-01 Referring Provider (PT): Darla Lesches   Encounter Date: 07/29/2021   PT End of Session - 07/29/21 0824     Visit Number 11    Number of Visits 12    Date for PT Re-Evaluation 09/16/21    Authorization Type FOTO AT LEAST EVERY 5TH VISIT. (10th visit 54)  PROGRESS NOTE AT 10TH VISIT.  KX MODIFIER AFTER 15 VISITS.    PT Start Time 0820    PT Stop Time 0908    PT Time Calculation (min) 48 min    Activity Tolerance Patient tolerated treatment well    Behavior During Therapy WFL for tasks assessed/performed             Past Medical History:  Diagnosis Date   Allergy    Anxiety    Arthritis    Bronchitis    Cataract    Colovaginal fistula    Depression    Diabetes mellitus without complication (HCC)    Dysrhythmia    palpitations, occasional PVCs; Dr Stanford Breed cardiologist   Esophageal stricture    Fractured elbow 1970's   GERD (gastroesophageal reflux disease)    H/O hiatal hernia    Hyperlipidemia    Hypertension    Osteopenia    Perimenopausal vasomotor symptoms    Postmenopausal HRT (hormone replacement therapy)    Pre-diabetes    Stress    Subacute lupus erythematosus    Dr. Allyson Sabal    Past Surgical History:  Procedure Laterality Date   abdominal bypass  1987   fibroids    ABDOMINAL HYSTERECTOMY     partial   bilateral tubal ligtation  1972   CHOLECYSTECTOMY N/A 03/21/2014   Procedure: LAPAROSCOPIC CHOLECYSTECTOMY WITH INTRAOPERATIVE CHOLANGIOGRAM;  Surgeon: Shann Medal, MD;  Location: [facility];  Service: General;  Laterality: N/A;   COLONOSCOPY     elbow Right 1976   surgery after fracture of elbow   EYE SURGERY Bilateral    cataracts   ROTATOR CUFF REPAIR Right 03/29/2018   TONSILLECTOMY     TUBAL  LIGATION      There were no vitals filed for this visit.   Subjective Assessment - 07/29/21 0823     Subjective COVID-19 screen performed prior to patient entering clinic.  Pt reporting 3/10 left low back and hip pain and stiffness upon arriving for today's treatment session.    Pertinent History Left knee arthroscopic surgery, osteopenia, HTN, right RTC repair.    How long can you sit comfortably? Not a problem.    How long can you stand comfortably? Less than 10 minutes.    How long can you walk comfortably? Short community distances with a cane.    Patient Stated Goals Be able to do more with less pain.    Currently in Pain? Yes    Pain Score 3     Pain Location Hip    Pain Orientation Left                               [facility] Adult PT Treatment/Exercise - 07/29/21 0001       Lumbar Exercises: Stretches   Single Knee to Chest Stretch Left;5 reps;10 seconds   slight over pressure   Lower Trunk Rotation 5  reps;10 seconds   slight over pressure   Piriformis Stretch 4 reps;30 seconds   cues for hold     Lumbar Exercises: Aerobic   Nustep Lvl 4 x 15 mins      Modalities   Modalities Electrical Stimulation      Moist Heat Therapy   Number Minutes Moist Heat 15 Minutes    Moist Heat Location Lumbar Spine      Electrical Stimulation   Electrical Stimulation Location Left low back and gluteal region    Electrical Stimulation Action IFC at 80-150 Hz    Electrical Stimulation Parameters 40% scan x 15 mins    Electrical Stimulation Goals Tone;Pain                          PT Long Term Goals - 07/23/21 3500       PT LONG TERM GOAL #1   Title Independent with a HEP.    Time 6    Period Weeks    Status On-going      PT LONG TERM GOAL #2   Title Stand 15 minutes with pain not > 3-4/10.    Baseline Pt reported 4-5/10 07/23/21    Time 6    Period Weeks    Status Partially Met      PT LONG TERM GOAL #3   Title Perform houshold  activities for 30 minutes without stopping to sit.    Baseline only able to perform 10 min at a time 07/23/21    Time 6    Period Weeks    Status On-going      PT LONG TERM GOAL #4   Title Walk a community distance with pain not > 4/10.    Baseline pt reported 4-5/10 07/23/21    Time 6    Period Weeks    Status On-going                   Plan - 07/29/21 0825     Clinical Impression Statement Pt arrived for today's treatment reporting 3/10 left low back and hip pain.  Pt tolerated supine stretches with slight over pressure well and encouraged to perform them at home.  Pt instructed to perform standing stretches while singing in church on Sundays, as sitting for 2 hours on the hard pews tends to upset her back.  Pt also encouraged to take a lumbar pillow to utilize during church to assist with posture and reduce pain.  Pt reported decreased stiffness at completion of treatment with 0/10 pain.    Personal Factors and Comorbidities Comorbidity 1;Comorbidity 2;Other    Comorbidities Left knee arthroscopic surgery, osteopenia, HTN, right RTC repair.    Examination-Activity Limitations Locomotion Level;Other;Stand    Examination-Participation Restrictions Other;Meal Prep    Stability/Clinical Decision Making Evolving/Moderate complexity    Rehab Potential Good    PT Frequency 2x / week    PT Duration 6 weeks    PT Treatment/Interventions ADLs/Self Care Home Management;Cryotherapy;Electrical Stimulation;Ultrasound;Moist Heat;Functional mobility training;Therapeutic activities;Therapeutic exercise;Manual techniques;Patient/family education;Passive range of motion;Dry needling    PT Next Visit Plan Combo e'stim/US, STW/M, right SKTC and gentle right hip stretching, postural exercises, core exercise progression.    Consulted and Agree with Plan of Care Patient             Patient will benefit from skilled therapeutic intervention in order to improve the following deficits and  impairments:  Abnormal gait, Pain, Decreased activity tolerance, Decreased mobility, Decreased  range of motion, Postural dysfunction, Increased muscle spasms  Visit Diagnosis: Chronic bilateral low back pain with bilateral sciatica  Abnormal posture     Problem List Patient Active Problem List   Diagnosis Date Noted   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   Upper airway cough syndrome 02/08/2021   Diabetes mellitus without complication ([place]) 84/69/6295   Pseudophakia of both eyes 01/30/2021   Lumbar radiculopathy 01/11/2021   Choroidal nevus, 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   Tear of medial meniscus of knee 12/05/2019   Pain in left knee 09/21/2019   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   Tear of right rotator cuff 01/12/2018   Impingement syndrome of shoulder region 09/26/2017   Coronary artery disease due to lipid rich plaque 03/18/2016   Anxiety state 03/18/2016   Vitamin D deficiency 03/18/2016   Gall bladder disease 03/02/2014   CAD (coronary artery disease) 12/30/2013   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   Osteopenia of the elderly 10/26/2013   Generalized anxiety disorder 09/21/2013   GERD (gastroesophageal reflux disease) 09/21/2013   Multiple pulmonary nodules determined by computed tomography of lung 09/21/2013   SYNCOPE 02/02/2009   Pre-diabetes 02/01/2009   Hyperlipidemia 02/01/2009   Essential hypertension 02/01/2009   DIZZINESS 02/01/2009    Kathrynn Ducking, PTA 07/29/2021, 9:41 AM  [facility] [address] [place], [place], [address] Phone: [phone]   Fax:  [phone]  Name: Misty Smith MRN: [id number] Date of Birth: 1940/11/04

## 2021-07-30 ENCOUNTER — Other Ambulatory Visit: Payer: Self-pay | Admitting: Internal Medicine

## 2021-07-30 ENCOUNTER — Other Ambulatory Visit: Payer: Medicare Other

## 2021-07-30 DIAGNOSIS — E1142 Type 2 diabetes mellitus with diabetic polyneuropathy: Secondary | ICD-10-CM | POA: Diagnosis not present

## 2021-07-30 DIAGNOSIS — E782 Mixed hyperlipidemia: Secondary | ICD-10-CM

## 2021-07-30 DIAGNOSIS — B351 Tinea unguium: Secondary | ICD-10-CM | POA: Diagnosis not present

## 2021-07-30 DIAGNOSIS — R7303 Prediabetes: Secondary | ICD-10-CM

## 2021-07-30 DIAGNOSIS — I10 Essential (primary) hypertension: Secondary | ICD-10-CM

## 2021-07-30 DIAGNOSIS — L84 Corns and callosities: Secondary | ICD-10-CM | POA: Diagnosis not present

## 2021-07-30 DIAGNOSIS — M79676 Pain in unspecified toe(s): Secondary | ICD-10-CM | POA: Diagnosis not present

## 2021-07-30 LAB — BAYER DCA HB A1C WAIVED: HB A1C (BAYER DCA - WAIVED): 6.3 % — ABNORMAL HIGH (ref 4.8–5.6)

## 2021-07-31 ENCOUNTER — Other Ambulatory Visit: Payer: Self-pay

## 2021-07-31 ENCOUNTER — Ambulatory Visit: Payer: Medicare Other

## 2021-07-31 DIAGNOSIS — M5441 Lumbago with sciatica, right side: Secondary | ICD-10-CM | POA: Diagnosis not present

## 2021-07-31 DIAGNOSIS — M5442 Lumbago with sciatica, left side: Secondary | ICD-10-CM

## 2021-07-31 DIAGNOSIS — G8929 Other chronic pain: Secondary | ICD-10-CM

## 2021-07-31 DIAGNOSIS — R293 Abnormal posture: Secondary | ICD-10-CM

## 2021-07-31 LAB — CMP14+EGFR
ALT: 23 IU/L (ref 0–32)
AST: 31 IU/L (ref 0–40)
Albumin/Globulin Ratio: 1.7 (ref 1.2–2.2)
Albumin: 4.2 g/dL (ref 3.6–4.6)
Alkaline Phosphatase: 73 IU/L (ref 44–121)
BUN/Creatinine Ratio: 19 (ref 12–28)
BUN: 15 mg/dL (ref 8–27)
Bilirubin Total: 0.4 mg/dL (ref 0.0–1.2)
CO2: 23 mmol/L (ref 20–29)
Calcium: 10.1 mg/dL (ref 8.7–10.3)
Chloride: 98 mmol/L (ref 96–106)
Creatinine, Ser: 0.8 mg/dL (ref 0.57–1.00)
Globulin, Total: 2.5 g/dL (ref 1.5–4.5)
Glucose: 111 mg/dL — ABNORMAL HIGH (ref 70–99)
Potassium: 4.8 mmol/L (ref 3.5–5.2)
Sodium: 139 mmol/L (ref 134–144)
Total Protein: 6.7 g/dL (ref 6.0–8.5)
eGFR: 74 mL/min/{1.73_m2} (ref >59)

## 2021-07-31 LAB — CBC WITH DIFFERENTIAL/PLATELET
Basophils Absolute: 0 10*3/uL (ref 0.0–0.2)
Basos: 1 %
EOS (ABSOLUTE): 0.3 10*3/uL (ref 0.0–0.4)
Eos: 4 %
Hematocrit: 37 % (ref 34.0–46.6)
Hemoglobin: 12.5 g/dL (ref 11.1–15.9)
Immature Grans (Abs): 0 10*3/uL (ref 0.0–0.1)
Immature Granulocytes: 0 %
Lymphocytes Absolute: 1.7 10*3/uL (ref 0.7–3.1)
Lymphs: 26 %
MCH: 31.3 pg (ref 26.6–33.0)
MCHC: 33.8 g/dL (ref 31.5–35.7)
MCV: 93 fL (ref 79–97)
Monocytes Absolute: 0.7 10*3/uL (ref 0.1–0.9)
Monocytes: 11 %
Neutrophils Absolute: 3.7 10*3/uL (ref 1.4–7.0)
Neutrophils: 58 %
Platelets: 222 10*3/uL (ref 150–450)
RBC: 4 x10E6/uL (ref 3.77–5.28)
RDW: 13.1 % (ref 11.7–15.4)
WBC: 6.4 10*3/uL (ref 3.4–10.8)

## 2021-07-31 LAB — LIPID PANEL
Chol/HDL Ratio: 3.2 ratio (ref 0.0–4.4)
Cholesterol, Total: 149 mg/dL (ref 100–199)
HDL: 47 mg/dL (ref >39)
LDL Chol Calc (NIH): 71 mg/dL (ref 0–99)
Triglycerides: 185 mg/dL — ABNORMAL HIGH (ref 0–149)
VLDL Cholesterol Cal: 31 mg/dL (ref 5–40)

## 2021-07-31 NOTE — Therapy (Addendum)
[facility] [place], [place], [address] Phone: [phone]   Fax:  [phone]  Physical Therapy Treatment  Patient Details  Name: Misty Smith MRN: [id number] Date of Birth: 1940/09/24 Referring Provider (PT): Darla Lesches   Encounter Date: 07/31/2021   PT End of Session - 07/31/21 0819     Visit Number 12    Number of Visits 12    Date for PT Re-Evaluation 09/16/21    Authorization Type FOTO AT LEAST EVERY 5TH VISIT. (10th visit 36)  PROGRESS NOTE AT 10TH VISIT.  KX MODIFIER AFTER 15 VISITS.    PT Start Time 0815    PT Stop Time 0903    PT Time Calculation (min) 48 min    Activity Tolerance Patient tolerated treatment well    Behavior During Therapy WFL for tasks assessed/performed             Past Medical History:  Diagnosis Date   Allergy    Anxiety    Arthritis    Bronchitis    Cataract    Colovaginal fistula    Depression    Diabetes mellitus without complication (HCC)    Dysrhythmia    palpitations, occasional PVCs; Dr Stanford Breed cardiologist   Esophageal stricture    Fractured elbow 1970's   GERD (gastroesophageal reflux disease)    H/O hiatal hernia    Hyperlipidemia    Hypertension    Osteopenia    Perimenopausal vasomotor symptoms    Postmenopausal HRT (hormone replacement therapy)    Pre-diabetes    Stress    Subacute lupus erythematosus    Dr. Allyson Sabal    Past Surgical History:  Procedure Laterality Date   abdominal bypass  1987   fibroids    ABDOMINAL HYSTERECTOMY     partial   bilateral tubal ligtation  1972   CHOLECYSTECTOMY N/A 03/21/2014   Procedure: LAPAROSCOPIC CHOLECYSTECTOMY WITH INTRAOPERATIVE CHOLANGIOGRAM;  Surgeon: Shann Medal, MD;  Location: [facility];  Service: General;  Laterality: N/A;   COLONOSCOPY     elbow Right 1976   surgery after fracture of elbow   EYE SURGERY Bilateral    cataracts   ROTATOR CUFF REPAIR Right 03/29/2018   TONSILLECTOMY     TUBAL  LIGATION      There were no vitals filed for this visit.   Subjective Assessment - 07/31/21 0818     Subjective COVID-19 screen performed prior to patient entering clinic.  Pt arrives for today's treatment reporting 1/10 low back pain.    Pertinent History Left knee arthroscopic surgery, osteopenia, HTN, right RTC repair.    How long can you sit comfortably? Not a problem.    How long can you stand comfortably? Less than 10 minutes.    How long can you walk comfortably? Short community distances with a cane.    Patient Stated Goals Be able to do more with less pain.    Currently in Pain? Yes    Pain Score 1     Pain Location Back                               [facility] Adult PT Treatment/Exercise - 07/31/21 0001       Lumbar Exercises: Aerobic   Nustep Lvl 4 x 15 mins      Modalities   Modalities Electrical Stimulation      Moist Heat Therapy   Number Minutes Moist Heat 15  Minutes    Moist Heat Location Lumbar Spine      Electrical Stimulation   Electrical Stimulation Location Bilateral low back    Electrical Stimulation Action IFC at 80-150 Hz    Electrical Stimulation Parameters 40% scan x 15 mins    Electrical Stimulation Goals Tone;Pain      Manual Therapy   Manual Therapy Soft tissue mobilization    Soft tissue mobilization STW/M to B lumbar paraspinals and upper glutes                          PT Long Term Goals - 07/31/21 0848       PT LONG TERM GOAL #1   Title Independent with a HEP.    Time 6    Period Weeks    Status Achieved      PT LONG TERM GOAL #2   Title Stand 15 minutes with pain not > 3-4/10.    Baseline Pt reported 4-5/10 07/23/21    Time 6    Period Weeks    Status Achieved      PT LONG TERM GOAL #3   Title Perform houshold activities for 30 minutes without stopping to sit.    Baseline only able to perform 10 min at a time 07/23/21; 15 mins at a time 11/30    Time 6    Period Weeks    Status On-going       PT LONG TERM GOAL #4   Title Walk a community distance with pain not > 4/10.    Baseline pt reported 4-5/10 07/23/21    Time 6    Period Weeks    Status Achieved                   Plan - 07/31/21 0820     Clinical Impression Statement Pt arrives for today's treatment session reporting 1/10 low back pain.  Pt states that she is performing her stretches at home and feels that they are helping.  Pt has met all of her goals at this except being able to perform household for 30 mins at a time.  Pt states that she is ableto perform ~15 mins for household chores before requiring a seated rest break.  Pt feels that she is prepared for discharge at this time.  All questions encouraged and answered.  Pt denied any pain any pain at the completion of today's treatment session.    Personal Factors and Comorbidities Comorbidity 1;Comorbidity 2;Other    Comorbidities Left knee arthroscopic surgery, osteopenia, HTN, right RTC repair.    Examination-Activity Limitations Locomotion Level;Other;Stand    Examination-Participation Restrictions Other;Meal Prep    Stability/Clinical Decision Making Evolving/Moderate complexity    Rehab Potential Good    PT Frequency 2x / week    PT Duration 6 weeks    PT Treatment/Interventions ADLs/Self Care Home Management;Cryotherapy;Electrical Stimulation;Ultrasound;Moist Heat;Functional mobility training;Therapeutic activities;Therapeutic exercise;Manual techniques;Patient/family education;Passive range of motion;Dry needling    PT Next Visit Plan Combo e'stim/US, STW/M, right SKTC and gentle right hip stretching, postural exercises, core exercise progression.    Consulted and Agree with Plan of Care Patient             Patient will benefit from skilled therapeutic intervention in order to improve the following deficits and impairments:  Abnormal gait, Pain, Decreased activity tolerance, Decreased mobility, Decreased range of motion, Postural dysfunction,  Increased muscle spasms  Visit Diagnosis: Chronic bilateral low back pain with bilateral  sciatica  Abnormal posture     Problem List Patient Active Problem List   Diagnosis Date Noted   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   Upper airway cough syndrome 02/08/2021   Diabetes mellitus without complication ([place]) 53/61/4431   Pseudophakia of both eyes 01/30/2021   Lumbar radiculopathy 01/11/2021   Choroidal nevus, 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   Tear of medial meniscus of knee 12/05/2019   Pain in left knee 09/21/2019   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   Tear of right rotator cuff 01/12/2018   Impingement syndrome of shoulder region 09/26/2017   Coronary artery disease due to lipid rich plaque 03/18/2016   Anxiety state 03/18/2016   Vitamin D deficiency 03/18/2016   Gall bladder disease 03/02/2014   CAD (coronary artery disease) 12/30/2013   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   Osteopenia of the elderly 10/26/2013   Generalized anxiety disorder 09/21/2013   GERD (gastroesophageal reflux disease) 09/21/2013   Multiple pulmonary nodules determined by computed tomography of lung 09/21/2013   SYNCOPE 02/02/2009   Pre-diabetes 02/01/2009   Hyperlipidemia 02/01/2009   Essential hypertension 02/01/2009   DIZZINESS 02/01/2009    Kathrynn Ducking, PTA 07/31/2021, 9:09 AM  [facility] [address] [place], [place], [address] Phone: [phone]   Fax:  [phone]  Name: Misty Smith MRN: [id number] Date of Birth: 1940-10-29   PHYSICAL THERAPY DISCHARGE SUMMARY  Visits from Start of Care: 12.  Current functional level related to goals / functional outcomes: See above.   Remaining deficits: All but LTG #3 met.   Education / Equipment: HEP.   Patient agrees to discharge. Patient goals were partially met. Patient is being discharged due to being pleased with the current functional level.    Mali Applegate  MPT

## 2021-08-01 ENCOUNTER — Ambulatory Visit (INDEPENDENT_AMBULATORY_CARE_PROVIDER_SITE_OTHER): Payer: Medicare Other | Admitting: Family Medicine

## 2021-08-01 ENCOUNTER — Encounter: Payer: Self-pay | Admitting: Family Medicine

## 2021-08-01 VITALS — BP 125/55 | HR 60 | Ht 60.0 in | Wt 173.0 lb

## 2021-08-01 DIAGNOSIS — I2510 Atherosclerotic heart disease of native coronary artery without angina pectoris: Secondary | ICD-10-CM

## 2021-08-01 DIAGNOSIS — E7800 Pure hypercholesterolemia, unspecified: Secondary | ICD-10-CM | POA: Diagnosis not present

## 2021-08-01 DIAGNOSIS — K219 Gastro-esophageal reflux disease without esophagitis: Secondary | ICD-10-CM | POA: Diagnosis not present

## 2021-08-01 DIAGNOSIS — E1169 Type 2 diabetes mellitus with other specified complication: Secondary | ICD-10-CM | POA: Diagnosis not present

## 2021-08-01 DIAGNOSIS — I10 Essential (primary) hypertension: Secondary | ICD-10-CM | POA: Diagnosis not present

## 2021-08-01 NOTE — Progress Notes (Signed)
BP (!) 125/55   Pulse 60   Ht 5' (1.524 m)   Wt 173 lb (78.5 kg)   SpO2 95%   BMI 33.79 kg/m    Subjective:   Patient ID: Misty Smith, female    DOB: 1941-04-29, 80 y.o.   MRN: [id number]  HPI: Misty Smith is a 80 y.o. female presenting on 08/01/2021 for Medical Management of Chronic Issues and Prediabetes   HPI Hypertension Patient is currently on amlodipine and hydrochlorothiazide and olmesartan and metoprolol, and their blood pressure today is 125/55. Patient denies any lightheadedness or dizziness. Patient denies headaches, blurred vision, chest pains, shortness of breath, or weakness. Denies any side effects from medication and is content with current medication.   Hyperlipidemia Patient is coming in for recheck of his hyperlipidemia. The patient is currently taking atorvastatin. They deny any issues with myalgias or history of liver damage from it. They deny any focal numbness or weakness or chest pain.   Type 2 diabetes mellitus Patient comes in today for recheck of his diabetes. Patient has been currently taking Actos, A1c looks good at 6.3. Patient is currently on an ACE inhibitor/ARB. Patient has seen an ophthalmologist this year. Patient denies any issues with their feet. The symptom started onset as an adult hypertension and hyperlipidemia and GERD ARE RELATED TO DM   GERD Patient is currently on Nexium.  She denies any major symptoms or abdominal pain or belching or burping. She denies any blood in her stool or lightheadedness or dizziness.   Relevant past medical, surgical, family and social history reviewed and updated as indicated. Interim medical history since our last visit reviewed. Allergies and medications reviewed and updated.  Review of Systems  Constitutional:  Negative for chills and fever.  Eyes:  Negative for visual disturbance.  Respiratory:  Negative for chest tightness and shortness of breath.   Cardiovascular:  Negative for chest pain and  leg swelling.  Musculoskeletal:  Negative for back pain and gait problem.  Skin:  Negative for rash.  Neurological:  Negative for dizziness, light-headedness and headaches.  Psychiatric/Behavioral:  Negative for agitation and behavioral problems.   All other systems reviewed and are negative.  Per HPI unless specifically indicated above   Allergies as of 08/01/2021       Reactions   Ciprofloxacin Rash   Codeine Rash   Iohexol     Desc: PT HAD HIVES AND WAS GIVEN BENEDRYL PO BY NURSE AND OBSERVED IN NURSES STATION AND RELEASED WITHOUT AND OTHER COMPLICATIONS SHEET SCANNED UNDER NURSES NOTES///ON 12/01/11 PT HAD ANOTHER CT WITH PREMEDS MINUS THE BENADRYL. SHE HAD A DELAYED REACTION AFTER WITH THROAT TIGHTNESS AND SOB, JB  12/07/12   Penicillins Hives   Sulfonamide Derivatives Hives   Asa [aspirin] Other (See Comments)   Large amounts causes stomach pain   Keflex [cephalexin] Nausea And Vomiting   Morphine And Related Rash        Medication List        Accurate as of August 01, 2021  9:37 AM. If you have any questions, ask your nurse or doctor.          amLODipine 5 MG tablet Commonly known as: NORVASC Take 5 mg by mouth daily.   aspirin EC 81 MG tablet Take 1 tablet (81 mg total) by mouth daily.   atorvastatin 80 MG tablet Commonly known as: LIPITOR Take 1 tablet (80 mg total) by mouth at bedtime.   betamethasone dipropionate 0.05 % cream Apply  topically.   Calcium Carb-Cholecalciferol 600-800 MG-UNIT Tabs Take 1 tablet by mouth every evening.   cholecalciferol 1000 units tablet Commonly known as: VITAMIN D Take 2,000 Units by mouth daily.   Clinpro 5000 1.1 % Pste Generic drug: Sodium Fluoride Place onto teeth 2 (two) times daily.   escitalopram 20 MG tablet Commonly known as: LEXAPRO Take 1 tablet (20 mg total) by mouth daily.   esomeprazole 40 MG capsule Commonly known as: NEXIUM Take 1 capsule (40 mg total) by mouth daily.   estradiol 0.1 MG/GM  vaginal cream Commonly known as: ESTRACE Place vaginally.   hydrochlorothiazide 12.5 MG tablet Commonly known as: HYDRODIURIL Take 1 tablet (12.5 mg total) by mouth daily.   hydrocortisone 2.5 % cream PLEASE SEE ATTACHED FOR DETAILED DIRECTIONS   irbesartan 150 MG tablet Commonly known as: AVAPRO TAKE 1 TABLET BY MOUTH EVERY DAY What changed: how much to take   ketoconazole 2 % cream Commonly known as: NIZORAL Apply topically 2 (two) times daily as needed.   metoprolol tartrate 25 MG tablet Commonly known as: LOPRESSOR Take 0.5 tablets (12.5 mg total) by mouth 2 (two) times daily.   multivitamin tablet Take 1 tablet by mouth every morning.   omega-3 acid ethyl esters 1 g capsule Commonly known as: LOVAZA Take 2 capsules (2 g total) by mouth 2 (two) times daily.   pioglitazone 30 MG tablet Commonly known as: ACTOS Take 1 tablet (30 mg total) by mouth daily.         Objective:   BP (!) 125/55   Pulse 60   Ht 5' (1.524 m)   Wt 173 lb (78.5 kg)   SpO2 95%   BMI 33.79 kg/m   Wt Readings from Last 3 Encounters:  08/01/21 173 lb (78.5 kg)  06/14/21 175 lb (79.4 kg)  06/03/21 171 lb (77.6 kg)    Physical Exam Vitals and nursing note reviewed.  Constitutional:      General: She is not in acute distress.    Appearance: She is well-developed. She is not diaphoretic.  Eyes:     Conjunctiva/sclera: Conjunctivae normal.  Cardiovascular:     Rate and Rhythm: Normal rate and regular rhythm.     Heart sounds: Normal heart sounds. No murmur heard. Pulmonary:     Effort: Pulmonary effort is normal. No respiratory distress.     Breath sounds: Normal breath sounds. No wheezing.  Musculoskeletal:        General: No swelling or tenderness. Normal range of motion.  Skin:    General: Skin is warm and dry.     Findings: No rash.  Neurological:     Mental Status: She is alert and oriented to person, place, and time.     Coordination: Coordination normal.  Psychiatric:         Behavior: Behavior normal.    Results for orders placed or performed in visit on 07/30/21  CBC with Differential/Platelet  Result Value Ref Range   WBC 6.4 3.4 - 10.8 x10E3/uL   RBC 4.00 3.77 - 5.28 x10E6/uL   Hemoglobin 12.5 11.1 - 15.9 g/dL   Hematocrit 37.0 34.0 - 46.6 %   MCV 93 79 - 97 fL   MCH 31.3 26.6 - 33.0 pg   MCHC 33.8 31.5 - 35.7 g/dL   RDW 13.1 11.7 - 15.4 %   Platelets 222 150 - 450 x10E3/uL   Neutrophils 58 Not Estab. %   Lymphs 26 Not Estab. %   Monocytes 11 Not Estab. %  Eos 4 Not Estab. %   Basos 1 Not Estab. %   Neutrophils Absolute 3.7 1.4 - 7.0 x10E3/uL   Lymphocytes Absolute 1.7 0.7 - 3.1 x10E3/uL   Monocytes Absolute 0.7 0.1 - 0.9 x10E3/uL   EOS (ABSOLUTE) 0.3 0.0 - 0.4 x10E3/uL   Basophils Absolute 0.0 0.0 - 0.2 x10E3/uL   Immature Granulocytes 0 Not Estab. %   Immature Grans (Abs) 0.0 0.0 - 0.1 x10E3/uL  CMP14+EGFR  Result Value Ref Range   Glucose 111 (H) 70 - 99 mg/dL   BUN 15 8 - 27 mg/dL   Creatinine, Ser 0.80 0.57 - 1.00 mg/dL   eGFR 74 >59 mL/min/1.73   BUN/Creatinine Ratio 19 12 - 28   Sodium 139 134 - 144 mmol/L   Potassium 4.8 3.5 - 5.2 mmol/L   Chloride 98 96 - 106 mmol/L   CO2 23 20 - 29 mmol/L   Calcium 10.1 8.7 - 10.3 mg/dL   Total Protein 6.7 6.0 - 8.5 g/dL   Albumin 4.2 3.6 - 4.6 g/dL   Globulin, Total 2.5 1.5 - 4.5 g/dL   Albumin/Globulin Ratio 1.7 1.2 - 2.2   Bilirubin Total 0.4 0.0 - 1.2 mg/dL   Alkaline Phosphatase 73 44 - 121 IU/L   AST 31 0 - 40 IU/L   ALT 23 0 - 32 IU/L  Lipid panel  Result Value Ref Range   Cholesterol, Total 149 100 - 199 mg/dL   Triglycerides 185 (H) 0 - 149 mg/dL   HDL 47 >39 mg/dL   VLDL Cholesterol Cal 31 5 - 40 mg/dL   LDL Chol Calc (NIH) 71 0 - 99 mg/dL   Chol/HDL Ratio 3.2 0.0 - 4.4 ratio  Bayer DCA Hb A1c Waived  Result Value Ref Range   HB A1C (BAYER DCA - WAIVED) 6.3 (H) 4.8 - 5.6 %    Assessment & Plan:   Problem List Items Addressed This Visit       Cardiovascular  and Mediastinum   Essential hypertension     Digestive   GERD (gastroesophageal reflux disease)     Endocrine   Type 2 diabetes mellitus with other specified complication ([place]) - Primary     Other   Hyperlipidemia    Blood work looks good except triglycerides are slightly elevated, A1c was 6.3, looks good.  No changes. Follow up plan: Return in about 4 months (around 11/30/2021), or if symptoms worsen or fail to improve, for Diabetes hypertension cholesterol.  Counseling provided for all of the vaccine components No orders of the defined types were placed in this encounter.   Caryl Pina, MD [facility] 08/01/2021, 9:37 AM

## 2021-08-08 DIAGNOSIS — H04123 Dry eye syndrome of bilateral lacrimal glands: Secondary | ICD-10-CM | POA: Diagnosis not present

## 2021-08-08 DIAGNOSIS — Z961 Presence of intraocular lens: Secondary | ICD-10-CM | POA: Diagnosis not present

## 2021-08-08 DIAGNOSIS — H353131 Nonexudative age-related macular degeneration, bilateral, early dry stage: Secondary | ICD-10-CM | POA: Diagnosis not present

## 2021-08-08 DIAGNOSIS — D3131 Benign neoplasm of right choroid: Secondary | ICD-10-CM | POA: Diagnosis not present

## 2021-08-08 DIAGNOSIS — D3132 Benign neoplasm of left choroid: Secondary | ICD-10-CM | POA: Diagnosis not present

## 2021-08-20 DIAGNOSIS — M9901 Segmental and somatic dysfunction of cervical region: Secondary | ICD-10-CM | POA: Diagnosis not present

## 2021-08-20 DIAGNOSIS — M9903 Segmental and somatic dysfunction of lumbar region: Secondary | ICD-10-CM | POA: Diagnosis not present

## 2021-08-20 DIAGNOSIS — M62830 Muscle spasm of back: Secondary | ICD-10-CM | POA: Diagnosis not present

## 2021-08-20 DIAGNOSIS — M9904 Segmental and somatic dysfunction of sacral region: Secondary | ICD-10-CM | POA: Diagnosis not present

## 2021-08-26 DIAGNOSIS — M9903 Segmental and somatic dysfunction of lumbar region: Secondary | ICD-10-CM | POA: Diagnosis not present

## 2021-08-26 DIAGNOSIS — M9904 Segmental and somatic dysfunction of sacral region: Secondary | ICD-10-CM | POA: Diagnosis not present

## 2021-08-26 DIAGNOSIS — M62830 Muscle spasm of back: Secondary | ICD-10-CM | POA: Diagnosis not present

## 2021-08-26 DIAGNOSIS — M9901 Segmental and somatic dysfunction of cervical region: Secondary | ICD-10-CM | POA: Diagnosis not present

## 2021-09-02 DIAGNOSIS — M9903 Segmental and somatic dysfunction of lumbar region: Secondary | ICD-10-CM | POA: Diagnosis not present

## 2021-09-02 DIAGNOSIS — M9904 Segmental and somatic dysfunction of sacral region: Secondary | ICD-10-CM | POA: Diagnosis not present

## 2021-09-02 DIAGNOSIS — M9901 Segmental and somatic dysfunction of cervical region: Secondary | ICD-10-CM | POA: Diagnosis not present

## 2021-09-02 DIAGNOSIS — M62830 Muscle spasm of back: Secondary | ICD-10-CM | POA: Diagnosis not present

## 2021-09-09 DIAGNOSIS — M9901 Segmental and somatic dysfunction of cervical region: Secondary | ICD-10-CM | POA: Diagnosis not present

## 2021-09-09 DIAGNOSIS — M62830 Muscle spasm of back: Secondary | ICD-10-CM | POA: Diagnosis not present

## 2021-09-09 DIAGNOSIS — M9904 Segmental and somatic dysfunction of sacral region: Secondary | ICD-10-CM | POA: Diagnosis not present

## 2021-09-09 DIAGNOSIS — M9903 Segmental and somatic dysfunction of lumbar region: Secondary | ICD-10-CM | POA: Diagnosis not present

## 2021-09-09 NOTE — Progress Notes (Signed)
HPI: FU palpitations and hypertension. Seen in the past secondary to hypertension, hyperlipidemia, and palpitations felt secondary to PVCs. She had a syncopal episode in April of 2010 that we felt was most likely vagal. An echocardiogram showed normal LV function. A Myoview showed an ejection fraction of 80% and normal perfusion. These were performed on February 20, 2009. Chest CT 4/15 showed coronary calcification. Since I last saw her, she denies increased dyspnea, exertional chest pain, palpitations or syncope.  Current Outpatient Medications  Medication Sig Dispense Refill   amLODipine (NORVASC) 5 MG tablet Take 5 mg by mouth daily.     aspirin EC 81 MG tablet Take 1 tablet (81 mg total) by mouth daily. 90 tablet 3   atorvastatin (LIPITOR) 80 MG tablet Take 1 tablet (80 mg total) by mouth at bedtime. 90 tablet 3   betamethasone dipropionate 0.05 % cream Apply topically.     Calcium Carb-Cholecalciferol 600-800 MG-UNIT TABS Take 1 tablet by mouth every evening.      cholecalciferol (VITAMIN D) 1000 UNITS tablet Take 2,000 Units by mouth daily.     CLINPRO 5000 1.1 % PSTE Place onto teeth 2 (two) times daily.     escitalopram (LEXAPRO) 20 MG tablet Take 1 tablet (20 mg total) by mouth daily. 90 tablet 3   esomeprazole (NEXIUM) 40 MG capsule Take 1 capsule (40 mg total) by mouth daily. 90 capsule 3   estradiol (ESTRACE) 0.1 MG/GM vaginal cream Place vaginally.     hydrochlorothiazide (HYDRODIURIL) 12.5 MG tablet Take 1 tablet (12.5 mg total) by mouth daily. 90 tablet 3   hydrocortisone 2.5 % cream PLEASE SEE ATTACHED FOR DETAILED DIRECTIONS     irbesartan (AVAPRO) 150 MG tablet TAKE 1 TABLET BY MOUTH EVERY DAY (Patient taking differently: Take 75 mg by mouth daily.) 90 tablet 0   ketoconazole (NIZORAL) 2 % cream Apply topically 2 (two) times daily as needed.     metoprolol tartrate (LOPRESSOR) 25 MG tablet Take 0.5 tablets (12.5 mg total) by mouth 2 (two) times daily. 90 tablet 3   Multiple  Vitamin (MULTIVITAMIN) tablet Take 1 tablet by mouth every morning.      omega-3 acid ethyl esters (LOVAZA) 1 g capsule Take 2 capsules (2 g total) by mouth 2 (two) times daily. 360 capsule 3   pioglitazone (ACTOS) 30 MG tablet Take 1 tablet (30 mg total) by mouth daily. 90 tablet 3   No current facility-administered medications for this visit.     Past Medical History:  Diagnosis Date   Allergy    Anxiety    Arthritis    Bronchitis    Cataract    Colovaginal fistula    Depression    Diabetes mellitus without complication (HCC)    Dysrhythmia    palpitations, occasional PVCs; Dr Stanford Breed cardiologist   Esophageal stricture    Fractured elbow 1970's   GERD (gastroesophageal reflux disease)    H/O hiatal hernia    Hyperlipidemia    Hypertension    Osteopenia    Perimenopausal vasomotor symptoms    Postmenopausal HRT (hormone replacement therapy)    Pre-diabetes    Stress    Subacute lupus erythematosus    Dr. Allyson Sabal    Past Surgical History:  Procedure Laterality Date   abdominal bypass  1987   fibroids    ABDOMINAL HYSTERECTOMY     partial   bilateral tubal ligtation  1972   CHOLECYSTECTOMY N/A 03/21/2014   Procedure: LAPAROSCOPIC CHOLECYSTECTOMY WITH INTRAOPERATIVE  CHOLANGIOGRAM;  Surgeon: Shann Medal, MD;  Location: [facility];  Service: General;  Laterality: N/A;   COLONOSCOPY     elbow Right 1976   surgery after fracture of elbow   EYE SURGERY Bilateral    cataracts   ROTATOR CUFF REPAIR Right 03/29/2018   TONSILLECTOMY     TUBAL LIGATION      Social History   Socioeconomic History   Marital status: Widowed    Spouse name: Not on file   Number of children: 4   Years of education: Not on file   Highest education level: Not on file  Occupational History   Occupation: retired     Comment: [employer]   Tobacco Use   Smoking status: Never   Smokeless tobacco: Never  Vaping Use   Vaping Use: Never used  Substance and Sexual Activity   Alcohol  use: No    Alcohol/week: 0.0 standard drinks   Drug use: No   Sexual activity: Never  Other Topics Concern   Not on file  Social History Narrative   Lives alone - son next door    Daughters in [place], one in [place], one in [place]   Social Determinants of Health   Financial Resource Strain: Low Risk    Difficulty of Paying Living Expenses: Not hard at all  Food Insecurity: No Food Insecurity   Worried About Charity fundraiser in the Last Year: Never true   Arboriculturist in the Last Year: Never true  Transportation Needs: No Transportation Needs   Lack of Transportation (Medical): No   Lack of Transportation (Non-Medical): No  Physical Activity: Inactive   Days of Exercise per Week: 0 days   Minutes of Exercise per Session: 0 min  Stress: No Stress Concern Present   Feeling of Stress : Only a little  Social Connections: Moderately Integrated   Frequency of Communication with Friends and Family: More than three times a week   Frequency of Social Gatherings with Friends and Family: More than three times a week   Attends Religious Services: More than 4 times per year   Active Member of [employer] or Organizations: Yes   Attends Archivist Meetings: 1 to 4 times per year   Marital Status: Widowed  Human resources officer Violence: Not At Risk   Fear of Current or Ex-Partner: No   Emotionally Abused: No   Physically Abused: No   Sexually Abused: No    Family History  Problem Relation Age of Onset   Cancer Mother        laryngeal    Hypertension Mother    Heart disease Mother    Hypertension Father    Coronary artery disease Father    Kidney disease Father        kidney failure    Hypertension Sister    Cancer Sister        breast   Heart attack Sister    Heart disease Sister    Stroke Sister    Stroke Sister    Hypertension Brother    Cancer Brother        liver   Hypertension Sister    Hyperlipidemia Sister    Hypertension Brother    Heart attack Brother     Hypertension Son    Hypertension Daughter     ROS: Back pain but no fevers or chills, productive cough, hemoptysis, dysphasia, odynophagia, melena, hematochezia, dysuria, hematuria, rash, seizure activity, orthopnea, PND, pedal edema, claudication.  Remaining systems are negative.  Physical Exam: Well-developed well-nourished in no acute distress.  Skin is warm and dry.  HEENT is normal.  Neck is supple.  Chest is clear to auscultation with normal expansion.  Cardiovascular exam is regular rate and rhythm.  Abdominal exam nontender or distended. No masses palpated. Extremities show no edema. neuro grossly intact  ECG-sinus bradycardia with occasional PACs, no ST changes.  Personally reviewed  A/P  1 coronary artery disease-this is based on previous CT scan showing coronary calcification.  She continues to deny chest pain.  Plan to continue medical therapy with aspirin and statin.  2 hypertension-blood pressure controlled.  Continue present medications.  3 hyperlipidemia-continue statin.  4 palpitations-symptoms continue to be controlled.  We will continue low-dose metoprolol.  Kirk Ruths, MD

## 2021-09-11 DIAGNOSIS — M179 Osteoarthritis of knee, unspecified: Secondary | ICD-10-CM | POA: Insufficient documentation

## 2021-09-12 DIAGNOSIS — D485 Neoplasm of uncertain behavior of skin: Secondary | ICD-10-CM | POA: Diagnosis not present

## 2021-09-12 DIAGNOSIS — L280 Lichen simplex chronicus: Secondary | ICD-10-CM | POA: Diagnosis not present

## 2021-09-12 DIAGNOSIS — L298 Other pruritus: Secondary | ICD-10-CM | POA: Diagnosis not present

## 2021-09-12 DIAGNOSIS — L538 Other specified erythematous conditions: Secondary | ICD-10-CM | POA: Diagnosis not present

## 2021-09-12 DIAGNOSIS — L718 Other rosacea: Secondary | ICD-10-CM | POA: Diagnosis not present

## 2021-09-12 DIAGNOSIS — L281 Prurigo nodularis: Secondary | ICD-10-CM | POA: Diagnosis not present

## 2021-09-12 DIAGNOSIS — L304 Erythema intertrigo: Secondary | ICD-10-CM | POA: Diagnosis not present

## 2021-09-16 DIAGNOSIS — M9904 Segmental and somatic dysfunction of sacral region: Secondary | ICD-10-CM | POA: Diagnosis not present

## 2021-09-16 DIAGNOSIS — M9903 Segmental and somatic dysfunction of lumbar region: Secondary | ICD-10-CM | POA: Diagnosis not present

## 2021-09-16 DIAGNOSIS — M62830 Muscle spasm of back: Secondary | ICD-10-CM | POA: Diagnosis not present

## 2021-09-16 DIAGNOSIS — M9901 Segmental and somatic dysfunction of cervical region: Secondary | ICD-10-CM | POA: Diagnosis not present

## 2021-09-19 ENCOUNTER — Ambulatory Visit (INDEPENDENT_AMBULATORY_CARE_PROVIDER_SITE_OTHER): Payer: Medicare Other | Admitting: Cardiology

## 2021-09-19 ENCOUNTER — Encounter: Payer: Self-pay | Admitting: Cardiology

## 2021-09-19 ENCOUNTER — Other Ambulatory Visit: Payer: Self-pay

## 2021-09-19 VITALS — BP 140/80 | HR 55 | Ht 60.0 in | Wt 170.6 lb

## 2021-09-19 DIAGNOSIS — I2510 Atherosclerotic heart disease of native coronary artery without angina pectoris: Secondary | ICD-10-CM | POA: Diagnosis not present

## 2021-09-19 DIAGNOSIS — I10 Essential (primary) hypertension: Secondary | ICD-10-CM | POA: Diagnosis not present

## 2021-09-19 DIAGNOSIS — R002 Palpitations: Secondary | ICD-10-CM

## 2021-09-19 DIAGNOSIS — E782 Mixed hyperlipidemia: Secondary | ICD-10-CM

## 2021-09-19 MED ORDER — ATORVASTATIN CALCIUM 40 MG PO TABS: 40.0000 mg | ORAL_TABLET | Freq: Every day | ORAL | 3 refills | Status: DC

## 2021-09-19 MED ORDER — IRBESARTAN 75 MG PO TABS: 75.0000 mg | ORAL_TABLET | Freq: Every day | ORAL | 3 refills | Status: DC

## 2021-09-19 NOTE — Patient Instructions (Signed)

## 2021-09-23 DIAGNOSIS — M9901 Segmental and somatic dysfunction of cervical region: Secondary | ICD-10-CM | POA: Diagnosis not present

## 2021-09-23 DIAGNOSIS — M9904 Segmental and somatic dysfunction of sacral region: Secondary | ICD-10-CM | POA: Diagnosis not present

## 2021-09-23 DIAGNOSIS — M9903 Segmental and somatic dysfunction of lumbar region: Secondary | ICD-10-CM | POA: Diagnosis not present

## 2021-09-23 DIAGNOSIS — M62830 Muscle spasm of back: Secondary | ICD-10-CM | POA: Diagnosis not present

## 2021-09-30 DIAGNOSIS — M9904 Segmental and somatic dysfunction of sacral region: Secondary | ICD-10-CM | POA: Diagnosis not present

## 2021-09-30 DIAGNOSIS — M62830 Muscle spasm of back: Secondary | ICD-10-CM | POA: Diagnosis not present

## 2021-09-30 DIAGNOSIS — M9901 Segmental and somatic dysfunction of cervical region: Secondary | ICD-10-CM | POA: Diagnosis not present

## 2021-09-30 DIAGNOSIS — M9903 Segmental and somatic dysfunction of lumbar region: Secondary | ICD-10-CM | POA: Diagnosis not present

## 2021-10-01 ENCOUNTER — Ambulatory Visit: Payer: Medicare Other

## 2021-10-04 ENCOUNTER — Ambulatory Visit: Payer: Medicare Other

## 2021-10-06 ENCOUNTER — Other Ambulatory Visit: Payer: Self-pay | Admitting: Family Medicine

## 2021-10-07 DIAGNOSIS — M9904 Segmental and somatic dysfunction of sacral region: Secondary | ICD-10-CM | POA: Diagnosis not present

## 2021-10-07 DIAGNOSIS — M62830 Muscle spasm of back: Secondary | ICD-10-CM | POA: Diagnosis not present

## 2021-10-07 DIAGNOSIS — M9903 Segmental and somatic dysfunction of lumbar region: Secondary | ICD-10-CM | POA: Diagnosis not present

## 2021-10-07 DIAGNOSIS — M9901 Segmental and somatic dysfunction of cervical region: Secondary | ICD-10-CM | POA: Diagnosis not present

## 2021-10-08 DIAGNOSIS — E1142 Type 2 diabetes mellitus with diabetic polyneuropathy: Secondary | ICD-10-CM | POA: Diagnosis not present

## 2021-10-08 DIAGNOSIS — M79676 Pain in unspecified toe(s): Secondary | ICD-10-CM | POA: Diagnosis not present

## 2021-10-08 DIAGNOSIS — L84 Corns and callosities: Secondary | ICD-10-CM | POA: Diagnosis not present

## 2021-10-08 DIAGNOSIS — B351 Tinea unguium: Secondary | ICD-10-CM | POA: Diagnosis not present

## 2021-10-13 ENCOUNTER — Other Ambulatory Visit: Payer: Self-pay | Admitting: Family Medicine

## 2021-10-14 ENCOUNTER — Other Ambulatory Visit: Payer: Self-pay | Admitting: Family Medicine

## 2021-10-14 DIAGNOSIS — M9904 Segmental and somatic dysfunction of sacral region: Secondary | ICD-10-CM | POA: Diagnosis not present

## 2021-10-14 DIAGNOSIS — M9903 Segmental and somatic dysfunction of lumbar region: Secondary | ICD-10-CM | POA: Diagnosis not present

## 2021-10-14 DIAGNOSIS — M9901 Segmental and somatic dysfunction of cervical region: Secondary | ICD-10-CM | POA: Diagnosis not present

## 2021-10-14 DIAGNOSIS — M62830 Muscle spasm of back: Secondary | ICD-10-CM | POA: Diagnosis not present

## 2021-10-14 NOTE — Progress Notes (Signed)
Thanks new prescription for half a tablet

## 2021-10-14 NOTE — Telephone Encounter (Signed)
After consulting cardiology note it looks like he says to keep it at 1/2 tablet and we will notify the patient that the prescription was may be picked up at a whole tablet but actually take a half a tablet. Sent new prescription for the half a tablet for

## 2021-10-14 NOTE — Telephone Encounter (Signed)
Talked with pt about her Metoprolol 25 mg prescription she told me that is takes 1/2 in the morning and 1/2 in the evening, she is taking it exactly how she is suppose to. She also asked about needing a refill on her Escitalopram, informed her that Dr. Warrick Parisian [facility] and [facility], she will check with them.

## 2021-10-21 DIAGNOSIS — M9904 Segmental and somatic dysfunction of sacral region: Secondary | ICD-10-CM | POA: Diagnosis not present

## 2021-10-21 DIAGNOSIS — M62830 Muscle spasm of back: Secondary | ICD-10-CM | POA: Diagnosis not present

## 2021-10-21 DIAGNOSIS — M9903 Segmental and somatic dysfunction of lumbar region: Secondary | ICD-10-CM | POA: Diagnosis not present

## 2021-10-21 DIAGNOSIS — M9901 Segmental and somatic dysfunction of cervical region: Secondary | ICD-10-CM | POA: Diagnosis not present

## 2021-10-28 ENCOUNTER — Encounter: Payer: Self-pay | Admitting: Family Medicine

## 2021-10-28 DIAGNOSIS — M9904 Segmental and somatic dysfunction of sacral region: Secondary | ICD-10-CM | POA: Diagnosis not present

## 2021-10-28 DIAGNOSIS — M9901 Segmental and somatic dysfunction of cervical region: Secondary | ICD-10-CM | POA: Diagnosis not present

## 2021-10-28 DIAGNOSIS — M62830 Muscle spasm of back: Secondary | ICD-10-CM | POA: Diagnosis not present

## 2021-10-28 DIAGNOSIS — M9903 Segmental and somatic dysfunction of lumbar region: Secondary | ICD-10-CM | POA: Diagnosis not present

## 2021-11-04 DIAGNOSIS — M9901 Segmental and somatic dysfunction of cervical region: Secondary | ICD-10-CM | POA: Diagnosis not present

## 2021-11-04 DIAGNOSIS — M9904 Segmental and somatic dysfunction of sacral region: Secondary | ICD-10-CM | POA: Diagnosis not present

## 2021-11-04 DIAGNOSIS — M9903 Segmental and somatic dysfunction of lumbar region: Secondary | ICD-10-CM | POA: Diagnosis not present

## 2021-11-04 DIAGNOSIS — M62830 Muscle spasm of back: Secondary | ICD-10-CM | POA: Diagnosis not present

## 2021-11-07 ENCOUNTER — Other Ambulatory Visit: Payer: Self-pay | Admitting: Family Medicine

## 2021-11-11 DIAGNOSIS — M62830 Muscle spasm of back: Secondary | ICD-10-CM | POA: Diagnosis not present

## 2021-11-11 DIAGNOSIS — M9904 Segmental and somatic dysfunction of sacral region: Secondary | ICD-10-CM | POA: Diagnosis not present

## 2021-11-11 DIAGNOSIS — M9901 Segmental and somatic dysfunction of cervical region: Secondary | ICD-10-CM | POA: Diagnosis not present

## 2021-11-11 DIAGNOSIS — M9903 Segmental and somatic dysfunction of lumbar region: Secondary | ICD-10-CM | POA: Diagnosis not present

## 2021-11-13 ENCOUNTER — Other Ambulatory Visit: Payer: Self-pay | Admitting: Cardiology

## 2021-11-14 DIAGNOSIS — M1712 Unilateral primary osteoarthritis, left knee: Secondary | ICD-10-CM | POA: Diagnosis not present

## 2021-11-18 ENCOUNTER — Other Ambulatory Visit: Payer: Self-pay | Admitting: Family Medicine

## 2021-11-18 DIAGNOSIS — M62830 Muscle spasm of back: Secondary | ICD-10-CM | POA: Diagnosis not present

## 2021-11-18 DIAGNOSIS — M9901 Segmental and somatic dysfunction of cervical region: Secondary | ICD-10-CM | POA: Diagnosis not present

## 2021-11-18 DIAGNOSIS — M9904 Segmental and somatic dysfunction of sacral region: Secondary | ICD-10-CM | POA: Diagnosis not present

## 2021-11-18 DIAGNOSIS — M9903 Segmental and somatic dysfunction of lumbar region: Secondary | ICD-10-CM | POA: Diagnosis not present

## 2021-11-20 ENCOUNTER — Telehealth: Payer: Self-pay | Admitting: Cardiology

## 2021-11-20 MED ORDER — AMLODIPINE BESYLATE 5 MG PO TABS: 5.0000 mg | ORAL_TABLET | Freq: Every day | ORAL | 2 refills | Status: DC

## 2021-11-20 NOTE — Telephone Encounter (Signed)
?*  STAT* If patient is at the pharmacy, call can be transferred to refill team. ? ? ?1. Which medications need to be refilled? (please list name of each medication and dose if known) amLODipine (NORVASC) 10 MG tablet ? ?2. Which pharmacy/location (including street and city if local pharmacy) is medication to be sent to?  [id number] [place], [place] - [address]? ?3. Do they need a 30 day or 90 day supply? 90 day ? ?Patient only has two pills left  ?

## 2021-11-20 NOTE — Telephone Encounter (Signed)
Medication has been filled, patient made aware. Gave a verbal understanding ?

## 2021-11-21 DIAGNOSIS — M1712 Unilateral primary osteoarthritis, left knee: Secondary | ICD-10-CM | POA: Diagnosis not present

## 2021-11-21 DIAGNOSIS — M25561 Pain in right knee: Secondary | ICD-10-CM | POA: Diagnosis not present

## 2021-11-25 DIAGNOSIS — M9901 Segmental and somatic dysfunction of cervical region: Secondary | ICD-10-CM | POA: Diagnosis not present

## 2021-11-25 DIAGNOSIS — M9904 Segmental and somatic dysfunction of sacral region: Secondary | ICD-10-CM | POA: Diagnosis not present

## 2021-11-25 DIAGNOSIS — M62830 Muscle spasm of back: Secondary | ICD-10-CM | POA: Diagnosis not present

## 2021-11-25 DIAGNOSIS — M9903 Segmental and somatic dysfunction of lumbar region: Secondary | ICD-10-CM | POA: Diagnosis not present

## 2021-11-28 ENCOUNTER — Telehealth: Payer: Self-pay | Admitting: Family Medicine

## 2021-11-28 ENCOUNTER — Other Ambulatory Visit: Payer: Self-pay

## 2021-11-28 DIAGNOSIS — I10 Essential (primary) hypertension: Secondary | ICD-10-CM

## 2021-11-28 DIAGNOSIS — E1169 Type 2 diabetes mellitus with other specified complication: Secondary | ICD-10-CM

## 2021-11-28 DIAGNOSIS — E782 Mixed hyperlipidemia: Secondary | ICD-10-CM

## 2021-11-28 NOTE — Telephone Encounter (Signed)
Future orders placed 

## 2021-11-29 ENCOUNTER — Other Ambulatory Visit: Payer: Medicare Other

## 2021-11-29 DIAGNOSIS — E782 Mixed hyperlipidemia: Secondary | ICD-10-CM

## 2021-11-29 DIAGNOSIS — I10 Essential (primary) hypertension: Secondary | ICD-10-CM | POA: Diagnosis not present

## 2021-11-29 DIAGNOSIS — E1169 Type 2 diabetes mellitus with other specified complication: Secondary | ICD-10-CM | POA: Diagnosis not present

## 2021-11-29 LAB — BAYER DCA HB A1C WAIVED: HB A1C (BAYER DCA - WAIVED): 6 % — ABNORMAL HIGH (ref 4.8–5.6)

## 2021-11-30 LAB — LIPID PANEL
Chol/HDL Ratio: 2.9 ratio (ref 0.0–4.4)
Cholesterol, Total: 155 mg/dL (ref 100–199)
HDL: 53 mg/dL (ref >39)
LDL Chol Calc (NIH): 73 mg/dL (ref 0–99)
Triglycerides: 173 mg/dL — ABNORMAL HIGH (ref 0–149)
VLDL Cholesterol Cal: 29 mg/dL (ref 5–40)

## 2021-11-30 LAB — CMP14+EGFR
ALT: 23 IU/L (ref 0–32)
AST: 32 IU/L (ref 0–40)
Albumin/Globulin Ratio: 1.7 (ref 1.2–2.2)
Albumin: 4.3 g/dL (ref 3.6–4.6)
Alkaline Phosphatase: 84 IU/L (ref 44–121)
BUN/Creatinine Ratio: 15 (ref 12–28)
BUN: 11 mg/dL (ref 8–27)
Bilirubin Total: 0.4 mg/dL (ref 0.0–1.2)
CO2: 23 mmol/L (ref 20–29)
Calcium: 10.1 mg/dL (ref 8.7–10.3)
Chloride: 96 mmol/L (ref 96–106)
Creatinine, Ser: 0.73 mg/dL (ref 0.57–1.00)
Globulin, Total: 2.5 g/dL (ref 1.5–4.5)
Glucose: 112 mg/dL — ABNORMAL HIGH (ref 70–99)
Potassium: 4.8 mmol/L (ref 3.5–5.2)
Sodium: 136 mmol/L (ref 134–144)
Total Protein: 6.8 g/dL (ref 6.0–8.5)
eGFR: 82 mL/min/{1.73_m2} (ref >59)

## 2021-11-30 LAB — CBC WITH DIFFERENTIAL/PLATELET
Basophils Absolute: 0 10*3/uL (ref 0.0–0.2)
Basos: 1 %
EOS (ABSOLUTE): 0.1 10*3/uL (ref 0.0–0.4)
Eos: 2 %
Hematocrit: 37.8 % (ref 34.0–46.6)
Hemoglobin: 12.6 g/dL (ref 11.1–15.9)
Immature Grans (Abs): 0 10*3/uL (ref 0.0–0.1)
Immature Granulocytes: 0 %
Lymphocytes Absolute: 1.6 10*3/uL (ref 0.7–3.1)
Lymphs: 30 %
MCH: 31.3 pg (ref 26.6–33.0)
MCHC: 33.3 g/dL (ref 31.5–35.7)
MCV: 94 fL (ref 79–97)
Monocytes Absolute: 0.6 10*3/uL (ref 0.1–0.9)
Monocytes: 12 %
Neutrophils Absolute: 3 10*3/uL (ref 1.4–7.0)
Neutrophils: 55 %
Platelets: 226 10*3/uL (ref 150–450)
RBC: 4.02 x10E6/uL (ref 3.77–5.28)
RDW: 12.5 % (ref 11.7–15.4)
WBC: 5.4 10*3/uL (ref 3.4–10.8)

## 2021-12-04 ENCOUNTER — Encounter: Payer: Self-pay | Admitting: Family Medicine

## 2021-12-04 ENCOUNTER — Ambulatory Visit (INDEPENDENT_AMBULATORY_CARE_PROVIDER_SITE_OTHER): Payer: Medicare Other | Admitting: Family Medicine

## 2021-12-04 VITALS — BP 119/64 | HR 53 | Ht 60.0 in | Wt 170.0 lb

## 2021-12-04 DIAGNOSIS — I10 Essential (primary) hypertension: Secondary | ICD-10-CM

## 2021-12-04 DIAGNOSIS — E7800 Pure hypercholesterolemia, unspecified: Secondary | ICD-10-CM | POA: Diagnosis not present

## 2021-12-04 DIAGNOSIS — I2510 Atherosclerotic heart disease of native coronary artery without angina pectoris: Secondary | ICD-10-CM

## 2021-12-04 DIAGNOSIS — E1169 Type 2 diabetes mellitus with other specified complication: Secondary | ICD-10-CM

## 2021-12-04 MED ORDER — METOPROLOL TARTRATE 25 MG PO TABS: 12.5000 mg | ORAL_TABLET | Freq: Two times a day (BID) | ORAL | 0 refills | Status: DC

## 2021-12-04 MED ORDER — OMEGA-3-ACID ETHYL ESTERS 1 G PO CAPS: 2.0000 | ORAL_CAPSULE | Freq: Two times a day (BID) | ORAL | 0 refills | Status: DC

## 2021-12-04 NOTE — Progress Notes (Signed)
? ?BP 119/64   Pulse (!) 53   Ht 5' (1.524 m)   Wt 170 lb (77.1 kg)   SpO2 99%   BMI 33.20 kg/m?   ? ?Subjective:  ? ?Patient ID: Misty Smith, female    DOB: 09/29/1940, 81 y.o.   MRN: [id number] ? ?HPI: ?Misty Smith is a 81 y.o. female presenting on 12/04/2021 for Medical Management of Chronic Issues and Diabetes ? ? ?HPI ?Type 2 diabetes mellitus ?Patient comes in today for recheck of his diabetes. Patient has been currently taking Actos. Patient is currently on an ACE inhibitor/ARB. Patient has not seen an ophthalmologist this year. Patient denies any issues with their feet. The symptom started onset as an adult hypertension and hyperlipidemia ARE RELATED TO DM  ? ?Hypertension ?Patient is currently on amlodipine and hydrochlorothiazide and irbesartan and metoprolol, and their blood pressure today is 119/64. Patient denies any lightheadedness or dizziness. Patient denies headaches, blurred vision, chest pains, shortness of breath, or weakness. Denies any side effects from medication and is content with current medication.  Patient also sees Dr. Stanford Breed for cardiology who manages this ? ?Hyperlipidemia ?Patient is coming in for recheck of his hyperlipidemia. The patient is currently taking fish oil and atorvastatin. They deny any issues with myalgias or history of liver damage from it. They deny any focal numbness or weakness or chest pain.  ? ?Relevant past medical, surgical, family and social history reviewed and updated as indicated. Interim medical history since our last visit reviewed. ?Allergies and medications reviewed and updated. ? ?Review of Systems  ?Constitutional:  Negative for chills and fever.  ?HENT:  Negative for congestion, ear discharge and ear pain.   ?Eyes:  Negative for redness and visual disturbance.  ?Respiratory:  Negative for chest tightness and shortness of breath.   ?Cardiovascular:  Negative for chest pain and leg swelling.  ?Genitourinary:  Negative for difficulty urinating  and dysuria.  ?Musculoskeletal:  Negative for back pain and gait problem.  ?Skin:  Negative for rash.  ?Neurological:  Negative for light-headedness and headaches.  ?Psychiatric/Behavioral:  Negative for agitation and behavioral problems.   ?All other systems reviewed and are negative. ? ?Per HPI unless specifically indicated above ? ? ?Allergies as of 12/04/2021   ? ?   Reactions  ? Ciprofloxacin Rash  ? Codeine Rash  ? Iohexol   ?  Desc: PT HAD HIVES AND WAS GIVEN BENEDRYL PO BY NURSE AND OBSERVED IN NURSES STATION AND RELEASED WITHOUT AND OTHER COMPLICATIONS SHEET SCANNED UNDER NURSES NOTES///ON 12/01/11 PT HAD ANOTHER CT WITH PREMEDS MINUS THE BENADRYL. SHE HAD A DELAYED REACTION AFTER WITH THROAT TIGHTNESS AND SOB, JB  12/07/12  ? Penicillins Hives  ? Sulfonamide Derivatives Hives  ? Asa [aspirin] Other (See Comments)  ? Large amounts causes stomach pain  ? Keflex [cephalexin] Nausea And Vomiting  ? Morphine And Related Rash  ? ?  ? ?  ?Medication List  ?  ? ?  ? Accurate as of December 04, 2021  8:50 AM. If you have any questions, ask your nurse or doctor.  ?  ?  ? ?  ? ?amLODipine 10 MG tablet ?Commonly known as: NORVASC ?Take 10 mg by mouth daily. ?What changed: Another medication with the same name was removed. Continue taking this medication, and follow the directions you see here. ?Changed by: Worthy Rancher, MD ?  ?aspirin EC 81 MG tablet ?Take 1 tablet (81 mg total) by mouth daily. ?  ?atorvastatin 40  MG tablet ?Commonly known as: LIPITOR ?Take 1 tablet (40 mg total) by mouth at bedtime. ?  ?betamethasone dipropionate 0.05 % cream ?Apply topically. ?  ?Calcium Carb-Cholecalciferol 600-800 MG-UNIT Tabs ?Take 1 tablet by mouth every evening. ?  ?cholecalciferol 1000 units tablet ?Commonly known as: VITAMIN D ?Take 2,000 Units by mouth daily. ?  ?Clinpro 5000 1.1 % Pste ?Generic drug: Sodium Fluoride ?Place onto teeth 2 (two) times daily. ?  ?escitalopram 20 MG tablet ?Commonly known as: LEXAPRO ?Take 1 tablet  (20 mg total) by mouth daily. ?  ?esomeprazole 40 MG capsule ?Commonly known as: [place] ?Take 1 capsule (40 mg total) by mouth daily. ?  ?estradiol 0.1 MG/GM vaginal cream ?Commonly known as: ESTRACE ?Place vaginally. ?  ?hydrochlorothiazide 12.5 MG tablet ?Commonly known as: HYDRODIURIL ?Take 1 tablet (12.5 mg total) by mouth daily. ?  ?hydrocortisone 2.5 % cream ?PLEASE SEE ATTACHED FOR DETAILED DIRECTIONS ?  ?irbesartan 75 MG tablet ?Commonly known as: AVAPRO ?Take 1 tablet (75 mg total) by mouth daily. ?  ?ketoconazole 2 % cream ?Commonly known as: NIZORAL ?Apply topically 2 (two) times daily as needed. ?  ?metoprolol tartrate 25 MG tablet ?Commonly known as: LOPRESSOR ?Take 0.5 tablets (12.5 mg total) by mouth 2 (two) times daily. ?  ?multivitamin tablet ?Take 1 tablet by mouth every morning. ?  ?omega-3 acid ethyl esters 1 g capsule ?Commonly known as: LOVAZA ?Take 2 capsules (2 g total) by mouth 2 (two) times daily. ?  ?pioglitazone 30 MG tablet ?Commonly known as: ACTOS ?Take 1 tablet (30 mg total) by mouth daily. ?  ? ?  ? ? ? ?Objective:  ? ?BP 119/64   Pulse (!) 53   Ht 5' (1.524 m)   Wt 170 lb (77.1 kg)   SpO2 99%   BMI 33.20 kg/m?   ?Wt Readings from Last 3 Encounters:  ?12/04/21 170 lb (77.1 kg)  ?09/19/21 170 lb 9.6 oz (77.4 kg)  ?08/01/21 173 lb (78.5 kg)  ?  ?Physical Exam ?Vitals and nursing note reviewed.  ?Constitutional:   ?   General: She is not in acute distress. ?   Appearance: She is well-developed. She is not diaphoretic.  ?Eyes:  ?   Conjunctiva/sclera: Conjunctivae normal.  ?Cardiovascular:  ?   Rate and Rhythm: Normal rate and regular rhythm.  ?   Heart sounds: Normal heart sounds. No murmur heard. ?Pulmonary:  ?   Effort: Pulmonary effort is normal. No respiratory distress.  ?   Breath sounds: Normal breath sounds. No wheezing.  ?Musculoskeletal:     ?   General: No tenderness. Normal range of motion.  ?Skin: ?   General: Skin is warm and dry.  ?   Findings: No rash.   ?Neurological:  ?   Mental Status: She is alert and oriented to person, place, and time.  ?   Coordination: Coordination normal.  ?Psychiatric:     ?   Behavior: Behavior normal.  ? ? ?Results for orders placed or performed in visit on 11/29/21  ?Bayer DCA Hb A1c Waived  ?Result Value Ref Range  ? HB A1C (BAYER DCA - WAIVED) 6.0 (H) 4.8 - 5.6 %  ?Lipid panel  ?Result Value Ref Range  ? Cholesterol, Total 155 100 - 199 mg/dL  ? Triglycerides 173 (H) 0 - 149 mg/dL  ? HDL 53 >39 mg/dL  ? VLDL Cholesterol Cal 29 5 - 40 mg/dL  ? LDL Chol Calc (NIH) 73 0 - 99 mg/dL  ? Chol/HDL  Ratio 2.9 0.0 - 4.4 ratio  ?CMP14+EGFR  ?Result Value Ref Range  ? Glucose 112 (H) 70 - 99 mg/dL  ? BUN 11 8 - 27 mg/dL  ? Creatinine, Ser 0.73 0.57 - 1.00 mg/dL  ? eGFR 82 >59 mL/min/1.73  ? BUN/Creatinine Ratio 15 12 - 28  ? Sodium 136 134 - 144 mmol/L  ? Potassium 4.8 3.5 - 5.2 mmol/L  ? Chloride 96 96 - 106 mmol/L  ? CO2 23 20 - 29 mmol/L  ? Calcium 10.1 8.7 - 10.3 mg/dL  ? Total Protein 6.8 6.0 - 8.5 g/dL  ? Albumin 4.3 3.6 - 4.6 g/dL  ? Globulin, Total 2.5 1.5 - 4.5 g/dL  ? Albumin/Globulin Ratio 1.7 1.2 - 2.2  ? Bilirubin Total 0.4 0.0 - 1.2 mg/dL  ? Alkaline Phosphatase 84 44 - 121 IU/L  ? AST 32 0 - 40 IU/L  ? ALT 23 0 - 32 IU/L  ?CBC with Differential/Platelet  ?Result Value Ref Range  ? WBC 5.4 3.4 - 10.8 x10E3/uL  ? RBC 4.02 3.77 - 5.28 x10E6/uL  ? Hemoglobin 12.6 11.1 - 15.9 g/dL  ? Hematocrit 37.8 34.0 - 46.6 %  ? MCV 94 79 - 97 fL  ? MCH 31.3 26.6 - 33.0 pg  ? MCHC 33.3 31.5 - 35.7 g/dL  ? RDW 12.5 11.7 - 15.4 %  ? Platelets 226 150 - 450 x10E3/uL  ? Neutrophils 55 Not Estab. %  ? Lymphs 30 Not Estab. %  ? Monocytes 12 Not Estab. %  ? Eos 2 Not Estab. %  ? Basos 1 Not Estab. %  ? Neutrophils Absolute 3.0 1.4 - 7.0 x10E3/uL  ? Lymphocytes Absolute 1.6 0.7 - 3.1 x10E3/uL  ? Monocytes Absolute 0.6 0.1 - 0.9 x10E3/uL  ? EOS (ABSOLUTE) 0.1 0.0 - 0.4 x10E3/uL  ? Basophils Absolute 0.0 0.0 - 0.2 x10E3/uL  ? Immature Granulocytes 0 Not  Estab. %  ? Immature Grans (Abs) 0.0 0.0 - 0.1 x10E3/uL  ? ? ?Assessment & Plan:  ? ?Problem List Items Addressed This Visit   ? ?  ? Cardiovascular and Mediastinum  ? Essential hypertension  ? Relevant Medications  ? amLODipine

## 2021-12-09 DIAGNOSIS — M9903 Segmental and somatic dysfunction of lumbar region: Secondary | ICD-10-CM | POA: Diagnosis not present

## 2021-12-09 DIAGNOSIS — M9901 Segmental and somatic dysfunction of cervical region: Secondary | ICD-10-CM | POA: Diagnosis not present

## 2021-12-09 DIAGNOSIS — M9904 Segmental and somatic dysfunction of sacral region: Secondary | ICD-10-CM | POA: Diagnosis not present

## 2021-12-09 DIAGNOSIS — M62830 Muscle spasm of back: Secondary | ICD-10-CM | POA: Diagnosis not present

## 2021-12-11 ENCOUNTER — Other Ambulatory Visit: Payer: Self-pay | Admitting: Cardiology

## 2021-12-17 DIAGNOSIS — E1142 Type 2 diabetes mellitus with diabetic polyneuropathy: Secondary | ICD-10-CM | POA: Diagnosis not present

## 2021-12-17 DIAGNOSIS — M79676 Pain in unspecified toe(s): Secondary | ICD-10-CM | POA: Diagnosis not present

## 2021-12-17 DIAGNOSIS — B351 Tinea unguium: Secondary | ICD-10-CM | POA: Diagnosis not present

## 2021-12-17 DIAGNOSIS — L84 Corns and callosities: Secondary | ICD-10-CM | POA: Diagnosis not present

## 2021-12-23 DIAGNOSIS — M9903 Segmental and somatic dysfunction of lumbar region: Secondary | ICD-10-CM | POA: Diagnosis not present

## 2021-12-23 DIAGNOSIS — M9904 Segmental and somatic dysfunction of sacral region: Secondary | ICD-10-CM | POA: Diagnosis not present

## 2021-12-23 DIAGNOSIS — M62830 Muscle spasm of back: Secondary | ICD-10-CM | POA: Diagnosis not present

## 2021-12-23 DIAGNOSIS — M9901 Segmental and somatic dysfunction of cervical region: Secondary | ICD-10-CM | POA: Diagnosis not present

## 2022-01-06 DIAGNOSIS — M9903 Segmental and somatic dysfunction of lumbar region: Secondary | ICD-10-CM | POA: Diagnosis not present

## 2022-01-06 DIAGNOSIS — M62830 Muscle spasm of back: Secondary | ICD-10-CM | POA: Diagnosis not present

## 2022-01-06 DIAGNOSIS — M9904 Segmental and somatic dysfunction of sacral region: Secondary | ICD-10-CM | POA: Diagnosis not present

## 2022-01-06 DIAGNOSIS — M9901 Segmental and somatic dysfunction of cervical region: Secondary | ICD-10-CM | POA: Diagnosis not present

## 2022-01-20 DIAGNOSIS — M9904 Segmental and somatic dysfunction of sacral region: Secondary | ICD-10-CM | POA: Diagnosis not present

## 2022-01-20 DIAGNOSIS — M62830 Muscle spasm of back: Secondary | ICD-10-CM | POA: Diagnosis not present

## 2022-01-20 DIAGNOSIS — M9901 Segmental and somatic dysfunction of cervical region: Secondary | ICD-10-CM | POA: Diagnosis not present

## 2022-01-20 DIAGNOSIS — M9903 Segmental and somatic dysfunction of lumbar region: Secondary | ICD-10-CM | POA: Diagnosis not present

## 2022-01-30 ENCOUNTER — Encounter (INDEPENDENT_AMBULATORY_CARE_PROVIDER_SITE_OTHER): Payer: Medicare Other | Admitting: Ophthalmology

## 2022-02-03 DIAGNOSIS — M9903 Segmental and somatic dysfunction of lumbar region: Secondary | ICD-10-CM | POA: Diagnosis not present

## 2022-02-03 DIAGNOSIS — M9901 Segmental and somatic dysfunction of cervical region: Secondary | ICD-10-CM | POA: Diagnosis not present

## 2022-02-03 DIAGNOSIS — M9904 Segmental and somatic dysfunction of sacral region: Secondary | ICD-10-CM | POA: Diagnosis not present

## 2022-02-03 DIAGNOSIS — M62830 Muscle spasm of back: Secondary | ICD-10-CM | POA: Diagnosis not present

## 2022-02-06 ENCOUNTER — Encounter (INDEPENDENT_AMBULATORY_CARE_PROVIDER_SITE_OTHER): Payer: Self-pay | Admitting: Ophthalmology

## 2022-02-06 ENCOUNTER — Ambulatory Visit (INDEPENDENT_AMBULATORY_CARE_PROVIDER_SITE_OTHER): Payer: Medicare Other | Admitting: Ophthalmology

## 2022-02-06 ENCOUNTER — Encounter (INDEPENDENT_AMBULATORY_CARE_PROVIDER_SITE_OTHER): Payer: 59 | Admitting: Ophthalmology

## 2022-02-06 DIAGNOSIS — D3132 Benign neoplasm of left choroid: Secondary | ICD-10-CM

## 2022-02-06 DIAGNOSIS — E1169 Type 2 diabetes mellitus with other specified complication: Secondary | ICD-10-CM

## 2022-02-06 NOTE — Assessment & Plan Note (Signed)
Risk factors for malignant transformation of a choroidal nevus: [  ]thickness greater than 3 mm,  [  ]subretinal fluid,  [  ]symptoms,  [  ]orange pigment present,  [  ]margin within 3 mm of the optic disc,  [  ]ultrasonographic hollowness (versus solid/flat), medium internal reflectivity on scan [  ]absence of halo (A halo refers to a pigmented choroidal nevus surrounded by a circular band of depigmentation) [  ]absence of drusen [  ]size larger than 59m basal diameter (over 3 dd size) [  ]observed growth 0/9 risk factors AJO: 202, June 2019, 100-108  The nature of choroidal nevus was discussed with the patient and an informational form was offered.  Photo documentation was discussed with the patient.  Periodic follow-up may be needed for a lifetime. The patient's questions were answered. At minimum, annual exams will be needed if no signs of early growth.

## 2022-02-06 NOTE — Assessment & Plan Note (Signed)
No detectable diabetic retinopathy 

## 2022-02-06 NOTE — Progress Notes (Signed)
02/06/2022     CHIEF COMPLAINT Patient presents for  Chief Complaint  Patient presents with   Retina Follow Up      HISTORY OF PRESENT ILLNESS: Misty Smith is a 81 y.o. female who presents to the clinic today for:   HPI     Retina Follow Up           Diagnosis: Choroidal Nevus   Laterality: left eye   Onset: 1 year ago         Comments   1 yr fu OU FP. Patient states vision is stable and unchanged since last visit. Denies any new floaters or FOL. Patient denies hospitalizations or surgeries since last visit. Patient allergy to Ciprofloxacin.       Last edited by Laurin Coder on 02/06/2022  1:34 PM.      Referring physician: Clent Jacks, MD [address] [place],  [place] [id number]  HISTORICAL INFORMATION:   Selected notes from the MEDICAL RECORD NUMBER    Lab Results  Component Value Date   HGBA1C 6.0 (H) 11/29/2021     CURRENT MEDICATIONS: No current outpatient medications on file. (Ophthalmic Drugs)   No current facility-administered medications for this visit. (Ophthalmic Drugs)   Current Outpatient Medications (Other)  Medication Sig   amLODipine (NORVASC) 10 MG tablet TAKE 1 TABLET BY MOUTH EVERY DAY   aspirin EC 81 MG tablet Take 1 tablet (81 mg total) by mouth daily.   atorvastatin (LIPITOR) 40 MG tablet Take 1 tablet (40 mg total) by mouth at bedtime.   betamethasone dipropionate 0.05 % cream Apply topically.   Calcium Carb-Cholecalciferol 600-800 MG-UNIT TABS Take 1 tablet by mouth every evening.    cholecalciferol (VITAMIN D) 1000 UNITS tablet Take 2,000 Units by mouth daily.   CLINPRO 5000 1.1 % PSTE Place onto teeth 2 (two) times daily.   escitalopram (LEXAPRO) 20 MG tablet Take 1 tablet (20 mg total) by mouth daily.   esomeprazole (NEXIUM) 40 MG capsule Take 1 capsule (40 mg total) by mouth daily.   estradiol (ESTRACE) 0.1 MG/GM vaginal cream Place vaginally.   hydrochlorothiazide (HYDRODIURIL) 12.5 MG tablet Take 1 tablet  (12.5 mg total) by mouth daily.   hydrocortisone 2.5 % cream PLEASE SEE ATTACHED FOR DETAILED DIRECTIONS   irbesartan (AVAPRO) 75 MG tablet Take 1 tablet (75 mg total) by mouth daily.   ketoconazole (NIZORAL) 2 % cream Apply topically 2 (two) times daily as needed.   metoprolol tartrate (LOPRESSOR) 25 MG tablet Take 0.5 tablets (12.5 mg total) by mouth 2 (two) times daily.   Multiple Vitamin (MULTIVITAMIN) tablet Take 1 tablet by mouth every morning.    omega-3 acid ethyl esters (LOVAZA) 1 g capsule Take 2 capsules (2 g total) by mouth 2 (two) times daily.   pioglitazone (ACTOS) 30 MG tablet Take 1 tablet (30 mg total) by mouth daily.   No current facility-administered medications for this visit. (Other)      REVIEW OF SYSTEMS: ROS   Negative for: Constitutional, Gastrointestinal, Neurological, Skin, Genitourinary, Musculoskeletal, HENT, Endocrine, Cardiovascular, Eyes, Respiratory, Psychiatric, Allergic/Imm, Heme/Lymph Last edited by Hurman Horn, MD on 02/06/2022  1:57 PM.       ALLERGIES Allergies  Allergen Reactions   Ciprofloxacin Rash   Codeine Rash   Iohexol      Desc: PT HAD HIVES AND WAS GIVEN BENEDRYL PO BY NURSE AND OBSERVED IN NURSES STATION AND RELEASED WITHOUT AND OTHER COMPLICATIONS SHEET SCANNED UNDER NURSES NOTES///ON  12/01/11 PT HAD ANOTHER CT WITH PREMEDS MINUS THE BENADRYL. SHE HAD A DELAYED REACTION AFTER WITH THROAT TIGHTNESS AND SOB, JB  12/07/12    Penicillins Hives   Sulfonamide Derivatives Hives   Asa [Aspirin] Other (See Comments)    Large amounts causes stomach pain   Keflex [Cephalexin] Nausea And Vomiting   Morphine And Related Rash    PAST MEDICAL HISTORY Past Medical History:  Diagnosis Date   Allergy    Anxiety    Arthritis    Bronchitis    Cataract    Colovaginal fistula    Depression    Diabetes mellitus without complication (HCC)    Dysrhythmia    palpitations, occasional PVCs; Dr Stanford Breed cardiologist   Esophageal stricture     Fractured elbow 1970's   GERD (gastroesophageal reflux disease)    H/O hiatal hernia    Hyperlipidemia    Hypertension    Osteopenia    Perimenopausal vasomotor symptoms    Postmenopausal HRT (hormone replacement therapy)    Pre-diabetes    Stress    Subacute lupus erythematosus    Dr. Allyson Sabal   Past Surgical History:  Procedure Laterality Date   abdominal bypass  1987   fibroids    ABDOMINAL HYSTERECTOMY     partial   bilateral tubal ligtation  1972   CHOLECYSTECTOMY N/A 03/21/2014   Procedure: LAPAROSCOPIC CHOLECYSTECTOMY WITH INTRAOPERATIVE CHOLANGIOGRAM;  Surgeon: Shann Medal, MD;  Location: [facility];  Service: General;  Laterality: N/A;   COLONOSCOPY     elbow Right 1976   surgery after fracture of elbow   EYE SURGERY Bilateral    cataracts   ROTATOR CUFF REPAIR Right 03/29/2018   TONSILLECTOMY     TUBAL LIGATION      FAMILY HISTORY Family History  Problem Relation Age of Onset   Cancer Mother        laryngeal    Hypertension Mother    Heart disease Mother    Hypertension Father    Coronary artery disease Father    Kidney disease Father        kidney failure    Hypertension Sister    Cancer Sister        breast   Heart attack Sister    Heart disease Sister    Stroke Sister    Stroke Sister    Hypertension Brother    Cancer Brother        liver   Hypertension Sister    Hyperlipidemia Sister    Hypertension Brother    Heart attack Brother    Hypertension Son    Hypertension Daughter     SOCIAL HISTORY Social History   Tobacco Use   Smoking status: Never   Smokeless tobacco: Never  Vaping Use   Vaping Use: Never used  Substance Use Topics   Alcohol use: No    Alcohol/week: 0.0 standard drinks of alcohol   Drug use: No         OPHTHALMIC EXAM:  Base Eye Exam     Visual Acuity (ETDRS)       Right Left   Dist [place] 20/20 20/15         Tonometry (Tonopen, 1:36 PM)       Right Left   Pressure 16 14         Pupils       Pupils  Dark Light React APD   Right PERRL 3 3 Minimal None   Left PERRL 3 3 Minimal None  Visual Fields (Counting fingers)       Left Right    Full Full         Extraocular Movement       Right Left    Full Full         Neuro/Psych     Oriented x3: Yes   Mood/Affect: Normal         Dilation     Both eyes: 1.0% Mydriacyl, 2.5% Phenylephrine @ 1:36 PM           Slit Lamp and Fundus Exam     External Exam       Right Left   External Normal Normal         Slit Lamp Exam       Right Left   Lids/Lashes Normal Normal   Conjunctiva/Sclera White and quiet White and quiet   Cornea Clear Clear   Anterior Chamber Deep and quiet Deep and quiet   Iris Round and reactive Round and reactive   Lens Posterior chamber intraocular lens Posterior chamber intraocular lens   Anterior Vitreous Normal Normal         Fundus Exam       Right Left   Posterior Vitreous Normal Normal   Disc Normal Normal   C/D Ratio 0.4 0.45   Macula Normal Microaneurysms, 2 on the superior edge of FAZ no exudates no thickening, no macular thickening, no interval change over time   Vessels no DR no DR   Periphery Normal benign, reticulated RPE Choroidal nevus, superotemporal, roughly 2 DD and diameter, flat with white drusen.  No lipofuscin no fluid, no halo no atrophy and no subretinal fluid, no interval change            IMAGING AND PROCEDURES  Imaging and Procedures for 02/06/22  Color Fundus Photography Optos - OU - Both Eyes       Right Eye Progression has been stable. Disc findings include normal observations. Macula : normal observations. Vessels : normal observations. Periphery : normal observations.   Left Eye Progression has been stable. Disc findings include normal observations. Vessels : normal observations. Periphery : normal observations.   Notes OS choroidal nevus superotemporal to the arcade superiorly with no subretinal fluid, no high risk features.   Stable over the last 7 years of photodocumentation.             ASSESSMENT/PLAN:  Choroidal nevus, left Risk factors for malignant transformation of a choroidal nevus: [  ]thickness greater than 3 mm,  [  ]subretinal fluid,  [  ]symptoms,  [  ]orange pigment present,  [  ]margin within 3 mm of the optic disc,  [  ]ultrasonographic hollowness (versus solid/flat), medium internal reflectivity on scan [  ]absence of halo (A halo refers to a pigmented choroidal nevus surrounded by a circular band of depigmentation) [  ]absence of drusen [  ]size larger than 46m basal diameter (over 3 dd size) [  ]observed growth 0/9 risk factors AJO: 202, June 2019, 100-108  The nature of choroidal nevus was discussed with the patient and an informational form was offered.  Photo documentation was discussed with the patient.  Periodic follow-up may be needed for a lifetime. The patient's questions were answered. At minimum, annual exams will be needed if no signs of early growth.  Type 2 diabetes mellitus with other specified complication (HCC) No detectable diabetic retinopathy     ICD-10-CM   1. Choroidal nevus, left  D31.32 Color  Fundus Photography Optos - OU - Both Eyes    2. Type 2 diabetes mellitus with other specified complication, without long-term current use of insulin (HCC)  E11.69       1.  2.  3.  Ophthalmic Meds Ordered this visit:  No orders of the defined types were placed in this encounter.      Return in about 1 year (around 02/07/2023) for DILATE OU, COLOR FP, OCT.  There are no Patient Instructions on file for this visit.   Explained the diagnoses, plan, and follow up with the patient and they expressed understanding.  Patient expressed understanding of the importance of proper follow up care.   Clent Demark Emeree Mahler M.D. Diseases & Surgery of the Retina and Vitreous [facility]ina [facility] Diabetic [place] 02/06/22     Abbreviations: M myopia (nearsighted); A astigmatism;  H hyperopia (farsighted); P presbyopia; Mrx spectacle prescription;  CTL contact lenses; OD right eye; OS left eye; OU both eyes  XT exotropia; ET esotropia; PEK punctate epithelial keratitis; PEE punctate epithelial erosions; DES dry eye syndrome; MGD meibomian gland dysfunction; ATs artificial tears; PFAT's preservative free artificial tears; [place] nuclear sclerotic cataract; PSC posterior subcapsular cataract; ERM epi-retinal membrane; PVD posterior vitreous detachment; RD retinal detachment; DM diabetes mellitus; DR diabetic retinopathy; NPDR non-proliferative diabetic retinopathy; PDR proliferative diabetic retinopathy; CSME clinically significant macular edema; DME diabetic macular edema; dbh dot blot hemorrhages; CWS cotton wool spot; POAG primary open angle glaucoma; C/D cup-to-disc ratio; HVF humphrey visual field; GVF goldmann visual field; OCT optical coherence tomography; IOP intraocular pressure; BRVO Branch retinal vein occlusion; CRVO central retinal vein occlusion; CRAO central retinal artery occlusion; BRAO branch retinal artery occlusion; RT retinal tear; SB scleral buckle; PPV pars plana vitrectomy; VH Vitreous hemorrhage; PRP panretinal laser photocoagulation; IVK intravitreal kenalog; VMT vitreomacular traction; MH Macular hole;  NVD neovascularization of the disc; NVE neovascularization elsewhere; AREDS age related eye disease study; ARMD age related macular degeneration; POAG primary open angle glaucoma; EBMD epithelial/anterior basement membrane dystrophy; ACIOL anterior chamber intraocular lens; IOL intraocular lens; PCIOL posterior chamber intraocular lens; Phaco/IOL phacoemulsification with intraocular lens placement; [place] photorefractive keratectomy; LASIK laser assisted in situ keratomileusis; HTN hypertension; DM diabetes mellitus; COPD chronic obstructive pulmonary disease

## 2022-02-17 DIAGNOSIS — M62830 Muscle spasm of back: Secondary | ICD-10-CM | POA: Diagnosis not present

## 2022-02-17 DIAGNOSIS — M9903 Segmental and somatic dysfunction of lumbar region: Secondary | ICD-10-CM | POA: Diagnosis not present

## 2022-02-17 DIAGNOSIS — M9904 Segmental and somatic dysfunction of sacral region: Secondary | ICD-10-CM | POA: Diagnosis not present

## 2022-02-17 DIAGNOSIS — M9901 Segmental and somatic dysfunction of cervical region: Secondary | ICD-10-CM | POA: Diagnosis not present

## 2022-02-25 DIAGNOSIS — L84 Corns and callosities: Secondary | ICD-10-CM | POA: Diagnosis not present

## 2022-02-25 DIAGNOSIS — B351 Tinea unguium: Secondary | ICD-10-CM | POA: Diagnosis not present

## 2022-02-25 DIAGNOSIS — M79676 Pain in unspecified toe(s): Secondary | ICD-10-CM | POA: Diagnosis not present

## 2022-02-25 DIAGNOSIS — E1142 Type 2 diabetes mellitus with diabetic polyneuropathy: Secondary | ICD-10-CM | POA: Diagnosis not present

## 2022-03-03 DIAGNOSIS — M9903 Segmental and somatic dysfunction of lumbar region: Secondary | ICD-10-CM | POA: Diagnosis not present

## 2022-03-03 DIAGNOSIS — M62830 Muscle spasm of back: Secondary | ICD-10-CM | POA: Diagnosis not present

## 2022-03-03 DIAGNOSIS — M9901 Segmental and somatic dysfunction of cervical region: Secondary | ICD-10-CM | POA: Diagnosis not present

## 2022-03-03 DIAGNOSIS — M9904 Segmental and somatic dysfunction of sacral region: Secondary | ICD-10-CM | POA: Diagnosis not present

## 2022-03-11 ENCOUNTER — Other Ambulatory Visit: Payer: Self-pay | Admitting: Family Medicine

## 2022-03-11 DIAGNOSIS — Z1231 Encounter for screening mammogram for malignant neoplasm of breast: Secondary | ICD-10-CM

## 2022-03-13 DIAGNOSIS — L820 Inflamed seborrheic keratosis: Secondary | ICD-10-CM | POA: Diagnosis not present

## 2022-03-13 DIAGNOSIS — L304 Erythema intertrigo: Secondary | ICD-10-CM | POA: Diagnosis not present

## 2022-03-13 DIAGNOSIS — L298 Other pruritus: Secondary | ICD-10-CM | POA: Diagnosis not present

## 2022-03-13 DIAGNOSIS — L821 Other seborrheic keratosis: Secondary | ICD-10-CM | POA: Diagnosis not present

## 2022-03-13 DIAGNOSIS — D225 Melanocytic nevi of trunk: Secondary | ICD-10-CM | POA: Diagnosis not present

## 2022-03-13 DIAGNOSIS — L814 Other melanin hyperpigmentation: Secondary | ICD-10-CM | POA: Diagnosis not present

## 2022-03-13 DIAGNOSIS — L718 Other rosacea: Secondary | ICD-10-CM | POA: Diagnosis not present

## 2022-03-13 DIAGNOSIS — L570 Actinic keratosis: Secondary | ICD-10-CM | POA: Diagnosis not present

## 2022-03-13 DIAGNOSIS — L538 Other specified erythematous conditions: Secondary | ICD-10-CM | POA: Diagnosis not present

## 2022-03-13 DIAGNOSIS — L281 Prurigo nodularis: Secondary | ICD-10-CM | POA: Diagnosis not present

## 2022-03-17 DIAGNOSIS — M9901 Segmental and somatic dysfunction of cervical region: Secondary | ICD-10-CM | POA: Diagnosis not present

## 2022-03-17 DIAGNOSIS — M9903 Segmental and somatic dysfunction of lumbar region: Secondary | ICD-10-CM | POA: Diagnosis not present

## 2022-03-17 DIAGNOSIS — M9904 Segmental and somatic dysfunction of sacral region: Secondary | ICD-10-CM | POA: Diagnosis not present

## 2022-03-17 DIAGNOSIS — M62830 Muscle spasm of back: Secondary | ICD-10-CM | POA: Diagnosis not present

## 2022-03-24 ENCOUNTER — Ambulatory Visit
Admission: RE | Admit: 2022-03-24 | Discharge: 2022-03-24 | Disposition: A | Discharge status: Home / Self Care | Payer: Medicare Other | Source: Ambulatory Visit | Attending: Family Medicine | Admitting: Family Medicine

## 2022-03-24 DIAGNOSIS — Z1231 Encounter for screening mammogram for malignant neoplasm of breast: Secondary | ICD-10-CM | POA: Diagnosis not present

## 2022-03-31 ENCOUNTER — Telehealth: Payer: Self-pay | Admitting: Family Medicine

## 2022-03-31 DIAGNOSIS — M9903 Segmental and somatic dysfunction of lumbar region: Secondary | ICD-10-CM | POA: Diagnosis not present

## 2022-03-31 DIAGNOSIS — M62830 Muscle spasm of back: Secondary | ICD-10-CM | POA: Diagnosis not present

## 2022-03-31 DIAGNOSIS — M9901 Segmental and somatic dysfunction of cervical region: Secondary | ICD-10-CM | POA: Diagnosis not present

## 2022-03-31 DIAGNOSIS — M9904 Segmental and somatic dysfunction of sacral region: Secondary | ICD-10-CM | POA: Diagnosis not present

## 2022-03-31 NOTE — Telephone Encounter (Signed)
Orders have already been placed.

## 2022-04-01 ENCOUNTER — Other Ambulatory Visit: Payer: Medicare Other

## 2022-04-01 DIAGNOSIS — E7800 Pure hypercholesterolemia, unspecified: Secondary | ICD-10-CM

## 2022-04-01 DIAGNOSIS — E1169 Type 2 diabetes mellitus with other specified complication: Secondary | ICD-10-CM | POA: Diagnosis not present

## 2022-04-01 DIAGNOSIS — I10 Essential (primary) hypertension: Secondary | ICD-10-CM | POA: Diagnosis not present

## 2022-04-01 LAB — LIPID PANEL
Chol/HDL Ratio: 3.5 ratio (ref 0.0–4.4)
Cholesterol, Total: 150 mg/dL (ref 100–199)
HDL: 43 mg/dL (ref >39)
LDL Chol Calc (NIH): 71 mg/dL (ref 0–99)
Triglycerides: 220 mg/dL — ABNORMAL HIGH (ref 0–149)
VLDL Cholesterol Cal: 36 mg/dL (ref 5–40)

## 2022-04-01 LAB — CMP14+EGFR
ALT: 19 IU/L (ref 0–32)
AST: 29 IU/L (ref 0–40)
Albumin/Globulin Ratio: 1.7 (ref 1.2–2.2)
Albumin: 4.1 g/dL (ref 3.7–4.7)
Alkaline Phosphatase: 69 IU/L (ref 44–121)
BUN/Creatinine Ratio: 19 (ref 12–28)
BUN: 15 mg/dL (ref 8–27)
Bilirubin Total: 0.5 mg/dL (ref 0.0–1.2)
CO2: 23 mmol/L (ref 20–29)
Calcium: 9.7 mg/dL (ref 8.7–10.3)
Chloride: 97 mmol/L (ref 96–106)
Creatinine, Ser: 0.77 mg/dL (ref 0.57–1.00)
Globulin, Total: 2.4 g/dL (ref 1.5–4.5)
Glucose: 112 mg/dL — ABNORMAL HIGH (ref 70–99)
Potassium: 4.5 mmol/L (ref 3.5–5.2)
Sodium: 136 mmol/L (ref 134–144)
Total Protein: 6.5 g/dL (ref 6.0–8.5)
eGFR: 76 mL/min/{1.73_m2} (ref >59)

## 2022-04-01 LAB — BAYER DCA HB A1C WAIVED: HB A1C (BAYER DCA - WAIVED): 5.6 % (ref 4.8–5.6)

## 2022-04-03 ENCOUNTER — Other Ambulatory Visit: Payer: Self-pay | Admitting: Family Medicine

## 2022-04-04 ENCOUNTER — Ambulatory Visit (INDEPENDENT_AMBULATORY_CARE_PROVIDER_SITE_OTHER): Payer: Medicare Other | Admitting: Family Medicine

## 2022-04-04 ENCOUNTER — Encounter: Payer: Self-pay | Admitting: Family Medicine

## 2022-04-04 VITALS — BP 121/51 | HR 50 | Temp 97.3°F | Ht 60.0 in | Wt 166.0 lb

## 2022-04-04 DIAGNOSIS — K219 Gastro-esophageal reflux disease without esophagitis: Secondary | ICD-10-CM

## 2022-04-04 DIAGNOSIS — E7800 Pure hypercholesterolemia, unspecified: Secondary | ICD-10-CM | POA: Diagnosis not present

## 2022-04-04 DIAGNOSIS — F411 Generalized anxiety disorder: Secondary | ICD-10-CM | POA: Diagnosis not present

## 2022-04-04 DIAGNOSIS — I2510 Atherosclerotic heart disease of native coronary artery without angina pectoris: Secondary | ICD-10-CM | POA: Diagnosis not present

## 2022-04-04 DIAGNOSIS — E1169 Type 2 diabetes mellitus with other specified complication: Secondary | ICD-10-CM | POA: Diagnosis not present

## 2022-04-04 DIAGNOSIS — I10 Essential (primary) hypertension: Secondary | ICD-10-CM

## 2022-04-04 MED ORDER — HYDROCHLOROTHIAZIDE 12.5 MG PO TABS: 12.5000 mg | ORAL_TABLET | Freq: Every day | ORAL | 3 refills | Status: DC

## 2022-04-04 MED ORDER — ESCITALOPRAM OXALATE 20 MG PO TABS
20.0000 mg | ORAL_TABLET | Freq: Every day | ORAL | 3 refills | Status: DC
Start: 2022-04-04 — End: 2023-04-06

## 2022-04-04 MED ORDER — OMEGA-3-ACID ETHYL ESTERS 1 G PO CAPS: 2.0000 | ORAL_CAPSULE | Freq: Two times a day (BID) | ORAL | 1 refills | Status: DC

## 2022-04-04 MED ORDER — METOPROLOL TARTRATE 25 MG PO TABS
12.5000 mg | ORAL_TABLET | Freq: Two times a day (BID) | ORAL | 3 refills | Status: DC
Start: 2022-04-04 — End: 2023-02-25

## 2022-04-04 MED ORDER — PIOGLITAZONE HCL 30 MG PO TABS
30.0000 mg | ORAL_TABLET | Freq: Every day | ORAL | 3 refills | Status: DC
Start: 2022-04-04 — End: 2023-04-06

## 2022-04-04 MED ORDER — ESOMEPRAZOLE MAGNESIUM 40 MG PO CPDR
40.0000 mg | DELAYED_RELEASE_CAPSULE | Freq: Every day | ORAL | 3 refills | Status: DC
Start: 2022-04-04 — End: 2023-04-06

## 2022-04-04 NOTE — Progress Notes (Signed)
BP (!) 121/51   Pulse (!) 50   Temp (!) 97.3 F (36.3 C)   Ht 5' (1.524 m)   Wt 166 lb (75.3 kg)   SpO2 96%   BMI 32.42 kg/m    Subjective:   Patient ID: Misty Smith, female    DOB: 1940/11/07, 81 y.o.   MRN: [id number]  HPI: Misty Smith is a 81 y.o. female presenting on 04/04/2022 for Medical Management of Chronic Issues   HPI Hypertension Patient is currently on amlodipine and hydrochlorothiazide irbesartan and metoprolol, and their blood pressure today is 121/51. Patient denies any lightheadedness or dizziness. Patient denies headaches, blurred vision, chest pains, shortness of breath, or weakness. Denies any side effects from medication and is content with current medication.   Hyperlipidemia Patient is coming in for recheck of his hyperlipidemia. The patient is currently taking fish oils and atorvastatin. They deny any issues with myalgias or history of liver damage from it. They deny any focal numbness or weakness or chest pain.   GERD Patient is currently on Nexium.  She denies any major symptoms or abdominal pain or belching or burping. She denies any blood in her stool or lightheadedness or dizziness.   Type 2 diabetes mellitus Patient comes in today for recheck of his diabetes. Patient has been currently taking Actos. Patient is currently on an ACE inhibitor/ARB. Patient has not seen an ophthalmologist this year. Patient denies any issues with their feet. The symptom started onset as an adult hypertension and hyperlipidemia CAD ARE RELATED TO DM   Anxiety recheck Patient is coming in today for anxiety recheck.  She is currently on Lexapro.  She feels like she is doing well.  Denies any issues with that.    04/04/2022    8:40 AM 06/03/2021    8:32 AM 04/01/2021    8:31 AM 03/14/2021    8:00 AM 01/25/2021    9:29 AM  Depression screen PHQ 2/9  Decreased Interest 0 0 0 0 0  Down, Depressed, Hopeless 0 0 0 0 0  PHQ - 2 Score 0 0 0 0 0  Altered sleeping 0  0 0 0   Tired, decreased energy 0  0 0 0  Change in appetite 0  0 0 0  Feeling bad or failure about yourself  0  0 0 0  Trouble concentrating 0  0 0 0  Moving slowly or fidgety/restless 0  0 0 0  Suicidal thoughts 0  0 0 0  PHQ-9 Score 0  0 0 0  Difficult doing work/chores Not difficult at all    Not difficult at all     Relevant past medical, surgical, family and social history reviewed and updated as indicated. Interim medical history since our last visit reviewed. Allergies and medications reviewed and updated.  Review of Systems  Constitutional:  Negative for chills and fever.  Eyes:  Negative for visual disturbance.  Respiratory:  Negative for chest tightness and shortness of breath.   Cardiovascular:  Negative for chest pain and leg swelling.  Musculoskeletal:  Negative for back pain and gait problem.  Skin:  Negative for rash.  Neurological:  Negative for dizziness, light-headedness and headaches.  Psychiatric/Behavioral:  Negative for agitation and behavioral problems.   All other systems reviewed and are negative.   Per HPI unless specifically indicated above   Allergies as of 04/04/2022       Reactions   Ciprofloxacin Rash   Codeine Rash   Iohexol  Desc: PT HAD HIVES AND WAS GIVEN BENEDRYL PO BY NURSE AND OBSERVED IN NURSES STATION AND RELEASED WITHOUT AND OTHER COMPLICATIONS SHEET SCANNED UNDER NURSES NOTES///ON 12/01/11 PT HAD ANOTHER CT WITH PREMEDS MINUS THE BENADRYL. SHE HAD A DELAYED REACTION AFTER WITH THROAT TIGHTNESS AND SOB, JB  12/07/12   Penicillins Hives   Sulfonamide Derivatives Hives   Asa [aspirin] Other (See Comments)   Large amounts causes stomach pain   Keflex [cephalexin] Nausea And Vomiting   Morphine And Related Rash        Medication List        Accurate as of April 04, 2022  8:54 AM. If you have any questions, ask your nurse or doctor.          amLODipine 10 MG tablet Commonly known as: NORVASC TAKE 1 TABLET BY MOUTH EVERY DAY    aspirin EC 81 MG tablet Take 1 tablet (81 mg total) by mouth daily.   atorvastatin 40 MG tablet Commonly known as: LIPITOR Take 1 tablet (40 mg total) by mouth at bedtime.   betamethasone dipropionate 0.05 % cream Apply topically.   Calcium Carb-Cholecalciferol 600-800 MG-UNIT Tabs Take 1 tablet by mouth every evening.   cholecalciferol 1000 units tablet Commonly known as: VITAMIN D Take 2,000 Units by mouth daily.   Clinpro 5000 1.1 % Pste Generic drug: Sodium Fluoride Place onto teeth 2 (two) times daily.   escitalopram 20 MG tablet Commonly known as: LEXAPRO Take 1 tablet (20 mg total) by mouth daily.   esomeprazole 40 MG capsule Commonly known as: NEXIUM Take 1 capsule (40 mg total) by mouth daily.   estradiol 0.1 MG/GM vaginal cream Commonly known as: ESTRACE Place vaginally.   hydrochlorothiazide 12.5 MG tablet Commonly known as: HYDRODIURIL Take 1 tablet (12.5 mg total) by mouth daily.   hydrocortisone 2.5 % cream PLEASE SEE ATTACHED FOR DETAILED DIRECTIONS   irbesartan 75 MG tablet Commonly known as: AVAPRO Take 1 tablet (75 mg total) by mouth daily.   ketoconazole 2 % cream Commonly known as: NIZORAL Apply topically 2 (two) times daily as needed.   metoprolol tartrate 25 MG tablet Commonly known as: LOPRESSOR Take 0.5 tablets (12.5 mg total) by mouth 2 (two) times daily.   multivitamin tablet Take 1 tablet by mouth every morning.   omega-3 acid ethyl esters 1 g capsule Commonly known as: LOVAZA Take 2 capsules (2 g total) by mouth 2 (two) times daily.   pioglitazone 30 MG tablet Commonly known as: ACTOS Take 1 tablet (30 mg total) by mouth daily.         Objective:   BP (!) 121/51   Pulse (!) 50   Temp (!) 97.3 F (36.3 C)   Ht 5' (1.524 m)   Wt 166 lb (75.3 kg)   SpO2 96%   BMI 32.42 kg/m   Wt Readings from Last 3 Encounters:  04/04/22 166 lb (75.3 kg)  12/04/21 170 lb (77.1 kg)  09/19/21 170 lb 9.6 oz (77.4 kg)     Physical Exam Vitals and nursing note reviewed.  Constitutional:      General: She is not in acute distress.    Appearance: She is well-developed. She is not diaphoretic.  Eyes:     Conjunctiva/sclera: Conjunctivae normal.  Cardiovascular:     Rate and Rhythm: Normal rate and regular rhythm.     Heart sounds: Normal heart sounds. No murmur heard. Pulmonary:     Effort: Pulmonary effort is normal. No respiratory distress.  Breath sounds: Normal breath sounds. No wheezing.  Musculoskeletal:        General: No swelling or tenderness. Normal range of motion.  Skin:    General: Skin is warm and dry.     Findings: No rash.  Neurological:     Mental Status: She is alert and oriented to person, place, and time.     Coordination: Coordination normal.  Psychiatric:        Behavior: Behavior normal.     Results for orders placed or performed in visit on 04/01/22  Lipid panel  Result Value Ref Range   Cholesterol, Total 150 100 - 199 mg/dL   Triglycerides 220 (H) 0 - 149 mg/dL   HDL 43 >39 mg/dL   VLDL Cholesterol Cal 36 5 - 40 mg/dL   LDL Chol Calc (NIH) 71 0 - 99 mg/dL   Chol/HDL Ratio 3.5 0.0 - 4.4 ratio  CMP14+EGFR  Result Value Ref Range   Glucose 112 (H) 70 - 99 mg/dL   BUN 15 8 - 27 mg/dL   Creatinine, Ser 0.77 0.57 - 1.00 mg/dL   eGFR 76 >59 mL/min/1.73   BUN/Creatinine Ratio 19 12 - 28   Sodium 136 134 - 144 mmol/L   Potassium 4.5 3.5 - 5.2 mmol/L   Chloride 97 96 - 106 mmol/L   CO2 23 20 - 29 mmol/L   Calcium 9.7 8.7 - 10.3 mg/dL   Total Protein 6.5 6.0 - 8.5 g/dL   Albumin 4.1 3.7 - 4.7 g/dL   Globulin, Total 2.4 1.5 - 4.5 g/dL   Albumin/Globulin Ratio 1.7 1.2 - 2.2   Bilirubin Total 0.5 0.0 - 1.2 mg/dL   Alkaline Phosphatase 69 44 - 121 IU/L   AST 29 0 - 40 IU/L   ALT 19 0 - 32 IU/L  Bayer DCA Hb A1c Waived  Result Value Ref Range   HB A1C (BAYER DCA - WAIVED) 5.6 4.8 - 5.6 %    Assessment & Plan:   Problem List Items Addressed This Visit        Cardiovascular and Mediastinum   Essential hypertension   Relevant Medications   hydrochlorothiazide (HYDRODIURIL) 12.5 MG tablet   metoprolol tartrate (LOPRESSOR) 25 MG tablet   omega-3 acid ethyl esters (LOVAZA) 1 g capsule     Digestive   GERD (gastroesophageal reflux disease)   Relevant Medications   esomeprazole (NEXIUM) 40 MG capsule     Endocrine   Type 2 diabetes mellitus with other specified complication (HCC) - Primary   Relevant Medications   pioglitazone (ACTOS) 30 MG tablet     Other   Hyperlipidemia   Relevant Medications   hydrochlorothiazide (HYDRODIURIL) 12.5 MG tablet   metoprolol tartrate (LOPRESSOR) 25 MG tablet   omega-3 acid ethyl esters (LOVAZA) 1 g capsule   Generalized anxiety disorder   Relevant Medications   escitalopram (LEXAPRO) 20 MG tablet    Blood work looks pretty good except for triglycerides are slightly elevated, continue current medicine.  No changes.  Denies hypoglycemia or lightheadedness or dizziness   Follow up plan: Return in about 4 months (around 08/04/2022), or if symptoms worsen or fail to improve, for Hypertension and hyperlipidemia and diabetes.  Counseling provided for all of the vaccine components No orders of the defined types were placed in this encounter.   Caryl Pina, MD [facility] 04/04/2022, 8:54 AM

## 2022-04-16 DIAGNOSIS — M9901 Segmental and somatic dysfunction of cervical region: Secondary | ICD-10-CM | POA: Diagnosis not present

## 2022-04-16 DIAGNOSIS — M9903 Segmental and somatic dysfunction of lumbar region: Secondary | ICD-10-CM | POA: Diagnosis not present

## 2022-04-16 DIAGNOSIS — M62830 Muscle spasm of back: Secondary | ICD-10-CM | POA: Diagnosis not present

## 2022-04-16 DIAGNOSIS — M9904 Segmental and somatic dysfunction of sacral region: Secondary | ICD-10-CM | POA: Diagnosis not present

## 2022-04-23 ENCOUNTER — Telehealth: Payer: Self-pay | Admitting: Cardiology

## 2022-04-23 NOTE — Telephone Encounter (Signed)
Returned call to pt she states that she was at her PCP and her HR was too low she states that it was 30. Informed pt that in the computer it said 50, "well it must have went up". Informed pt for the next few days 1 hour after taking medication take and log her bP/HR and give [place] a call/Mychart with these numbers for Dr's review. Reset mychart password to [email or web address] so daughter could start using mychart messages again. Verbalized understanding. She will call back with numbers or if she is still feeling "bad".

## 2022-04-23 NOTE — Telephone Encounter (Signed)
Pt states her bp has been low and it been making feel differently lately. She states she is unsure if it because of some of her medications

## 2022-04-30 DIAGNOSIS — M9903 Segmental and somatic dysfunction of lumbar region: Secondary | ICD-10-CM | POA: Diagnosis not present

## 2022-04-30 DIAGNOSIS — M62830 Muscle spasm of back: Secondary | ICD-10-CM | POA: Diagnosis not present

## 2022-04-30 DIAGNOSIS — M9901 Segmental and somatic dysfunction of cervical region: Secondary | ICD-10-CM | POA: Diagnosis not present

## 2022-04-30 DIAGNOSIS — M9904 Segmental and somatic dysfunction of sacral region: Secondary | ICD-10-CM | POA: Diagnosis not present

## 2022-05-06 DIAGNOSIS — L84 Corns and callosities: Secondary | ICD-10-CM | POA: Diagnosis not present

## 2022-05-06 DIAGNOSIS — E1142 Type 2 diabetes mellitus with diabetic polyneuropathy: Secondary | ICD-10-CM | POA: Diagnosis not present

## 2022-05-06 DIAGNOSIS — M79676 Pain in unspecified toe(s): Secondary | ICD-10-CM | POA: Diagnosis not present

## 2022-05-06 DIAGNOSIS — B351 Tinea unguium: Secondary | ICD-10-CM | POA: Diagnosis not present

## 2022-05-14 DIAGNOSIS — M9901 Segmental and somatic dysfunction of cervical region: Secondary | ICD-10-CM | POA: Diagnosis not present

## 2022-05-14 DIAGNOSIS — M62830 Muscle spasm of back: Secondary | ICD-10-CM | POA: Diagnosis not present

## 2022-05-14 DIAGNOSIS — M9904 Segmental and somatic dysfunction of sacral region: Secondary | ICD-10-CM | POA: Diagnosis not present

## 2022-05-14 DIAGNOSIS — M9903 Segmental and somatic dysfunction of lumbar region: Secondary | ICD-10-CM | POA: Diagnosis not present

## 2022-05-28 DIAGNOSIS — M9903 Segmental and somatic dysfunction of lumbar region: Secondary | ICD-10-CM | POA: Diagnosis not present

## 2022-05-28 DIAGNOSIS — M62830 Muscle spasm of back: Secondary | ICD-10-CM | POA: Diagnosis not present

## 2022-05-28 DIAGNOSIS — M9901 Segmental and somatic dysfunction of cervical region: Secondary | ICD-10-CM | POA: Diagnosis not present

## 2022-05-28 DIAGNOSIS — M9904 Segmental and somatic dysfunction of sacral region: Secondary | ICD-10-CM | POA: Diagnosis not present

## 2022-05-29 DIAGNOSIS — M1712 Unilateral primary osteoarthritis, left knee: Secondary | ICD-10-CM | POA: Diagnosis not present

## 2022-06-05 DIAGNOSIS — M1712 Unilateral primary osteoarthritis, left knee: Secondary | ICD-10-CM | POA: Diagnosis not present

## 2022-06-12 DIAGNOSIS — M1711 Unilateral primary osteoarthritis, right knee: Secondary | ICD-10-CM | POA: Diagnosis not present

## 2022-06-12 DIAGNOSIS — M1712 Unilateral primary osteoarthritis, left knee: Secondary | ICD-10-CM | POA: Diagnosis not present

## 2022-06-25 ENCOUNTER — Ambulatory Visit: Payer: Medicare Other

## 2022-06-30 ENCOUNTER — Ambulatory Visit (INDEPENDENT_AMBULATORY_CARE_PROVIDER_SITE_OTHER): Payer: Medicare Other

## 2022-06-30 DIAGNOSIS — Z23 Encounter for immunization: Secondary | ICD-10-CM

## 2022-07-15 DIAGNOSIS — M79676 Pain in unspecified toe(s): Secondary | ICD-10-CM | POA: Diagnosis not present

## 2022-07-15 DIAGNOSIS — L84 Corns and callosities: Secondary | ICD-10-CM | POA: Diagnosis not present

## 2022-07-15 DIAGNOSIS — B351 Tinea unguium: Secondary | ICD-10-CM | POA: Diagnosis not present

## 2022-07-15 DIAGNOSIS — E1142 Type 2 diabetes mellitus with diabetic polyneuropathy: Secondary | ICD-10-CM | POA: Diagnosis not present

## 2022-07-28 ENCOUNTER — Telehealth: Payer: Self-pay | Admitting: Family Medicine

## 2022-07-28 ENCOUNTER — Other Ambulatory Visit: Payer: Self-pay | Admitting: Family Medicine

## 2022-07-28 DIAGNOSIS — E1169 Type 2 diabetes mellitus with other specified complication: Secondary | ICD-10-CM

## 2022-07-28 DIAGNOSIS — I10 Essential (primary) hypertension: Secondary | ICD-10-CM

## 2022-07-28 DIAGNOSIS — E782 Mixed hyperlipidemia: Secondary | ICD-10-CM

## 2022-07-28 NOTE — Telephone Encounter (Signed)
Pt coming in this week to do labs. Has appt with [facility] on 08/04/22.   Please add future lab order.

## 2022-07-28 NOTE — Telephone Encounter (Signed)
Orders placed.

## 2022-07-31 ENCOUNTER — Other Ambulatory Visit: Payer: Medicare Other

## 2022-07-31 DIAGNOSIS — E782 Mixed hyperlipidemia: Secondary | ICD-10-CM | POA: Diagnosis not present

## 2022-07-31 DIAGNOSIS — I10 Essential (primary) hypertension: Secondary | ICD-10-CM | POA: Diagnosis not present

## 2022-07-31 DIAGNOSIS — E1169 Type 2 diabetes mellitus with other specified complication: Secondary | ICD-10-CM

## 2022-07-31 LAB — BAYER DCA HB A1C WAIVED: HB A1C (BAYER DCA - WAIVED): 6 % — ABNORMAL HIGH (ref 4.8–5.6)

## 2022-08-01 DIAGNOSIS — M170 Bilateral primary osteoarthritis of knee: Secondary | ICD-10-CM | POA: Diagnosis not present

## 2022-08-01 LAB — CMP14+EGFR
ALT: 20 IU/L (ref 0–32)
AST: 31 IU/L (ref 0–40)
Albumin/Globulin Ratio: 1.5 (ref 1.2–2.2)
Albumin: 3.9 g/dL (ref 3.7–4.7)
Alkaline Phosphatase: 71 IU/L (ref 44–121)
BUN/Creatinine Ratio: 19 (ref 12–28)
BUN: 13 mg/dL (ref 8–27)
Bilirubin Total: 0.4 mg/dL (ref 0.0–1.2)
CO2: 24 mmol/L (ref 20–29)
Calcium: 9.2 mg/dL (ref 8.7–10.3)
Chloride: 96 mmol/L (ref 96–106)
Creatinine, Ser: 0.69 mg/dL (ref 0.57–1.00)
Globulin, Total: 2.6 g/dL (ref 1.5–4.5)
Glucose: 111 mg/dL — ABNORMAL HIGH (ref 70–99)
Potassium: 4.1 mmol/L (ref 3.5–5.2)
Sodium: 135 mmol/L (ref 134–144)
Total Protein: 6.5 g/dL (ref 6.0–8.5)
eGFR: 86 mL/min/{1.73_m2} (ref >59)

## 2022-08-01 LAB — CBC WITH DIFFERENTIAL/PLATELET
Basophils Absolute: 0 10*3/uL (ref 0.0–0.2)
Basos: 0 %
EOS (ABSOLUTE): 0 10*3/uL (ref 0.0–0.4)
Eos: 0 %
Hematocrit: 34.6 % (ref 34.0–46.6)
Hemoglobin: 11.8 g/dL (ref 11.1–15.9)
Immature Grans (Abs): 0 10*3/uL (ref 0.0–0.1)
Immature Granulocytes: 0 %
Lymphocytes Absolute: 1.4 10*3/uL (ref 0.7–3.1)
Lymphs: 20 %
MCH: 31.6 pg (ref 26.6–33.0)
MCHC: 34.1 g/dL (ref 31.5–35.7)
MCV: 93 fL (ref 79–97)
Monocytes Absolute: 1 10*3/uL — ABNORMAL HIGH (ref 0.1–0.9)
Monocytes: 15 %
Neutrophils Absolute: 4.5 10*3/uL (ref 1.4–7.0)
Neutrophils: 65 %
Platelets: 198 10*3/uL (ref 150–450)
RBC: 3.73 x10E6/uL — ABNORMAL LOW (ref 3.77–5.28)
RDW: 12.3 % (ref 11.7–15.4)
WBC: 6.9 10*3/uL (ref 3.4–10.8)

## 2022-08-01 LAB — LIPID PANEL
Chol/HDL Ratio: 2.4 ratio (ref 0.0–4.4)
Cholesterol, Total: 132 mg/dL (ref 100–199)
HDL: 56 mg/dL (ref >39)
LDL Chol Calc (NIH): 56 mg/dL (ref 0–99)
Triglycerides: 111 mg/dL (ref 0–149)
VLDL Cholesterol Cal: 20 mg/dL (ref 5–40)

## 2022-08-04 ENCOUNTER — Encounter: Payer: Self-pay | Admitting: Family Medicine

## 2022-08-04 ENCOUNTER — Ambulatory Visit (INDEPENDENT_AMBULATORY_CARE_PROVIDER_SITE_OTHER): Payer: Medicare Other | Admitting: Family Medicine

## 2022-08-04 DIAGNOSIS — E1169 Type 2 diabetes mellitus with other specified complication: Secondary | ICD-10-CM

## 2022-08-04 DIAGNOSIS — Z7984 Long term (current) use of oral hypoglycemic drugs: Secondary | ICD-10-CM

## 2022-08-04 DIAGNOSIS — E785 Hyperlipidemia, unspecified: Secondary | ICD-10-CM | POA: Diagnosis not present

## 2022-08-04 DIAGNOSIS — U071 COVID-19: Secondary | ICD-10-CM

## 2022-08-04 DIAGNOSIS — E7800 Pure hypercholesterolemia, unspecified: Secondary | ICD-10-CM

## 2022-08-04 NOTE — Progress Notes (Signed)
Virtual Visit via telephone Note  I connected with ARYAHI DENZLER on 08/04/22 at 0759 by telephone and verified that I am speaking with the correct person using two identifiers. MCKINZEY ENTWISTLE is currently located at home and patient are currently with her during visit. The provider, Fransisca Kaufmann Gerardine Peltz, MD is located in their office at time of visit.  Call ended at [phone]  I discussed the limitations, risks, security and privacy concerns of performing an evaluation and management service by telephone and the availability of in person appointments. I also discussed with the patient that there may be a patient responsible charge related to this service. The patient expressed understanding and agreed to proceed.   History and Present Illness: Patient is calling in for covid positive test on 6 days ago.  She started paxlovid on Saturday. She feels like she is getting improvement and cough is improved. She has had fevers, chills and body aches are improving and she denies SOB. She is also taking mucinex and it helps and she takes tylenol. She denies other sick contacts.   Type 2 diabetes mellitus Patient comes in today for recheck of his diabetes. Patient has been currently taking actos. Patient is currently on an ACE inhibitor/ARB. Patient has not seen an ophthalmologist this year. Patient denies any issues with their feet. The symptom started onset as an adult hyperlipidemia ARE RELATED TO DM   Hyperlipidemia Patient is coming in for recheck of his hyperlipidemia. The patient is currently taking fish oils and lipitor. They deny any issues with myalgias or history of liver damage from it. They deny any focal numbness or weakness or chest pain.   1. Pure hypercholesterolemia   2. Type 2 diabetes mellitus with other specified complication, without long-term current use of insulin [facility])     Outpatient Encounter Medications as of 08/04/2022  Medication Sig   amLODipine (NORVASC) 10 MG tablet TAKE  1 TABLET BY MOUTH EVERY DAY   aspirin EC 81 MG tablet Take 1 tablet (81 mg total) by mouth daily.   atorvastatin (LIPITOR) 40 MG tablet Take 1 tablet (40 mg total) by mouth at bedtime.   betamethasone dipropionate 0.05 % cream Apply topically.   Calcium Carb-Cholecalciferol 600-800 MG-UNIT TABS Take 1 tablet by mouth every evening.    cholecalciferol (VITAMIN D) 1000 UNITS tablet Take 2,000 Units by mouth daily.   CLINPRO 5000 1.1 % PSTE Place onto teeth 2 (two) times daily.   escitalopram (LEXAPRO) 20 MG tablet Take 1 tablet (20 mg total) by mouth daily.   esomeprazole (NEXIUM) 40 MG capsule Take 1 capsule (40 mg total) by mouth daily.   estradiol (ESTRACE) 0.1 MG/GM vaginal cream Place vaginally.   hydrochlorothiazide (HYDRODIURIL) 12.5 MG tablet Take 1 tablet (12.5 mg total) by mouth daily.   hydrocortisone 2.5 % cream PLEASE SEE ATTACHED FOR DETAILED DIRECTIONS   irbesartan (AVAPRO) 75 MG tablet Take 1 tablet (75 mg total) by mouth daily.   ketoconazole (NIZORAL) 2 % cream Apply topically 2 (two) times daily as needed.   metoprolol tartrate (LOPRESSOR) 25 MG tablet Take 0.5 tablets (12.5 mg total) by mouth 2 (two) times daily.   Multiple Vitamin (MULTIVITAMIN) tablet Take 1 tablet by mouth every morning.    omega-3 acid ethyl esters (LOVAZA) 1 g capsule Take 2 capsules (2 g total) by mouth 2 (two) times daily.   pioglitazone (ACTOS) 30 MG tablet Take 1 tablet (30 mg total) by mouth daily.   No facility-administered encounter medications  on file as of 08/04/2022.    Review of Systems  Constitutional:  Negative for chills and fever.  HENT:  Positive for congestion, postnasal drip, rhinorrhea and sinus pressure. Negative for ear discharge, ear pain, sneezing and sore throat.   Eyes:  Negative for pain, redness and visual disturbance.  Respiratory:  Positive for cough. Negative for chest tightness and shortness of breath.   Cardiovascular:  Negative for chest pain and leg swelling.   Genitourinary:  Negative for difficulty urinating and dysuria.  Musculoskeletal:  Negative for back pain and gait problem.  Skin:  Negative for rash.  Neurological:  Negative for light-headedness and headaches.  Psychiatric/Behavioral:  Negative for agitation and behavioral problems.   All other systems reviewed and are negative.   Observations/Objective: Patient sounds comfortable and in no acute distress.  Her blood work looked really good and her A1c was 6.0 and cholesterol greatly improved including triglycerides.  Assessment and Plan: Problem List Items Addressed This Visit       Endocrine   Type 2 diabetes mellitus with other specified complication ([place])     Other   Hyperlipidemia - Primary   Other Visit Diagnoses     COVID-19 virus infection            Follow up plan: Return in about 4 months (around 12/04/2022), or if symptoms worsen or fail to improve, for dm and hld .     I discussed the assessment and treatment plan with the patient. The patient was provided an opportunity to ask questions and all were answered. The patient agreed with the plan and demonstrated an understanding of the instructions.   The patient was advised to call back or seek an in-person evaluation if the symptoms worsen or if the condition fails to improve as anticipated.  The above assessment and management plan was discussed with the patient. The patient verbalized understanding of and has agreed to the management plan. Patient is aware to call the clinic if symptoms persist or worsen. Patient is aware when to return to the clinic for a follow-up visit. Patient educated on when it is appropriate to go to the emergency department.    I provided 8 minutes of non-face-to-face time during this encounter.    Worthy Rancher, MD

## 2022-08-08 DIAGNOSIS — M1711 Unilateral primary osteoarthritis, right knee: Secondary | ICD-10-CM | POA: Diagnosis not present

## 2022-08-14 DIAGNOSIS — H353131 Nonexudative age-related macular degeneration, bilateral, early dry stage: Secondary | ICD-10-CM | POA: Diagnosis not present

## 2022-08-14 DIAGNOSIS — H3581 Retinal edema: Secondary | ICD-10-CM | POA: Diagnosis not present

## 2022-08-14 DIAGNOSIS — M1711 Unilateral primary osteoarthritis, right knee: Secondary | ICD-10-CM | POA: Diagnosis not present

## 2022-08-14 DIAGNOSIS — H04123 Dry eye syndrome of bilateral lacrimal glands: Secondary | ICD-10-CM | POA: Diagnosis not present

## 2022-08-14 DIAGNOSIS — D3132 Benign neoplasm of left choroid: Secondary | ICD-10-CM | POA: Diagnosis not present

## 2022-08-14 DIAGNOSIS — D3131 Benign neoplasm of right choroid: Secondary | ICD-10-CM | POA: Diagnosis not present

## 2022-08-14 DIAGNOSIS — Z961 Presence of intraocular lens: Secondary | ICD-10-CM | POA: Diagnosis not present

## 2022-08-22 DIAGNOSIS — M1711 Unilateral primary osteoarthritis, right knee: Secondary | ICD-10-CM | POA: Diagnosis not present

## 2022-09-08 DIAGNOSIS — M9901 Segmental and somatic dysfunction of cervical region: Secondary | ICD-10-CM | POA: Diagnosis not present

## 2022-09-08 DIAGNOSIS — M9903 Segmental and somatic dysfunction of lumbar region: Secondary | ICD-10-CM | POA: Diagnosis not present

## 2022-09-08 DIAGNOSIS — M62830 Muscle spasm of back: Secondary | ICD-10-CM | POA: Diagnosis not present

## 2022-09-08 DIAGNOSIS — M9904 Segmental and somatic dysfunction of sacral region: Secondary | ICD-10-CM | POA: Diagnosis not present

## 2022-09-08 NOTE — Progress Notes (Signed)
HPI: FU palpitations and hypertension. Seen in the past secondary to hypertension, hyperlipidemia, and palpitations felt secondary to PVCs. She had a syncopal episode in April of 2010 that we felt was most likely vagal. An echocardiogram showed normal LV function. A Myoview showed an ejection fraction of 80% and normal perfusion. These were performed on February 20, 2009. Chest CT 4/15 showed coronary calcification. Since I last saw her, she denies dyspnea, chest pain, palpitations or syncope.  She has some fatigue.  She states she was told that her heart rate was in the 30s at one point but I have no verification at this point.  Current Outpatient Medications  Medication Sig Dispense Refill   amLODipine (NORVASC) 10 MG tablet TAKE 1 TABLET BY MOUTH EVERY DAY 90 tablet 3   aspirin EC 81 MG tablet Take 1 tablet (81 mg total) by mouth daily. 90 tablet 3   atorvastatin (LIPITOR) 40 MG tablet Take 1 tablet (40 mg total) by mouth at bedtime. 90 tablet 3   betamethasone dipropionate 0.05 % cream Apply topically.     Calcium Carb-Cholecalciferol 600-800 MG-UNIT TABS Take 1 tablet by mouth every evening.      cholecalciferol (VITAMIN D) 1000 UNITS tablet Take 2,000 Units by mouth daily.     CLINPRO 5000 1.1 % PSTE Place onto teeth 2 (two) times daily.     escitalopram (LEXAPRO) 20 MG tablet Take 1 tablet (20 mg total) by mouth daily. 90 tablet 3   esomeprazole (NEXIUM) 40 MG capsule Take 1 capsule (40 mg total) by mouth daily. 90 capsule 3   estradiol (ESTRACE) 0.1 MG/GM vaginal cream Place vaginally.     hydrochlorothiazide (HYDRODIURIL) 12.5 MG tablet Take 1 tablet (12.5 mg total) by mouth daily. 90 tablet 3   hydrocortisone 2.5 % cream PLEASE SEE ATTACHED FOR DETAILED DIRECTIONS     irbesartan (AVAPRO) 75 MG tablet Take 1 tablet (75 mg total) by mouth daily. 90 tablet 3   ketoconazole (NIZORAL) 2 % cream Apply topically 2 (two) times daily as needed.     metoprolol tartrate (LOPRESSOR) 25 MG tablet  Take 0.5 tablets (12.5 mg total) by mouth 2 (two) times daily. 90 tablet 3   Multiple Vitamin (MULTIVITAMIN) tablet Take 1 tablet by mouth every morning.      omega-3 acid ethyl esters (LOVAZA) 1 g capsule Take 2 capsules (2 g total) by mouth 2 (two) times daily. 360 capsule 1   pioglitazone (ACTOS) 30 MG tablet Take 1 tablet (30 mg total) by mouth daily. 90 tablet 3   No current facility-administered medications for this visit.     Past Medical History:  Diagnosis Date   Allergy    Anxiety    Arthritis    Bronchitis    Cataract    Colovaginal fistula    Depression    Diabetes mellitus without complication (HCC)    Dysrhythmia    palpitations, occasional PVCs; Dr Stanford Breed cardiologist   Esophageal stricture    Fractured elbow 1970's   GERD (gastroesophageal reflux disease)    H/O hiatal hernia    Hyperlipidemia    Hypertension    Osteopenia    Perimenopausal vasomotor symptoms    Postmenopausal HRT (hormone replacement therapy)    Pre-diabetes    Stress    Subacute lupus erythematosus    Dr. Allyson Sabal    Past Surgical History:  Procedure Laterality Date   abdominal bypass  1987   fibroids    ABDOMINAL HYSTERECTOMY  partial   bilateral tubal ligtation  1972   CHOLECYSTECTOMY N/A 03/21/2014   Procedure: LAPAROSCOPIC CHOLECYSTECTOMY WITH INTRAOPERATIVE CHOLANGIOGRAM;  Surgeon: Shann Medal, MD;  Location: [facility];  Service: General;  Laterality: N/A;   COLONOSCOPY     elbow Right 1976   surgery after fracture of elbow   EYE SURGERY Bilateral    cataracts   ROTATOR CUFF REPAIR Right 03/29/2018   TONSILLECTOMY     TUBAL LIGATION      Social History   Socioeconomic History   Marital status: Widowed    Spouse name: Not on file   Number of children: 4   Years of education: Not on file   Highest education level: Not on file  Occupational History   Occupation: retired     Comment: [employer]   Tobacco Use   Smoking status: Never   Smokeless tobacco:  Never  Vaping Use   Vaping Use: Never used  Substance and Sexual Activity   Alcohol use: No    Alcohol/week: 0.0 standard drinks of alcohol   Drug use: No   Sexual activity: Never  Other Topics Concern   Not on file  Social History Narrative   Lives alone - son next door    Daughters in [place], one in [place], one in [place]   Social Determinants of Health   Financial Resource Strain: [place]  (06/03/2021)   Overall Financial Resource Strain (CARDIA)    Difficulty of Paying Living Expenses: Not hard at all  Food Insecurity: No Food Insecurity (06/03/2021)   Hunger Vital Sign    Worried About Running Out of Food in the Last Year: Never true    [place] in the Last Year: Never true  Transportation Needs: No Transportation Needs (06/03/2021)   PRAPARE - Hydrologist (Medical): No    Lack of Transportation (Non-Medical): No  Physical Activity: Inactive (06/03/2021)   Exercise Vital Sign    Days of Exercise per Week: 0 days    Minutes of Exercise per Session: 0 min  Stress: No Stress Concern Present (06/03/2021)   [place]    Feeling of Stress : Only a little  Social Connections: Moderately Integrated (06/03/2021)   Social Connection and Isolation Panel [NHANES]    Frequency of Communication with Friends and Family: More than three times a week    Frequency of Social Gatherings with Friends and Family: More than three times a week    Attends Religious Services: More than 4 times per year    Active Member of [employer] or Organizations: Yes    Attends Archivist Meetings: 1 to 4 times per year    Marital Status: Widowed  Intimate Partner Violence: Not At Risk (06/03/2021)   Humiliation, Afraid, Rape, and Kick questionnaire    Fear of Current or Ex-Partner: No    Emotionally Abused: No    Physically Abused: No    Sexually Abused: No    Family History  Problem Relation  Age of Onset   Cancer Mother        laryngeal    Hypertension Mother    Heart disease Mother    Hypertension Father    Coronary artery disease Father    Kidney disease Father        kidney failure    Breast cancer Sister    Hypertension Sister    Cancer Sister  breast   Heart attack Sister    Heart disease Sister    Stroke Sister    Stroke Sister    Hypertension Sister    Hyperlipidemia Sister    Hypertension Daughter    Hypertension Brother    Cancer Brother        liver   Hypertension Brother    Heart attack Brother    Hypertension Son     ROS: no fevers or chills, productive cough, hemoptysis, dysphasia, odynophagia, melena, hematochezia, dysuria, hematuria, rash, seizure activity, orthopnea, PND, pedal edema, claudication. Remaining systems are negative.  Physical Exam: Well-developed well-nourished in no acute distress.  Skin is warm and dry.  HEENT is normal.  Neck is supple.  Chest is clear to auscultation with normal expansion.  Cardiovascular exam is regular rate and rhythm.  Abdominal exam nontender or distended. No masses palpated. Extremities show no edema. neuro grossly intact  ECG-sinus bradycardia, no ST changes.  Personally reviewed  A/P  1 coronary artery disease-noted to have coronary calcification on previous CT.  She does not have chest pain.  Continue aspirin and statin.  2 palpitations-symptoms are controlled at present.  Continue metoprolol at present dose.  There was a question of whether she had some bradycardia though her heart rate is in the 50s today.  We can consider a monitor in the future if necessary.  3 hypertension-blood pressure controlled.  Continue present medical regimen.  4 hyperlipidemia-continue statin.  Kirk Ruths, MD

## 2022-09-19 ENCOUNTER — Ambulatory Visit: Payer: Medicare Other | Attending: Cardiology | Admitting: Cardiology

## 2022-09-19 ENCOUNTER — Encounter: Payer: Self-pay | Admitting: Cardiology

## 2022-09-19 VITALS — BP 132/68 | HR 53 | Ht 59.0 in | Wt 163.4 lb

## 2022-09-19 DIAGNOSIS — E782 Mixed hyperlipidemia: Secondary | ICD-10-CM | POA: Diagnosis not present

## 2022-09-19 DIAGNOSIS — I2510 Atherosclerotic heart disease of native coronary artery without angina pectoris: Secondary | ICD-10-CM | POA: Insufficient documentation

## 2022-09-19 DIAGNOSIS — I10 Essential (primary) hypertension: Secondary | ICD-10-CM | POA: Diagnosis not present

## 2022-09-19 DIAGNOSIS — R002 Palpitations: Secondary | ICD-10-CM | POA: Diagnosis not present

## 2022-09-19 NOTE — Patient Instructions (Signed)
Medication Instructions:  No changes *If you need a refill on your cardiac medications before your next appointment, please call your pharmacy*   Follow-Up: At [facility], you and your health needs are our priority.  As part of our continuing mission to provide you with exceptional heart care, we have created designated Provider Care Teams.  These Care Teams include your primary Cardiologist (physician) and Advanced Practice Providers (APPs -  Physician Assistants and Nurse Practitioners) who all work together to provide you with the care you need, when you need it.  We recommend signing up for the patient portal called "MyChart".  Sign up information is provided on this After Visit Summary.  MyChart is used to connect with patients for Virtual Visits (Telemedicine).  Patients are able to view lab/test results, encounter notes, upcoming appointments, etc.  Non-urgent messages can be sent to your provider as well.   To learn more about what you can do with MyChart, go to [email or web address].    Your next appointment:   1 year(s)  Provider:   Kirk Ruths, MD

## 2022-09-23 DIAGNOSIS — M62830 Muscle spasm of back: Secondary | ICD-10-CM | POA: Diagnosis not present

## 2022-09-23 DIAGNOSIS — M9901 Segmental and somatic dysfunction of cervical region: Secondary | ICD-10-CM | POA: Diagnosis not present

## 2022-09-23 DIAGNOSIS — M9904 Segmental and somatic dysfunction of sacral region: Secondary | ICD-10-CM | POA: Diagnosis not present

## 2022-09-23 DIAGNOSIS — M79676 Pain in unspecified toe(s): Secondary | ICD-10-CM | POA: Diagnosis not present

## 2022-09-23 DIAGNOSIS — B351 Tinea unguium: Secondary | ICD-10-CM | POA: Diagnosis not present

## 2022-09-23 DIAGNOSIS — E1142 Type 2 diabetes mellitus with diabetic polyneuropathy: Secondary | ICD-10-CM | POA: Diagnosis not present

## 2022-09-23 DIAGNOSIS — L84 Corns and callosities: Secondary | ICD-10-CM | POA: Diagnosis not present

## 2022-09-23 DIAGNOSIS — M9903 Segmental and somatic dysfunction of lumbar region: Secondary | ICD-10-CM | POA: Diagnosis not present

## 2022-09-25 DIAGNOSIS — L820 Inflamed seborrheic keratosis: Secondary | ICD-10-CM | POA: Diagnosis not present

## 2022-09-25 DIAGNOSIS — H353111 Nonexudative age-related macular degeneration, right eye, early dry stage: Secondary | ICD-10-CM | POA: Diagnosis not present

## 2022-09-25 DIAGNOSIS — D225 Melanocytic nevi of trunk: Secondary | ICD-10-CM | POA: Diagnosis not present

## 2022-09-25 DIAGNOSIS — H35352 Cystoid macular degeneration, left eye: Secondary | ICD-10-CM | POA: Diagnosis not present

## 2022-09-25 DIAGNOSIS — L821 Other seborrheic keratosis: Secondary | ICD-10-CM | POA: Diagnosis not present

## 2022-09-25 DIAGNOSIS — D485 Neoplasm of uncertain behavior of skin: Secondary | ICD-10-CM | POA: Diagnosis not present

## 2022-09-25 DIAGNOSIS — H353123 Nonexudative age-related macular degeneration, left eye, advanced atrophic without subfoveal involvement: Secondary | ICD-10-CM | POA: Diagnosis not present

## 2022-09-25 DIAGNOSIS — L281 Prurigo nodularis: Secondary | ICD-10-CM | POA: Diagnosis not present

## 2022-09-25 DIAGNOSIS — L298 Other pruritus: Secondary | ICD-10-CM | POA: Diagnosis not present

## 2022-09-25 DIAGNOSIS — M25511 Pain in right shoulder: Secondary | ICD-10-CM | POA: Diagnosis not present

## 2022-09-25 DIAGNOSIS — H35072 Retinal telangiectasis, left eye: Secondary | ICD-10-CM | POA: Diagnosis not present

## 2022-09-25 DIAGNOSIS — L814 Other melanin hyperpigmentation: Secondary | ICD-10-CM | POA: Diagnosis not present

## 2022-09-25 DIAGNOSIS — L538 Other specified erythematous conditions: Secondary | ICD-10-CM | POA: Diagnosis not present

## 2022-10-06 DIAGNOSIS — M9901 Segmental and somatic dysfunction of cervical region: Secondary | ICD-10-CM | POA: Diagnosis not present

## 2022-10-06 DIAGNOSIS — M9903 Segmental and somatic dysfunction of lumbar region: Secondary | ICD-10-CM | POA: Diagnosis not present

## 2022-10-06 DIAGNOSIS — M9904 Segmental and somatic dysfunction of sacral region: Secondary | ICD-10-CM | POA: Diagnosis not present

## 2022-10-06 DIAGNOSIS — M62830 Muscle spasm of back: Secondary | ICD-10-CM | POA: Diagnosis not present

## 2022-10-20 DIAGNOSIS — M9904 Segmental and somatic dysfunction of sacral region: Secondary | ICD-10-CM | POA: Diagnosis not present

## 2022-10-20 DIAGNOSIS — M9903 Segmental and somatic dysfunction of lumbar region: Secondary | ICD-10-CM | POA: Diagnosis not present

## 2022-10-20 DIAGNOSIS — M62830 Muscle spasm of back: Secondary | ICD-10-CM | POA: Diagnosis not present

## 2022-10-20 DIAGNOSIS — M9901 Segmental and somatic dysfunction of cervical region: Secondary | ICD-10-CM | POA: Diagnosis not present

## 2022-11-10 DIAGNOSIS — M9904 Segmental and somatic dysfunction of sacral region: Secondary | ICD-10-CM | POA: Diagnosis not present

## 2022-11-10 DIAGNOSIS — M9901 Segmental and somatic dysfunction of cervical region: Secondary | ICD-10-CM | POA: Diagnosis not present

## 2022-11-10 DIAGNOSIS — M62830 Muscle spasm of back: Secondary | ICD-10-CM | POA: Diagnosis not present

## 2022-11-10 DIAGNOSIS — M9903 Segmental and somatic dysfunction of lumbar region: Secondary | ICD-10-CM | POA: Diagnosis not present

## 2022-11-23 ENCOUNTER — Other Ambulatory Visit: Payer: Self-pay | Admitting: Cardiology

## 2022-11-24 DIAGNOSIS — M9904 Segmental and somatic dysfunction of sacral region: Secondary | ICD-10-CM | POA: Diagnosis not present

## 2022-11-24 DIAGNOSIS — M9901 Segmental and somatic dysfunction of cervical region: Secondary | ICD-10-CM | POA: Diagnosis not present

## 2022-11-24 DIAGNOSIS — M62830 Muscle spasm of back: Secondary | ICD-10-CM | POA: Diagnosis not present

## 2022-11-24 DIAGNOSIS — M9903 Segmental and somatic dysfunction of lumbar region: Secondary | ICD-10-CM | POA: Diagnosis not present

## 2022-12-01 ENCOUNTER — Telehealth: Payer: Self-pay | Admitting: Family Medicine

## 2022-12-01 DIAGNOSIS — E1169 Type 2 diabetes mellitus with other specified complication: Secondary | ICD-10-CM

## 2022-12-01 NOTE — Telephone Encounter (Signed)
Placed lab orders for patient to come in and get them done.

## 2022-12-02 ENCOUNTER — Other Ambulatory Visit: Payer: Medicare Other

## 2022-12-02 DIAGNOSIS — L84 Corns and callosities: Secondary | ICD-10-CM | POA: Diagnosis not present

## 2022-12-02 DIAGNOSIS — E1169 Type 2 diabetes mellitus with other specified complication: Secondary | ICD-10-CM | POA: Diagnosis not present

## 2022-12-02 DIAGNOSIS — M79676 Pain in unspecified toe(s): Secondary | ICD-10-CM | POA: Diagnosis not present

## 2022-12-02 DIAGNOSIS — B351 Tinea unguium: Secondary | ICD-10-CM | POA: Diagnosis not present

## 2022-12-02 DIAGNOSIS — E1142 Type 2 diabetes mellitus with diabetic polyneuropathy: Secondary | ICD-10-CM | POA: Diagnosis not present

## 2022-12-02 LAB — BAYER DCA HB A1C WAIVED: HB A1C (BAYER DCA - WAIVED): 6 % — ABNORMAL HIGH (ref 4.8–5.6)

## 2022-12-03 LAB — BMP8+EGFR
BUN/Creatinine Ratio: 19 (ref 12–28)
BUN: 15 mg/dL (ref 8–27)
CO2: 23 mmol/L (ref 20–29)
Calcium: 9.9 mg/dL (ref 8.7–10.3)
Chloride: 98 mmol/L (ref 96–106)
Creatinine, Ser: 0.77 mg/dL (ref 0.57–1.00)
Glucose: 104 mg/dL — ABNORMAL HIGH (ref 70–99)
Potassium: 4.8 mmol/L (ref 3.5–5.2)
Sodium: 138 mmol/L (ref 134–144)
eGFR: 76 mL/min/{1.73_m2} (ref >59)

## 2022-12-05 ENCOUNTER — Encounter: Payer: Self-pay | Admitting: Family Medicine

## 2022-12-05 ENCOUNTER — Other Ambulatory Visit: Payer: Self-pay | Admitting: Family Medicine

## 2022-12-05 ENCOUNTER — Ambulatory Visit (INDEPENDENT_AMBULATORY_CARE_PROVIDER_SITE_OTHER): Payer: Medicare Other | Admitting: Family Medicine

## 2022-12-05 VITALS — BP 129/70 | HR 59 | Ht 59.0 in | Wt 162.0 lb

## 2022-12-05 DIAGNOSIS — M8580 Other specified disorders of bone density and structure, unspecified site: Secondary | ICD-10-CM

## 2022-12-05 DIAGNOSIS — E1159 Type 2 diabetes mellitus with other circulatory complications: Secondary | ICD-10-CM

## 2022-12-05 DIAGNOSIS — I10 Essential (primary) hypertension: Secondary | ICD-10-CM

## 2022-12-05 DIAGNOSIS — Z780 Asymptomatic menopausal state: Secondary | ICD-10-CM

## 2022-12-05 DIAGNOSIS — I152 Hypertension secondary to endocrine disorders: Secondary | ICD-10-CM

## 2022-12-05 DIAGNOSIS — E7800 Pure hypercholesterolemia, unspecified: Secondary | ICD-10-CM

## 2022-12-05 DIAGNOSIS — E1169 Type 2 diabetes mellitus with other specified complication: Secondary | ICD-10-CM

## 2022-12-05 NOTE — Progress Notes (Signed)
BP 129/70   Pulse (!) 59   Ht 4\' 11"  (1.499 m)   Wt 162 lb (73.5 kg)   SpO2 96%   BMI 32.72 kg/m    Subjective:   Patient ID: Misty EarlsBarbara [name], female    DOB: [birth year], 82 y.o.   MRN: [id number]  HPI: Misty Smith is a 82 y.o. female presenting on 12/05/2022 for Medical Management of Chronic Issues, Hyperlipidemia, and Prediabetes   HPI Hypertension Patient is currently on amlodipine and metoprolol and hydrochlorothiazide losartan, and their blood pressure today is 129/70. Patient denies any lightheadedness or dizziness. Patient denies headaches, blurred vision, chest pains, shortness of breath, or weakness. Denies any side effects from medication and is content with current medication.   Hyperlipidemia Patient is coming in for recheck of his hyperlipidemia. The patient is currently taking fish oil and atorvastatin. They deny any issues with myalgias or history of liver damage from it. They deny any focal numbness or weakness or chest pain.   Type 2 diabetes mellitus Patient comes in today for recheck of his diabetes. Patient has been currently taking Actos. Patient is currently on an ACE inhibitor/ARB. Patient has not seen an ophthalmologist this year. Patient denies any issues with their feet. The symptom started onset as an adult hypertension and hyperlipidemia and CAD ARE RELATED TO DM   Relevant past medical, surgical, family and social history reviewed and updated as indicated. Interim medical history since our last visit reviewed. Allergies and medications reviewed and updated.  Review of Systems  Constitutional:  Negative for chills and fever.  Eyes:  Negative for visual disturbance.  Respiratory:  Negative for chest tightness and shortness of breath.   Cardiovascular:  Negative for chest pain and leg swelling.  Musculoskeletal:  Negative for back pain and gait problem.  Skin:  Negative for rash.  Neurological:  Negative for dizziness, light-headedness and headaches.   Psychiatric/Behavioral:  Negative for agitation and behavioral problems.   All other systems reviewed and are negative.   Per HPI unless specifically indicated above   Allergies as of 12/05/2022       Reactions   Ciprofloxacin Rash   Codeine Rash   Iohexol     Desc: PT HAD HIVES AND WAS GIVEN BENEDRYL PO BY NURSE AND OBSERVED IN NURSES STATION AND RELEASED WITHOUT AND OTHER COMPLICATIONS SHEET SCANNED UNDER NURSES NOTES///ON 12/01/11 PT HAD ANOTHER CT WITH PREMEDS MINUS THE BENADRYL. SHE HAD A DELAYED REACTION AFTER WITH THROAT TIGHTNESS AND SOB, JB  12/07/12   Penicillins Hives   Sulfonamide Derivatives Hives   Asa [aspirin] Other (See Comments)   Large amounts causes stomach pain   Keflex [cephalexin] Nausea And Vomiting   Morphine And Related Rash        Medication List        Accurate as of December 05, 2022  9:56 AM. If you have any questions, ask your nurse or doctor.          amLODipine 10 MG tablet Commonly known as: NORVASC TAKE 1 TABLET BY MOUTH EVERY DAY   aspirin EC 81 MG tablet Take 1 tablet (81 mg total) by mouth daily.   atorvastatin 40 MG tablet Commonly known as: LIPITOR TAKE 1 TABLET BY MOUTH EVERYDAY AT BEDTIME   betamethasone dipropionate 0.05 % cream Apply topically.   Calcium Carb-Cholecalciferol 600-800 MG-UNIT Tabs Take 1 tablet by mouth every evening.   cholecalciferol 1000 units tablet Commonly known as: VITAMIN D Take 2,000 Units by mouth daily.  Clinpro 5000 1.1 % Pste Generic drug: Sodium Fluoride Place onto teeth 2 (two) times daily.   escitalopram 20 MG tablet Commonly known as: LEXAPRO Take 1 tablet (20 mg total) by mouth daily.   esomeprazole 40 MG capsule Commonly known as: NEXIUM Take 1 capsule (40 mg total) by mouth daily.   estradiol 0.1 MG/GM vaginal cream Commonly known as: ESTRACE Place vaginally.   hydrochlorothiazide 12.5 MG tablet Commonly known as: HYDRODIURIL Take 1 tablet (12.5 mg total) by mouth daily.    hydrocortisone 2.5 % cream PLEASE SEE ATTACHED FOR DETAILED DIRECTIONS   irbesartan 75 MG tablet Commonly known as: AVAPRO TAKE 1 TABLET BY MOUTH EVERY DAY   ketoconazole 2 % cream Commonly known as: NIZORAL Apply topically 2 (two) times daily as needed.   metoprolol tartrate 25 MG tablet Commonly known as: LOPRESSOR Take 0.5 tablets (12.5 mg total) by mouth 2 (two) times daily.   multivitamin tablet Take 1 tablet by mouth every morning.   omega-3 acid ethyl esters 1 g capsule Commonly known as: LOVAZA Take 2 capsules (2 g total) by mouth 2 (two) times daily.   pioglitazone 30 MG tablet Commonly known as: ACTOS Take 1 tablet (30 mg total) by mouth daily.         Objective:   BP 129/70   Pulse (!) 59   Ht 4\' 11"  (1.499 m)   Wt 162 lb (73.5 kg)   SpO2 96%   BMI 32.72 kg/m   Wt Readings from Last 3 Encounters:  12/05/22 162 lb (73.5 kg)  09/19/22 163 lb 6.4 oz (74.1 kg)  04/04/22 166 lb (75.3 kg)    Physical Exam Vitals and nursing note reviewed.  Constitutional:      General: She is not in acute distress.    Appearance: She is well-developed. She is not diaphoretic.  Eyes:     Conjunctiva/sclera: Conjunctivae normal.  Cardiovascular:     Rate and Rhythm: Normal rate and regular rhythm.     Heart sounds: Normal heart sounds. No murmur heard. Pulmonary:     Effort: Pulmonary effort is normal. No respiratory distress.     Breath sounds: Normal breath sounds. No wheezing.  Musculoskeletal:        General: No swelling or tenderness. Normal range of motion.  Skin:    General: Skin is warm and dry.     Findings: No rash.  Neurological:     Mental Status: She is alert and oriented to person, place, and time.     Coordination: Coordination normal.  Psychiatric:        Behavior: Behavior normal.     Results for orders placed or performed in visit on 12/02/22  [facility]  Result Value Ref Range   Glucose 104 (H) 70 - 99 mg/dL   BUN 15 8 - 27 mg/dL    Creatinine, Ser 6.210.77 0.57 - 1.00 mg/dL   eGFR 76 >30>59 QM/VHQ/4.69mL/min/1.73   BUN/Creatinine Ratio 19 12 - 28   Sodium 138 134 - 144 mmol/L   Potassium 4.8 3.5 - 5.2 mmol/L   Chloride 98 96 - 106 mmol/L   CO2 23 20 - 29 mmol/L   Calcium 9.9 8.7 - 10.3 mg/dL  Bayer DCA Hb G2XA1c Waived  Result Value Ref Range   HB A1C (BAYER DCA - WAIVED) 6.0 (H) 4.8 - 5.6 %    Assessment & Plan:   Problem List Items Addressed This Visit       Cardiovascular and Mediastinum  Essential hypertension     Endocrine   Type 2 diabetes mellitus with other specified complication   Relevant Orders   Microalbumin / creatinine urine ratio     Musculoskeletal and Integument   Osteopenia of the elderly   Relevant Orders   DG [facility] DEXA     Other   Hyperlipidemia - Primary    A1c looks good at 6.0, blood work looked good the other day when she had it.  Blood pressure looks good.  No changes She feels a little sleepy with the metoprolol that they are already cutting in half and they debated whether she dislikes the sleepiness or palpitations that it was helping with.  She will think about this some more. Follow up plan: Return in about 4 months (around 04/06/2023), or if symptoms worsen or fail to improve, for Hypertension and hyperlipidemia and diabetes..  Counseling provided for all of the vaccine components Orders Placed This Encounter  Procedures   DG [facility] DEXA   Microalbumin / creatinine urine ratio    Arville Care, MD [facility] 12/05/2022, 9:56 AM

## 2022-12-08 ENCOUNTER — Ambulatory Visit (INDEPENDENT_AMBULATORY_CARE_PROVIDER_SITE_OTHER): Payer: Medicare Other

## 2022-12-08 DIAGNOSIS — M85852 Other specified disorders of bone density and structure, left thigh: Secondary | ICD-10-CM | POA: Diagnosis not present

## 2022-12-08 DIAGNOSIS — M62830 Muscle spasm of back: Secondary | ICD-10-CM | POA: Diagnosis not present

## 2022-12-08 DIAGNOSIS — M9903 Segmental and somatic dysfunction of lumbar region: Secondary | ICD-10-CM | POA: Diagnosis not present

## 2022-12-08 DIAGNOSIS — M9901 Segmental and somatic dysfunction of cervical region: Secondary | ICD-10-CM | POA: Diagnosis not present

## 2022-12-08 DIAGNOSIS — M9904 Segmental and somatic dysfunction of sacral region: Secondary | ICD-10-CM | POA: Diagnosis not present

## 2022-12-08 DIAGNOSIS — M8580 Other specified disorders of bone density and structure, unspecified site: Secondary | ICD-10-CM

## 2022-12-08 DIAGNOSIS — M8588 Other specified disorders of bone density and structure, other site: Secondary | ICD-10-CM

## 2022-12-08 DIAGNOSIS — Z780 Asymptomatic menopausal state: Secondary | ICD-10-CM | POA: Diagnosis not present

## 2022-12-11 DIAGNOSIS — H35352 Cystoid macular degeneration, left eye: Secondary | ICD-10-CM | POA: Diagnosis not present

## 2022-12-11 DIAGNOSIS — H353111 Nonexudative age-related macular degeneration, right eye, early dry stage: Secondary | ICD-10-CM | POA: Diagnosis not present

## 2022-12-11 DIAGNOSIS — H353123 Nonexudative age-related macular degeneration, left eye, advanced atrophic without subfoveal involvement: Secondary | ICD-10-CM | POA: Diagnosis not present

## 2022-12-11 DIAGNOSIS — H35072 Retinal telangiectasis, left eye: Secondary | ICD-10-CM | POA: Diagnosis not present

## 2022-12-11 DIAGNOSIS — M25511 Pain in right shoulder: Secondary | ICD-10-CM | POA: Diagnosis not present

## 2022-12-11 LAB — HM DIABETES EYE EXAM

## 2022-12-22 DIAGNOSIS — M9901 Segmental and somatic dysfunction of cervical region: Secondary | ICD-10-CM | POA: Diagnosis not present

## 2022-12-22 DIAGNOSIS — M9903 Segmental and somatic dysfunction of lumbar region: Secondary | ICD-10-CM | POA: Diagnosis not present

## 2022-12-22 DIAGNOSIS — M9904 Segmental and somatic dysfunction of sacral region: Secondary | ICD-10-CM | POA: Diagnosis not present

## 2022-12-22 DIAGNOSIS — M62830 Muscle spasm of back: Secondary | ICD-10-CM | POA: Diagnosis not present

## 2022-12-26 ENCOUNTER — Other Ambulatory Visit: Payer: Self-pay | Admitting: Family Medicine

## 2022-12-26 DIAGNOSIS — E7800 Pure hypercholesterolemia, unspecified: Secondary | ICD-10-CM

## 2022-12-30 ENCOUNTER — Other Ambulatory Visit: Payer: Self-pay | Admitting: Family Medicine

## 2022-12-30 DIAGNOSIS — E7800 Pure hypercholesterolemia, unspecified: Secondary | ICD-10-CM

## 2022-12-30 NOTE — Telephone Encounter (Signed)
Pt called to check on status of this Rx request. Please advise and call patient.

## 2022-12-31 MED ORDER — OMEGA-3-ACID ETHYL ESTERS 1 G PO CAPS
ORAL_CAPSULE | ORAL | 1 refills | Status: DC
Start: 2022-12-31 — End: 2023-12-07

## 2022-12-31 NOTE — Addendum Note (Signed)
Addended by: Julious Payer D on: 12/31/2022 07:51 AM   Modules accepted: Orders

## 2022-12-31 NOTE — Telephone Encounter (Signed)
Refill failed, resent 

## 2022-12-31 NOTE — Telephone Encounter (Signed)
Aware refill sent to pharmacy ?

## 2023-01-06 DIAGNOSIS — M62830 Muscle spasm of back: Secondary | ICD-10-CM | POA: Diagnosis not present

## 2023-01-06 DIAGNOSIS — M9901 Segmental and somatic dysfunction of cervical region: Secondary | ICD-10-CM | POA: Diagnosis not present

## 2023-01-06 DIAGNOSIS — M9904 Segmental and somatic dysfunction of sacral region: Secondary | ICD-10-CM | POA: Diagnosis not present

## 2023-01-06 DIAGNOSIS — M9903 Segmental and somatic dysfunction of lumbar region: Secondary | ICD-10-CM | POA: Diagnosis not present

## 2023-01-13 ENCOUNTER — Telehealth: Payer: Self-pay | Admitting: Family Medicine

## 2023-01-13 NOTE — Telephone Encounter (Signed)
Contacted Misty Smith to schedule their annual wellness visit. Appointment made for 02/03/2023.   Thank you,  [name],  AMB Clinical Support [facility] AWV Program Direct Dial ??[phone]

## 2023-01-19 DIAGNOSIS — M62830 Muscle spasm of back: Secondary | ICD-10-CM | POA: Diagnosis not present

## 2023-01-19 DIAGNOSIS — M9904 Segmental and somatic dysfunction of sacral region: Secondary | ICD-10-CM | POA: Diagnosis not present

## 2023-01-19 DIAGNOSIS — M9901 Segmental and somatic dysfunction of cervical region: Secondary | ICD-10-CM | POA: Diagnosis not present

## 2023-01-19 DIAGNOSIS — M9903 Segmental and somatic dysfunction of lumbar region: Secondary | ICD-10-CM | POA: Diagnosis not present

## 2023-02-02 DIAGNOSIS — M62830 Muscle spasm of back: Secondary | ICD-10-CM | POA: Diagnosis not present

## 2023-02-02 DIAGNOSIS — M9903 Segmental and somatic dysfunction of lumbar region: Secondary | ICD-10-CM | POA: Diagnosis not present

## 2023-02-02 DIAGNOSIS — M9901 Segmental and somatic dysfunction of cervical region: Secondary | ICD-10-CM | POA: Diagnosis not present

## 2023-02-02 DIAGNOSIS — M9904 Segmental and somatic dysfunction of sacral region: Secondary | ICD-10-CM | POA: Diagnosis not present

## 2023-02-03 ENCOUNTER — Ambulatory Visit (INDEPENDENT_AMBULATORY_CARE_PROVIDER_SITE_OTHER): Payer: Medicare Other

## 2023-02-03 VITALS — Ht 60.0 in | Wt 160.0 lb

## 2023-02-03 DIAGNOSIS — Z0000 Encounter for general adult medical examination without abnormal findings: Secondary | ICD-10-CM | POA: Diagnosis not present

## 2023-02-03 NOTE — Progress Notes (Signed)
Subjective:   Misty Smith is a 82 y.o. female who presents for Medicare Annual (Subsequent) preventive examination. I connected with  LAVINA PENLAND on 02/03/23 by a audio enabled telemedicine application and verified that I am speaking with the correct person using two identifiers.  Patient Location: Home  Provider Location: Home Office  I discussed the limitations of evaluation and management by telemedicine. The patient expressed understanding and agreed to proceed.  Review of Systems     Cardiac Risk Factors include: advanced age (>22men, >32 women);diabetes mellitus;dyslipidemia;hypertension     Objective:    Today's Vitals   02/03/23 1448  Weight: 160 lb (72.6 kg)  Height: 5' (1.524 m)   Body mass index is 31.25 kg/m.     02/03/2023    2:51 PM 06/18/2021    3:36 PM 06/03/2021    8:46 AM 06/29/2018    9:19 AM 04/16/2018   10:47 AM 04/29/2017    9:48 AM 04/22/2016    8:59 AM  Advanced Directives  Does Patient Have a Medical Advance Directive? Yes Yes Yes Yes No Yes Yes  Type of Estate agent of [place];Living will  Healthcare Power of [place];Living will Healthcare Power of [place];Living will  Healthcare Power of [place];Living will Healthcare Power of [place];Living will  Does patient want to make changes to medical advance directive?    No - Patient declined  No - Patient declined No - Patient declined  Copy of Healthcare Power of Attorney in Chart? No - copy requested  No - copy requested No - copy requested  No - copy requested No - copy requested  Would patient like information on creating a medical advance directive?     No - Patient declined      Current Medications (verified) Outpatient Encounter Medications as of 02/03/2023  Medication Sig   amLODipine (NORVASC) 10 MG tablet TAKE 1 TABLET BY MOUTH EVERY DAY   aspirin EC 81 MG tablet Take 1 tablet (81 mg total) by mouth daily.   atorvastatin (LIPITOR) 40 MG tablet TAKE 1 TABLET BY  MOUTH EVERYDAY AT BEDTIME   betamethasone dipropionate 0.05 % cream Apply topically.   Calcium Carb-Cholecalciferol 600-800 MG-UNIT TABS Take 1 tablet by mouth every evening.    cholecalciferol (VITAMIN D) 1000 UNITS tablet Take 2,000 Units by mouth daily.   CLINPRO 5000 1.1 % PSTE Place onto teeth 2 (two) times daily.   escitalopram (LEXAPRO) 20 MG tablet Take 1 tablet (20 mg total) by mouth daily.   esomeprazole (NEXIUM) 40 MG capsule Take 1 capsule (40 mg total) by mouth daily.   estradiol (ESTRACE) 0.1 MG/GM vaginal cream Place vaginally.   hydrochlorothiazide (HYDRODIURIL) 12.5 MG tablet Take 1 tablet (12.5 mg total) by mouth daily.   hydrocortisone 2.5 % cream PLEASE SEE ATTACHED FOR DETAILED DIRECTIONS   irbesartan (AVAPRO) 75 MG tablet TAKE 1 TABLET BY MOUTH EVERY DAY   ketoconazole (NIZORAL) 2 % cream Apply topically 2 (two) times daily as needed.   metoprolol tartrate (LOPRESSOR) 25 MG tablet Take 0.5 tablets (12.5 mg total) by mouth 2 (two) times daily.   Multiple Vitamin (MULTIVITAMIN) tablet Take 1 tablet by mouth every morning.    omega-3 acid ethyl esters (LOVAZA) 1 g capsule TAKE 2 CAPSULES BY MOUTH 2 TIMES DAILY.   pioglitazone (ACTOS) 30 MG tablet Take 1 tablet (30 mg total) by mouth daily.   No facility-administered encounter medications on file as of 02/03/2023.    Allergies (verified) Ciprofloxacin, Codeine, Iohexol,  Penicillins, Sulfonamide derivatives, Asa [aspirin], Keflex [cephalexin], and Morphine and codeine   History: Past Medical History:  Diagnosis Date   Allergy    Anxiety    Arthritis    Bronchitis    Cataract    Colovaginal fistula    Depression    Diabetes mellitus without complication (HCC)    Dysrhythmia    palpitations, occasional PVCs; Dr Jens Som cardiologist   Esophageal stricture    Fractured elbow 1970's   GERD (gastroesophageal reflux disease)    H/O hiatal hernia    Hyperlipidemia    Hypertension    Osteopenia    Perimenopausal  vasomotor symptoms    Postmenopausal HRT (hormone replacement therapy)    Pre-diabetes    Stress    Subacute lupus erythematosus    Dr. Terri Piedra   Past Surgical History:  Procedure Laterality Date   abdominal bypass  1987   fibroids    ABDOMINAL HYSTERECTOMY     partial   bilateral tubal ligtation  1972   CHOLECYSTECTOMY N/A 03/21/2014   Procedure: LAPAROSCOPIC CHOLECYSTECTOMY WITH INTRAOPERATIVE CHOLANGIOGRAM;  Surgeon: Kandis Cocking, MD;  Location: [facility] OR;  Service: General;  Laterality: N/A;   COLONOSCOPY     elbow Right 1976   surgery after fracture of elbow   EYE SURGERY Bilateral    cataracts   ROTATOR CUFF REPAIR Right 03/29/2018   TONSILLECTOMY     TUBAL LIGATION     Family History  Problem Relation Age of Onset   Cancer Mother        laryngeal    Hypertension Mother    Heart disease Mother    Hypertension Father    Coronary artery disease Father    Kidney disease Father        kidney failure    Breast cancer Sister    Hypertension Sister    Cancer Sister        breast   Heart attack Sister    Heart disease Sister    Stroke Sister    Stroke Sister    Hypertension Sister    Hyperlipidemia Sister    Hypertension Daughter    Hypertension Brother    Cancer Brother        liver   Hypertension Brother    Heart attack Brother    Hypertension Son    Social History   Socioeconomic History   Marital status: Widowed    Spouse name: Not on file   Number of children: 4   Years of education: Not on file   Highest education level: Not on file  Occupational History   Occupation: retired     Comment: [employer]   Tobacco Use   Smoking status: Never   Smokeless tobacco: Never  Vaping Use   Vaping Use: Never used  Substance and Sexual Activity   Alcohol use: No    Alcohol/week: 0.0 standard drinks of alcohol   Drug use: No   Sexual activity: Never  Other Topics Concern   Not on file  Social History Narrative   Lives alone - son next door     Daughters in [place], one in [place], one in [place]   Social Determinants of Health   Financial Resource Strain: Low Risk  (02/03/2023)   Overall Financial Resource Strain (CARDIA)    Difficulty of Paying Living Expenses: Not hard at all  Food Insecurity: No Food Insecurity (02/03/2023)   Hunger Vital Sign    Worried About Running Out of Food in the  Last Year: Never true    Ran Out of Food in the Last Year: Never true  Transportation Needs: No Transportation Needs (02/03/2023)   PRAPARE - Administrator, Civil Service (Medical): No    Lack of Transportation (Non-Medical): No  Physical Activity: Insufficiently Active (02/03/2023)   Exercise Vital Sign    Days of Exercise per Week: 1 day    Minutes of Exercise per Session: 30 min  Stress: No Stress Concern Present (02/03/2023)   [employer] of Occupational Health - Occupational Stress Questionnaire    Feeling of Stress : Not at all  Social Connections: Moderately Isolated (02/03/2023)   Social Connection and Isolation Panel [NHANES]    Frequency of Communication with Friends and Family: More than three times a week    Frequency of Social Gatherings with Friends and Family: More than three times a week    Attends Religious Services: More than 4 times per year    Active Member of [employer] or Organizations: No    Attends Banker Meetings: Never    Marital Status: Widowed    Tobacco Counseling Counseling given: Not Answered   Clinical Intake:  Pre-visit preparation completed: Yes  Pain : No/denies pain     Nutritional Risks: None Diabetes: Yes CBG done?: No Did pt. bring in CBG monitor from home?: No  How often do you need to have someone help you when you read instructions, pamphlets, or other written materials from your doctor or pharmacy?: 1 - Never  Diabetic?yes Nutrition Risk Assessment:  Has the patient had any N/V/D within the last 2 months?  No  Does the patient have any non-healing wounds?   No  Has the patient had any unintentional weight loss or weight gain?  No   Diabetes:  Is the patient diabetic?  Yes  If diabetic, was a CBG obtained today?  No  Did the patient bring in their glucometer from home?  No  How often do you monitor your CBG's? Never .   Financial Strains and Diabetes Management:  Are you having any financial strains with the device, your supplies or your medication? No .  Does the patient want to be seen by Chronic Care Management for management of their diabetes?  No  Would the patient like to be referred to a Nutritionist or for Diabetic Management?  No   Diabetic Exams:  Diabetic Eye Exam: Completed 12/2022 Diabetic Foot Exam: Overdue, Pt has been advised about the importance in completing this exam. Pt is scheduled for diabetic foot exam on next office visit .   Interpreter Needed?: No  Information entered by :: Renie Ora, LPN   Activities of Daily Living    02/03/2023    2:51 PM  In your present state of health, do you have any difficulty performing the following activities:  Hearing? 0  Vision? 0  Difficulty concentrating or making decisions? 0  Walking or climbing stairs? 0  Dressing or bathing? 0  Doing errands, shopping? 0  Preparing Food and eating ? N  Using the Toilet? N  In the past six months, have you accidently leaked urine? N  Do you have problems with loss of bowel control? N  Managing your Medications? N  Managing your Finances? N  Housekeeping or managing your Housekeeping? N    Patient Care Team: Dettinger, Elige Radon, MD as PCP - General (Family Medicine) Jens Som Madolyn Frieze, MD as PCP - Cardiology (Cardiology) Vida Rigger, MD (Gastroenterology) Lewayne Bunting,  MD (Cardiology) Bjorn Pippin, MD (Urology) Durene Romans, MD (Orthopedic Surgery) Ernesto Rutherford, MD as Consulting Physician (Ophthalmology) Beverely Low, MD as Consulting Physician (Orthopedic Surgery)  Indicate any recent Medical Services you may have  received from other than [facility] providers in the past year (date may be approximate).     Assessment:   This is a routine wellness examination for [place].  Hearing/Vision screen Vision Screening - Comments:: Wears rx glasses - up to date with routine eye exams with  Dr.Groat   Dietary issues and exercise activities discussed: Current Exercise Habits: Home exercise routine, Type of exercise: walking, Time (Minutes): 30, Frequency (Times/Week): 1, Weekly Exercise (Minutes/Week): 30, Intensity: Mild, Exercise limited by: orthopedic condition(s)   Goals Addressed             This Visit's Progress    Prevent falls   On track      Depression Screen    02/03/2023    2:50 PM 12/05/2022    9:35 AM 04/04/2022    8:40 AM 12/04/2021    8:34 AM 06/03/2021    8:32 AM 04/01/2021    8:31 AM 03/14/2021    8:00 AM  PHQ 2/9 Scores  PHQ - 2 Score 0 0 0  0 0 0  PHQ- 9 Score   0   0 0  Exception Documentation    Patient refusal       Fall Risk    02/03/2023    2:49 PM 12/05/2022    9:35 AM 04/04/2022    8:39 AM 12/04/2021    8:34 AM 08/01/2021    8:52 AM  Fall Risk   Falls in the past year? 0 0 0 0 1  Number falls in past yr: 0    0  Injury with Fall? 0    1  Risk for fall due to : No Fall Risks    History of fall(s);Impaired balance/gait;Orthopedic patient  Follow up Falls prevention discussed    Falls evaluation completed    FALL RISK PREVENTION PERTAINING TO THE HOME:  Any stairs in or around the home? Yes  If so, are there any without handrails? No  Home free of loose throw rugs in walkways, pet beds, electrical cords, etc? Yes  Adequate lighting in your home to reduce risk of falls? Yes   ASSISTIVE DEVICES UTILIZED TO PREVENT FALLS:  Life alert? No  Use of a cane, walker or w/c? Yes  Grab bars in the bathroom? Yes  Shower chair or bench in shower? Yes  Elevated toilet seat or a handicapped toilet? Yes       06/29/2018    9:21 AM 04/29/2017   10:06 AM 04/22/2016    9:08 AM  MMSE -  Mini Mental State Exam  Orientation to time 5 5 5   Orientation to Place 5 5 5   Registration 3 3 3   Attention/ Calculation 5 5 5   Recall 1 3 3   Language- name 2 objects 2 2 2   Language- repeat 1 1 1   Language- follow 3 step command 3 3 3   Language- read & follow direction 1 1 1   Write a sentence 1 1 1   Copy design 1 1 1   Total score 28 30 30         02/03/2023    2:51 PM 06/03/2021    8:46 AM  6CIT Screen  What Year? 0 points 0 points  What month? 0 points 0 points  What time? 0 points 0 points  Count back  from 20 0 points 0 points  Months in reverse 0 points 0 points  Repeat phrase 0 points 4 points  Total Score 0 points 4 points    Immunizations Immunization History  Administered Date(s) Administered   Fluad Quad(high Dose 65+) 06/21/2019, 06/19/2020, 06/30/2022   Influenza, High Dose Seasonal PF 06/22/2015, 05/26/2016, 06/04/2017, 06/15/2018, 07/01/2021   Influenza,inj,Quad PF,6+ Mos 06/08/2013, 06/14/2014   Influenza,inj,quad, With Preservative 06/02/2019   Moderna Sars-Covid-2 Vaccination 11/08/2019, 11/29/2019, 07/17/2020, 04/30/2021   Pneumococcal Conjugate-13 09/21/2013   Pneumococcal Polysaccharide-23 06/23/2019   Tdap 10/10/2021   Zoster Recombinat (Shingrix) 10/24/2021    TDAP status: Up to date  Flu Vaccine status: Up to date  Pneumococcal vaccine status: Up to date  Covid-19 vaccine status: Completed vaccines  Qualifies for Shingles Vaccine? Yes   Zostavax completed Yes   Shingrix Completed?: Yes  Screening Tests Health Maintenance  Topic Date Due   Diabetic kidney evaluation - Urine ACR  01/06/2015   COVID-19 Vaccine (5 - 2023-24 season) 05/02/2022   MAMMOGRAM  03/25/2023   INFLUENZA VACCINE  04/02/2023   HEMOGLOBIN A1C  06/03/2023   Diabetic kidney evaluation - eGFR measurement  12/02/2023   FOOT EXAM  12/05/2023   OPHTHALMOLOGY EXAM  12/11/2023   Medicare Annual Wellness (AWV)  02/03/2024   DEXA SCAN  12/07/2024   DTaP/Tdap/Td (2 - Td or  Tdap) 10/11/2031   Pneumonia Vaccine 54+ Years old  Completed   Zoster Vaccines- Shingrix  Completed   HPV VACCINES  Aged Out   PAP SMEAR-Modifier  Discontinued    Health Maintenance  Health Maintenance Due  Topic Date Due   Diabetic kidney evaluation - Urine ACR  01/06/2015   COVID-19 Vaccine (5 - 2023-24 season) 05/02/2022    Colorectal cancer screening: No longer required.   Mammogram status: No longer required due to age .  Bone Density status: Completed 12/08/2022. Results reflect: Bone density results: OSTEOPOROSIS. Repeat every 2 years.  Lung Cancer Screening: (Low Dose CT Chest recommended if Age 46-80 years, 30 pack-year currently smoking OR have quit w/in 15years.) does not qualify.   Lung Cancer Screening Referral: n/a  Additional Screening:  Hepatitis C Screening: does not qualify;   Vision Screening: Recommended annual ophthalmology exams for early detection of glaucoma and other disorders of the eye. Is the patient up to date with their annual eye exam?  Yes  Who is the provider or what is the name of the office in which the patient attends annual eye exams? Dr.Groat / Dr.Rankin  If pt is not established with a provider, would they like to be referred to a provider to establish care? No .   Dental Screening: Recommended annual dental exams for proper oral hygiene  Community Resource Referral / Chronic Care Management: CRR required this visit?  No   CCM required this visit?  No      Plan:     I have personally reviewed and noted the following in the patient's chart:   Medical and social history Use of alcohol, tobacco or illicit drugs  Current medications and supplements including opioid prescriptions. Patient is not currently taking opioid prescriptions. Functional ability and status Nutritional status Physical activity Advanced directives List of other physicians Hospitalizations, surgeries, and ER visits in previous 12 months Vitals Screenings  to include cognitive, depression, and falls Referrals and appointments  In addition, I have reviewed and discussed with patient certain preventive protocols, quality metrics, and best practice recommendations. A written personalized care plan for preventive services  as well as general preventive health recommendations were provided to patient.     Lorrene Reid, LPN   09/06/1094   Nurse Notes: none

## 2023-02-03 NOTE — Patient Instructions (Signed)
[name] , Thank you for taking time to come for your Medicare Wellness Visit. I appreciate your ongoing commitment to your health goals. Please review the following plan we discussed and let me know if I can assist you in the future.   These are the goals we discussed:  Goals      Exercise 3x per week (30 min per time)     Do chair exercises at home daily. Check into exercise programs at the recreation department or [employer].      Prevent falls     Weight (lb) < 160 lb (72.6 kg)        This is a list of the screening recommended for you and due dates:  Health Maintenance  Topic Date Due   Yearly kidney health urinalysis for diabetes  01/06/2015   COVID-19 Vaccine (5 - 2023-24 season) 05/02/2022   Mammogram  03/25/2023   Flu Shot  04/02/2023   Hemoglobin A1C  06/03/2023   Yearly kidney function blood test for diabetes  12/02/2023   Complete foot exam   12/05/2023   Eye exam for diabetics  12/11/2023   Medicare Annual Wellness Visit  02/03/2024   DEXA scan (bone density measurement)  12/07/2024   DTaP/Tdap/Td vaccine (2 - Td or Tdap) 10/11/2031   Pneumonia Vaccine  Completed   Zoster (Shingles) Vaccine  Completed   HPV Vaccine  Aged Out   Pap Smear  Discontinued    Advanced directives: Advance directive discussed with you today. I have provided a copy for you to complete at home and have notarized. Once this is complete please bring a copy in to our office so we can scan it into your chart.   Conditions/risks identified: Aim for 30 minutes of exercise or brisk walking, 6-8 glasses of water, and 5 servings of fruits and vegetables each day.   Next appointment: Follow up in one year for your annual wellness visit    Preventive Care 65 Years and Older, Female Preventive care refers to lifestyle choices and visits with your health care provider that can promote health and wellness. What does preventive care include? A yearly physical exam. This is also called an annual well  check. Dental exams once or twice a year. Routine eye exams. Ask your health care provider how often you should have your eyes checked. Personal lifestyle choices, including: Daily care of your teeth and gums. Regular physical activity. Eating a healthy diet. Avoiding tobacco and drug use. Limiting alcohol use. Practicing safe sex. Taking low-dose aspirin every day. Taking vitamin and mineral supplements as recommended by your health care provider. What happens during an annual well check? The services and screenings done by your health care provider during your annual well check will depend on your age, overall health, lifestyle risk factors, and family history of disease. Counseling  Your health care provider may ask you questions about your: Alcohol use. Tobacco use. Drug use. Emotional well-being. Home and relationship well-being. Sexual activity. Eating habits. History of falls. Memory and ability to understand (cognition). Work and work Astronomer. Reproductive health. Screening  You may have the following tests or measurements: Height, weight, and BMI. Blood pressure. Lipid and cholesterol levels. These may be checked every 5 years, or more frequently if you are over 10 years old. Skin check. Lung cancer screening. You may have this screening every year starting at age 32 if you have a 30-pack-year history of smoking and currently smoke or have quit within the past 15 years.  Fecal occult blood test (FOBT) of the stool. You may have this test every year starting at age 27. Flexible sigmoidoscopy or colonoscopy. You may have a sigmoidoscopy every 5 years or a colonoscopy every 10 years starting at age 74. Hepatitis C blood test. Hepatitis B blood test. Sexually transmitted disease (STD) testing. Diabetes screening. This is done by checking your blood sugar (glucose) after you have not eaten for a while (fasting). You may have this done every 1-3 years. Bone density scan.  This is done to screen for osteoporosis. You may have this done starting at age 61. Mammogram. This may be done every 1-2 years. Talk to your health care provider about how often you should have regular mammograms. Talk with your health care provider about your test results, treatment options, and if necessary, the need for more tests. Vaccines  Your health care provider may recommend certain vaccines, such as: Influenza vaccine. This is recommended every year. Tetanus, diphtheria, and acellular pertussis (Tdap, Td) vaccine. You may need a Td booster every 10 years. Zoster vaccine. You may need this after age 8. Pneumococcal 13-valent conjugate (PCV13) vaccine. One dose is recommended after age 84. Pneumococcal polysaccharide (PPSV23) vaccine. One dose is recommended after age 54. Talk to your health care provider about which screenings and vaccines you need and how often you need them. This information is not intended to replace advice given to you by your health care provider. Make sure you discuss any questions you have with your health care provider. Document Released: 09/14/2015 Document Revised: 05/07/2016 Document Reviewed: 06/19/2015 Elsevier Interactive Patient Education  2017 [employer].  Fall Prevention in the Home Falls can cause injuries. They can happen to people of all ages. There are many things you can do to make your home safe and to help prevent falls. What can I do on the outside of my home? Regularly fix the edges of walkways and driveways and fix any cracks. Remove anything that might make you trip as you walk through a door, such as a raised step or threshold. Trim any bushes or trees on the path to your home. Use bright outdoor lighting. Clear any walking paths of anything that might make someone trip, such as rocks or tools. Regularly check to see if handrails are loose or broken. Make sure that both sides of any steps have handrails. Any raised decks and porches  should have guardrails on the edges. Have any leaves, snow, or ice cleared regularly. Use sand or salt on walking paths during winter. Clean up any spills in your garage right away. This includes oil or grease spills. What can I do in the bathroom? Use night lights. Install grab bars by the toilet and in the tub and shower. Do not use towel bars as grab bars. Use non-skid mats or decals in the tub or shower. If you need to sit down in the shower, use a plastic, non-slip stool. Keep the floor dry. Clean up any water that spills on the floor as soon as it happens. Remove soap buildup in the tub or shower regularly. Attach bath mats securely with double-sided non-slip rug tape. Do not have throw rugs and other things on the floor that can make you trip. What can I do in the bedroom? Use night lights. Make sure that you have a light by your bed that is easy to reach. Do not use any sheets or blankets that are too big for your bed. They should not hang down onto  the floor. Have a firm chair that has side arms. You can use this for support while you get dressed. Do not have throw rugs and other things on the floor that can make you trip. What can I do in the kitchen? Clean up any spills right away. Avoid walking on wet floors. Keep items that you use a lot in easy-to-reach places. If you need to reach something above you, use a strong step stool that has a grab bar. Keep electrical cords out of the way. Do not use floor polish or wax that makes floors slippery. If you must use wax, use non-skid floor wax. Do not have throw rugs and other things on the floor that can make you trip. What can I do with my stairs? Do not leave any items on the stairs. Make sure that there are handrails on both sides of the stairs and use them. Fix handrails that are broken or loose. Make sure that handrails are as long as the stairways. Check any carpeting to make sure that it is firmly attached to the stairs.  Fix any carpet that is loose or worn. Avoid having throw rugs at the top or bottom of the stairs. If you do have throw rugs, attach them to the floor with carpet tape. Make sure that you have a light switch at the top of the stairs and the bottom of the stairs. If you do not have them, ask someone to add them for you. What else can I do to help prevent falls? Wear shoes that: Do not have high heels. Have rubber bottoms. Are comfortable and fit you well. Are closed at the toe. Do not wear sandals. If you use a stepladder: Make sure that it is fully opened. Do not climb a closed stepladder. Make sure that both sides of the stepladder are locked into place. Ask someone to hold it for you, if possible. Clearly mark and make sure that you can see: Any grab bars or handrails. First and last steps. Where the edge of each step is. Use tools that help you move around (mobility aids) if they are needed. These include: Canes. Walkers. Scooters. Crutches. Turn on the lights when you go into a dark area. Replace any light bulbs as soon as they burn out. Set up your furniture so you have a clear path. Avoid moving your furniture around. If any of your floors are uneven, fix them. If there are any pets around you, be aware of where they are. Review your medicines with your doctor. Some medicines can make you feel dizzy. This can increase your chance of falling. Ask your doctor what other things that you can do to help prevent falls. This information is not intended to replace advice given to you by your health care provider. Make sure you discuss any questions you have with your health care provider. Document Released: 06/14/2009 Document Revised: 01/24/2016 Document Reviewed: 09/22/2014 Elsevier Interactive Patient Education  2017 [employer].

## 2023-02-09 ENCOUNTER — Encounter (INDEPENDENT_AMBULATORY_CARE_PROVIDER_SITE_OTHER): Payer: 59 | Admitting: Ophthalmology

## 2023-02-09 DIAGNOSIS — M62830 Muscle spasm of back: Secondary | ICD-10-CM | POA: Diagnosis not present

## 2023-02-09 DIAGNOSIS — M9904 Segmental and somatic dysfunction of sacral region: Secondary | ICD-10-CM | POA: Diagnosis not present

## 2023-02-09 DIAGNOSIS — M9903 Segmental and somatic dysfunction of lumbar region: Secondary | ICD-10-CM | POA: Diagnosis not present

## 2023-02-09 DIAGNOSIS — M9901 Segmental and somatic dysfunction of cervical region: Secondary | ICD-10-CM | POA: Diagnosis not present

## 2023-02-10 ENCOUNTER — Encounter (INDEPENDENT_AMBULATORY_CARE_PROVIDER_SITE_OTHER): Payer: 59 | Admitting: Ophthalmology

## 2023-02-10 DIAGNOSIS — L84 Corns and callosities: Secondary | ICD-10-CM | POA: Diagnosis not present

## 2023-02-10 DIAGNOSIS — B351 Tinea unguium: Secondary | ICD-10-CM | POA: Diagnosis not present

## 2023-02-10 DIAGNOSIS — M79676 Pain in unspecified toe(s): Secondary | ICD-10-CM | POA: Diagnosis not present

## 2023-02-10 DIAGNOSIS — E1142 Type 2 diabetes mellitus with diabetic polyneuropathy: Secondary | ICD-10-CM | POA: Diagnosis not present

## 2023-02-11 ENCOUNTER — Encounter: Payer: Self-pay | Admitting: Family Medicine

## 2023-02-11 ENCOUNTER — Ambulatory Visit (INDEPENDENT_AMBULATORY_CARE_PROVIDER_SITE_OTHER): Payer: Medicare Other | Admitting: Family Medicine

## 2023-02-11 VITALS — BP 130/76 | HR 54 | Temp 98.5°F | Ht 60.0 in | Wt 165.0 lb

## 2023-02-11 DIAGNOSIS — R001 Bradycardia, unspecified: Secondary | ICD-10-CM

## 2023-02-11 DIAGNOSIS — M25551 Pain in right hip: Secondary | ICD-10-CM

## 2023-02-11 DIAGNOSIS — M79604 Pain in right leg: Secondary | ICD-10-CM

## 2023-02-11 MED ORDER — PREDNISONE 20 MG PO TABS
20.0000 mg | ORAL_TABLET | Freq: Every day | ORAL | 0 refills | Status: AC
Start: 2023-02-11 — End: 2023-02-16

## 2023-02-11 NOTE — Progress Notes (Signed)
Acute Office Visit  Subjective:  Patient ID: Misty Smith, female    DOB: [birth year], 82 y.o.   MRN: [id number]  Chief Complaint  Patient presents with   right hip/leg pain    X 2 weeks    HPI Patient is in today for right hip and leg pain. States that she hurt it while getting into her daughters rented car. She was trying to pull herself into her car.  States that it is starting in her lower back and radiating down to her right knee. She has tried biofreeze on the pain. She takes tylenol, salonpas. She has taken some ibuprofen, but tries to avoid nsaids. She is seeing a chiropractor but does not think it is helping this problem. She is currently seeing an orthopedist for her knees and is supposed to be getting injections in July for the pain. She describes it as a set of 3 injections.  ROS As per HPI  Objective:  BP 130/76   Pulse (!) 54   Temp 98.5 F (36.9 C)   Ht 5' (1.524 m)   Wt 165 lb (74.8 kg)   SpO2 97%   BMI 32.22 kg/m    Physical Exam Constitutional:      General: She is awake. She is not in acute distress.    Appearance: Normal appearance. She is well-developed and well-groomed. She is not ill-appearing, toxic-appearing or diaphoretic.  Cardiovascular:     Rate and Rhythm: Bradycardia present.     Pulses: Normal pulses.          Radial pulses are 2+ on the right side and 2+ on the left side.       Posterior tibial pulses are 2+ on the right side and 2+ on the left side.     Heart sounds: Normal heart sounds. No murmur heard.    No gallop.  Pulmonary:     Effort: Pulmonary effort is normal. No respiratory distress.     Breath sounds: Normal breath sounds. No stridor. No wheezing, rhonchi or rales.  Musculoskeletal:     Cervical back: Full passive range of motion without pain and neck supple.     Right hip: Tenderness and bony tenderness present. No deformity, lacerations or crepitus. Decreased range of motion.     Right upper leg: Tenderness and bony  tenderness present. No swelling, edema, deformity or lacerations.     Right lower leg: No deformity or lacerations. No edema.     Left lower leg: No edema.     Comments: Decreased ROM due to pain. Tenderness to palpation along ITB  Skin:    General: Skin is warm.     Capillary Refill: Capillary refill takes less than 2 seconds.  Neurological:     General: No focal deficit present.     Mental Status: She is alert, oriented to person, place, and time and easily aroused. Mental status is at baseline.     GCS: GCS eye subscore is 4. GCS verbal subscore is 5. GCS motor subscore is 6.     Motor: No weakness.  Psychiatric:        Attention and Perception: Attention and perception normal.        Mood and Affect: Mood and affect normal.        Speech: Speech normal.        Behavior: Behavior normal. Behavior is cooperative.        Thought Content: Thought content normal. Thought content does not include homicidal or  suicidal ideation. Thought content does not include homicidal or suicidal plan.        Cognition and Memory: Cognition and memory normal.        Judgment: Judgment normal.    Assessment & Plan:  1. Pain of right hip Will start low dose prednisone as below. Do not want to do injection as patient is receiving injection next month at ortho. Patient has history of Diabetes. Reviewed A1C from 12/02/22 and patient is well controlled with A1C of 6.0%. did not want to use Naproxen due to risk of bleeding. Will refer to PT as below. Patient to continue conservative management as well.  - predniSONE (DELTASONE) 20 MG tablet; Take 1 tablet (20 mg total) by mouth daily with breakfast for 5 days.  Dispense: 5 tablet; Refill: 0 - Ambulatory referral to Physical Therapy  2. Pain of right lower extremity Will start low dose prednisone as below. Do not want to do injection as patient is receiving injection next month at ortho. Patient has history of Diabetes. Reviewed A1C from 12/02/22 and patient is well  controlled with A1C of 6.0%. did not want to use Naproxen due to risk of bleeding. Will refer to PT as below. Patient to continue conservative management as well.  - predniSONE (DELTASONE) 20 MG tablet; Take 1 tablet (20 mg total) by mouth daily with breakfast for 5 days.  Dispense: 5 tablet; Refill: 0 - Ambulatory referral to Physical Therapy  3. Bradycardia Asymptomatic. Discussed with patient to call Cardiology and ask for hold parameters for her metoprolol due to bradycardia. Reviewed EKG from 09/19/22 and patient had sinus bradycardia at that time as well. Reviewed note from [facility] 09/19/22, patient was noted to have bradycardia at that time as well. Will refer to cardiology for follow up.   The above assessment and management plan was discussed with the patient. The patient verbalized understanding of and has agreed to the management plan using shared-decision making. Patient is aware to call the clinic if they develop any new symptoms or if symptoms fail to improve or worsen. Patient is aware when to return to the clinic for a follow-up visit. Patient educated on when it is appropriate to go to the emergency department.   Return if symptoms worsen or fail to improve.  Neale Burly, DNP-FNP [facility] [address] [place], [place] [address] [phone]

## 2023-02-11 NOTE — Patient Instructions (Signed)
Ask Cardiology for "hold parameters" for metoprolol

## 2023-02-16 DIAGNOSIS — M9904 Segmental and somatic dysfunction of sacral region: Secondary | ICD-10-CM | POA: Diagnosis not present

## 2023-02-16 DIAGNOSIS — M62830 Muscle spasm of back: Secondary | ICD-10-CM | POA: Diagnosis not present

## 2023-02-16 DIAGNOSIS — M9901 Segmental and somatic dysfunction of cervical region: Secondary | ICD-10-CM | POA: Diagnosis not present

## 2023-02-16 DIAGNOSIS — M9903 Segmental and somatic dysfunction of lumbar region: Secondary | ICD-10-CM | POA: Diagnosis not present

## 2023-02-19 ENCOUNTER — Other Ambulatory Visit: Payer: Self-pay | Admitting: Family Medicine

## 2023-02-19 DIAGNOSIS — Z1231 Encounter for screening mammogram for malignant neoplasm of breast: Secondary | ICD-10-CM

## 2023-02-20 ENCOUNTER — Other Ambulatory Visit: Payer: Self-pay

## 2023-02-20 ENCOUNTER — Ambulatory Visit: Payer: Medicare Other | Attending: Family Medicine

## 2023-02-20 DIAGNOSIS — M79651 Pain in right thigh: Secondary | ICD-10-CM | POA: Insufficient documentation

## 2023-02-20 DIAGNOSIS — M5459 Other low back pain: Secondary | ICD-10-CM | POA: Diagnosis not present

## 2023-02-20 DIAGNOSIS — M79604 Pain in right leg: Secondary | ICD-10-CM | POA: Insufficient documentation

## 2023-02-20 DIAGNOSIS — M25551 Pain in right hip: Secondary | ICD-10-CM | POA: Diagnosis not present

## 2023-02-20 NOTE — Therapy (Signed)
OUTPATIENT PHYSICAL THERAPY LOWER EXTREMITY EVALUATION   Patient Name: Misty Smith MRN: [id number] DOB:04-28-1941, 82 y.o., female Today's Date: 02/20/2023  END OF SESSION:  PT End of Session - 02/20/23 0849     Visit Number 1    Number of Visits 8    Date for PT Re-Evaluation 05/01/23    PT Start Time 0850    PT Stop Time 0931    PT Time Calculation (min) 41 min    Activity Tolerance Patient tolerated treatment well    Behavior During Therapy [facility] for tasks assessed/performed             Past Medical History:  Diagnosis Date   Allergy    Anxiety    Arthritis    Bronchitis    Cataract    Colovaginal fistula    Depression    Diabetes mellitus without complication (HCC)    Dysrhythmia    palpitations, occasional PVCs; Dr Jens Som cardiologist   Esophageal stricture    Fractured elbow 1970's   GERD (gastroesophageal reflux disease)    H/O hiatal hernia    Hyperlipidemia    Hypertension    Osteopenia    Perimenopausal vasomotor symptoms    Postmenopausal HRT (hormone replacement therapy)    Pre-diabetes    Stress    Subacute lupus erythematosus    Dr. Terri Piedra   Past Surgical History:  Procedure Laterality Date   abdominal bypass  1987   fibroids    ABDOMINAL HYSTERECTOMY     partial   bilateral tubal ligtation  1972   CHOLECYSTECTOMY N/A 03/21/2014   Procedure: LAPAROSCOPIC CHOLECYSTECTOMY WITH INTRAOPERATIVE CHOLANGIOGRAM;  Surgeon: Kandis Cocking, MD;  Location: [facility] OR;  Service: General;  Laterality: N/A;   COLONOSCOPY     elbow Right 1976   surgery after fracture of elbow   EYE SURGERY Bilateral    cataracts   ROTATOR CUFF REPAIR Right 03/29/2018   TONSILLECTOMY     TUBAL LIGATION     Patient Active Problem List   Diagnosis Date Noted   Osteoarthritis of knee 09/11/2021   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   Upper airway cough syndrome 02/08/2021   Type 2 diabetes mellitus with other specified complication (HCC) 01/30/2021   Lumbar  radiculopathy 01/11/2021   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   Impingement syndrome of shoulder region 09/26/2017   Vitamin D deficiency 03/18/2016   CAD (coronary artery disease) 12/30/2013   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   Osteopenia of the elderly 10/26/2013   Generalized anxiety disorder 09/21/2013   GERD (gastroesophageal reflux disease) 09/21/2013   Multiple pulmonary nodules determined by computed tomography of lung 09/21/2013   Hyperlipidemia 02/01/2009   Essential hypertension 02/01/2009   REFERRING PROVIDER: Arrie Senate, FNP   REFERRING DIAG: Pain of right lower extremity; Pain of right hip   THERAPY DIAG:  Other low back pain  Pain in right thigh  Rationale for Evaluation and Treatment: Rehabilitation  ONSET DATE: late May 2024  SUBJECTIVE:   SUBJECTIVE STATEMENT: Patient reports that she may have strained a muscle in her back and right leg after she got into and out of her daughters car which is higher than hers. She feels her pain comes and goes in waves as it will get better and then start hurting more. She has bilateral knee pain, but she is scheduled to get injections on March 30, 2023.  PERTINENT HISTORY: Hypertension, diabetes, osteopenia, osteoarthritis, anxiety, and depression PAIN:  Are you having pain? Yes: NPRS  scale: 5/10 Pain location: low back and right leg to the knee Pain description: intermittent throbbing Aggravating factors: walking (about 10 minutes), housework, bed mobility Relieving factors: ice and heat   PRECAUTIONS: None  WEIGHT BEARING RESTRICTIONS: No  FALLS:  Has patient fallen in last 6 months? No  LIVING ENVIRONMENT: Lives with: lives with their family Lives in: House/apartment Stairs: Yes: Internal: she uses her chair lift at home steps;   Has following equipment at home: Quad cane small base  OCCUPATION: retired  PLOF: Independent  PATIENT GOALS: reduced pain and be able to  walk longer  NEXT MD VISIT: 04/06/23  OBJECTIVE:   PATIENT SURVEYS:  FOTO 34.53  COGNITION: Overall cognitive status: Within functional limits for tasks assessed     SENSATION: Patient reports no numbness or tingling.   EDEMA:  No edema observed  PALPATION: TTP: right lumbar paraspinals, QL, gluteals, piriformis, TFL, iliotibial band, and quadriceps  LUMBAR JOINT MOBILITY:  Hypomobile and "tender" throughout   LUMBAR ROM:   Active  A/PROM  eval  Flexion 20; familiar back and RLE pain   Extension 6; familiar back and RLE pain   Right lateral flexion   Left lateral flexion   Right rotation 50% limited; right low back pain  Left rotation 50% limited   (Blank rows = not tested)   LOWER EXTREMITY ROM: WFL for activities assessed  LOWER EXTREMITY MMT:  MMT Right eval Left eval  Hip flexion 4-/5; "pulling" 4-/5  Hip extension    Hip abduction    Hip adduction    Hip internal rotation    Hip external rotation    Knee flexion 4/5; "pulling, but not as bad"  4/5  Knee extension 3+/5:/; "pulling" 4-/5  Ankle dorsiflexion 3+/5 3+/5  Ankle plantarflexion    Ankle inversion    Ankle eversion     (Blank rows = not tested)  FUNCTIONAL TESTS:  Sit to stand transfer: required UE support and painful  Bed mobility: required modA and painful   GAIT: Assistive device utilized: Quad cane small base Level of assistance: Modified independence Comments: left trendelenburg   TODAY'S TREATMENT:                                                                                                                              DATE:     PATIENT EDUCATION:  Education details: Plan of care, healing, prognosis, anatomy, objective findings, and goals for therapy Person educated: Patient Education method: Explanation Education comprehension: verbalized understanding  HOME EXERCISE PROGRAM:   ASSESSMENT:  CLINICAL IMPRESSION: Patient is a 82 y.o. female who was seen today for  physical therapy evaluation and treatment for low back pain and referred pain into her left lower extremity. She presented with moderate pain severity and irritability with lumbar flexion and extension active range of motion reproducing her familiar symptoms. She exhibited reduced lumbar active range of motion and bilateral lower extremity muscular strength. Recommend that she  continue with skilled physical therapy to address her impairments to return to her prior level of function.  OBJECTIVE IMPAIRMENTS: Abnormal gait, decreased activity tolerance, decreased mobility, difficulty walking, decreased ROM, decreased strength, hypomobility, impaired tone, and pain.   ACTIVITY LIMITATIONS: carrying, lifting, bending, standing, stairs, transfers, bed mobility, and locomotion level  PARTICIPATION LIMITATIONS: meal prep, cleaning, laundry, shopping, community activity, and yard work  PERSONAL FACTORS: Age and 3+ comorbidities: Hypertension, diabetes, osteopenia, osteoarthritis, anxiety, and depression  are also affecting patient's functional outcome.   REHAB POTENTIAL: Good  CLINICAL DECISION MAKING: Evolving/moderate complexity  EVALUATION COMPLEXITY: Moderate   GOALS: Goals reviewed with patient? Yes  LONG TERM GOALS: Target date: 03/20/23  Patient will be independent with her home exercise program. Baseline:  Goal status: INITIAL  2.  Patient will be able to complete her daily activities without her familiar pain exceeding 3/10.  Baseline:  Goal status: INITIAL  3.  Patient will be able to transfer from supine to sitting independently for improved bed mobility. Baseline:  Goal status: INITIAL  4.  Patient will be able to ambulate with her assistive device with no significant gait deviations for improved mobility. Baseline:  Goal status: INITIAL  5.  Patient will be able to walk for at least 20 minutes without being limited by her familiar symptoms. Baseline:  Goal status:  INITIAL  PLAN:  PT FREQUENCY: 2x/week  PT DURATION: 4 weeks  PLANNED INTERVENTIONS: Therapeutic exercises, Therapeutic activity, Neuromuscular re-education, Balance training, Gait training, Patient/Family education, Self Care, Joint mobilization, Stair training, Electrical stimulation, Spinal mobilization, Cryotherapy, Moist heat, Manual therapy, and Re-evaluation  PLAN FOR NEXT SESSION: NuStep, isometrics, lower trunk rotations, lower extremity strengthening, and modalities as needed   Granville Lewis, PT 02/20/2023, 1:43 PM

## 2023-02-23 ENCOUNTER — Telehealth: Payer: Self-pay | Admitting: Cardiology

## 2023-02-23 NOTE — Telephone Encounter (Signed)
Pt c/o medication issue:  1. Name of Medication:   metoprolol tartrate (LOPRESSOR) 25 MG tablet    2. How are you currently taking this medication (dosage and times per day)? Not sure   3. Are you having a reaction (difficulty breathing--STAT)? no  4. What is your medication issue? Patient's daughter says the patient has been having dizziness and her PCP requested parameters for holding the metoprolol.  STAT if patient feels like he/she is going to faint   Are you dizzy now? Not with her now   Do you feel faint or have you passed out? no  Do you have any other symptoms? Not feeling well  Have you checked your HR and BP (record if available)? HR was 51 saw when last Tuesday 6/18   Patient's daughter states the patient has been having dizziness. She says the patient saw her PCP last Tuesday 6/18 and they requested she call and ask if Dr. Jens Som had parameters for holding her metoprolol. She says her HR at the appointment was 51. She says at one time Dr. Jens Som had discussed cutting the metoprolol in half, but mentioned it could increase palpitations.

## 2023-02-23 NOTE — Telephone Encounter (Signed)
Attempt to call pt daughter Victorino Dike [facility]), unable to leave VM message.

## 2023-02-24 ENCOUNTER — Ambulatory Visit: Payer: Medicare Other

## 2023-02-24 DIAGNOSIS — M5459 Other low back pain: Secondary | ICD-10-CM | POA: Diagnosis not present

## 2023-02-24 DIAGNOSIS — M79651 Pain in right thigh: Secondary | ICD-10-CM

## 2023-02-24 DIAGNOSIS — M79604 Pain in right leg: Secondary | ICD-10-CM | POA: Diagnosis not present

## 2023-02-24 DIAGNOSIS — M25551 Pain in right hip: Secondary | ICD-10-CM | POA: Diagnosis not present

## 2023-02-24 NOTE — Telephone Encounter (Signed)
Lewayne Bunting, MD  Freddi Starr, RN    HR 51 ok; continue present dose of metoprolol Olga Millers     Spoke with pt's daughter, Victorino Dike (ok per [facility]) regarding recommendations per Dr. Jens Som and to get some blood pressure readings. Daughter states that pt does not keep a blood pressure log, most recent blood pressure was done at her PCP, on 02/11/23 130/76, HR 54 and at another PCP visit 12/05/22 129/70, HR 59.   Daughter did want to reiterate that HR of 51 was not the only concern. It was that pt feels symptomatic with these heart rates. Daughter states that pt is experiencing tiredness, dizziness, and can be lightheaded. Advised daughter that I would send this message back over to Dr. Jens Som.

## 2023-02-24 NOTE — Telephone Encounter (Signed)
Call to daughter straight to VM, no VM set up unable to LM. Call to patient. LVM to call office

## 2023-02-24 NOTE — Telephone Encounter (Signed)
Left message for pt daughter to call, what is her blood pressure doing?

## 2023-02-24 NOTE — Telephone Encounter (Signed)
Follow Up:    Daughter is returning Debra's call.

## 2023-02-24 NOTE — Telephone Encounter (Signed)
Gave all information to the daughter. Advised to check BP in the morning and then after if possible. Daughter states they cannot take her BP as they don't  have anyone to take it.  Advised to take when they can as we need BP readings for future adjustments and to know if changes will need to be done based on readings.  She go es in detail again they cannot take her BP as not there and again advised any readings can help and whenever she has them taken wether by PCP or pharmacy or a friend comes by.  She then goes on about they will not be the same machines.  Advised we understand variance but again if changes are needed, we have to have some readings to go by.  At this time, she states understanding and states will try to keep a log

## 2023-02-24 NOTE — Telephone Encounter (Signed)
Daughter states patient had leg issues and saw PCP.  Noted HR was 51.  S/S tired, dizzy, " feeling off".  States that the doctor stated possibly cutting metoprolol in half but also said she may feel her Afib more.   See OV noted Heart monitor is possibility as well. PCP stated she may need parameters may be needed. She does not know of any other low HR other than at PCP but does have those symptoms.  Please advise

## 2023-02-24 NOTE — Therapy (Signed)
OUTPATIENT PHYSICAL THERAPY LOWER EXTREMITY TREATMENT   Patient Name: Misty Smith MRN: [id number] DOB:1940/11/25, 82 y.o., female Today's Date: 02/24/2023  END OF SESSION:  PT End of Session - 02/24/23 0805     Visit Number 2    Number of Visits 8    Date for PT Re-Evaluation 05/01/23    PT Start Time 0800    PT Stop Time 0845    PT Time Calculation (min) 45 min    Activity Tolerance Patient tolerated treatment well    Behavior During Therapy WFL for tasks assessed/performed              Past Medical History:  Diagnosis Date   Allergy    Anxiety    Arthritis    Bronchitis    Cataract    Colovaginal fistula    Depression    Diabetes mellitus without complication (HCC)    Dysrhythmia    palpitations, occasional PVCs; Dr Jens Som cardiologist   Esophageal stricture    Fractured elbow 1970's   GERD (gastroesophageal reflux disease)    H/O hiatal hernia    Hyperlipidemia    Hypertension    Osteopenia    Perimenopausal vasomotor symptoms    Postmenopausal HRT (hormone replacement therapy)    Pre-diabetes    Stress    Subacute lupus erythematosus    Dr. Terri Piedra   Past Surgical History:  Procedure Laterality Date   abdominal bypass  1987   fibroids    ABDOMINAL HYSTERECTOMY     partial   bilateral tubal ligtation  1972   CHOLECYSTECTOMY N/A 03/21/2014   Procedure: LAPAROSCOPIC CHOLECYSTECTOMY WITH INTRAOPERATIVE CHOLANGIOGRAM;  Surgeon: Kandis Cocking, MD;  Location: [facility] OR;  Service: General;  Laterality: N/A;   COLONOSCOPY     elbow Right 1976   surgery after fracture of elbow   EYE SURGERY Bilateral    cataracts   ROTATOR CUFF REPAIR Right 03/29/2018   TONSILLECTOMY     TUBAL LIGATION     Patient Active Problem List   Diagnosis Date Noted   Osteoarthritis of knee 09/11/2021   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   Upper airway cough syndrome 02/08/2021   Type 2 diabetes mellitus with other specified complication (HCC) 01/30/2021   Lumbar  radiculopathy 01/11/2021   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   Impingement syndrome of shoulder region 09/26/2017   Vitamin D deficiency 03/18/2016   CAD (coronary artery disease) 12/30/2013   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   Osteopenia of the elderly 10/26/2013   Generalized anxiety disorder 09/21/2013   GERD (gastroesophageal reflux disease) 09/21/2013   Multiple pulmonary nodules determined by computed tomography of lung 09/21/2013   Hyperlipidemia 02/01/2009   Essential hypertension 02/01/2009   REFERRING PROVIDER: Arrie Senate, FNP   REFERRING DIAG: Pain of right lower extremity; Pain of right hip   THERAPY DIAG:  Other low back pain  Pain in right thigh  Rationale for Evaluation and Treatment: Rehabilitation  ONSET DATE: late May 2024  SUBJECTIVE:   SUBJECTIVE STATEMENT: Patient reports that she is hurting a little bit more in the morning.   PERTINENT HISTORY: Hypertension, diabetes, osteopenia, osteoarthritis, anxiety, and depression PAIN:  Are you having pain? Yes: NPRS scale: 4/10 Pain location: low back and right leg to the knee Pain description: intermittent throbbing Aggravating factors: walking (about 10 minutes), housework, bed mobility Relieving factors: ice and heat   PRECAUTIONS: None  WEIGHT BEARING RESTRICTIONS: No  FALLS:  Has patient fallen in last 6 months? No  LIVING ENVIRONMENT: Lives with: lives with their family Lives in: House/apartment Stairs: Yes: Internal: she uses her chair lift at home steps;   Has following equipment at home: Quad cane small base  OCCUPATION: retired  PLOF: Independent  PATIENT GOALS: reduced pain and be able to walk longer  NEXT MD VISIT: 04/06/23  OBJECTIVE: all objective measures were assessed at her initial evaluation on 02/20/23 unless otherwise noted  PATIENT SURVEYS:  FOTO 34.53  COGNITION: Overall cognitive status: Within functional limits for tasks  assessed     SENSATION: Patient reports no numbness or tingling.   EDEMA:  No edema observed  PALPATION: TTP: right lumbar paraspinals, QL, gluteals, piriformis, TFL, iliotibial band, and quadriceps  LUMBAR JOINT MOBILITY:  Hypomobile and "tender" throughout   LUMBAR ROM:   Active  A/PROM  eval  Flexion 20; familiar back and RLE pain   Extension 6; familiar back and RLE pain   Right lateral flexion   Left lateral flexion   Right rotation 50% limited; right low back pain  Left rotation 50% limited   (Blank rows = not tested)   LOWER EXTREMITY ROM: WFL for activities assessed  LOWER EXTREMITY MMT:  MMT Right eval Left eval  Hip flexion 4-/5; "pulling" 4-/5  Hip extension    Hip abduction    Hip adduction    Hip internal rotation    Hip external rotation    Knee flexion 4/5; "pulling, but not as bad"  4/5  Knee extension 3+/5:/; "pulling" 4-/5  Ankle dorsiflexion 3+/5 3+/5  Ankle plantarflexion    Ankle inversion    Ankle eversion     (Blank rows = not tested)  FUNCTIONAL TESTS:  Sit to stand transfer: required UE support and painful  Bed mobility: required modA and painful   GAIT: Assistive device utilized: Quad cane small base Level of assistance: Modified independence Comments: left trendelenburg   TODAY'S TREATMENT:                                                                                                                              DATE:                                     02/24/23 EXERCISE LOG  Exercise Repetitions and Resistance Comments  Nustep  L3 x 17 minutes   Slouch overcorrect 2 minutes "Slight pull on the right side" (low back)  Seated clams  Red t-band x 25 reps    Seated hip ADD isometric 2 minutes w/ 5 second hold   LAQ 2 minutes    Gastroc stretch  2 minutes   Standing heel raise  20 reps    Standing marching 2 minutes     Blank cell = exercise not performed today   PATIENT EDUCATION:  Education details: Plan of care,  healing, prognosis, anatomy, objective findings, and goals for  therapy Person educated: Patient Education method: Explanation Education comprehension: verbalized understanding  HOME EXERCISE PROGRAM:   ASSESSMENT:  CLINICAL IMPRESSION: Patient was introduced to multiple new interventions for reduced lumbar and lower extremity pain. She required minimal cueing with today's new interventions for proper exercise performance. She experienced a mild increase in "pulling" in her lumbar spine with today's seated interventions, but this did not limit her ability to complete any of today's interventions. She reported that her back felt "looser" upon the conclusion of treatment. She continues to require skilled physical therapy to address her remaining impairments to return to her prior level of function.      OBJECTIVE IMPAIRMENTS: Abnormal gait, decreased activity tolerance, decreased mobility, difficulty walking, decreased ROM, decreased strength, hypomobility, impaired tone, and pain.   ACTIVITY LIMITATIONS: carrying, lifting, bending, standing, stairs, transfers, bed mobility, and locomotion level  PARTICIPATION LIMITATIONS: meal prep, cleaning, laundry, shopping, community activity, and yard work  PERSONAL FACTORS: Age and 3+ comorbidities: Hypertension, diabetes, osteopenia, osteoarthritis, anxiety, and depression  are also affecting patient's functional outcome.   REHAB POTENTIAL: Good  CLINICAL DECISION MAKING: Evolving/moderate complexity  EVALUATION COMPLEXITY: Moderate   GOALS: Goals reviewed with patient? Yes  LONG TERM GOALS: Target date: 03/20/23  Patient will be independent with her home exercise program. Baseline:  Goal status: INITIAL  2.  Patient will be able to complete her daily activities without her familiar pain exceeding 3/10.  Baseline:  Goal status: INITIAL  3.  Patient will be able to transfer from supine to sitting independently for improved bed  mobility. Baseline:  Goal status: INITIAL  4.  Patient will be able to ambulate with her assistive device with no significant gait deviations for improved mobility. Baseline:  Goal status: INITIAL  5.  Patient will be able to walk for at least 20 minutes without being limited by her familiar symptoms. Baseline:  Goal status: INITIAL  PLAN:  PT FREQUENCY: 2x/week  PT DURATION: 4 weeks  PLANNED INTERVENTIONS: Therapeutic exercises, Therapeutic activity, Neuromuscular re-education, Balance training, Gait training, Patient/Family education, Self Care, Joint mobilization, Stair training, Electrical stimulation, Spinal mobilization, Cryotherapy, Moist heat, Manual therapy, and Re-evaluation  PLAN FOR NEXT SESSION: NuStep, isometrics, lower trunk rotations, lower extremity strengthening, and modalities as needed   Granville Lewis, PT 02/24/2023, 12:23 PM

## 2023-02-24 NOTE — Telephone Encounter (Signed)
Patient's daughter is returning call. Please return call to daughter at [phone]. # on file for daughter was incorrect.

## 2023-02-25 NOTE — Telephone Encounter (Signed)
Unable to reach pt or leave a message, no mailbox 

## 2023-02-25 NOTE — Addendum Note (Signed)
Addended by: Freddi Starr on: 02/25/2023 08:34 AM   Modules accepted: Orders

## 2023-02-26 NOTE — Telephone Encounter (Signed)
Spoke with pt daughter, Aware of dr crenshaw's recommendations.  

## 2023-02-27 ENCOUNTER — Ambulatory Visit: Payer: Medicare Other

## 2023-02-27 DIAGNOSIS — M79604 Pain in right leg: Secondary | ICD-10-CM | POA: Diagnosis not present

## 2023-02-27 DIAGNOSIS — M5459 Other low back pain: Secondary | ICD-10-CM

## 2023-02-27 DIAGNOSIS — M25551 Pain in right hip: Secondary | ICD-10-CM | POA: Diagnosis not present

## 2023-02-27 DIAGNOSIS — M79651 Pain in right thigh: Secondary | ICD-10-CM | POA: Diagnosis not present

## 2023-02-27 NOTE — Therapy (Signed)
OUTPATIENT PHYSICAL THERAPY LOWER EXTREMITY TREATMENT   Patient Name: Misty Smith MRN: [id number] DOB:1940-09-03, 82 y.o., female Today's Date: 02/27/2023  END OF SESSION:  PT End of Session - 02/27/23 0846     Visit Number 3    Number of Visits 8    Date for PT Re-Evaluation 05/01/23    PT Start Time 0800    PT Stop Time 0845    PT Time Calculation (min) 45 min    Activity Tolerance Patient tolerated treatment well    Behavior During Therapy WFL for tasks assessed/performed              Past Medical History:  Diagnosis Date   Allergy    Anxiety    Arthritis    Bronchitis    Cataract    Colovaginal fistula    Depression    Diabetes mellitus without complication (HCC)    Dysrhythmia    palpitations, occasional PVCs; Dr Jens Som cardiologist   Esophageal stricture    Fractured elbow 1970's   GERD (gastroesophageal reflux disease)    H/O hiatal hernia    Hyperlipidemia    Hypertension    Osteopenia    Perimenopausal vasomotor symptoms    Postmenopausal HRT (hormone replacement therapy)    Pre-diabetes    Stress    Subacute lupus erythematosus    Dr. Terri Piedra   Past Surgical History:  Procedure Laterality Date   abdominal bypass  1987   fibroids    ABDOMINAL HYSTERECTOMY     partial   bilateral tubal ligtation  1972   CHOLECYSTECTOMY N/A 03/21/2014   Procedure: LAPAROSCOPIC CHOLECYSTECTOMY WITH INTRAOPERATIVE CHOLANGIOGRAM;  Surgeon: Kandis Cocking, MD;  Location: [facility] OR;  Service: General;  Laterality: N/A;   COLONOSCOPY     elbow Right 1976   surgery after fracture of elbow   EYE SURGERY Bilateral    cataracts   ROTATOR CUFF REPAIR Right 03/29/2018   TONSILLECTOMY     TUBAL LIGATION     Patient Active Problem List   Diagnosis Date Noted   Osteoarthritis of knee 09/11/2021   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   Upper airway cough syndrome 02/08/2021   Type 2 diabetes mellitus with other specified complication (HCC) 01/30/2021   Lumbar  radiculopathy 01/11/2021   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   Impingement syndrome of shoulder region 09/26/2017   Vitamin D deficiency 03/18/2016   CAD (coronary artery disease) 12/30/2013   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   Osteopenia of the elderly 10/26/2013   Generalized anxiety disorder 09/21/2013   GERD (gastroesophageal reflux disease) 09/21/2013   Multiple pulmonary nodules determined by computed tomography of lung 09/21/2013   Hyperlipidemia 02/01/2009   Essential hypertension 02/01/2009   REFERRING PROVIDER: Arrie Senate, FNP   REFERRING DIAG: Pain of right lower extremity; Pain of right hip   THERAPY DIAG:  Other low back pain  Pain in right thigh  Rationale for Evaluation and Treatment: Rehabilitation  ONSET DATE: late May 2024  SUBJECTIVE:   SUBJECTIVE STATEMENT: Patient reports that she is a little sore from getting groceries the other day.   PERTINENT HISTORY: Hypertension, diabetes, osteopenia, osteoarthritis, anxiety, and depression PAIN:  Are you having pain? Yes: NPRS scale: no pain score provided/10 Pain location: low back and right leg to the knee Pain description: intermittent throbbing Aggravating factors: walking (about 10 minutes), housework, bed mobility Relieving factors: ice and heat   PRECAUTIONS: None  WEIGHT BEARING RESTRICTIONS: No  FALLS:  Has patient fallen in  last 6 months? No  LIVING ENVIRONMENT: Lives with: lives with their family Lives in: House/apartment Stairs: Yes: Internal: she uses her chair lift at home steps;   Has following equipment at home: Quad cane small base  OCCUPATION: retired  PLOF: Independent  PATIENT GOALS: reduced pain and be able to walk longer  NEXT MD VISIT: 04/06/23  OBJECTIVE: all objective measures were assessed at her initial evaluation on 02/20/23 unless otherwise noted  PATIENT SURVEYS:  FOTO 34.53  COGNITION: Overall cognitive status: Within  functional limits for tasks assessed     SENSATION: Patient reports no numbness or tingling.   EDEMA:  No edema observed  PALPATION: TTP: right lumbar paraspinals, QL, gluteals, piriformis, TFL, iliotibial band, and quadriceps  LUMBAR JOINT MOBILITY:  Hypomobile and "tender" throughout   LUMBAR ROM:   Active  A/PROM  eval  Flexion 20; familiar back and RLE pain   Extension 6; familiar back and RLE pain   Right lateral flexion   Left lateral flexion   Right rotation 50% limited; right low back pain  Left rotation 50% limited   (Blank rows = not tested)   LOWER EXTREMITY ROM: WFL for activities assessed  LOWER EXTREMITY MMT:  MMT Right eval Left eval  Hip flexion 4-/5; "pulling" 4-/5  Hip extension    Hip abduction    Hip adduction    Hip internal rotation    Hip external rotation    Knee flexion 4/5; "pulling, but not as bad"  4/5  Knee extension 3+/5:/; "pulling" 4-/5  Ankle dorsiflexion 3+/5 3+/5  Ankle plantarflexion    Ankle inversion    Ankle eversion     (Blank rows = not tested)  FUNCTIONAL TESTS:  Sit to stand transfer: required UE support and painful  Bed mobility: required modA and painful   GAIT: Assistive device utilized: Quad cane small base Level of assistance: Modified independence Comments: left trendelenburg   TODAY'S TREATMENT:                                                                                                                              DATE:                                     02/27/23 EXERCISE LOG  Exercise Repetitions and Resistance Comments  Nustep L3 x 17 minutes   Seated HS curl Green t-band x 20 reps each    Seated HS stretch  3 x 30 seconds each    Seated marching  3 minutes    Seated hip ADD isometric  15 reps w/ 5 second hold    Static stance on foam  3 minutes  Intermittent UE support   Blank cell = exercise not performed today  02/24/23 EXERCISE LOG  Exercise Repetitions  and Resistance Comments  Nustep  L3 x 17 minutes   Slouch overcorrect 2 minutes "Slight pull on the right side" (low back)  Seated clams  Red t-band x 25 reps    Seated hip ADD isometric 2 minutes w/ 5 second hold   LAQ 2 minutes    Gastroc stretch  2 minutes   Standing heel raise  20 reps    Standing marching 2 minutes     Blank cell = exercise not performed today   PATIENT EDUCATION:  Education details: Plan of care, healing, prognosis, anatomy, objective findings, and goals for therapy Person educated: Patient Education method: Explanation Education comprehension: verbalized understanding  HOME EXERCISE PROGRAM:   ASSESSMENT:  CLINICAL IMPRESSION: Patient was progressed with multiple new interventions for improved lower extremity strength and stability. She required minimal cueing with seated hamstring curls for improved eccentric hamstring control needed for functional mobility. She experienced a mild increase in her familiar pain with seated hip adduction isometric, but this resolved after this after this intervention. She reported feeling better upon the conclusion of treatment. She continues to require skilled physical therapy to address her remaining impairments to return to her prior level of function  OBJECTIVE IMPAIRMENTS: Abnormal gait, decreased activity tolerance, decreased mobility, difficulty walking, decreased ROM, decreased strength, hypomobility, impaired tone, and pain.   ACTIVITY LIMITATIONS: carrying, lifting, bending, standing, stairs, transfers, bed mobility, and locomotion level  PARTICIPATION LIMITATIONS: meal prep, cleaning, laundry, shopping, community activity, and yard work  PERSONAL FACTORS: Age and 3+ comorbidities: Hypertension, diabetes, osteopenia, osteoarthritis, anxiety, and depression  are also affecting patient's functional outcome.   REHAB POTENTIAL: Good  CLINICAL DECISION MAKING: Evolving/moderate complexity  EVALUATION COMPLEXITY:  Moderate   GOALS: Goals reviewed with patient? Yes  LONG TERM GOALS: Target date: 03/20/23  Patient will be independent with her home exercise program. Baseline:  Goal status: INITIAL  2.  Patient will be able to complete her daily activities without her familiar pain exceeding 3/10.  Baseline:  Goal status: INITIAL  3.  Patient will be able to transfer from supine to sitting independently for improved bed mobility. Baseline:  Goal status: INITIAL  4.  Patient will be able to ambulate with her assistive device with no significant gait deviations for improved mobility. Baseline:  Goal status: INITIAL  5.  Patient will be able to walk for at least 20 minutes without being limited by her familiar symptoms. Baseline:  Goal status: INITIAL  PLAN:  PT FREQUENCY: 2x/week  PT DURATION: 4 weeks  PLANNED INTERVENTIONS: Therapeutic exercises, Therapeutic activity, Neuromuscular re-education, Balance training, Gait training, Patient/Family education, Self Care, Joint mobilization, Stair training, Electrical stimulation, Spinal mobilization, Cryotherapy, Moist heat, Manual therapy, and Re-evaluation  PLAN FOR NEXT SESSION: NuStep, isometrics, lower trunk rotations, lower extremity strengthening, and modalities as needed   Granville Lewis, PT 02/27/2023, 8:54 AM

## 2023-03-03 ENCOUNTER — Ambulatory Visit: Payer: Medicare Other | Attending: Family Medicine

## 2023-03-03 DIAGNOSIS — M5459 Other low back pain: Secondary | ICD-10-CM | POA: Diagnosis not present

## 2023-03-03 DIAGNOSIS — M79651 Pain in right thigh: Secondary | ICD-10-CM | POA: Diagnosis not present

## 2023-03-03 NOTE — Therapy (Signed)
OUTPATIENT PHYSICAL THERAPY LOWER EXTREMITY TREATMENT   Patient Name: Misty Smith MRN: [id number] DOB:Jun 05, 1941, 82 y.o., female Today's Date: 03/03/2023  END OF SESSION:  PT End of Session - 03/03/23 0852     Visit Number 4    Number of Visits 8    Date for PT Re-Evaluation 05/01/23    PT Start Time 0850    PT Stop Time 0930    PT Time Calculation (min) 40 min    Activity Tolerance Patient tolerated treatment well    Behavior During Therapy [facility] for tasks assessed/performed              Past Medical History:  Diagnosis Date   Allergy    Anxiety    Arthritis    Bronchitis    Cataract    Colovaginal fistula    Depression    Diabetes mellitus without complication (HCC)    Dysrhythmia    palpitations, occasional PVCs; Dr Jens Som cardiologist   Esophageal stricture    Fractured elbow 1970's   GERD (gastroesophageal reflux disease)    H/O hiatal hernia    Hyperlipidemia    Hypertension    Osteopenia    Perimenopausal vasomotor symptoms    Postmenopausal HRT (hormone replacement therapy)    Pre-diabetes    Stress    Subacute lupus erythematosus    Dr. Terri Piedra   Past Surgical History:  Procedure Laterality Date   abdominal bypass  1987   fibroids    ABDOMINAL HYSTERECTOMY     partial   bilateral tubal ligtation  1972   CHOLECYSTECTOMY N/A 03/21/2014   Procedure: LAPAROSCOPIC CHOLECYSTECTOMY WITH INTRAOPERATIVE CHOLANGIOGRAM;  Surgeon: Kandis Cocking, MD;  Location: [facility] OR;  Service: General;  Laterality: N/A;   COLONOSCOPY     elbow Right 1976   surgery after fracture of elbow   EYE SURGERY Bilateral    cataracts   ROTATOR CUFF REPAIR Right 03/29/2018   TONSILLECTOMY     TUBAL LIGATION     Patient Active Problem List   Diagnosis Date Noted   Osteoarthritis of knee 09/11/2021   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   Upper airway cough syndrome 02/08/2021   Type 2 diabetes mellitus with other specified complication (HCC) 01/30/2021   Lumbar  radiculopathy 01/11/2021   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   Impingement syndrome of shoulder region 09/26/2017   Vitamin D deficiency 03/18/2016   CAD (coronary artery disease) 12/30/2013   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   Osteopenia of the elderly 10/26/2013   Generalized anxiety disorder 09/21/2013   GERD (gastroesophageal reflux disease) 09/21/2013   Multiple pulmonary nodules determined by computed tomography of lung 09/21/2013   Hyperlipidemia 02/01/2009   Essential hypertension 02/01/2009   REFERRING PROVIDER: Arrie Senate, FNP   REFERRING DIAG: Pain of right lower extremity; Pain of right hip   THERAPY DIAG:  Other low back pain  Pain in right thigh  Rationale for Evaluation and Treatment: Rehabilitation  ONSET DATE: late May 2024  SUBJECTIVE:   SUBJECTIVE STATEMENT: Pt reports low back pain today.  PERTINENT HISTORY: Hypertension, diabetes, osteopenia, osteoarthritis, anxiety, and depression PAIN:  Are you having pain? Yes: NPRS scale: no pain score provided/10 Pain location: low back  Pain description: intermittent throbbing Aggravating factors: walking (about 10 minutes), housework, bed mobility Relieving factors: ice and heat   PRECAUTIONS: None  WEIGHT BEARING RESTRICTIONS: No  FALLS:  Has patient fallen in last 6 months? No  LIVING ENVIRONMENT: Lives with: lives with their family Lives  in: House/apartment Stairs: Yes: Internal: she uses her chair lift at home steps;   Has following equipment at home: Quad cane small base  OCCUPATION: retired  PLOF: Independent  PATIENT GOALS: reduced pain and be able to walk longer  NEXT MD VISIT: 04/06/23  OBJECTIVE: all objective measures were assessed at her initial evaluation on 02/20/23 unless otherwise noted  PATIENT SURVEYS:  FOTO 34.53  COGNITION: Overall cognitive status: Within functional limits for tasks assessed     SENSATION: Patient reports no  numbness or tingling.   EDEMA:  No edema observed  PALPATION: TTP: right lumbar paraspinals, QL, gluteals, piriformis, TFL, iliotibial band, and quadriceps  LUMBAR JOINT MOBILITY:  Hypomobile and "tender" throughout   LUMBAR ROM:   Active  A/PROM  eval  Flexion 20; familiar back and RLE pain   Extension 6; familiar back and RLE pain   Right lateral flexion   Left lateral flexion   Right rotation 50% limited; right low back pain  Left rotation 50% limited   (Blank rows = not tested)   LOWER EXTREMITY ROM: WFL for activities assessed  LOWER EXTREMITY MMT:  MMT Right eval Left eval  Hip flexion 4-/5; "pulling" 4-/5  Hip extension    Hip abduction    Hip adduction    Hip internal rotation    Hip external rotation    Knee flexion 4/5; "pulling, but not as bad"  4/5  Knee extension 3+/5:/; "pulling" 4-/5  Ankle dorsiflexion 3+/5 3+/5  Ankle plantarflexion    Ankle inversion    Ankle eversion     (Blank rows = not tested)  FUNCTIONAL TESTS:  Sit to stand transfer: required UE support and painful  Bed mobility: required modA and painful   GAIT: Assistive device utilized: Quad cane small base Level of assistance: Modified independence Comments: left trendelenburg   TODAY'S TREATMENT:                                                                                                                              DATE:                                    03/02/23 EXERCISE LOG  Exercise Repetitions and Resistance Comments  Nustep L3 x 17 minutes   LAQs 2# x 20 reps bil   Seated HS curl Green t-band x 20 reps each    Seated HS stretch  3 x 30 seconds each    Seated marching  1.5 minutes    Seated hip ADD isometric  2 mins   Clam Red x 2 mins   STS 2 sets of 5 reps Intermittent UE support  Rockerboard  3 mins   Static standing Airex x 3 mins    Blank cell = exercise not performed today   PATIENT EDUCATION:  Education details: Plan of care, healing, prognosis,  anatomy, objective  findings, and goals for therapy Person educated: Patient Education method: Explanation Education comprehension: verbalized understanding  HOME EXERCISE PROGRAM:   ASSESSMENT:  CLINICAL IMPRESSION: Pt arrives for today's treatment session five minutes late reporting moderate low back pain.  Pt able to tolerate introduction to several new seated and standing exercises today without discomfort.  Pt requiring min cues with all newly added exercises for proper technique and posture.  Pt reports slight increase in fatigue at completion of today's treatment session, but denies any increase in pain.   OBJECTIVE IMPAIRMENTS: Abnormal gait, decreased activity tolerance, decreased mobility, difficulty walking, decreased ROM, decreased strength, hypomobility, impaired tone, and pain.   ACTIVITY LIMITATIONS: carrying, lifting, bending, standing, stairs, transfers, bed mobility, and locomotion level  PARTICIPATION LIMITATIONS: meal prep, cleaning, laundry, shopping, community activity, and yard work  PERSONAL FACTORS: Age and 3+ comorbidities: Hypertension, diabetes, osteopenia, osteoarthritis, anxiety, and depression  are also affecting patient's functional outcome.   REHAB POTENTIAL: Good  CLINICAL DECISION MAKING: Evolving/moderate complexity  EVALUATION COMPLEXITY: Moderate   GOALS: Goals reviewed with patient? Yes  LONG TERM GOALS: Target date: 03/20/23  Patient will be independent with her home exercise program. Baseline:  Goal status: INITIAL  2.  Patient will be able to complete her daily activities without her familiar pain exceeding 3/10.  Baseline:  Goal status: INITIAL  3.  Patient will be able to transfer from supine to sitting independently for improved bed mobility. Baseline:  Goal status: INITIAL  4.  Patient will be able to ambulate with her assistive device with no significant gait deviations for improved mobility. Baseline:  Goal status:  INITIAL  5.  Patient will be able to walk for at least 20 minutes without being limited by her familiar symptoms. Baseline:  Goal status: INITIAL  PLAN:  PT FREQUENCY: 2x/week  PT DURATION: 4 weeks  PLANNED INTERVENTIONS: Therapeutic exercises, Therapeutic activity, Neuromuscular re-education, Balance training, Gait training, Patient/Family education, Self Care, Joint mobilization, Stair training, Electrical stimulation, Spinal mobilization, Cryotherapy, Moist heat, Manual therapy, and Re-evaluation  PLAN FOR NEXT SESSION: NuStep, isometrics, lower trunk rotations, lower extremity strengthening, and modalities as needed   Newman Pies, PTA 03/03/2023, 9:43 AM

## 2023-03-06 ENCOUNTER — Ambulatory Visit: Payer: Medicare Other | Admitting: Physical Therapy

## 2023-03-06 ENCOUNTER — Encounter: Payer: Self-pay | Admitting: Physical Therapy

## 2023-03-06 DIAGNOSIS — M79651 Pain in right thigh: Secondary | ICD-10-CM | POA: Diagnosis not present

## 2023-03-06 DIAGNOSIS — M5459 Other low back pain: Secondary | ICD-10-CM

## 2023-03-06 NOTE — Therapy (Signed)
OUTPATIENT PHYSICAL THERAPY LOWER EXTREMITY TREATMENT   Patient Name: Misty Smith MRN: [id number] DOB:09-Apr-1941, 82 y.o., female Today's Date: 03/06/2023  END OF SESSION:  PT End of Session - 03/06/23 0851     Visit Number 5    Number of Visits 8    Date for PT Re-Evaluation 05/01/23    PT Start Time 0847    PT Stop Time 0930    PT Time Calculation (min) 43 min    Activity Tolerance Patient tolerated treatment well    Behavior During Therapy [facility] for tasks assessed/performed            Past Medical History:  Diagnosis Date   Allergy    Anxiety    Arthritis    Bronchitis    Cataract    Colovaginal fistula    Depression    Diabetes mellitus without complication (HCC)    Dysrhythmia    palpitations, occasional PVCs; Dr Jens Som cardiologist   Esophageal stricture    Fractured elbow 1970's   GERD (gastroesophageal reflux disease)    H/O hiatal hernia    Hyperlipidemia    Hypertension    Osteopenia    Perimenopausal vasomotor symptoms    Postmenopausal HRT (hormone replacement therapy)    Pre-diabetes    Stress    Subacute lupus erythematosus    Dr. Terri Piedra   Past Surgical History:  Procedure Laterality Date   abdominal bypass  1987   fibroids    ABDOMINAL HYSTERECTOMY     partial   bilateral tubal ligtation  1972   CHOLECYSTECTOMY N/A 03/21/2014   Procedure: LAPAROSCOPIC CHOLECYSTECTOMY WITH INTRAOPERATIVE CHOLANGIOGRAM;  Surgeon: Kandis Cocking, MD;  Location: [facility] OR;  Service: General;  Laterality: N/A;   COLONOSCOPY     elbow Right 1976   surgery after fracture of elbow   EYE SURGERY Bilateral    cataracts   ROTATOR CUFF REPAIR Right 03/29/2018   TONSILLECTOMY     TUBAL LIGATION     Patient Active Problem List   Diagnosis Date Noted   Osteoarthritis of knee 09/11/2021   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   Upper airway cough syndrome 02/08/2021   Type 2 diabetes mellitus with other specified complication (HCC) 01/30/2021   Lumbar  radiculopathy 01/11/2021   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   Impingement syndrome of shoulder region 09/26/2017   Vitamin D deficiency 03/18/2016   CAD (coronary artery disease) 12/30/2013   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   Osteopenia of the elderly 10/26/2013   Generalized anxiety disorder 09/21/2013   GERD (gastroesophageal reflux disease) 09/21/2013   Multiple pulmonary nodules determined by computed tomography of lung 09/21/2013   Hyperlipidemia 02/01/2009   Essential hypertension 02/01/2009   REFERRING PROVIDER: Arrie Senate, FNP   REFERRING DIAG: Pain of right lower extremity; Pain of right hip   THERAPY DIAG:  Other low back pain  Pain in right thigh  Rationale for Evaluation and Treatment: Rehabilitation  ONSET DATE: late May 2024  SUBJECTIVE:   SUBJECTIVE STATEMENT: Reports lumbar stiffness on arrival.  PERTINENT HISTORY: Hypertension, diabetes, osteopenia, osteoarthritis, anxiety, and depression  PAIN:  Are you having pain? No  PRECAUTIONS: None  WEIGHT BEARING RESTRICTIONS: No  FALLS:  Has patient fallen in last 6 months? No  PATIENT GOALS: reduced pain and be able to walk longer  NEXT MD VISIT: 04/06/23  OBJECTIVE: all objective measures were assessed at her initial evaluation on 02/20/23 unless otherwise noted  PATIENT SURVEYS:  FOTO 34.53  PALPATION: TTP: right  lumbar paraspinals, QL, gluteals, piriformis, TFL, iliotibial band, and quadriceps  LUMBAR JOINT MOBILITY:  Hypomobile and "tender" throughout   LUMBAR ROM:   Active  A/PROM  eval  Flexion 20; familiar back and RLE pain   Extension 6; familiar back and RLE pain   Right lateral flexion   Left lateral flexion   Right rotation 50% limited; right low back pain  Left rotation 50% limited   (Blank rows = not tested)   LOWER EXTREMITY ROM: WFL for activities assessed  LOWER EXTREMITY MMT:  MMT Right eval Left eval  Hip flexion 4-/5;  "pulling" 4-/5  Hip extension    Hip abduction    Hip adduction    Hip internal rotation    Hip external rotation    Knee flexion 4/5; "pulling, but not as bad"  4/5  Knee extension 3+/5:/; "pulling" 4-/5  Ankle dorsiflexion 3+/5 3+/5  Ankle plantarflexion    Ankle inversion    Ankle eversion     (Blank rows = not tested)  FUNCTIONAL TESTS:  Sit to stand transfer: required UE support and painful  Bed mobility: required modA and painful   GAIT: Assistive device utilized: Quad cane small base Level of assistance: Modified independence Comments: left trendelenburg  TODAY'S TREATMENT:                                                                                                                              DATE:    03/06/23 EXERCISE LOG  Exercise Repetitions and Resistance Comments  Nustep L2 x 19 minutes   LAQs 3# x 20 reps bil   Seated marching  3# x20 reps   Seated hip ADD isometric  2 mins   Clam Red x 30 reps   STS 2 sets of 5 reps Intermittent UE support  Static standing Airex x 1 mins    Blank cell = exercise not performed today   PATIENT EDUCATION:  Education details: Plan of care, healing, prognosis, anatomy, objective findings, and goals for therapy Person educated: Patient Education method: Explanation Education comprehension: verbalized understanding  HOME EXERCISE PROGRAM:  ASSESSMENT:  CLINICAL IMPRESSION: Patient presented in clinic with reports of lumbar stiffness but normally is upon waking. Patient fatigued with therex throughout session especially in standing. Patient using SPC for gait.  OBJECTIVE IMPAIRMENTS: Abnormal gait, decreased activity tolerance, decreased mobility, difficulty walking, decreased ROM, decreased strength, hypomobility, impaired tone, and pain.   ACTIVITY LIMITATIONS: carrying, lifting, bending, standing, stairs, transfers, bed mobility, and locomotion level  PARTICIPATION LIMITATIONS: meal prep, cleaning, laundry, shopping,  community activity, and yard work  PERSONAL FACTORS: Age and 3+ comorbidities: Hypertension, diabetes, osteopenia, osteoarthritis, anxiety, and depression  are also affecting patient's functional outcome.   REHAB POTENTIAL: Good  CLINICAL DECISION MAKING: Evolving/moderate complexity  EVALUATION COMPLEXITY: Moderate  GOALS: Goals reviewed with patient? Yes  LONG TERM GOALS: Target date: 03/20/23  Patient will be independent with her home exercise program. Baseline:  Goal  status: IN PROGRESS  2.  Patient will be able to complete her daily activities without her familiar pain exceeding 3/10.  Baseline:  Goal status: IN PROGRESS  3.  Patient will be able to transfer from supine to sitting independently for improved bed mobility. Baseline:  Goal status: IN PROGRESS  4.  Patient will be able to ambulate with her assistive device with no significant gait deviations for improved mobility. Baseline:  Goal status: IN PROGRESS  5.  Patient will be able to walk for at least 20 minutes without being limited by her familiar symptoms. Baseline:  Goal status: IN PROGRESS  PLAN:  PT FREQUENCY: 2x/week  PT DURATION: 4 weeks  PLANNED INTERVENTIONS: Therapeutic exercises, Therapeutic activity, Neuromuscular re-education, Balance training, Gait training, Patient/Family education, Self Care, Joint mobilization, Stair training, Electrical stimulation, Spinal mobilization, Cryotherapy, Moist heat, Manual therapy, and Re-evaluation  PLAN FOR NEXT SESSION: NuStep, isometrics, lower trunk rotations, lower extremity strengthening, and modalities as needed  Marvell Fuller, PTA 03/06/2023, 9:36 AM

## 2023-03-09 ENCOUNTER — Inpatient Hospital Stay: Admission: RE | Admit: 2023-03-09 | Payer: 59 | Source: Ambulatory Visit

## 2023-03-10 ENCOUNTER — Ambulatory Visit: Payer: Medicare Other

## 2023-03-10 DIAGNOSIS — M79651 Pain in right thigh: Secondary | ICD-10-CM

## 2023-03-10 DIAGNOSIS — M5459 Other low back pain: Secondary | ICD-10-CM | POA: Diagnosis not present

## 2023-03-10 NOTE — Therapy (Signed)
OUTPATIENT PHYSICAL THERAPY LOWER EXTREMITY TREATMENT   Patient Name: Misty Smith MRN: [id number] DOB:Sep 28, 1940, 82 y.o., female Today's Date: 03/10/2023  END OF SESSION:  PT End of Session - 03/10/23 0851     Visit Number 6    Number of Visits 8    Date for PT Re-Evaluation 05/01/23    PT Start Time 0845    PT Stop Time 0930    PT Time Calculation (min) 45 min    Activity Tolerance Patient tolerated treatment well    Behavior During Therapy [facility] for tasks assessed/performed            Past Medical History:  Diagnosis Date   Allergy    Anxiety    Arthritis    Bronchitis    Cataract    Colovaginal fistula    Depression    Diabetes mellitus without complication (HCC)    Dysrhythmia    palpitations, occasional PVCs; Dr Jens Som cardiologist   Esophageal stricture    Fractured elbow 1970's   GERD (gastroesophageal reflux disease)    H/O hiatal hernia    Hyperlipidemia    Hypertension    Osteopenia    Perimenopausal vasomotor symptoms    Postmenopausal HRT (hormone replacement therapy)    Pre-diabetes    Stress    Subacute lupus erythematosus    Dr. Terri Piedra   Past Surgical History:  Procedure Laterality Date   abdominal bypass  1987   fibroids    ABDOMINAL HYSTERECTOMY     partial   bilateral tubal ligtation  1972   CHOLECYSTECTOMY N/A 03/21/2014   Procedure: LAPAROSCOPIC CHOLECYSTECTOMY WITH INTRAOPERATIVE CHOLANGIOGRAM;  Surgeon: Kandis Cocking, MD;  Location: [facility] OR;  Service: General;  Laterality: N/A;   COLONOSCOPY     elbow Right 1976   surgery after fracture of elbow   EYE SURGERY Bilateral    cataracts   ROTATOR CUFF REPAIR Right 03/29/2018   TONSILLECTOMY     TUBAL LIGATION     Patient Active Problem List   Diagnosis Date Noted   Osteoarthritis of knee 09/11/2021   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   Upper airway cough syndrome 02/08/2021   Type 2 diabetes mellitus with other specified complication (HCC) 01/30/2021   Lumbar  radiculopathy 01/11/2021   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   Impingement syndrome of shoulder region 09/26/2017   Vitamin D deficiency 03/18/2016   CAD (coronary artery disease) 12/30/2013   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   Osteopenia of the elderly 10/26/2013   Generalized anxiety disorder 09/21/2013   GERD (gastroesophageal reflux disease) 09/21/2013   Multiple pulmonary nodules determined by computed tomography of lung 09/21/2013   Hyperlipidemia 02/01/2009   Essential hypertension 02/01/2009   REFERRING PROVIDER: Arrie Senate, FNP   REFERRING DIAG: Pain of right lower extremity; Pain of right hip   THERAPY DIAG:  Other low back pain  Pain in right thigh  Rationale for Evaluation and Treatment: Rehabilitation  ONSET DATE: late May 2024  SUBJECTIVE:   SUBJECTIVE STATEMENT: Patient reports that she feels good today, but her back is hurting a little bit. She notes that she has an injection in her knees on Thursday (03/12/23).  PERTINENT HISTORY: Hypertension, diabetes, osteopenia, osteoarthritis, anxiety, and depression  PAIN:  Are you having pain? No  PRECAUTIONS: None  WEIGHT BEARING RESTRICTIONS: No  FALLS:  Has patient fallen in last 6 months? No  PATIENT GOALS: reduced pain and be able to walk longer  NEXT MD VISIT: 04/06/23  OBJECTIVE: all  objective measures were assessed at her initial evaluation on 02/20/23 unless otherwise noted  PATIENT SURVEYS:  FOTO 34.53  PALPATION: TTP: right lumbar paraspinals, QL, gluteals, piriformis, TFL, iliotibial band, and quadriceps  LUMBAR JOINT MOBILITY:  Hypomobile and "tender" throughout   LUMBAR ROM:   Active  A/PROM  eval  Flexion 20; familiar back and RLE pain   Extension 6; familiar back and RLE pain   Right lateral flexion   Left lateral flexion   Right rotation 50% limited; right low back pain  Left rotation 50% limited   (Blank rows = not tested)   LOWER  EXTREMITY ROM: WFL for activities assessed  LOWER EXTREMITY MMT:  MMT Right eval Left eval  Hip flexion 4-/5; "pulling" 4-/5  Hip extension    Hip abduction    Hip adduction    Hip internal rotation    Hip external rotation    Knee flexion 4/5; "pulling, but not as bad"  4/5  Knee extension 3+/5:/; "pulling" 4-/5  Ankle dorsiflexion 3+/5 3+/5  Ankle plantarflexion    Ankle inversion    Ankle eversion     (Blank rows = not tested)  FUNCTIONAL TESTS:  Sit to stand transfer: required UE support and painful  Bed mobility: required modA and painful   GAIT: Assistive device utilized: Quad cane small base Level of assistance: Modified independence Comments: left trendelenburg  TODAY'S TREATMENT:                                                                                                                              DATE:                                   03/10/23 EXERCISE LOG  Exercise Repetitions and Resistance Comments  Nustep  L4 x 17 minutes   LAQ 3# x 3 minutes Alternating LE  Seated marching  3# x 20 reps each  Alternating LE; low back pain  Slouch overcorrect 2 minutes   Seated hip ADD isometric  2.5 minutes w/ 5 second hold    Seated HS stretch  3 x 30 seconds each   Sit to stand  2 x 6 reps  From standard height chair  Seated heel/toe raises  3 minutes    Blank cell = exercise not performed today       03/06/23 EXERCISE LOG  Exercise Repetitions and Resistance Comments  Nustep L2 x 19 minutes   LAQs 3# x 20 reps bil   Seated marching  3# x20 reps   Seated hip ADD isometric  2 mins   Clam Red x 30 reps   STS 2 sets of 5 reps Intermittent UE support  Static standing Airex x 1 mins    Blank cell = exercise not performed today   PATIENT EDUCATION:  Education details: Plan of care, healing, prognosis, anatomy, objective findings, and goals  for therapy Person educated: Patient Education method: Explanation Education comprehension: verbalized  understanding  HOME EXERCISE PROGRAM:  ASSESSMENT:  CLINICAL IMPRESSION: Patient presented to treatment with increased low back pain. She was introduced a seated slouch overcorrect and other new interventions for lumbar mobility and upright posture. She required minimal cueing with these interventions for proper pacing and exercise performance to avoid aggravating her familiar symptoms. She experienced a moderate increase her familiar back pain with seated marching, but this did not inhibit her ability to complete today's interventions. She reported feeling a little better upon the conclusion of treatment. She continues to require skilled physical therapy to address her remaining impairments to maximize her safety and functional mobility.   OBJECTIVE IMPAIRMENTS: Abnormal gait, decreased activity tolerance, decreased mobility, difficulty walking, decreased ROM, decreased strength, hypomobility, impaired tone, and pain.   ACTIVITY LIMITATIONS: carrying, lifting, bending, standing, stairs, transfers, bed mobility, and locomotion level  PARTICIPATION LIMITATIONS: meal prep, cleaning, laundry, shopping, community activity, and yard work  PERSONAL FACTORS: Age and 3+ comorbidities: Hypertension, diabetes, osteopenia, osteoarthritis, anxiety, and depression  are also affecting patient's functional outcome.   REHAB POTENTIAL: Good  CLINICAL DECISION MAKING: Evolving/moderate complexity  EVALUATION COMPLEXITY: Moderate  GOALS: Goals reviewed with patient? Yes  LONG TERM GOALS: Target date: 03/20/23  Patient will be independent with her home exercise program. Baseline:  Goal status: IN PROGRESS  2.  Patient will be able to complete her daily activities without her familiar pain exceeding 3/10.  Baseline:  Goal status: IN PROGRESS  3.  Patient will be able to transfer from supine to sitting independently for improved bed mobility. Baseline:  Goal status: IN PROGRESS  4.  Patient will be  able to ambulate with her assistive device with no significant gait deviations for improved mobility. Baseline:  Goal status: IN PROGRESS  5.  Patient will be able to walk for at least 20 minutes without being limited by her familiar symptoms. Baseline:  Goal status: IN PROGRESS  PLAN:  PT FREQUENCY: 2x/week  PT DURATION: 4 weeks  PLANNED INTERVENTIONS: Therapeutic exercises, Therapeutic activity, Neuromuscular re-education, Balance training, Gait training, Patient/Family education, Self Care, Joint mobilization, Stair training, Electrical stimulation, Spinal mobilization, Cryotherapy, Moist heat, Manual therapy, and Re-evaluation  PLAN FOR NEXT SESSION: NuStep, isometrics, lower trunk rotations, lower extremity strengthening, and modalities as needed  Granville Lewis, PT 03/10/2023, 9:41 AM

## 2023-03-12 DIAGNOSIS — M170 Bilateral primary osteoarthritis of knee: Secondary | ICD-10-CM | POA: Diagnosis not present

## 2023-03-13 ENCOUNTER — Ambulatory Visit: Payer: Medicare Other

## 2023-03-13 DIAGNOSIS — M79651 Pain in right thigh: Secondary | ICD-10-CM | POA: Diagnosis not present

## 2023-03-13 DIAGNOSIS — M5459 Other low back pain: Secondary | ICD-10-CM

## 2023-03-13 NOTE — Therapy (Signed)
OUTPATIENT PHYSICAL THERAPY LOWER EXTREMITY TREATMENT   Patient Name: Misty Smith MRN: [id number] DOB:October 31, 1940, 82 y.o., female Today's Date: 03/13/2023  END OF SESSION:  PT End of Session - 03/13/23 0848     Visit Number 7    Number of Visits 8    Date for PT Re-Evaluation 05/01/23    PT Start Time 0845    PT Stop Time 0930    PT Time Calculation (min) 45 min    Activity Tolerance Patient tolerated treatment well    Behavior During Therapy WFL for tasks assessed/performed             Past Medical History:  Diagnosis Date   Allergy    Anxiety    Arthritis    Bronchitis    Cataract    Colovaginal fistula    Depression    Diabetes mellitus without complication (HCC)    Dysrhythmia    palpitations, occasional PVCs; Dr Jens Som cardiologist   Esophageal stricture    Fractured elbow 1970's   GERD (gastroesophageal reflux disease)    H/O hiatal hernia    Hyperlipidemia    Hypertension    Osteopenia    Perimenopausal vasomotor symptoms    Postmenopausal HRT (hormone replacement therapy)    Pre-diabetes    Stress    Subacute lupus erythematosus    Dr. Terri Piedra   Past Surgical History:  Procedure Laterality Date   abdominal bypass  1987   fibroids    ABDOMINAL HYSTERECTOMY     partial   bilateral tubal ligtation  1972   CHOLECYSTECTOMY N/A 03/21/2014   Procedure: LAPAROSCOPIC CHOLECYSTECTOMY WITH INTRAOPERATIVE CHOLANGIOGRAM;  Surgeon: Kandis Cocking, MD;  Location: [facility] OR;  Service: General;  Laterality: N/A;   COLONOSCOPY     elbow Right 1976   surgery after fracture of elbow   EYE SURGERY Bilateral    cataracts   ROTATOR CUFF REPAIR Right 03/29/2018   TONSILLECTOMY     TUBAL LIGATION     Patient Active Problem List   Diagnosis Date Noted   Osteoarthritis of knee 09/11/2021   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   Upper airway cough syndrome 02/08/2021   Type 2 diabetes mellitus with other specified complication (HCC) 01/30/2021   Lumbar  radiculopathy 01/11/2021   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   Impingement syndrome of shoulder region 09/26/2017   Vitamin D deficiency 03/18/2016   CAD (coronary artery disease) 12/30/2013   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   Osteopenia of the elderly 10/26/2013   Generalized anxiety disorder 09/21/2013   GERD (gastroesophageal reflux disease) 09/21/2013   Multiple pulmonary nodules determined by computed tomography of lung 09/21/2013   Hyperlipidemia 02/01/2009   Essential hypertension 02/01/2009   REFERRING PROVIDER: Arrie Senate, FNP   REFERRING DIAG: Pain of right lower extremity; Pain of right hip   THERAPY DIAG:  Other low back pain  Pain in right thigh  Rationale for Evaluation and Treatment: Rehabilitation  ONSET DATE: late May 2024  SUBJECTIVE:   SUBJECTIVE STATEMENT: Patient reports that her back is a little stiff first thing in the morning, but she feels better today than she did last time.   PERTINENT HISTORY: Hypertension, diabetes, osteopenia, osteoarthritis, anxiety, and depression  PAIN:  Are you having pain? No  PRECAUTIONS: None  WEIGHT BEARING RESTRICTIONS: No  FALLS:  Has patient fallen in last 6 months? No  PATIENT GOALS: reduced pain and be able to walk longer  NEXT MD VISIT: 04/06/23  OBJECTIVE: all objective measures  were assessed at her initial evaluation on 02/20/23 unless otherwise noted  PATIENT SURVEYS:  FOTO 34.53  PALPATION: TTP: right lumbar paraspinals, QL, gluteals, piriformis, TFL, iliotibial band, and quadriceps  LUMBAR JOINT MOBILITY:  Hypomobile and "tender" throughout   LUMBAR ROM:   Active  A/PROM  eval  Flexion 20; familiar back and RLE pain   Extension 6; familiar back and RLE pain   Right lateral flexion   Left lateral flexion   Right rotation 50% limited; right low back pain  Left rotation 50% limited   (Blank rows = not tested)   LOWER EXTREMITY ROM: WFL for  activities assessed  LOWER EXTREMITY MMT:  MMT Right eval Left eval  Hip flexion 4-/5; "pulling" 4-/5  Hip extension    Hip abduction    Hip adduction    Hip internal rotation    Hip external rotation    Knee flexion 4/5; "pulling, but not as bad"  4/5  Knee extension 3+/5:/; "pulling" 4-/5  Ankle dorsiflexion 3+/5 3+/5  Ankle plantarflexion    Ankle inversion    Ankle eversion     (Blank rows = not tested)  FUNCTIONAL TESTS:  Sit to stand transfer: required UE support and painful  Bed mobility: required modA and painful   GAIT: Assistive device utilized: Quad cane small base Level of assistance: Modified independence Comments: left trendelenburg  TODAY'S TREATMENT:                                                                                                                              DATE:                                   03/13/23 EXERCISE LOG  Exercise Repetitions and Resistance Comments  Nustep  L4 x 15 minutes   LAQ 4# x 3 minutes Alternating LE  Seated marching 4# x 2.5 minutes Alternating LE  Static stance on foam  2 minutes  Intermittent UE support  Marching on foam 20 reps each  2 HHA to 1 HHA   Standing heel raise  20 reps    Standing hip ABD  25 reps each    Standing gastroc stretch  2 minutes   Sit to stand  2 x 7 reps Without UE support   Blank cell = exercise not performed today                                    03/10/23 EXERCISE LOG  Exercise Repetitions and Resistance Comments  Nustep  L4 x 17 minutes   LAQ 3# x 3 minutes Alternating LE  Seated marching  3# x 20 reps each  Alternating LE; low back pain  Slouch overcorrect 2 minutes   Seated hip ADD isometric  2.5 minutes w/ 5 second hold  Seated HS stretch  3 x 30 seconds each   Sit to stand  2 x 6 reps  From standard height chair  Seated heel/toe raises  3 minutes    Blank cell = exercise not performed today       03/06/23 EXERCISE LOG  Exercise Repetitions and Resistance Comments   Nustep L2 x 19 minutes   LAQs 3# x 20 reps bil   Seated marching  3# x20 reps   Seated hip ADD isometric  2 mins   Clam Red x 30 reps   STS 2 sets of 5 reps Intermittent UE support  Static standing Airex x 1 mins    Blank cell = exercise not performed today   PATIENT EDUCATION:  Education details: Plan of care, healing, prognosis, anatomy, objective findings, and goals for therapy Person educated: Patient Education method: Explanation Education comprehension: verbalized understanding  HOME EXERCISE PROGRAM:  ASSESSMENT:  CLINICAL IMPRESSION: Patient was progressed with multiple new standing interventions with moderate difficulty. She required minimal cueing with today's standing interventions for upright stance for improved awareness of her surroundings. She reported feeling alright upon the conclusion of treatment. She continues to require skilled physical therapy to address her remaining impairments to maximize her functional mobility.   OBJECTIVE IMPAIRMENTS: Abnormal gait, decreased activity tolerance, decreased mobility, difficulty walking, decreased ROM, decreased strength, hypomobility, impaired tone, and pain.   ACTIVITY LIMITATIONS: carrying, lifting, bending, standing, stairs, transfers, bed mobility, and locomotion level  PARTICIPATION LIMITATIONS: meal prep, cleaning, laundry, shopping, community activity, and yard work  PERSONAL FACTORS: Age and 3+ comorbidities: Hypertension, diabetes, osteopenia, osteoarthritis, anxiety, and depression  are also affecting patient's functional outcome.   REHAB POTENTIAL: Good  CLINICAL DECISION MAKING: Evolving/moderate complexity  EVALUATION COMPLEXITY: Moderate  GOALS: Goals reviewed with patient? Yes  LONG TERM GOALS: Target date: 03/20/23  Patient will be independent with her home exercise program. Baseline:  Goal status: IN PROGRESS  2.  Patient will be able to complete her daily activities without her familiar pain  exceeding 3/10.  Baseline:  Goal status: IN PROGRESS  3.  Patient will be able to transfer from supine to sitting independently for improved bed mobility. Baseline:  Goal status: IN PROGRESS  4.  Patient will be able to ambulate with her assistive device with no significant gait deviations for improved mobility. Baseline:  Goal status: IN PROGRESS  5.  Patient will be able to walk for at least 20 minutes without being limited by her familiar symptoms. Baseline:  Goal status: IN PROGRESS  PLAN:  PT FREQUENCY: 2x/week  PT DURATION: 4 weeks  PLANNED INTERVENTIONS: Therapeutic exercises, Therapeutic activity, Neuromuscular re-education, Balance training, Gait training, Patient/Family education, Self Care, Joint mobilization, Stair training, Electrical stimulation, Spinal mobilization, Cryotherapy, Moist heat, Manual therapy, and Re-evaluation  PLAN FOR NEXT SESSION: NuStep, isometrics, lower trunk rotations, lower extremity strengthening, and modalities as needed  Granville Lewis, PT 03/13/2023, 12:37 PM

## 2023-03-17 ENCOUNTER — Ambulatory Visit: Payer: Medicare Other

## 2023-03-17 DIAGNOSIS — M5459 Other low back pain: Secondary | ICD-10-CM

## 2023-03-17 DIAGNOSIS — M79651 Pain in right thigh: Secondary | ICD-10-CM

## 2023-03-17 NOTE — Therapy (Signed)
OUTPATIENT PHYSICAL THERAPY LOWER EXTREMITY TREATMENT   Patient Name: Misty Smith MRN: [id number] DOB:1941-08-20, 82 y.o., female Today's Date: 03/17/2023  END OF SESSION:  PT End of Session - 03/17/23 0802     Visit Number 8    Number of Visits 8    Date for PT Re-Evaluation 05/01/23    PT Start Time 0800    PT Stop Time 0846    PT Time Calculation (min) 46 min    Activity Tolerance Patient tolerated treatment well    Behavior During Therapy [facility] for tasks assessed/performed              Past Medical History:  Diagnosis Date   Allergy    Anxiety    Arthritis    Bronchitis    Cataract    Colovaginal fistula    Depression    Diabetes mellitus without complication (HCC)    Dysrhythmia    palpitations, occasional PVCs; Dr Jens Som cardiologist   Esophageal stricture    Fractured elbow 1970's   GERD (gastroesophageal reflux disease)    H/O hiatal hernia    Hyperlipidemia    Hypertension    Osteopenia    Perimenopausal vasomotor symptoms    Postmenopausal HRT (hormone replacement therapy)    Pre-diabetes    Stress    Subacute lupus erythematosus    Dr. Terri Piedra   Past Surgical History:  Procedure Laterality Date   abdominal bypass  1987   fibroids    ABDOMINAL HYSTERECTOMY     partial   bilateral tubal ligtation  1972   CHOLECYSTECTOMY N/A 03/21/2014   Procedure: LAPAROSCOPIC CHOLECYSTECTOMY WITH INTRAOPERATIVE CHOLANGIOGRAM;  Surgeon: Kandis Cocking, MD;  Location: [facility] OR;  Service: General;  Laterality: N/A;   COLONOSCOPY     elbow Right 1976   surgery after fracture of elbow   EYE SURGERY Bilateral    cataracts   ROTATOR CUFF REPAIR Right 03/29/2018   TONSILLECTOMY     TUBAL LIGATION     Patient Active Problem List   Diagnosis Date Noted   Osteoarthritis of knee 09/11/2021   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   Upper airway cough syndrome 02/08/2021   Type 2 diabetes mellitus with other specified complication (HCC) 01/30/2021   Lumbar  radiculopathy 01/11/2021   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   Impingement syndrome of shoulder region 09/26/2017   Vitamin D deficiency 03/18/2016   CAD (coronary artery disease) 12/30/2013   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   Osteopenia of the elderly 10/26/2013   Generalized anxiety disorder 09/21/2013   GERD (gastroesophageal reflux disease) 09/21/2013   Multiple pulmonary nodules determined by computed tomography of lung 09/21/2013   Hyperlipidemia 02/01/2009   Essential hypertension 02/01/2009   REFERRING PROVIDER: Arrie Senate, FNP   REFERRING DIAG: Pain of right lower extremity; Pain of right hip   THERAPY DIAG:  Other low back pain  Pain in right thigh  Rationale for Evaluation and Treatment: Rehabilitation  ONSET DATE: late May 2024  SUBJECTIVE:   SUBJECTIVE STATEMENT: Patient reports that she feels good today. She feels comfortable graduating from therapy today.   PERTINENT HISTORY: Hypertension, diabetes, osteopenia, osteoarthritis, anxiety, and depression  PAIN:  Are you having pain? No  PRECAUTIONS: None  WEIGHT BEARING RESTRICTIONS: No  FALLS:  Has patient fallen in last 6 months? No  PATIENT GOALS: reduced pain and be able to walk longer  NEXT MD VISIT: 04/06/23  OBJECTIVE: all objective measures were assessed at her initial evaluation on 02/20/23 unless  otherwise noted  PATIENT SURVEYS:  FOTO 58.12 on 03/17/23  PALPATION: TTP: right lumbar paraspinals, QL, gluteals, piriformis, TFL, iliotibial band, and quadriceps  LUMBAR JOINT MOBILITY:  Hypomobile and "tender" throughout   LUMBAR ROM:   Active  A/PROM  eval  Flexion 20; familiar back and RLE pain   Extension 6; familiar back and RLE pain   Right lateral flexion   Left lateral flexion   Right rotation 50% limited; right low back pain  Left rotation 50% limited   (Blank rows = not tested)   LOWER EXTREMITY ROM: WFL for activities  assessed  LOWER EXTREMITY MMT:  MMT Right eval Left eval  Hip flexion 4-/5; "pulling" 4-/5  Hip extension    Hip abduction    Hip adduction    Hip internal rotation    Hip external rotation    Knee flexion 4/5; "pulling, but not as bad"  4/5  Knee extension 3+/5:/; "pulling" 4-/5  Ankle dorsiflexion 3+/5 3+/5  Ankle plantarflexion    Ankle inversion    Ankle eversion     (Blank rows = not tested)  FUNCTIONAL TESTS:  Sit to stand transfer: required UE support and painful  Bed mobility: required modA and painful   GAIT: Assistive device utilized: Quad cane small base Level of assistance: Modified independence Comments: left trendelenburg  TODAY'S TREATMENT:                                                                                                                              DATE:                                   03/17/23 EXERCISE LOG  Exercise Repetitions and Resistance Comments  Nustep L4 x 16 minutes    Blank cell = exercise not performed today                                    03/13/23 EXERCISE LOG  Exercise Repetitions and Resistance Comments  Nustep  L4 x 15 minutes   LAQ 4# x 3 minutes Alternating LE  Seated marching 4# x 2.5 minutes Alternating LE  Static stance on foam  2 minutes  Intermittent UE support  Marching on foam 20 reps each  2 HHA to 1 HHA   Standing heel raise  20 reps    Standing hip ABD  25 reps each    Standing gastroc stretch  2 minutes   Sit to stand  2 x 7 reps Without UE support   Blank cell = exercise not performed today                                    03/10/23 EXERCISE LOG  Exercise Repetitions  and Resistance Comments  Nustep  L4 x 17 minutes   LAQ 3# x 3 minutes Alternating LE  Seated marching  3# x 20 reps each  Alternating LE; low back pain  Slouch overcorrect 2 minutes   Seated hip ADD isometric  2.5 minutes w/ 5 second hold    Seated HS stretch  3 x 30 seconds each   Sit to stand  2 x 6 reps  From standard height  chair  Seated heel/toe raises  3 minutes    Blank cell = exercise not performed today   PATIENT EDUCATION:  Education details: progress with therapy, activity modification, benefits of exercise, and safety at home Person educated: Patient Education method: Explanation Education comprehension: verbalized understanding  HOME EXERCISE PROGRAM:  ASSESSMENT:  CLINICAL IMPRESSION: Patient has made good progress with skilled physical therapy as evidenced by her subjective reports, functional mobility, and progress toward her goals.  She was able to meet most of her goals for skilled physical therapy.  However, she continues to experience intermittent moderate low back pain at night.  She reported feeling comfortable being discharged at this time.  She was educated on the benefits of continuing exercise at home and how to reduce her risk of falling.  She reported understanding and felt comfortable with these activity modifications.  PHYSICAL THERAPY DISCHARGE SUMMARY  Visits from Start of Care: 8  Current functional level related to goals / functional outcomes: Patient was able to meet most of her goals for skilled physical therapy and felt comfortable being discharged at this time.    Remaining deficits: Intermittent pain   Education / Equipment: HEP and see education    Patient agrees to discharge. Patient goals were partially met. Patient is being discharged due to being pleased with the current functional level.   OBJECTIVE IMPAIRMENTS: Abnormal gait, decreased activity tolerance, decreased mobility, difficulty walking, decreased ROM, decreased strength, hypomobility, impaired tone, and pain.   ACTIVITY LIMITATIONS: carrying, lifting, bending, standing, stairs, transfers, bed mobility, and locomotion level  PARTICIPATION LIMITATIONS: meal prep, cleaning, laundry, shopping, community activity, and yard work  PERSONAL FACTORS: Age and 3+ comorbidities: Hypertension, diabetes,  osteopenia, osteoarthritis, anxiety, and depression  are also affecting patient's functional outcome.   REHAB POTENTIAL: Good  CLINICAL DECISION MAKING: Evolving/moderate complexity  EVALUATION COMPLEXITY: Moderate  GOALS: Goals reviewed with patient? Yes  LONG TERM GOALS: Target date: 03/20/23  Patient will be independent with her home exercise program. Baseline:  Goal status: MET  2.  Patient will be able to complete her daily activities without her familiar pain exceeding 3/10.  Baseline: pain can get up to 4-5/10 at night Goal status: NOT MET  3.  Patient will be able to transfer from supine to sitting independently for improved bed mobility. Baseline: independent with this transfer Goal status: MET  4.  Patient will be able to ambulate with her assistive device with no significant gait deviations for improved mobility. Baseline:  Goal status: MET  5.  Patient will be able to walk for at least 20 minutes without being limited by her familiar symptoms. Baseline: 1 hour prior to having to sit  Goal status: MET  PLAN:  PT FREQUENCY: 2x/week  PT DURATION: 4 weeks  PLANNED INTERVENTIONS: Therapeutic exercises, Therapeutic activity, Neuromuscular re-education, Balance training, Gait training, Patient/Family education, Self Care, Joint mobilization, Stair training, Electrical stimulation, Spinal mobilization, Cryotherapy, Moist heat, Manual therapy, and Re-evaluation  PLAN FOR NEXT SESSION: NuStep, isometrics, lower trunk rotations, lower extremity strengthening, and modalities  as needed  Granville Lewis, PT 03/17/2023, 10:43 AM

## 2023-03-20 DIAGNOSIS — L720 Epidermal cyst: Secondary | ICD-10-CM | POA: Diagnosis not present

## 2023-03-20 DIAGNOSIS — L538 Other specified erythematous conditions: Secondary | ICD-10-CM | POA: Diagnosis not present

## 2023-03-20 DIAGNOSIS — L821 Other seborrheic keratosis: Secondary | ICD-10-CM | POA: Diagnosis not present

## 2023-03-20 DIAGNOSIS — L820 Inflamed seborrheic keratosis: Secondary | ICD-10-CM | POA: Diagnosis not present

## 2023-03-20 DIAGNOSIS — H61032 Chondritis of left external ear: Secondary | ICD-10-CM | POA: Diagnosis not present

## 2023-03-20 DIAGNOSIS — L298 Other pruritus: Secondary | ICD-10-CM | POA: Diagnosis not present

## 2023-03-20 DIAGNOSIS — M170 Bilateral primary osteoarthritis of knee: Secondary | ICD-10-CM | POA: Diagnosis not present

## 2023-03-20 DIAGNOSIS — L281 Prurigo nodularis: Secondary | ICD-10-CM | POA: Diagnosis not present

## 2023-03-20 DIAGNOSIS — D485 Neoplasm of uncertain behavior of skin: Secondary | ICD-10-CM | POA: Diagnosis not present

## 2023-03-27 DIAGNOSIS — M170 Bilateral primary osteoarthritis of knee: Secondary | ICD-10-CM | POA: Diagnosis not present

## 2023-03-30 ENCOUNTER — Telehealth: Payer: Self-pay | Admitting: Family Medicine

## 2023-03-30 DIAGNOSIS — E1169 Type 2 diabetes mellitus with other specified complication: Secondary | ICD-10-CM

## 2023-03-30 DIAGNOSIS — E782 Mixed hyperlipidemia: Secondary | ICD-10-CM

## 2023-03-30 NOTE — Telephone Encounter (Signed)
Future lab orders placed

## 2023-04-01 DIAGNOSIS — M9903 Segmental and somatic dysfunction of lumbar region: Secondary | ICD-10-CM | POA: Diagnosis not present

## 2023-04-01 DIAGNOSIS — M9901 Segmental and somatic dysfunction of cervical region: Secondary | ICD-10-CM | POA: Diagnosis not present

## 2023-04-01 DIAGNOSIS — M62830 Muscle spasm of back: Secondary | ICD-10-CM | POA: Diagnosis not present

## 2023-04-01 DIAGNOSIS — M9904 Segmental and somatic dysfunction of sacral region: Secondary | ICD-10-CM | POA: Diagnosis not present

## 2023-04-02 ENCOUNTER — Other Ambulatory Visit: Payer: Medicare Other

## 2023-04-02 DIAGNOSIS — E1169 Type 2 diabetes mellitus with other specified complication: Secondary | ICD-10-CM | POA: Diagnosis not present

## 2023-04-02 DIAGNOSIS — E782 Mixed hyperlipidemia: Secondary | ICD-10-CM | POA: Diagnosis not present

## 2023-04-02 LAB — LIPID PANEL

## 2023-04-02 LAB — CMP14+EGFR

## 2023-04-02 LAB — CBC WITH DIFFERENTIAL/PLATELET
Basophils Absolute: 0 10*3/uL (ref 0.0–0.2)
Basos: 0 %
EOS (ABSOLUTE): 0.2 10*3/uL (ref 0.0–0.4)
Eos: 3 %
Hematocrit: 34.2 % (ref 34.0–46.6)
Hemoglobin: 11.4 g/dL (ref 11.1–15.9)
Immature Grans (Abs): 0 10*3/uL (ref 0.0–0.1)
Immature Granulocytes: 0 %
Lymphocytes Absolute: 1.6 10*3/uL (ref 0.7–3.1)
Lymphs: 26 %
MCH: 32.1 pg (ref 26.6–33.0)
MCHC: 33.3 g/dL (ref 31.5–35.7)
MCV: 96 fL (ref 79–97)
Monocytes Absolute: 0.7 10*3/uL (ref 0.1–0.9)
Monocytes: 11 %
Neutrophils Absolute: 3.6 10*3/uL (ref 1.4–7.0)
Neutrophils: 60 %
Platelets: 229 10*3/uL (ref 150–450)
RBC: 3.55 x10E6/uL — ABNORMAL LOW (ref 3.77–5.28)
RDW: 12.6 % (ref 11.7–15.4)
WBC: 6.1 10*3/uL (ref 3.4–10.8)

## 2023-04-02 LAB — BAYER DCA HB A1C WAIVED: HB A1C (BAYER DCA - WAIVED): 5.9 % — ABNORMAL HIGH (ref 4.8–5.6)

## 2023-04-06 ENCOUNTER — Encounter: Payer: Self-pay | Admitting: Family Medicine

## 2023-04-06 ENCOUNTER — Ambulatory Visit: Admission: RE | Admit: 2023-04-06 | Payer: 59 | Source: Ambulatory Visit

## 2023-04-06 ENCOUNTER — Ambulatory Visit (INDEPENDENT_AMBULATORY_CARE_PROVIDER_SITE_OTHER): Payer: Medicare Other | Admitting: Family Medicine

## 2023-04-06 VITALS — BP 122/68 | HR 54 | Ht 60.0 in | Wt 164.0 lb

## 2023-04-06 DIAGNOSIS — F411 Generalized anxiety disorder: Secondary | ICD-10-CM | POA: Diagnosis not present

## 2023-04-06 DIAGNOSIS — I10 Essential (primary) hypertension: Secondary | ICD-10-CM | POA: Diagnosis not present

## 2023-04-06 DIAGNOSIS — E1169 Type 2 diabetes mellitus with other specified complication: Secondary | ICD-10-CM

## 2023-04-06 DIAGNOSIS — I2510 Atherosclerotic heart disease of native coronary artery without angina pectoris: Secondary | ICD-10-CM | POA: Diagnosis not present

## 2023-04-06 DIAGNOSIS — E7800 Pure hypercholesterolemia, unspecified: Secondary | ICD-10-CM

## 2023-04-06 DIAGNOSIS — Z7984 Long term (current) use of oral hypoglycemic drugs: Secondary | ICD-10-CM | POA: Diagnosis not present

## 2023-04-06 MED ORDER — PIOGLITAZONE HCL 30 MG PO TABS: 30.0000 mg | ORAL_TABLET | Freq: Every day | ORAL | 3 refills | Status: DC

## 2023-04-06 MED ORDER — ESCITALOPRAM OXALATE 20 MG PO TABS: 20.0000 mg | ORAL_TABLET | Freq: Every day | ORAL | 3 refills | Status: DC

## 2023-04-06 MED ORDER — HYDROCHLOROTHIAZIDE 12.5 MG PO TABS: 12.5000 mg | ORAL_TABLET | Freq: Every day | ORAL | 3 refills | Status: DC

## 2023-04-06 MED ORDER — ESOMEPRAZOLE MAGNESIUM 40 MG PO CPDR: 40.0000 mg | DELAYED_RELEASE_CAPSULE | Freq: Every day | ORAL | 3 refills | Status: DC

## 2023-04-06 NOTE — Progress Notes (Signed)
BP 122/68   Pulse (!) 54   Ht 5' (1.524 m)   Wt 164 lb (74.4 kg)   SpO2 99%   BMI 32.03 kg/m    Subjective:   Patient ID: Misty Smith, female    DOB: 1941-06-10, 82 y.o.   MRN: [id number]  HPI: Misty Smith is a 82 y.o. female presenting on 04/06/2023 for No chief complaint on file.   HPI Type 2 diabetes mellitus Patient comes in today for recheck of his diabetes. Patient has been currently taking Actos. Patient is currently on an ACE inhibitor/ARB. Patient has not seen an ophthalmologist this year. Patient denies any new issues with their feet. The symptom started onset as an adult CAD and hypertension and hyperlipidemia ARE RELATED TO DM   Hypertension Patient is currently on amlodipine and metoprolol and they restart hydrochlorothiazide, and their blood pressure today is 122/68. Patient denies any lightheadedness or dizziness. Patient denies headaches, blurred vision, chest pains, shortness of breath, or weakness. Denies any side effects from medication and is content with current medication.   Hyperlipidemia Patient is coming in for recheck of his hyperlipidemia. The patient is currently taking fish oils and atorvastatin. They deny any issues with myalgias or history of liver damage from it. They deny any focal numbness or weakness or chest pain.   Anxiety recheck Patient is coming in today for anxiety recheck.  She currently takes Lexapro.  She feels like she is doing well.  No major issues.    04/06/2023    8:22 AM 02/11/2023    2:12 PM 02/03/2023    2:50 PM 12/05/2022    9:35 AM 04/04/2022    8:40 AM  Depression screen PHQ 2/9  Decreased Interest 0 0 0 0 0  Down, Depressed, Hopeless 0 0 0 0 0  PHQ - 2 Score 0 0 0 0 0  Altered sleeping 0 0   0  Tired, decreased energy 0 0   0  Change in appetite 0 0   0  Feeling bad or failure about yourself  0 0   0  Trouble concentrating 0 0   0  Moving slowly or fidgety/restless 0 0   0  Suicidal thoughts 0 0   0  PHQ-9 Score 0 0    0  Difficult doing work/chores Not difficult at all Not difficult at all   Not difficult at all     Relevant past medical, surgical, family and social history reviewed and updated as indicated. Interim medical history since our last visit reviewed. Allergies and medications reviewed and updated.  Review of Systems  Constitutional:  Negative for chills and fever.  HENT:  Negative for congestion, ear discharge and ear pain.   Eyes:  Negative for redness and visual disturbance.  Respiratory:  Negative for chest tightness and shortness of breath.   Cardiovascular:  Negative for chest pain and leg swelling.  Genitourinary:  Negative for difficulty urinating and dysuria.  Musculoskeletal:  Negative for back pain and gait problem.  Skin:  Negative for rash.  Neurological:  Negative for dizziness, light-headedness and headaches.  Psychiatric/Behavioral:  Negative for agitation and behavioral problems.   All other systems reviewed and are negative.   Per HPI unless specifically indicated above   Allergies as of 04/06/2023       Reactions   Ciprofloxacin Rash   Codeine Rash   Iohexol     Desc: PT HAD HIVES AND WAS GIVEN BENEDRYL PO BY NURSE AND  OBSERVED IN NURSES STATION AND RELEASED WITHOUT AND OTHER COMPLICATIONS SHEET SCANNED UNDER NURSES NOTES///ON 12/01/11 PT HAD ANOTHER CT WITH PREMEDS MINUS THE BENADRYL. SHE HAD A DELAYED REACTION AFTER WITH THROAT TIGHTNESS AND SOB, JB  12/07/12   Penicillins Hives   Sulfonamide Derivatives Hives   Asa [aspirin] Other (See Comments)   Large amounts causes stomach pain   Keflex [cephalexin] Nausea And Vomiting   Morphine And Codeine Rash        Medication List        Accurate as of April 06, 2023  8:46 AM. If you have any questions, ask your nurse or doctor.          amLODipine 10 MG tablet Commonly known as: NORVASC TAKE 1 TABLET BY MOUTH EVERY DAY   aspirin EC 81 MG tablet Take 1 tablet (81 mg total) by mouth daily.   atorvastatin  40 MG tablet Commonly known as: LIPITOR TAKE 1 TABLET BY MOUTH EVERYDAY AT BEDTIME   betamethasone dipropionate 0.05 % cream Apply topically.   Calcium Carb-Cholecalciferol 600-800 MG-UNIT Tabs Take 1 tablet by mouth every evening.   cholecalciferol 1000 units tablet Commonly known as: VITAMIN D Take 2,000 Units by mouth daily.   Clinpro 5000 1.1 % Pste Generic drug: Sodium Fluoride Place onto teeth 2 (two) times daily.   escitalopram 20 MG tablet Commonly known as: LEXAPRO Take 1 tablet (20 mg total) by mouth daily.   esomeprazole 40 MG capsule Commonly known as: NEXIUM Take 1 capsule (40 mg total) by mouth daily.   estradiol 0.1 MG/GM vaginal cream Commonly known as: ESTRACE Place vaginally.   hydrochlorothiazide 12.5 MG tablet Commonly known as: HYDRODIURIL Take 1 tablet (12.5 mg total) by mouth daily.   hydrocortisone 2.5 % cream PLEASE SEE ATTACHED FOR DETAILED DIRECTIONS   irbesartan 75 MG tablet Commonly known as: AVAPRO TAKE 1 TABLET BY MOUTH EVERY DAY   ketoconazole 2 % cream Commonly known as: NIZORAL Apply topically 2 (two) times daily as needed.   metoprolol tartrate 25 MG tablet Commonly known as: LOPRESSOR Take 12.5 mg by mouth 2 (two) times daily.   multivitamin tablet Take 1 tablet by mouth every morning.   omega-3 acid ethyl esters 1 g capsule Commonly known as: LOVAZA TAKE 2 CAPSULES BY MOUTH 2 TIMES DAILY.   pioglitazone 30 MG tablet Commonly known as: ACTOS Take 1 tablet (30 mg total) by mouth daily.         Objective:   BP 122/68   Pulse (!) 54   Ht 5' (1.524 m)   Wt 164 lb (74.4 kg)   SpO2 99%   BMI 32.03 kg/m   Wt Readings from Last 3 Encounters:  04/06/23 164 lb (74.4 kg)  02/11/23 165 lb (74.8 kg)  02/03/23 160 lb (72.6 kg)    Physical Exam Vitals and nursing note reviewed.  Constitutional:      General: She is not in acute distress.    Appearance: She is well-developed. She is not diaphoretic.  Eyes:      Conjunctiva/sclera: Conjunctivae normal.  Cardiovascular:     Rate and Rhythm: Normal rate and regular rhythm.     Heart sounds: Normal heart sounds. No murmur heard. Pulmonary:     Effort: Pulmonary effort is normal. No respiratory distress.     Breath sounds: Normal breath sounds. No wheezing.  Musculoskeletal:        General: No swelling. Normal range of motion.  Skin:    General: Skin is  warm and dry.     Findings: No rash.  Neurological:     Mental Status: She is alert and oriented to person, place, and time.     Coordination: Coordination normal.  Psychiatric:        Behavior: Behavior normal.     Results for orders placed or performed in visit on 04/02/23  Bayer DCA Hb A1c Waived  Result Value Ref Range   HB A1C (BAYER DCA - WAIVED) 5.9 (H) 4.8 - 5.6 %  Lipid panel  Result Value Ref Range   Cholesterol, Total 143 100 - 199 mg/dL   Triglycerides 956 (H) 0 - 149 mg/dL   HDL 47 >38 mg/dL   VLDL Cholesterol Cal 33 5 - 40 mg/dL   LDL Chol Calc (NIH) 63 0 - 99 mg/dL   Chol/HDL Ratio 3.0 0.0 - 4.4 ratio  CBC with Differential/Platelet  Result Value Ref Range   WBC 6.1 3.4 - 10.8 x10E3/uL   RBC 3.55 (L) 3.77 - 5.28 x10E6/uL   Hemoglobin 11.4 11.1 - 15.9 g/dL   Hematocrit 75.6 43.3 - 46.6 %   MCV 96 79 - 97 fL   MCH 32.1 26.6 - 33.0 pg   MCHC 33.3 31.5 - 35.7 g/dL   RDW 29.5 18.8 - 41.6 %   Platelets 229 150 - 450 x10E3/uL   Neutrophils 60 Not Estab. %   Lymphs 26 Not Estab. %   Monocytes 11 Not Estab. %   Eos 3 Not Estab. %   Basos 0 Not Estab. %   Neutrophils Absolute 3.6 1.4 - 7.0 x10E3/uL   Lymphocytes Absolute 1.6 0.7 - 3.1 x10E3/uL   Monocytes Absolute 0.7 0.1 - 0.9 x10E3/uL   EOS (ABSOLUTE) 0.2 0.0 - 0.4 x10E3/uL   Basophils Absolute 0.0 0.0 - 0.2 x10E3/uL   Immature Granulocytes 0 Not Estab. %   Immature Grans (Abs) 0.0 0.0 - 0.1 x10E3/uL  CMP14+EGFR  Result Value Ref Range   Glucose 116 (H) 70 - 99 mg/dL   BUN 19 8 - 27 mg/dL   Creatinine, Ser 6.06  0.57 - 1.00 mg/dL   eGFR 82 >30 ZS/WFU/9.32   BUN/Creatinine Ratio 26 12 - 28   Sodium 138 134 - 144 mmol/L   Potassium 4.8 3.5 - 5.2 mmol/L   Chloride 99 96 - 106 mmol/L   CO2 18 (L) 20 - 29 mmol/L   Calcium 9.7 8.7 - 10.3 mg/dL   Total Protein 6.6 6.0 - 8.5 g/dL   Albumin 3.8 3.7 - 4.7 g/dL   Globulin, Total 2.8 1.5 - 4.5 g/dL   Bilirubin Total 0.3 0.0 - 1.2 mg/dL   Alkaline Phosphatase 77 44 - 121 IU/L   AST 34 0 - 40 IU/L   ALT 17 0 - 32 IU/L    Assessment & Plan:   Problem List Items Addressed This Visit       Cardiovascular and Mediastinum   Essential hypertension   Relevant Medications   metoprolol tartrate (LOPRESSOR) 25 MG tablet   hydrochlorothiazide (HYDRODIURIL) 12.5 MG tablet   CAD (coronary artery disease)   Relevant Medications   metoprolol tartrate (LOPRESSOR) 25 MG tablet   hydrochlorothiazide (HYDRODIURIL) 12.5 MG tablet     Endocrine   Type 2 diabetes mellitus with other specified complication (HCC)   Relevant Medications   pioglitazone (ACTOS) 30 MG tablet     Other   Hyperlipidemia - Primary   Relevant Medications   metoprolol tartrate (LOPRESSOR) 25 MG tablet   hydrochlorothiazide (  HYDRODIURIL) 12.5 MG tablet   Generalized anxiety disorder   Relevant Medications   escitalopram (LEXAPRO) 20 MG tablet  Triglycerides are slightly elevated, focus on diet and also recommended to try increasing the fish oils if possible.  Follow up plan: Return in about 4 months (around 08/06/2023), or if symptoms worsen or fail to improve, for Diabetes hypertension cholesterol.  Counseling provided for all of the vaccine components No orders of the defined types were placed in this encounter.   Arville Care, MD [facility] 04/06/2023, 8:46 AM

## 2023-04-08 DIAGNOSIS — M9903 Segmental and somatic dysfunction of lumbar region: Secondary | ICD-10-CM | POA: Diagnosis not present

## 2023-04-08 DIAGNOSIS — M9901 Segmental and somatic dysfunction of cervical region: Secondary | ICD-10-CM | POA: Diagnosis not present

## 2023-04-08 DIAGNOSIS — M62830 Muscle spasm of back: Secondary | ICD-10-CM | POA: Diagnosis not present

## 2023-04-08 DIAGNOSIS — M9904 Segmental and somatic dysfunction of sacral region: Secondary | ICD-10-CM | POA: Diagnosis not present

## 2023-04-15 DIAGNOSIS — M9903 Segmental and somatic dysfunction of lumbar region: Secondary | ICD-10-CM | POA: Diagnosis not present

## 2023-04-15 DIAGNOSIS — M9901 Segmental and somatic dysfunction of cervical region: Secondary | ICD-10-CM | POA: Diagnosis not present

## 2023-04-15 DIAGNOSIS — M9904 Segmental and somatic dysfunction of sacral region: Secondary | ICD-10-CM | POA: Diagnosis not present

## 2023-04-15 DIAGNOSIS — M62830 Muscle spasm of back: Secondary | ICD-10-CM | POA: Diagnosis not present

## 2023-04-21 DIAGNOSIS — M79676 Pain in unspecified toe(s): Secondary | ICD-10-CM | POA: Diagnosis not present

## 2023-04-21 DIAGNOSIS — L84 Corns and callosities: Secondary | ICD-10-CM | POA: Diagnosis not present

## 2023-04-21 DIAGNOSIS — E1142 Type 2 diabetes mellitus with diabetic polyneuropathy: Secondary | ICD-10-CM | POA: Diagnosis not present

## 2023-04-21 DIAGNOSIS — B351 Tinea unguium: Secondary | ICD-10-CM | POA: Diagnosis not present

## 2023-04-22 DIAGNOSIS — M9904 Segmental and somatic dysfunction of sacral region: Secondary | ICD-10-CM | POA: Diagnosis not present

## 2023-04-22 DIAGNOSIS — M9903 Segmental and somatic dysfunction of lumbar region: Secondary | ICD-10-CM | POA: Diagnosis not present

## 2023-04-22 DIAGNOSIS — M9901 Segmental and somatic dysfunction of cervical region: Secondary | ICD-10-CM | POA: Diagnosis not present

## 2023-04-22 DIAGNOSIS — M62830 Muscle spasm of back: Secondary | ICD-10-CM | POA: Diagnosis not present

## 2023-04-29 ENCOUNTER — Ambulatory Visit
Admission: RE | Admit: 2023-04-29 | Discharge: 2023-04-29 | Disposition: A | Discharge status: Home / Self Care | Payer: Medicare Other | Source: Ambulatory Visit | Attending: Family Medicine

## 2023-04-29 DIAGNOSIS — M9903 Segmental and somatic dysfunction of lumbar region: Secondary | ICD-10-CM | POA: Diagnosis not present

## 2023-04-29 DIAGNOSIS — Z1231 Encounter for screening mammogram for malignant neoplasm of breast: Secondary | ICD-10-CM

## 2023-04-29 DIAGNOSIS — M9901 Segmental and somatic dysfunction of cervical region: Secondary | ICD-10-CM | POA: Diagnosis not present

## 2023-04-29 DIAGNOSIS — M62830 Muscle spasm of back: Secondary | ICD-10-CM | POA: Diagnosis not present

## 2023-04-29 DIAGNOSIS — M9904 Segmental and somatic dysfunction of sacral region: Secondary | ICD-10-CM | POA: Diagnosis not present

## 2023-05-06 DIAGNOSIS — M9903 Segmental and somatic dysfunction of lumbar region: Secondary | ICD-10-CM | POA: Diagnosis not present

## 2023-05-06 DIAGNOSIS — M9901 Segmental and somatic dysfunction of cervical region: Secondary | ICD-10-CM | POA: Diagnosis not present

## 2023-05-06 DIAGNOSIS — M9904 Segmental and somatic dysfunction of sacral region: Secondary | ICD-10-CM | POA: Diagnosis not present

## 2023-05-06 DIAGNOSIS — M62830 Muscle spasm of back: Secondary | ICD-10-CM | POA: Diagnosis not present

## 2023-05-20 ENCOUNTER — Other Ambulatory Visit: Payer: Self-pay | Admitting: Family Medicine

## 2023-05-20 DIAGNOSIS — M9904 Segmental and somatic dysfunction of sacral region: Secondary | ICD-10-CM | POA: Diagnosis not present

## 2023-05-20 DIAGNOSIS — M62830 Muscle spasm of back: Secondary | ICD-10-CM | POA: Diagnosis not present

## 2023-05-20 DIAGNOSIS — M9901 Segmental and somatic dysfunction of cervical region: Secondary | ICD-10-CM | POA: Diagnosis not present

## 2023-05-20 DIAGNOSIS — M9903 Segmental and somatic dysfunction of lumbar region: Secondary | ICD-10-CM | POA: Diagnosis not present

## 2023-05-21 ENCOUNTER — Other Ambulatory Visit: Payer: Self-pay | Admitting: Family Medicine

## 2023-05-21 DIAGNOSIS — I10 Essential (primary) hypertension: Secondary | ICD-10-CM

## 2023-06-03 DIAGNOSIS — M9904 Segmental and somatic dysfunction of sacral region: Secondary | ICD-10-CM | POA: Diagnosis not present

## 2023-06-03 DIAGNOSIS — M9901 Segmental and somatic dysfunction of cervical region: Secondary | ICD-10-CM | POA: Diagnosis not present

## 2023-06-03 DIAGNOSIS — M62830 Muscle spasm of back: Secondary | ICD-10-CM | POA: Diagnosis not present

## 2023-06-03 DIAGNOSIS — M9903 Segmental and somatic dysfunction of lumbar region: Secondary | ICD-10-CM | POA: Diagnosis not present

## 2023-06-11 ENCOUNTER — Other Ambulatory Visit: Payer: Self-pay | Admitting: Family Medicine

## 2023-06-11 DIAGNOSIS — H35352 Cystoid macular degeneration, left eye: Secondary | ICD-10-CM | POA: Diagnosis not present

## 2023-06-11 DIAGNOSIS — H35072 Retinal telangiectasis, left eye: Secondary | ICD-10-CM | POA: Diagnosis not present

## 2023-06-11 DIAGNOSIS — H353123 Nonexudative age-related macular degeneration, left eye, advanced atrophic without subfoveal involvement: Secondary | ICD-10-CM | POA: Diagnosis not present

## 2023-06-11 DIAGNOSIS — H353111 Nonexudative age-related macular degeneration, right eye, early dry stage: Secondary | ICD-10-CM | POA: Diagnosis not present

## 2023-06-12 ENCOUNTER — Ambulatory Visit: Payer: Medicare Other | Admitting: Family Medicine

## 2023-06-12 ENCOUNTER — Encounter: Payer: Self-pay | Admitting: Family Medicine

## 2023-06-12 VITALS — BP 143/58 | HR 62 | Temp 97.6°F | Ht 60.0 in | Wt 167.0 lb

## 2023-06-12 DIAGNOSIS — R300 Dysuria: Secondary | ICD-10-CM

## 2023-06-12 DIAGNOSIS — B379 Candidiasis, unspecified: Secondary | ICD-10-CM | POA: Diagnosis not present

## 2023-06-12 DIAGNOSIS — N3001 Acute cystitis with hematuria: Secondary | ICD-10-CM

## 2023-06-12 LAB — URINALYSIS, ROUTINE W REFLEX MICROSCOPIC
Bilirubin, UA: NEGATIVE
Glucose, UA: NEGATIVE
Nitrite, UA: NEGATIVE
Specific Gravity, UA: 1.025 (ref 1.005–1.030)
Urobilinogen, Ur: 0.2 mg/dL (ref 0.2–1.0)
pH, UA: 5.5 (ref 5.0–7.5)

## 2023-06-12 LAB — MICROSCOPIC EXAMINATION
RBC, Urine: >30 /[HPF] — AB (ref 0–2)
Renal Epithel, UA: NONE SEEN /[HPF]
WBC, UA: >30 /[HPF] — AB (ref 0–5)

## 2023-06-12 MED ORDER — CEPHALEXIN 500 MG PO CAPS
500.0000 mg | ORAL_CAPSULE | Freq: Two times a day (BID) | ORAL | 0 refills | Status: DC
Start: 2023-06-12 — End: 2023-06-17

## 2023-06-12 MED ORDER — FLUCONAZOLE 150 MG PO TABS
150.0000 mg | ORAL_TABLET | Freq: Once | ORAL | 0 refills | Status: AC
Start: 2023-06-12 — End: 2023-06-12

## 2023-06-12 NOTE — Progress Notes (Signed)
Acute Office Visit  Subjective:     Patient ID: Misty Smith, female    DOB: 1941-07-01, 82 y.o.   MRN: [id number]  Chief Complaint  Patient presents with   Urinary Frequency    Started Tuesday and got better then came back     Pt presents today with complaints of urgency, frequency, and dysuria.   Urinary Frequency  This is a new problem. The current episode started in the past 7 days. The problem occurs every urination. The problem has been waxing and waning. The quality of the pain is described as burning. There has been no fever. She is Not sexually active. There is No history of pyelonephritis. Associated symptoms include frequency and urgency. Pertinent negatives include no chills, discharge, flank pain, hematuria, hesitancy, nausea, possible pregnancy, sweats or vomiting. She has tried increased fluids for the symptoms. The treatment provided no relief.     Review of Systems  Constitutional:  Negative for chills, diaphoresis, fever, malaise/fatigue and weight loss.  Gastrointestinal:  Negative for abdominal pain, blood in stool, constipation, diarrhea, heartburn, melena, nausea and vomiting.  Genitourinary:  Positive for dysuria, frequency and urgency. Negative for flank pain, hematuria and hesitancy.  Neurological:  Negative for weakness.  All other systems reviewed and are negative.       Objective:    BP (!) 143/58   Pulse 62   Temp 97.6 F (36.4 C) (Temporal)   Ht 5' (1.524 m)   Wt 167 lb (75.8 kg)   SpO2 96%   BMI 32.61 kg/m    Physical Exam Vitals and nursing note reviewed.  Constitutional:      General: She is not in acute distress.    Appearance: She is obese. She is not ill-appearing, toxic-appearing or diaphoretic.  HENT:     Head: Normocephalic and atraumatic.     Nose: Nose normal.     Mouth/Throat:     Mouth: Mucous membranes are moist.     Pharynx: Oropharynx is clear.  Eyes:     Conjunctiva/sclera: Conjunctivae normal.     Pupils:  Pupils are equal, round, and reactive to light.  Cardiovascular:     Rate and Rhythm: Normal rate and regular rhythm.     Heart sounds: Normal heart sounds.  Pulmonary:     Effort: Pulmonary effort is normal.     Breath sounds: Normal breath sounds.  Abdominal:     General: Bowel sounds are normal.     Palpations: Abdomen is soft.     Tenderness: There is no abdominal tenderness. There is no right CVA tenderness or left CVA tenderness.  Skin:    General: Skin is warm and dry.     Capillary Refill: Capillary refill takes less than 2 seconds.  Neurological:     General: No focal deficit present.     Mental Status: She is alert and oriented to person, place, and time.     Gait: Gait abnormal (using cane).  Psychiatric:        Mood and Affect: Mood normal.        Behavior: Behavior normal.        Thought Content: Thought content normal.        Judgment: Judgment normal.    Urinalysis in office: 3+ leukocytes, 2+ blood, 2+ protein, many bacteria, and yeast present.      Assessment & Plan:   Problem List Items Addressed This Visit   None Visit Diagnoses     Dysuria    -  Primary   Relevant Orders   Urinalysis, Routine w reflex microscopic   Urine Culture   Acute cystitis with hematuria       Relevant Medications   cephALEXin (KEFLEX) 500 MG capsule   Yeast detected       Relevant Medications   fluconazole (DIFLUCAN) 150 MG tablet       Meds ordered this encounter  Medications   fluconazole (DIFLUCAN) 150 MG tablet    Sig: Take 1 tablet (150 mg total) by mouth once for 1 dose.    Dispense:  1 tablet    Refill:  0    Order Specific Question:   Supervising Provider    Answer:   Standley Brooking   cephALEXin (KEFLEX) 500 MG capsule    Sig: Take 1 capsule (500 mg total) by mouth 2 (two) times daily for 7 days.    Dispense:  14 capsule    Refill:  0    Order Specific Question:   Supervising Provider    Answer:   Mechele Claude [[id number]]    Return if symptoms  worsen or fail to improve.  The above assessment and management plan was discussed with the patient. The patient verbalized understanding of and has agreed to the management plan. Patient is aware to call the clinic if they develop any new symptoms or if symptoms fail to improve or worsen. Patient is aware when to return to the clinic for a follow-up visit. Patient educated on when it is appropriate to go to the emergency department.   Kari Baars, FNP-C [facility] [address] [place], [place] [address] [phone]

## 2023-06-16 LAB — URINE CULTURE

## 2023-06-17 ENCOUNTER — Ambulatory Visit: Payer: Medicare Other | Admitting: Family Medicine

## 2023-06-17 ENCOUNTER — Encounter: Payer: Self-pay | Admitting: Family Medicine

## 2023-06-17 VITALS — BP 133/56 | HR 64 | Ht 60.0 in | Wt 167.0 lb

## 2023-06-17 DIAGNOSIS — H9193 Unspecified hearing loss, bilateral: Secondary | ICD-10-CM | POA: Diagnosis not present

## 2023-06-17 NOTE — Progress Notes (Signed)
BP (!) 133/56   Pulse 64   Ht 5' (1.524 m)   Wt 167 lb (75.8 kg)   SpO2 98%   BMI 32.61 kg/m    Subjective:   Patient ID: Misty Smith, female    DOB: 18-Dec-1940, 82 y.o.   MRN: [id number]  HPI: Misty Smith is a 82 y.o. female presenting on 06/17/2023 for Hearing Loss (Left ear. ? Ent referral, hearing aids)   HPI Hearing loss Patient has been gradual hearing loss specially in her left ear more than the right ear where she has had a lot of history of tubes.  She says she has been noticing this for quite some time but over the past 6 months her family really has complained that she cannot hear as well and she wants to go look into getting some hearing aids.  She denies any pain or discharge.  Relevant past medical, surgical, family and social history reviewed and updated as indicated. Interim medical history since our last visit reviewed. Allergies and medications reviewed and updated.  Review of Systems  Constitutional:  Negative for chills and fever.  HENT:  Positive for hearing loss. Negative for congestion, ear discharge and ear pain.   Eyes:  Negative for redness and visual disturbance.  Respiratory:  Negative for chest tightness and shortness of breath.   Cardiovascular:  Negative for chest pain and leg swelling.  Genitourinary:  Negative for difficulty urinating and dysuria.  Musculoskeletal:  Negative for back pain and gait problem.  Skin:  Negative for rash.  Neurological:  Negative for light-headedness and headaches.  Psychiatric/Behavioral:  Negative for agitation and behavioral problems.   All other systems reviewed and are negative.   Per HPI unless specifically indicated above   Allergies as of 06/17/2023       Reactions   Ciprofloxacin Rash   Codeine Rash   Iohexol     Desc: PT HAD HIVES AND WAS GIVEN BENEDRYL PO BY NURSE AND OBSERVED IN NURSES STATION AND RELEASED WITHOUT AND OTHER COMPLICATIONS SHEET SCANNED UNDER NURSES NOTES///ON 12/01/11 PT HAD  ANOTHER CT WITH PREMEDS MINUS THE BENADRYL. SHE HAD A DELAYED REACTION AFTER WITH THROAT TIGHTNESS AND SOB, JB  12/07/12   Penicillins Hives   Sulfonamide Derivatives Hives   Asa [aspirin] Other (See Comments)   Large amounts causes stomach pain   Keflex [cephalexin] Nausea And Vomiting   Morphine And Codeine Rash        Medication List        Accurate as of June 17, 2023  4:33 PM. If you have any questions, ask your nurse or doctor.          STOP taking these medications    cephALEXin 500 MG capsule Commonly known as: Keflex Stopped by: Elige Radon Lorenza Shakir       TAKE these medications    amLODipine 10 MG tablet Commonly known as: NORVASC TAKE 1 TABLET BY MOUTH EVERY DAY   aspirin EC 81 MG tablet Take 1 tablet (81 mg total) by mouth daily.   atorvastatin 40 MG tablet Commonly known as: LIPITOR TAKE 1 TABLET BY MOUTH EVERYDAY AT BEDTIME   betamethasone dipropionate 0.05 % cream Apply topically.   Calcium Carb-Cholecalciferol 600-800 MG-UNIT Tabs Take 1 tablet by mouth every evening.   cholecalciferol 1000 units tablet Commonly known as: VITAMIN D Take 2,000 Units by mouth daily.   Clinpro 5000 1.1 % Pste Generic drug: Sodium Fluoride Place onto teeth 2 (two) times daily.  escitalopram 20 MG tablet Commonly known as: LEXAPRO Take 1 tablet (20 mg total) by mouth daily.   esomeprazole 40 MG capsule Commonly known as: NEXIUM Take 1 capsule (40 mg total) by mouth daily.   estradiol 0.1 MG/GM vaginal cream Commonly known as: ESTRACE Place vaginally.   hydrochlorothiazide 12.5 MG tablet Commonly known as: HYDRODIURIL TAKE 1 TABLET BY MOUTH EVERY DAY   hydrocortisone 2.5 % cream PLEASE SEE ATTACHED FOR DETAILED DIRECTIONS   irbesartan 75 MG tablet Commonly known as: AVAPRO TAKE 1 TABLET BY MOUTH EVERY DAY   ketoconazole 2 % cream Commonly known as: NIZORAL Apply topically 2 (two) times daily as needed.   metoprolol tartrate 25 MG  tablet Commonly known as: LOPRESSOR TAKE 0.5 TABLETS BY MOUTH 2 TIMES DAILY.   multivitamin tablet Take 1 tablet by mouth every morning.   omega-3 acid ethyl esters 1 g capsule Commonly known as: LOVAZA TAKE 2 CAPSULES BY MOUTH 2 TIMES DAILY.   pioglitazone 30 MG tablet Commonly known as: ACTOS TAKE 1 TABLET BY MOUTH EVERY DAY         Objective:   BP (!) 133/56   Pulse 64   Ht 5' (1.524 m)   Wt 167 lb (75.8 kg)   SpO2 98%   BMI 32.61 kg/m   Wt Readings from Last 3 Encounters:  06/17/23 167 lb (75.8 kg)  06/12/23 167 lb (75.8 kg)  04/06/23 164 lb (74.4 kg)    Physical Exam Vitals and nursing note reviewed.  Constitutional:      General: She is not in acute distress.    Appearance: She is well-developed. She is not diaphoretic.  HENT:     Right Ear: Tympanic membrane and ear canal normal.     Left Ear: Tympanic membrane and ear canal normal.     Nose: No congestion.     Mouth/Throat:     Mouth: Mucous membranes are moist.     Pharynx: Oropharynx is clear. No oropharyngeal exudate.  Eyes:     Conjunctiva/sclera: Conjunctivae normal.  Neurological:     Mental Status: She is alert and oriented to person, place, and time.     Coordination: Coordination normal.  Psychiatric:        Behavior: Behavior normal.       Assessment & Plan:   Problem List Items Addressed This Visit   None Visit Diagnoses     Bilateral hearing loss, unspecified hearing loss type    -  Primary   Relevant Orders   Ambulatory referral to ENT       Will refer patient to ENT for further evaluation, she has a history of tubes and scarring but her hearing loss has been gradually worsening and they want to look into hearing aids. Follow up plan: Return if symptoms worsen or fail to improve.  Counseling provided for all of the vaccine components Orders Placed This Encounter  Procedures   Ambulatory referral to ENT    Arville Care, MD [facility] 06/17/2023, 4:33 PM

## 2023-06-22 DIAGNOSIS — M62830 Muscle spasm of back: Secondary | ICD-10-CM | POA: Diagnosis not present

## 2023-06-22 DIAGNOSIS — M9901 Segmental and somatic dysfunction of cervical region: Secondary | ICD-10-CM | POA: Diagnosis not present

## 2023-06-22 DIAGNOSIS — M9903 Segmental and somatic dysfunction of lumbar region: Secondary | ICD-10-CM | POA: Diagnosis not present

## 2023-06-22 DIAGNOSIS — M9904 Segmental and somatic dysfunction of sacral region: Secondary | ICD-10-CM | POA: Diagnosis not present

## 2023-06-23 ENCOUNTER — Other Ambulatory Visit: Payer: Self-pay | Admitting: Family Medicine

## 2023-06-29 DIAGNOSIS — M62830 Muscle spasm of back: Secondary | ICD-10-CM | POA: Diagnosis not present

## 2023-06-29 DIAGNOSIS — M9901 Segmental and somatic dysfunction of cervical region: Secondary | ICD-10-CM | POA: Diagnosis not present

## 2023-06-29 DIAGNOSIS — M9904 Segmental and somatic dysfunction of sacral region: Secondary | ICD-10-CM | POA: Diagnosis not present

## 2023-06-29 DIAGNOSIS — M9903 Segmental and somatic dysfunction of lumbar region: Secondary | ICD-10-CM | POA: Diagnosis not present

## 2023-06-30 ENCOUNTER — Other Ambulatory Visit: Payer: Self-pay | Admitting: Family Medicine

## 2023-06-30 DIAGNOSIS — L84 Corns and callosities: Secondary | ICD-10-CM | POA: Diagnosis not present

## 2023-06-30 DIAGNOSIS — M79676 Pain in unspecified toe(s): Secondary | ICD-10-CM | POA: Diagnosis not present

## 2023-06-30 DIAGNOSIS — B351 Tinea unguium: Secondary | ICD-10-CM | POA: Diagnosis not present

## 2023-06-30 DIAGNOSIS — E1142 Type 2 diabetes mellitus with diabetic polyneuropathy: Secondary | ICD-10-CM | POA: Diagnosis not present

## 2023-07-03 DIAGNOSIS — M170 Bilateral primary osteoarthritis of knee: Secondary | ICD-10-CM | POA: Diagnosis not present

## 2023-07-08 DIAGNOSIS — Z23 Encounter for immunization: Secondary | ICD-10-CM | POA: Diagnosis not present

## 2023-07-13 DIAGNOSIS — M9904 Segmental and somatic dysfunction of sacral region: Secondary | ICD-10-CM | POA: Diagnosis not present

## 2023-07-13 DIAGNOSIS — M9901 Segmental and somatic dysfunction of cervical region: Secondary | ICD-10-CM | POA: Diagnosis not present

## 2023-07-13 DIAGNOSIS — M62830 Muscle spasm of back: Secondary | ICD-10-CM | POA: Diagnosis not present

## 2023-07-13 DIAGNOSIS — M9903 Segmental and somatic dysfunction of lumbar region: Secondary | ICD-10-CM | POA: Diagnosis not present

## 2023-07-17 ENCOUNTER — Ambulatory Visit (INDEPENDENT_AMBULATORY_CARE_PROVIDER_SITE_OTHER): Payer: Medicare Other | Admitting: Otolaryngology

## 2023-07-17 ENCOUNTER — Ambulatory Visit (INDEPENDENT_AMBULATORY_CARE_PROVIDER_SITE_OTHER): Payer: Medicare Other | Admitting: Audiology

## 2023-07-17 ENCOUNTER — Encounter (INDEPENDENT_AMBULATORY_CARE_PROVIDER_SITE_OTHER): Payer: Self-pay

## 2023-07-17 VITALS — Ht 59.0 in | Wt 165.0 lb

## 2023-07-17 DIAGNOSIS — H7402 Tympanosclerosis, left ear: Secondary | ICD-10-CM | POA: Diagnosis not present

## 2023-07-17 DIAGNOSIS — H903 Sensorineural hearing loss, bilateral: Secondary | ICD-10-CM | POA: Diagnosis not present

## 2023-07-17 NOTE — Progress Notes (Signed)
  [address] [place], [place] [address] [phone]  Audiological Evaluation    Name: Misty Smith     DOB:   Apr 12, 1941      MRN:   [id number]                                                                                     Service Date: 07/17/2023     Accompanied by: daughter   Patient comes today after Dr. Suszanne Conners, ENT sent a referral for a hearing evaluation due to concerns with hearing loss.   Symptoms Yes Details  Hearing loss  [x]  Perceived to be worse in the left ear.  Tinnitus  []    Ear pain/ Ear infections  []    Balance problems  []    Noise exposure  []    Previous ear surgeries  [x]  Reports previous left ear tube placement as an adult.  Family history  []    Amplification  []    Other  []      Otoscopy: Right ear: Clear external ear canals and notable landmarks visualized on the tympanic membrane. Left ear:  Clear external ear canals and notable landmarks visualized on the tympanic membrane.  Tympanometry: Right ear: Type A- Normal external ear canal volume with normal middle ear pressure and tympanic membrane compliance Left ear: Type A- Normal external ear canal volume with normal middle ear pressure and tympanic membrane compliance     Pure tone Audiometry: Right ear- Normal to moderate sensorineural hearing loss from 250 Hz - 8000 Hz. Left ear-  Borderline normal to profound essentially sensorineural hearing loss from 250 Hz - 8000 Hz. Of note, an air-bone gap at 4000 Hz.  The hearing test results were completed under headphones and re-checked with inserts and results are deemed to be of good to fair reliability. Test technique:  conventional     Speech Audiometry: Right ear- Speech Reception Threshold (SRT) was obtained at 30 dBHL Left ear-Speech Reception Threshold (SRT) was obtained at 50 dBHL   Word Recognition Score Tested using NU-6 (MLV) Right ear: 68% was obtained at a presentation level of 70 dBHL with contralateral masking  which is deemed as  poor Left ear: 24% was obtained at a presentation level of 80 dBHL with contralateral masking which is deemed as  poor    Impression: There is a significant difference in pure-tone thresholds between ears, worse in the left ear. There is a significant difference in word recognition scores , worse of the left ear.     Recommendations: Follow up with ENT as scheduled for today. Return for a hearing evaluation if concerns with hearing changes arise or per MD recommendation. Consider a communication needs assessment after medical clearance for hearing aids is obtained.   Vue Pavon MARIE LEROUX-MARTINEZ, AUD

## 2023-07-20 DIAGNOSIS — M9904 Segmental and somatic dysfunction of sacral region: Secondary | ICD-10-CM | POA: Diagnosis not present

## 2023-07-20 DIAGNOSIS — M9903 Segmental and somatic dysfunction of lumbar region: Secondary | ICD-10-CM | POA: Diagnosis not present

## 2023-07-20 DIAGNOSIS — M62830 Muscle spasm of back: Secondary | ICD-10-CM | POA: Diagnosis not present

## 2023-07-20 DIAGNOSIS — M9901 Segmental and somatic dysfunction of cervical region: Secondary | ICD-10-CM | POA: Diagnosis not present

## 2023-07-20 DIAGNOSIS — H903 Sensorineural hearing loss, bilateral: Secondary | ICD-10-CM | POA: Insufficient documentation

## 2023-07-20 NOTE — Addendum Note (Signed)
Addended byNewman Pies on: 07/20/2023 07:28 AM   Modules accepted: Orders

## 2023-07-20 NOTE — Progress Notes (Signed)
Patient ID: Misty Smith, female   DOB: October 22, 1940, 82 y.o.   MRN: [id number]  CC: Progressive hearing loss  HPI:  Misty Smith is an 82 y.o. female who presents today complaining of bilateral progressive hearing loss, worse on the left side.  According to the patient, she has been symptomatic for many years.  She has a history of recurrent left middle ear effusion, status post multiple left ear tube placement by Dr. Haroldine Laws.  She has a history of environmental allergies.  She uses over-the-counter allergy medications as needed.  She has no other otologic surgery.  Past Medical History:  Diagnosis Date   Allergy    Anxiety    Arthritis    Bronchitis    Cataract    Colovaginal fistula    Depression    Diabetes mellitus without complication (HCC)    Dysrhythmia    palpitations, occasional PVCs; Dr Jens Som cardiologist   Esophageal stricture    Fractured elbow 1970's   GERD (gastroesophageal reflux disease)    H/O hiatal hernia    Hyperlipidemia    Hypertension    Osteopenia    Perimenopausal vasomotor symptoms    Postmenopausal HRT (hormone replacement therapy)    Pre-diabetes    Stress    Subacute lupus erythematosus    Dr. Terri Piedra    Past Surgical History:  Procedure Laterality Date   abdominal bypass  1987   fibroids    ABDOMINAL HYSTERECTOMY     partial   bilateral tubal ligtation  1972   CHOLECYSTECTOMY N/A 03/21/2014   Procedure: LAPAROSCOPIC CHOLECYSTECTOMY WITH INTRAOPERATIVE CHOLANGIOGRAM;  Surgeon: Kandis Cocking, MD;  Location: [facility] OR;  Service: General;  Laterality: N/A;   COLONOSCOPY     elbow Right 1976   surgery after fracture of elbow   EYE SURGERY Bilateral    cataracts   ROTATOR CUFF REPAIR Right 03/29/2018   TONSILLECTOMY     TUBAL LIGATION      Family History  Problem Relation Age of Onset   Cancer Mother        laryngeal    Hypertension Mother    Heart disease Mother    Hypertension Father    Coronary artery disease Father    Kidney  disease Father        kidney failure    Breast cancer Sister    Hypertension Sister    Cancer Sister        breast   Heart attack Sister    Heart disease Sister    Stroke Sister    Stroke Sister    Hypertension Sister    Hyperlipidemia Sister    Hypertension Daughter    Hypertension Brother    Cancer Brother        liver   Hypertension Brother    Heart attack Brother    Hypertension Son     Social History:  reports that she has never smoked. She has never used smokeless tobacco. She reports that she does not drink alcohol and does not use drugs.  Allergies:  Allergies  Allergen Reactions   Ciprofloxacin Rash   Codeine Rash   Iohexol      Desc: PT HAD HIVES AND WAS GIVEN BENEDRYL PO BY NURSE AND OBSERVED IN NURSES STATION AND RELEASED WITHOUT AND OTHER COMPLICATIONS SHEET SCANNED UNDER NURSES NOTES///ON 12/01/11 PT HAD ANOTHER CT WITH PREMEDS MINUS THE BENADRYL. SHE HAD A DELAYED REACTION AFTER WITH THROAT TIGHTNESS AND SOB, JB  12/07/12    Penicillins Hives  Sulfonamide Derivatives Hives   Asa [Aspirin] Other (See Comments)    Large amounts causes stomach pain   Keflex [Cephalexin] Nausea And Vomiting   Morphine And Codeine Rash    Prior to Admission medications   Medication Sig Start Date End Date Taking? Authorizing Provider  amLODipine (NORVASC) 10 MG tablet TAKE 1 TABLET BY MOUTH EVERY DAY 11/24/22  Yes Lewayne Bunting, MD  aspirin EC 81 MG tablet Take 1 tablet (81 mg total) by mouth daily. 12/30/13  Yes Lewayne Bunting, MD  atorvastatin (LIPITOR) 40 MG tablet TAKE 1 TABLET BY MOUTH EVERYDAY AT BEDTIME 11/24/22  Yes Lewayne Bunting, MD  betamethasone dipropionate 0.05 % cream Apply topically. 04/02/21  Yes [provider]  Calcium Carb-Cholecalciferol 600-800 MG-UNIT TABS Take 1 tablet by mouth every evening.    Yes [provider]  cholecalciferol (VITAMIN D) 1000 UNITS tablet Take 2,000 Units by mouth daily.   Yes [provider]  CLINPRO  5000 1.1 % PSTE Place onto teeth 2 (two) times daily. 02/23/21  Yes [provider]  escitalopram (LEXAPRO) 20 MG tablet TAKE 1 TABLET BY MOUTH EVERY DAY 06/30/23  Yes Dettinger, Elige Radon, MD  esomeprazole (NEXIUM) 40 MG capsule TAKE 1 CAPSULE (40 MG TOTAL) BY MOUTH DAILY. 06/23/23  Yes Dettinger, Elige Radon, MD  estradiol (ESTRACE) 0.1 MG/GM vaginal cream Place vaginally. 10/08/20  Yes [provider]  hydrochlorothiazide (HYDRODIURIL) 12.5 MG tablet TAKE 1 TABLET BY MOUTH EVERY DAY 05/20/23  Yes Dettinger, Elige Radon, MD  hydrocortisone 2.5 % cream PLEASE SEE ATTACHED FOR DETAILED DIRECTIONS 05/04/21  Yes [provider]  irbesartan (AVAPRO) 75 MG tablet TAKE 1 TABLET BY MOUTH EVERY DAY 11/24/22  Yes Crenshaw, Madolyn Frieze, MD  ketoconazole (NIZORAL) 2 % cream Apply topically 2 (two) times daily as needed. 03/12/21  Yes [provider]  metoprolol tartrate (LOPRESSOR) 25 MG tablet TAKE 0.5 TABLETS BY MOUTH 2 TIMES DAILY. 05/21/23  Yes Dettinger, Elige Radon, MD  Multiple Vitamin (MULTIVITAMIN) tablet Take 1 tablet by mouth every morning.  10/26/13  Yes Eckard, Tammy, RPH-CPP  omega-3 acid ethyl esters (LOVAZA) 1 g capsule TAKE 2 CAPSULES BY MOUTH 2 TIMES DAILY. 12/31/22  Yes Dettinger, Elige Radon, MD  pioglitazone (ACTOS) 30 MG tablet TAKE 1 TABLET BY MOUTH EVERY DAY 06/11/23  Yes Dettinger, Elige Radon, MD    Height 4\' 11"  (1.499 m), weight 165 lb (74.8 kg). Exam: General: Communicates without difficulty, well nourished, no acute distress. Head: Normocephalic, no evidence injury, no tenderness, facial buttresses intact without stepoff. Face/sinus: No tenderness to palpation and percussion. Facial movement is normal and symmetric. Eyes: PERRL, EOMI. No scleral icterus, conjunctivae clear. Neuro: CN II exam reveals vision grossly intact.  No nystagmus at any point of gaze. Ears: Auricles well formed without lesions.  Ear canals are intact without mass or lesion.  No erythema or edema is  appreciated.  The TMs are intact without fluid. Nose: External evaluation reveals normal support and skin without lesions.  Dorsum is intact.  Anterior rhinoscopy reveals congested mucosa over anterior aspect of inferior turbinates and intact septum.  No purulence noted. Oral:  Oral cavity and oropharynx are intact, symmetric, without erythema or edema.  Mucosa is moist without lesions. Neck: Full range of motion without pain.  There is no significant lymphadenopathy.  No masses palpable.  Thyroid bed within normal limits to palpation.  Parotid glands and submandibular glands equal bilaterally without mass.  Trachea is midline. Neuro:  CN  2-12 grossly intact.   Her hearing test shows bilateral high-frequency sensorineural hearing loss, worse on the left side.  Assessment: 1.  Bilateral high-frequency sensorineural hearing loss, worse on the left side. 2.  Her ear canals, tympanic membranes, and middle ear spaces are normal.  Tympanosclerosis is noted on the left tympanic membrane.  No middle ear effusion or infection is noted today.  Plan: 1.  The physical exam findings and the hearing test results are reviewed with the patient. 2.  The small possibility of a retrocochlear lesion causing the asymmetric hearing loss is discussed.  The options of conservative observation versus MRI scan are reviewed.  The patient would like to proceed with conservative observation.  3.  The patient is a candidate for hearing amplification. 4.  The patient will return for reevaluation in 1 year.  Austin Pongratz W Celicia Minahan 07/20/2023, 7:24 AM

## 2023-07-24 ENCOUNTER — Encounter: Payer: Self-pay | Admitting: Audiology

## 2023-08-04 ENCOUNTER — Other Ambulatory Visit: Payer: Medicare Other

## 2023-08-04 DIAGNOSIS — E7800 Pure hypercholesterolemia, unspecified: Secondary | ICD-10-CM

## 2023-08-04 DIAGNOSIS — E1169 Type 2 diabetes mellitus with other specified complication: Secondary | ICD-10-CM | POA: Diagnosis not present

## 2023-08-04 DIAGNOSIS — I10 Essential (primary) hypertension: Secondary | ICD-10-CM

## 2023-08-04 LAB — CBC WITH DIFFERENTIAL/PLATELET
Basophils Absolute: 0 10*3/uL (ref 0.0–0.2)
Basos: 0 %
EOS (ABSOLUTE): 0.1 10*3/uL (ref 0.0–0.4)
Eos: 1 %
Hematocrit: 36.3 % (ref 34.0–46.6)
Hemoglobin: 12.1 g/dL (ref 11.1–15.9)
Immature Grans (Abs): 0 10*3/uL (ref 0.0–0.1)
Immature Granulocytes: 0 %
Lymphocytes Absolute: 1.8 10*3/uL (ref 0.7–3.1)
Lymphs: 30 %
MCH: 32.2 pg (ref 26.6–33.0)
MCHC: 33.3 g/dL (ref 31.5–35.7)
MCV: 97 fL (ref 79–97)
Monocytes Absolute: 0.7 10*3/uL (ref 0.1–0.9)
Monocytes: 12 %
Neutrophils Absolute: 3.3 10*3/uL (ref 1.4–7.0)
Neutrophils: 57 %
Platelets: 234 10*3/uL (ref 150–450)
RBC: 3.76 x10E6/uL — ABNORMAL LOW (ref 3.77–5.28)
RDW: 12.9 % (ref 11.7–15.4)
WBC: 5.9 10*3/uL (ref 3.4–10.8)

## 2023-08-04 LAB — LIPID PANEL
Chol/HDL Ratio: 2.7 {ratio} (ref 0.0–4.4)
Cholesterol, Total: 142 mg/dL (ref 100–199)
HDL: 53 mg/dL (ref >39)
LDL Chol Calc (NIH): 66 mg/dL (ref 0–99)
Triglycerides: 135 mg/dL (ref 0–149)
VLDL Cholesterol Cal: 23 mg/dL (ref 5–40)

## 2023-08-04 LAB — CMP14+EGFR
ALT: 18 [IU]/L (ref 0–32)
AST: 31 [IU]/L (ref 0–40)
Albumin: 4 g/dL (ref 3.7–4.7)
Alkaline Phosphatase: 74 [IU]/L (ref 44–121)
BUN/Creatinine Ratio: 18 (ref 12–28)
BUN: 14 mg/dL (ref 8–27)
Bilirubin Total: 0.4 mg/dL (ref 0.0–1.2)
CO2: 24 mmol/L (ref 20–29)
Calcium: 9.9 mg/dL (ref 8.7–10.3)
Chloride: 98 mmol/L (ref 96–106)
Creatinine, Ser: 0.78 mg/dL (ref 0.57–1.00)
Globulin, Total: 2.6 g/dL (ref 1.5–4.5)
Glucose: 98 mg/dL (ref 70–99)
Potassium: 4.8 mmol/L (ref 3.5–5.2)
Sodium: 139 mmol/L (ref 134–144)
Total Protein: 6.6 g/dL (ref 6.0–8.5)
eGFR: 75 mL/min/{1.73_m2} (ref >59)

## 2023-08-04 LAB — BAYER DCA HB A1C WAIVED: HB A1C (BAYER DCA - WAIVED): 6.3 % — ABNORMAL HIGH (ref 4.8–5.6)

## 2023-08-06 ENCOUNTER — Encounter: Payer: Self-pay | Admitting: Family Medicine

## 2023-08-06 ENCOUNTER — Ambulatory Visit (INDEPENDENT_AMBULATORY_CARE_PROVIDER_SITE_OTHER): Payer: Medicare Other | Admitting: Family Medicine

## 2023-08-06 VITALS — BP 127/71 | HR 68 | Ht 59.0 in | Wt 162.0 lb

## 2023-08-06 DIAGNOSIS — I2510 Atherosclerotic heart disease of native coronary artery without angina pectoris: Secondary | ICD-10-CM | POA: Diagnosis not present

## 2023-08-06 DIAGNOSIS — Z23 Encounter for immunization: Secondary | ICD-10-CM

## 2023-08-06 DIAGNOSIS — E1159 Type 2 diabetes mellitus with other circulatory complications: Secondary | ICD-10-CM

## 2023-08-06 DIAGNOSIS — F411 Generalized anxiety disorder: Secondary | ICD-10-CM | POA: Diagnosis not present

## 2023-08-06 DIAGNOSIS — E1169 Type 2 diabetes mellitus with other specified complication: Secondary | ICD-10-CM | POA: Diagnosis not present

## 2023-08-06 DIAGNOSIS — E7800 Pure hypercholesterolemia, unspecified: Secondary | ICD-10-CM

## 2023-08-06 DIAGNOSIS — I10 Essential (primary) hypertension: Secondary | ICD-10-CM | POA: Diagnosis not present

## 2023-08-06 MED ORDER — ESCITALOPRAM OXALATE 20 MG PO TABS: 20.0000 mg | ORAL_TABLET | Freq: Every day | ORAL | 3 refills | Status: DC

## 2023-08-06 MED ORDER — ESOMEPRAZOLE MAGNESIUM 40 MG PO CPDR: 40.0000 mg | DELAYED_RELEASE_CAPSULE | Freq: Every day | ORAL | 3 refills | Status: DC

## 2023-08-06 MED ORDER — HYDROCHLOROTHIAZIDE 12.5 MG PO TABS: 12.5000 mg | ORAL_TABLET | Freq: Every day | ORAL | 3 refills | Status: DC

## 2023-08-06 MED ORDER — PIOGLITAZONE HCL 30 MG PO TABS: 30.0000 mg | ORAL_TABLET | Freq: Every day | ORAL | 3 refills | Status: DC

## 2023-08-06 NOTE — Progress Notes (Signed)
BP 127/71   Pulse 68   Ht 4\' 11"  (1.499 m)   Wt 162 lb (73.5 kg)   SpO2 97%   BMI 32.72 kg/m    Subjective:   Patient ID: Misty Smith, female    DOB: [birth year], 82 y.o.   MRN: [id number]  HPI: Misty Smith is a 82 y.o. female presenting on 08/06/2023 for Medical Management of Chronic Issues, Diabetes, and Hyperlipidemia   HPI Type 2 diabetes mellitus Patient comes in today for recheck of his diabetes. Patient has been currently taking Actos and the irbesartan. Patient is currently on an ACE inhibitor/ARB. Patient has seen an ophthalmologist this year. Patient denies any new issues with their feet. The symptom started onset as an adult hypertension and hyperlipidemia and CAD ARE RELATED TO DM   Hypertension Patient is currently on amlodipine and irbesartan and metoprolol and hydrochlorothiazide, and their blood pressure today is 127/71. Patient denies any lightheadedness or dizziness. Patient denies headaches, blurred vision, chest pains, shortness of breath, or weakness. Denies any side effects from medication and is content with current medication.   Hyperlipidemia Patient is coming in for recheck of his hyperlipidemia. The patient is currently taking atorvastatin and fish oils. They deny any issues with myalgias or history of liver damage from it. They deny any focal numbness or weakness or chest pain.   Anxiety recheck Patient is coming in today for anxiety recheck and currently takes Lexapro.  Patient says her anxiety is up a little bit and she is only taking a half a tablet of the Lexapro.  She says she thought about going up but has not yet.  She is worried about possible side effects if she were to go up.  She is hesitant to go up and will think on a little bit more.  She says is not overwhelming but she does have anxiety frequently.    08/06/2023    8:13 AM 06/17/2023    4:10 PM 06/17/2023    4:09 PM 04/06/2023    8:22 AM 02/11/2023    2:12 PM  Depression screen PHQ  2/9  Decreased Interest 0  0 0 0  Down, Depressed, Hopeless 0  0 0 0  PHQ - 2 Score 0  0 0 0  Altered sleeping 0 0  0 0  Tired, decreased energy 0 0  0 0  Change in appetite 0 0  0 0  Feeling bad or failure about yourself  0 0  0 0  Trouble concentrating 0 0  0 0  Moving slowly or fidgety/restless 0 0  0 0  Suicidal thoughts 0 0  0 0  PHQ-9 Score 0   0 0  Difficult doing work/chores Not difficult at all Not difficult at all  Not difficult at all Not difficult at all     Relevant past medical, surgical, family and social history reviewed and updated as indicated. Interim medical history since our last visit reviewed. Allergies and medications reviewed and updated.  Review of Systems  Constitutional:  Negative for chills and fever.  Eyes:  Negative for visual disturbance.  Respiratory:  Negative for chest tightness and shortness of breath.   Cardiovascular:  Negative for chest pain and leg swelling.  Genitourinary:  Negative for difficulty urinating and dysuria.  Musculoskeletal:  Negative for back pain and gait problem.  Skin:  Negative for rash.  Neurological:  Negative for dizziness, light-headedness and headaches.  Psychiatric/Behavioral:  Negative for agitation, behavioral problems and dysphoric  mood. The patient is nervous/anxious.   All other systems reviewed and are negative.   Per HPI unless specifically indicated above   Allergies as of 08/06/2023       Reactions   Ciprofloxacin Rash   Codeine Rash   Iohexol     Desc: PT HAD HIVES AND WAS GIVEN BENEDRYL PO BY NURSE AND OBSERVED IN NURSES STATION AND RELEASED WITHOUT AND OTHER COMPLICATIONS SHEET SCANNED UNDER NURSES NOTES///ON 12/01/11 PT HAD ANOTHER CT WITH PREMEDS MINUS THE BENADRYL. SHE HAD A DELAYED REACTION AFTER WITH THROAT TIGHTNESS AND SOB, JB  12/07/12   Penicillins Hives   Sulfonamide Derivatives Hives   Asa [aspirin] Other (See Comments)   Large amounts causes stomach pain   Keflex [cephalexin] Nausea And  Vomiting   Morphine And Codeine Rash        Medication List        Accurate as of August 06, 2023  8:33 AM. If you have any questions, ask your nurse or doctor.          amLODipine 10 MG tablet Commonly known as: NORVASC TAKE 1 TABLET BY MOUTH EVERY DAY   aspirin EC 81 MG tablet Take 1 tablet (81 mg total) by mouth daily.   atorvastatin 40 MG tablet Commonly known as: LIPITOR TAKE 1 TABLET BY MOUTH EVERYDAY AT BEDTIME   betamethasone dipropionate 0.05 % cream Apply topically.   Calcium Carb-Cholecalciferol 600-800 MG-UNIT Tabs Take 1 tablet by mouth every evening.   cholecalciferol 1000 units tablet Commonly known as: VITAMIN D Take 2,000 Units by mouth daily.   Clinpro 5000 1.1 % Pste Generic drug: Sodium Fluoride Place onto teeth 2 (two) times daily.   escitalopram 20 MG tablet Commonly known as: LEXAPRO Take 1 tablet (20 mg total) by mouth daily.   esomeprazole 40 MG capsule Commonly known as: NEXIUM Take 1 capsule (40 mg total) by mouth daily.   estradiol 0.1 MG/GM vaginal cream Commonly known as: ESTRACE Place vaginally.   hydrochlorothiazide 12.5 MG tablet Commonly known as: HYDRODIURIL Take 1 tablet (12.5 mg total) by mouth daily.   hydrocortisone 2.5 % cream PLEASE SEE ATTACHED FOR DETAILED DIRECTIONS   irbesartan 75 MG tablet Commonly known as: AVAPRO TAKE 1 TABLET BY MOUTH EVERY DAY   ketoconazole 2 % cream Commonly known as: NIZORAL Apply topically 2 (two) times daily as needed.   metoprolol tartrate 25 MG tablet Commonly known as: LOPRESSOR TAKE 0.5 TABLETS BY MOUTH 2 TIMES DAILY.   multivitamin tablet Take 1 tablet by mouth every morning.   omega-3 acid ethyl esters 1 g capsule Commonly known as: LOVAZA TAKE 2 CAPSULES BY MOUTH 2 TIMES DAILY.   pioglitazone 30 MG tablet Commonly known as: ACTOS Take 1 tablet (30 mg total) by mouth daily.         Objective:   BP 127/71   Pulse 68   Ht 4\' 11"  (1.499 m)   Wt 162  lb (73.5 kg)   SpO2 97%   BMI 32.72 kg/m   Wt Readings from Last 3 Encounters:  08/06/23 162 lb (73.5 kg)  07/17/23 165 lb (74.8 kg)  06/17/23 167 lb (75.8 kg)    Physical Exam Vitals and nursing note reviewed.  Constitutional:      General: She is not in acute distress.    Appearance: She is well-developed. She is not diaphoretic.  Eyes:     Conjunctiva/sclera: Conjunctivae normal.  Cardiovascular:     Rate and Rhythm: Normal rate and regular  rhythm.     Heart sounds: Normal heart sounds. No murmur heard. Pulmonary:     Effort: Pulmonary effort is normal. No respiratory distress.     Breath sounds: Normal breath sounds. No wheezing.  Musculoskeletal:        General: No swelling or tenderness. Normal range of motion.  Skin:    General: Skin is warm and dry.     Findings: No rash.  Neurological:     Mental Status: She is alert and oriented to person, place, and time.     Coordination: Coordination normal.  Psychiatric:        Behavior: Behavior normal.     Results for orders placed or performed in visit on 08/04/23  CBC with Differential/Platelet  Result Value Ref Range   WBC 5.9 3.4 - 10.8 x10E3/uL   RBC 3.76 (L) 3.77 - 5.28 x10E6/uL   Hemoglobin 12.1 11.1 - 15.9 g/dL   Hematocrit 16.1 09.6 - 46.6 %   MCV 97 79 - 97 fL   MCH 32.2 26.6 - 33.0 pg   MCHC 33.3 31.5 - 35.7 g/dL   RDW 04.5 40.9 - 81.1 %   Platelets 234 150 - 450 x10E3/uL   Neutrophils 57 Not Estab. %   Lymphs 30 Not Estab. %   Monocytes 12 Not Estab. %   Eos 1 Not Estab. %   Basos 0 Not Estab. %   Neutrophils Absolute 3.3 1.4 - 7.0 x10E3/uL   Lymphocytes Absolute 1.8 0.7 - 3.1 x10E3/uL   Monocytes Absolute 0.7 0.1 - 0.9 x10E3/uL   EOS (ABSOLUTE) 0.1 0.0 - 0.4 x10E3/uL   Basophils Absolute 0.0 0.0 - 0.2 x10E3/uL   Immature Granulocytes 0 Not Estab. %   Immature Grans (Abs) 0.0 0.0 - 0.1 x10E3/uL  CMP14+EGFR  Result Value Ref Range   Glucose 98 70 - 99 mg/dL   BUN 14 8 - 27 mg/dL   Creatinine,  Ser 9.14 0.57 - 1.00 mg/dL   eGFR 75 >78 GN/FAO/1.30   BUN/Creatinine Ratio 18 12 - 28   Sodium 139 134 - 144 mmol/L   Potassium 4.8 3.5 - 5.2 mmol/L   Chloride 98 96 - 106 mmol/L   CO2 24 20 - 29 mmol/L   Calcium 9.9 8.7 - 10.3 mg/dL   Total Protein 6.6 6.0 - 8.5 g/dL   Albumin 4.0 3.7 - 4.7 g/dL   Globulin, Total 2.6 1.5 - 4.5 g/dL   Bilirubin Total 0.4 0.0 - 1.2 mg/dL   Alkaline Phosphatase 74 44 - 121 IU/L   AST 31 0 - 40 IU/L   ALT 18 0 - 32 IU/L  Lipid panel  Result Value Ref Range   Cholesterol, Total 142 100 - 199 mg/dL   Triglycerides 865 0 - 149 mg/dL   HDL 53 >78 mg/dL   VLDL Cholesterol Cal 23 5 - 40 mg/dL   LDL Chol Calc (NIH) 66 0 - 99 mg/dL   Chol/HDL Ratio 2.7 0.0 - 4.4 ratio  Bayer DCA Hb A1c Waived  Result Value Ref Range   HB A1C (BAYER DCA - WAIVED) 6.3 (H) 4.8 - 5.6 %    Assessment & Plan:   Problem List Items Addressed This Visit       Cardiovascular and Mediastinum   Essential hypertension   Relevant Medications   hydrochlorothiazide (HYDRODIURIL) 12.5 MG tablet   CAD (coronary artery disease)   Relevant Medications   hydrochlorothiazide (HYDRODIURIL) 12.5 MG tablet     Endocrine   Type  2 diabetes mellitus with other specified complication (HCC)   Relevant Medications   pioglitazone (ACTOS) 30 MG tablet     Other   Hyperlipidemia - Primary   Relevant Medications   hydrochlorothiazide (HYDRODIURIL) 12.5 MG tablet   Generalized anxiety disorder   Relevant Medications   escitalopram (LEXAPRO) 20 MG tablet    Continue current medicine, seems to be doing well.  A1c looks good at 6.3 and the rest of her blood work looked really good. Follow up plan: Return in about 4 months (around 12/05/2023), or if symptoms worsen or fail to improve, for Diabetes recheck.  Counseling provided for all of the vaccine components No orders of the defined types were placed in this encounter.   Arville Care, MD [facility] 08/06/2023, 8:33 AM

## 2023-08-10 ENCOUNTER — Telehealth: Payer: Self-pay | Admitting: Cardiology

## 2023-08-10 NOTE — Telephone Encounter (Signed)
Patient wants provider switch from Dr. Jens Som in [place] to Dr. Antoine Poche in [place] which is closer to her home.

## 2023-08-18 DIAGNOSIS — D3131 Benign neoplasm of right choroid: Secondary | ICD-10-CM | POA: Diagnosis not present

## 2023-08-18 DIAGNOSIS — H353131 Nonexudative age-related macular degeneration, bilateral, early dry stage: Secondary | ICD-10-CM | POA: Diagnosis not present

## 2023-08-18 DIAGNOSIS — D3132 Benign neoplasm of left choroid: Secondary | ICD-10-CM | POA: Diagnosis not present

## 2023-08-18 DIAGNOSIS — Z961 Presence of intraocular lens: Secondary | ICD-10-CM | POA: Diagnosis not present

## 2023-08-18 DIAGNOSIS — H04123 Dry eye syndrome of bilateral lacrimal glands: Secondary | ICD-10-CM | POA: Diagnosis not present

## 2023-08-18 DIAGNOSIS — H3581 Retinal edema: Secondary | ICD-10-CM | POA: Diagnosis not present

## 2023-09-04 ENCOUNTER — Ambulatory Visit: Payer: Medicare Other | Admitting: Nurse Practitioner

## 2023-09-04 ENCOUNTER — Encounter: Payer: Self-pay | Admitting: Nurse Practitioner

## 2023-09-04 VITALS — BP 125/54 | HR 60 | Temp 97.8°F | Resp 20 | Ht 59.0 in | Wt 161.0 lb

## 2023-09-04 DIAGNOSIS — M5431 Sciatica, right side: Secondary | ICD-10-CM

## 2023-09-04 MED ORDER — METHYLPREDNISOLONE ACETATE 80 MG/ML IJ SUSP
80.0000 mg | Freq: Once | INTRAMUSCULAR | Status: AC
  Administered 2023-09-04: 80 mg via INTRAMUSCULAR

## 2023-09-04 NOTE — Patient Instructions (Signed)
 Sciatica  Sciatica is pain, weakness, tingling, or loss of feeling (numbness) along the sciatic nerve. The sciatic nerve starts in the lower back and goes down the back of each leg. Sciatica usually affects one side of the body. Sciatica usually goes away on its own or with treatment. Sometimes, sciatica may come back. What are the causes? This condition happens when the sciatic nerve is pinched or has pressure put on it. This may be caused by: A disk in between the bones of the spine bulging out too far (herniated disk). Changes in the spinal disks due to aging. A condition that affects a muscle in the butt. Extra bone growth near the sciatic nerve. A break (fracture) of the area between your hip bones (pelvis). Pregnancy. Tumor. This is rare. What increases the risk? You are more likely to develop this condition if you: Play sports that put pressure or stress on the spine. Have poor strength and ease of movement (flexibility). Have had a back injury or back surgery. Sit for long periods of time. Do activities that involve bending or lifting over and over again. Are very overweight (obese). What are the signs or symptoms? Symptoms can vary from mild to very bad. They may include: Any of these problems in the lower back, leg, hip, or butt: Mild tingling, loss of feeling, or dull aches. A burning feeling. Sharp pains. Loss of feeling in the back of the calf or the sole of the foot. Leg weakness. Very bad back pain that makes it hard to move. These symptoms may get worse when you cough, sneeze, or laugh. They may also get worse when you sit or stand for long periods of time. How is this treated? This condition often gets better without any treatment. However, treatment may include: Changing or cutting back on physical activity when you have pain. Exercising, including strengthening and stretching. Putting ice or heat on the affected area. Shots of medicines to relieve pain and  swelling or to relax your muscles. Surgery. Follow these instructions at home: Medicines Take over-the-counter and prescription medicines only as told by your doctor. Ask your doctor if you should avoid driving or using machines while you are taking your medicine. Managing pain     If told, put ice on the affected area. To do this: Put ice in a plastic bag. Place a towel between your skin and the bag. Leave the ice on for 20 minutes, 2-3 times a day. If your skin turns bright red, take off the ice right away to prevent skin damage. The risk of skin damage is higher if you cannot feel pain, heat, or cold. If told, put heat on the affected area. Do this as often as told by your doctor. Use the heat source that your doctor tells you to use, such as a moist heat pack or a heating pad. Place a towel between your skin and the heat source. Leave the heat on for 20-30 minutes. If your skin turns bright red, take off the heat right away to prevent burns. The risk of burns is higher if you cannot feel pain, heat, or cold. Activity  Return to your normal activities when your doctor says that it is safe. Avoid activities that make your symptoms worse. Take short rests during the day. When you rest for a long time, do some physical activity or stretching between periods of rest. Avoid sitting for a long time without moving. Get up and move around at least one time each  hour. Do exercises and stretches as told by your doctor. Do not lift anything that is heavier than 10 lb (4.5 kg). Avoid lifting heavy things even when you do not have symptoms. Avoid lifting heavy things over and over. When you lift objects, always lift in a way that is safe for your body. To do this, you should: Bend your knees. Keep the object close to your body. Avoid twisting. General instructions Stay at a healthy weight. Wear comfortable shoes that support your feet. Avoid wearing high heels. Avoid sleeping on a mattress  that is too soft or too hard. You might have less pain if you sleep on a mattress that is firm enough to support your back. Contact a doctor if: Your pain is not controlled by medicine. Your pain does not get better. Your pain gets worse. Your pain lasts longer than 4 weeks. You lose weight without trying. Get help right away if: You cannot control when you pee (urinate) or poop (have a bowel movement). You have weakness in any of these areas and it gets worse: Lower back. The area between your hip bones. Butt. Legs. You have redness or swelling of your back. You have a burning feeling when you pee. Summary Sciatica is pain, weakness, tingling, or loss of feeling (numbness) along the sciatic nerve. This may include the lower back, legs, hips, and butt. This condition happens when the sciatic nerve is pinched or has pressure put on it. Treatment often includes rest, exercise, medicines, and putting ice or heat on the affected area. This information is not intended to replace advice given to you by your health care provider. Make sure you discuss any questions you have with your health care provider. Document Revised: 11/25/2021 Document Reviewed: 11/25/2021 Elsevier Patient Education  2024 [employer].

## 2023-09-04 NOTE — Progress Notes (Signed)
 Subjective:    Patient ID: Misty Smith, female    DOB: 10/03/1940, 83 y.o.   MRN: [id number]   Chief Complaint: Hip Pain (Right hip. Radiating down leg)   Back Pain This is a new problem. The current episode started in the past 7 days. The problem occurs constantly. The problem has been waxing and waning since onset. The pain is present in the gluteal. The quality of the pain is described as aching, burning and shooting. The pain radiates to the right thigh. The pain is at a severity of 8/10. The pain is moderate. The pain is The same all the time. The symptoms are aggravated by standing and bending. Associated symptoms include leg pain. Pertinent negatives include no abdominal pain, chest pain, headaches or weakness. She has tried ice and heat (biofreeze) for the symptoms. The treatment provided mild relief.    Patient Active Problem List   Diagnosis Date Noted   Sensorineural hearing loss, 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   Osteoarthritis of knee 09/11/2021   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   Upper airway cough syndrome 02/08/2021   Type 2 diabetes mellitus with other specified complication (HCC) 01/30/2021   Lumbar radiculopathy 01/11/2021   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   Impingement syndrome of shoulder region 09/26/2017   Vitamin D  deficiency 03/18/2016   CAD (coronary artery disease) 12/30/2013   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   Osteopenia of the elderly 10/26/2013   Generalized anxiety disorder 09/21/2013   GERD (gastroesophageal reflux disease) 09/21/2013   Multiple pulmonary nodules determined by computed tomography of lung 09/21/2013   Hyperlipidemia 02/01/2009   Essential hypertension 02/01/2009       Review of Systems  Constitutional:  Negative for diaphoresis.  Eyes:  Negative for pain.  Respiratory:  Negative for shortness of breath.   Cardiovascular:  Negative for chest pain, palpitations and leg swelling.  Gastrointestinal:   Negative for abdominal pain.  Endocrine: Negative for polydipsia.  Musculoskeletal:  Positive for back pain.  Skin:  Negative for rash.  Neurological:  Negative for dizziness, weakness and headaches.  Hematological:  Does not bruise/bleed easily.  All other systems reviewed and are negative.      Objective:   Physical Exam Vitals reviewed.  Constitutional:      Appearance: Normal appearance. She is obese.  Cardiovascular:     Rate and Rhythm: Normal rate and regular rhythm.  Pulmonary:     Effort: Pulmonary effort is normal.     Breath sounds: Normal breath sounds.  Musculoskeletal:     Comments: FROM of lumbar spine with pain on rotation (-) SLR bil Motor strength and sensation distally intact  Skin:    General: Skin is warm.  Neurological:     General: No focal deficit present.     Mental Status: She is alert and oriented to person, place, and time.  Psychiatric:        Mood and Affect: Mood normal.        Behavior: Behavior normal.     BP (!) 125/54   Pulse 60   Temp 97.8 F (36.6 C) (Temporal)   Resp 20   Ht 4' 11 (1.499 m)   Wt 161 lb (73 kg)   BMI 32.52 kg/m        Assessment & Plan:   Misty Smith in today with chief complaint of Hip Pain (Right hip. Radiating down leg)   1. Sciatica of right side (Primary) Moist heat Rest  RTO prn - methylPREDNISolone  acetate (  DEPO-MEDROL ) injection 80 mg    The above assessment and management plan was discussed with the patient. The patient verbalized understanding of and has agreed to the management plan. Patient is aware to call the clinic if symptoms persist or worsen. Patient is aware when to return to the clinic for a follow-up visit. Patient educated on when it is appropriate to go to the emergency department.   [name], FNP

## 2023-09-08 DIAGNOSIS — E1142 Type 2 diabetes mellitus with diabetic polyneuropathy: Secondary | ICD-10-CM | POA: Diagnosis not present

## 2023-09-08 DIAGNOSIS — M79676 Pain in unspecified toe(s): Secondary | ICD-10-CM | POA: Diagnosis not present

## 2023-09-08 DIAGNOSIS — B351 Tinea unguium: Secondary | ICD-10-CM | POA: Diagnosis not present

## 2023-09-08 DIAGNOSIS — L84 Corns and callosities: Secondary | ICD-10-CM | POA: Diagnosis not present

## 2023-09-14 DIAGNOSIS — M62830 Muscle spasm of back: Secondary | ICD-10-CM | POA: Diagnosis not present

## 2023-09-14 DIAGNOSIS — M9903 Segmental and somatic dysfunction of lumbar region: Secondary | ICD-10-CM | POA: Diagnosis not present

## 2023-09-14 DIAGNOSIS — M9901 Segmental and somatic dysfunction of cervical region: Secondary | ICD-10-CM | POA: Diagnosis not present

## 2023-09-14 DIAGNOSIS — M9904 Segmental and somatic dysfunction of sacral region: Secondary | ICD-10-CM | POA: Diagnosis not present

## 2023-09-18 DIAGNOSIS — M5416 Radiculopathy, lumbar region: Secondary | ICD-10-CM | POA: Diagnosis not present

## 2023-09-18 DIAGNOSIS — M25561 Pain in right knee: Secondary | ICD-10-CM | POA: Diagnosis not present

## 2023-09-28 DIAGNOSIS — M62830 Muscle spasm of back: Secondary | ICD-10-CM | POA: Diagnosis not present

## 2023-09-28 DIAGNOSIS — M9903 Segmental and somatic dysfunction of lumbar region: Secondary | ICD-10-CM | POA: Diagnosis not present

## 2023-09-28 DIAGNOSIS — M9904 Segmental and somatic dysfunction of sacral region: Secondary | ICD-10-CM | POA: Diagnosis not present

## 2023-09-28 DIAGNOSIS — M9901 Segmental and somatic dysfunction of cervical region: Secondary | ICD-10-CM | POA: Diagnosis not present

## 2023-09-29 ENCOUNTER — Other Ambulatory Visit: Payer: Self-pay

## 2023-09-29 ENCOUNTER — Encounter: Payer: Self-pay | Admitting: Physical Therapy

## 2023-09-29 ENCOUNTER — Ambulatory Visit: Payer: Medicare Other | Attending: Physical Medicine and Rehabilitation | Admitting: Physical Therapy

## 2023-09-29 DIAGNOSIS — M62830 Muscle spasm of back: Secondary | ICD-10-CM | POA: Insufficient documentation

## 2023-09-29 DIAGNOSIS — M5459 Other low back pain: Secondary | ICD-10-CM | POA: Diagnosis not present

## 2023-09-29 DIAGNOSIS — R293 Abnormal posture: Secondary | ICD-10-CM | POA: Diagnosis not present

## 2023-09-29 NOTE — Therapy (Signed)
OUTPATIENT PHYSICAL THERAPY THORACOLUMBAR EVALUATION   Patient Name: Misty Smith MRN: [id number] DOB:17-Oct-1940, 83 y.o., female Today's Date: 09/29/2023  END OF SESSION:  PT End of Session - 09/29/23 0916     Visit Number 1    Number of Visits 12    Date for PT Re-Evaluation 11/10/23    PT Start Time 0847    PT Stop Time 0937    PT Time Calculation (min) 50 min    Activity Tolerance Patient tolerated treatment well    Behavior During Therapy [facility] for tasks assessed/performed             Past Medical History:  Diagnosis Date   Allergy    Anxiety    Arthritis    Bronchitis    Cataract    Colovaginal fistula    Depression    Diabetes mellitus without complication (HCC)    Dysrhythmia    palpitations, occasional PVCs; Dr Jens Som cardiologist   Esophageal stricture    Fractured elbow 1970's   GERD (gastroesophageal reflux disease)    H/O hiatal hernia    Hyperlipidemia    Hypertension    Osteopenia    Perimenopausal vasomotor symptoms    Postmenopausal HRT (hormone replacement therapy)    Pre-diabetes    Stress    Subacute lupus erythematosus    Dr. Terri Piedra   Past Surgical History:  Procedure Laterality Date   abdominal bypass  1987   fibroids    ABDOMINAL HYSTERECTOMY     partial   bilateral tubal ligtation  1972   CHOLECYSTECTOMY N/A 03/21/2014   Procedure: LAPAROSCOPIC CHOLECYSTECTOMY WITH INTRAOPERATIVE CHOLANGIOGRAM;  Surgeon: Kandis Cocking, MD;  Location: [facility] OR;  Service: General;  Laterality: N/A;   COLONOSCOPY     elbow Right 1976   surgery after fracture of elbow   EYE SURGERY Bilateral    cataracts   ROTATOR CUFF REPAIR Right 03/29/2018   TONSILLECTOMY     TUBAL LIGATION     Patient Active Problem List   Diagnosis Date Noted   Sensorineural hearing loss, 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   Osteoarthritis of knee 09/11/2021   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   Upper airway cough syndrome 02/08/2021   Type 2 diabetes mellitus with other  specified complication (HCC) 01/30/2021   Lumbar radiculopathy 01/11/2021   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   Impingement syndrome of shoulder region 09/26/2017   Vitamin D deficiency 03/18/2016   CAD (coronary artery disease) 12/30/2013   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   Osteopenia of the elderly 10/26/2013   Generalized anxiety disorder 09/21/2013   GERD (gastroesophageal reflux disease) 09/21/2013   Multiple pulmonary 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  SUBJECTIVE:  SUBJECTIVE STATEMENT: The patient presents to the clinic with c/o right sided low back pain that will radiate into her right buttock region and she experiences right groin pain.  This occurred after blowing leave in Mid-December of 2024.  She received an injection but it was not helpful.  Certain movements produce severe sharp pain.  Seated comfortably decrease her pain.    PERTINENT HISTORY:  H/o LBP.  PAIN:  Are you having pain? Yes: NPRS scale: 8/10. Pain location: Righ LB/SIJ Pain description: Sharp. Aggravating factors: As above. Relieving factors: As above.    PRECAUTIONS: None  RED FLAGS: None   WEIGHT BEARING RESTRICTIONS: No  FALLS:  Has patient fallen in last 6 months? No  LIVING ENVIRONMENT: Lives with: lives with their family Lives in: House/apartment Has following equipment at home:  Patient uses a Hurry cane.  OCCUPATION: Retired.  PLOF:  Independent with basic ADLs and Independent with household mobility with device  PATIENT GOALS: Get back to where she was before the onset of this pain.   OBJECTIVE:  Note: Objective measures were completed at Evaluation unless otherwise noted.    POSTURE: rounded shoulders, forward head, decreased lumbar lordosis, increased thoracic kyphosis, flexed trunk , and Bilateral knee flexion (-15 degrees).  PALPATION: Very tender to palpation over right SIJ and proximal gluteal musculature.  LUMBAR ROM:   Lumbar flexion limited by 50% and extension to neutral.  LOWER EXTREMITY ROM:     Right hip IR limited to neutral.  LOWER EXTREMITY MMT:   The patient is able to provide a normal strength grade for bilateral LE's via manual muscle testing.    LUMBAR SPECIAL TESTS:  Equal leg lengths. (+) right SLR.  Pain with attempted right FABER test.   Patellar reflexes are 3+/4+ and Achilles are 2+/4+.  GAIT: The patient's gait is antalgic in nature and she walks in a flexed trunk posture with a Hurry cane.    TREATMENT DATE: 09/29/23:     HMP and IFC at 80-150 Hz on 40% scan x 20 minutes to patient's right LB/SIJ region.  She enjoyed treatment and felt better following with a normal modality response following removal of modality.                                                                                                                                PATIENT EDUCATION:  Education details: Sleeping posture with pillows between knees. Person educated: Patient Education method: Explanation Education comprehension: verbalized understanding  HOME EXERCISE PROGRAM:   ASSESSMENT:  CLINICAL IMPRESSION: The patient presents to [facility] with c/o right sided low back pain that has been ongoing since Mid-December of last year after blowing leaves.  She is very tender to palpation over her right SIJ.  She had pain reproduction with a right SLR and attempts at a FABER test.  Her right hip IR is  limited and she experiences pain into her right buttock and groin region.  She has multiple postural abnormalities and walks with an antalgic gait pattern in a flexed trunk posture with a 'Hurry" cane.  Patient will benefit from skilled physical therapy intervention to address pain and deficits.  OBJECTIVE IMPAIRMENTS: decreased activity tolerance, decreased ROM, increased muscle spasms, postural dysfunction, and pain.   ACTIVITY LIMITATIONS: carrying, lifting, bending, standing, bed mobility, and locomotion level  PARTICIPATION LIMITATIONS: meal prep, cleaning, laundry, and community activity  PERSONAL FACTORS: 1 comorbidity: h/o LBP  are also affecting patient's functional outcome.   REHAB POTENTIAL: Good  CLINICAL DECISION MAKING: Evolving/moderate complexity  EVALUATION COMPLEXITY: Low   GOALS: LONG TERM GOALS: Target date: 11/09/13  Ind with a HEP.  Goal status: INITIAL  2.  Perform ADL's with pain not > 2-3/10.  Goal status: INITIAL  3.  Eliminate right buttock pain.  Goal status: INITIAL  4.  Walk in upright posture with pain not > 3/10.  Goal status: INITIAL  PLAN:  PT FREQUENCY: 2x/week  PT DURATION: 6 weeks  PLANNED INTERVENTIONS: 97110-Therapeutic exercises, 97530- Therapeutic activity, [id number]- Neuromuscular re-education, 97535- Self Care, 45409- Manual therapy, 97014- Electrical stimulation (unattended), 97035- Ultrasound, Patient/Family education, Dry Needling, Cryotherapy, and Moist heat.  PLAN FOR NEXT SESSION: Combo e'stim/US at 1.50 W/CM2, STW/M, Core exercise progression, spinal protection techniques and body mechanics training.   Ruhan Borak, [place], PT 09/29/2023, 11:20 AM

## 2023-09-30 ENCOUNTER — Ambulatory Visit: Payer: Medicare Other | Admitting: Physical Therapy

## 2023-09-30 DIAGNOSIS — M62830 Muscle spasm of back: Secondary | ICD-10-CM

## 2023-09-30 DIAGNOSIS — M5459 Other low back pain: Secondary | ICD-10-CM

## 2023-09-30 DIAGNOSIS — R293 Abnormal posture: Secondary | ICD-10-CM | POA: Diagnosis not present

## 2023-09-30 NOTE — Therapy (Signed)
OUTPATIENT PHYSICAL THERAPY THORACOLUMBAR TREATMENT  Patient Name: Misty Smith MRN: [id number] DOB:[birth year], 83 y.o., female Today's Date: 09/30/2023  END OF SESSION:  PT End of Session - 09/30/23 0812     Visit Number 2    Number of Visits 12    Date for PT Re-Evaluation 11/10/23    PT Start Time 0800    PT Stop Time 0856    PT Time Calculation (min) 56 min    Activity Tolerance Patient tolerated treatment well    Behavior During Therapy [facility] for tasks assessed/performed             Past Medical History:  Diagnosis Date   Allergy    Anxiety    Arthritis    Bronchitis    Cataract    Colovaginal fistula    Depression    Diabetes mellitus without complication (HCC)    Dysrhythmia    palpitations, occasional PVCs; Dr Jens Som cardiologist   Esophageal stricture    Fractured elbow 1970's   GERD (gastroesophageal reflux disease)    H/O hiatal hernia    Hyperlipidemia    Hypertension    Osteopenia    Perimenopausal vasomotor symptoms    Postmenopausal HRT (hormone replacement therapy)    Pre-diabetes    Stress    Subacute lupus erythematosus    Dr. Terri Piedra   Past Surgical History:  Procedure Laterality Date   abdominal bypass  1987   fibroids    ABDOMINAL HYSTERECTOMY     partial   bilateral tubal ligtation  1972   CHOLECYSTECTOMY N/A 03/21/2014   Procedure: LAPAROSCOPIC CHOLECYSTECTOMY WITH INTRAOPERATIVE CHOLANGIOGRAM;  Surgeon: Kandis Cocking, MD;  Location: [facility] OR;  Service: General;  Laterality: N/A;   COLONOSCOPY     elbow Right 1976   surgery after fracture of elbow   EYE SURGERY Bilateral    cataracts   ROTATOR CUFF REPAIR Right 03/29/2018   TONSILLECTOMY     TUBAL LIGATION     Patient Active Problem List   Diagnosis Date Noted   Sensorineural hearing loss, 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   Osteoarthritis of knee 09/11/2021   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   Upper airway cough syndrome 02/08/2021   Type 2 diabetes mellitus with other specified  complication (HCC) 01/30/2021   Lumbar radiculopathy 01/11/2021   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   Impingement syndrome of shoulder region 09/26/2017   Vitamin D deficiency 03/18/2016   CAD (coronary artery disease) 12/30/2013   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   Osteopenia of the elderly 10/26/2013   Generalized anxiety disorder 09/21/2013   GERD (gastroesophageal reflux disease) 09/21/2013   Multiple pulmonary 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  SUBJECTIVE:  SUBJECTIVE STATEMENT: Sore from last treatment and still sore around injection site (received on 09/04/23 with small bruise noted).   PERTINENT HISTORY:  H/o LBP.  PAIN:  Are you having pain? Yes: NPRS scale: 8/10. Pain location: Righ LB/SIJ Pain description: Sharp. Aggravating factors: As above. Relieving factors: As above.    PRECAUTIONS: None  RED FLAGS: None   WEIGHT BEARING RESTRICTIONS: No  FALLS:  Has patient fallen in last 6 months? No  LIVING ENVIRONMENT: Lives with: lives with their family Lives in: House/apartment Has following equipment at home:  Patient uses a Hurry cane.  OCCUPATION: Retired.  PLOF: Independent with basic ADLs and Independent with household mobility with device  PATIENT GOALS: Get back to where she was before the onset of this pain.   OBJECTIVE:  Note: Objective measures were completed at Evaluation unless otherwise  noted.    POSTURE: rounded shoulders, forward head, decreased lumbar lordosis, increased thoracic kyphosis, flexed trunk , and Bilateral knee flexion (-15 degrees).  PALPATION: Very tender to palpation over right SIJ and proximal gluteal musculature.  LUMBAR ROM:   Lumbar flexion limited by 50% and extension to neutral.  LOWER EXTREMITY ROM:     Right hip IR limited to neutral.  LOWER EXTREMITY MMT:   The patient is able to provide a normal strength grade for bilateral LE's via manual muscle testing.    LUMBAR SPECIAL TESTS:  Equal leg lengths. (+) right SLR.  Pain with attempted right FABER test.   Patellar reflexes are 3+/4+ and Achilles are 2+/4+.  GAIT: The patient's gait is antalgic in nature and she walks in a flexed trunk posture with a Hurry cane.    TREATMENT DATE:  09/30/23:  Nustep level 4 x 15 minutes f/b STW/M x 8 minutes to patient's right SIJ/proximal glut region with patient in left sdly position and pillow between knees for comfort f/b HMP and IFC at 80-150 Hz on 40% scan x 20 minutes to patient's right LB/SIJ region.  She enjoyed treatment and felt better following with a normal modality response following removal of modality.                                                                                                                             09/29/23:     HMP and IFC at 80-150 Hz on 40% scan x 20 minutes to patient's right LB/SIJ region.  She enjoyed treatment and felt better following with a normal modality response following removal of modality.  PATIENT EDUCATION:  Education details: Sleeping posture with pillows between knees. Person educated: Patient Education method: Explanation Education comprehension: verbalized understanding  HOME EXERCISE PROGRAM:   ASSESSMENT:  CLINICAL IMPRESSION: The patient was sore after  her first treatment.  She did well today with no pain increase on Nustep.  She felt better after treatment.    OBJECTIVE IMPAIRMENTS: decreased activity tolerance, decreased ROM, increased muscle spasms, postural dysfunction, and pain.   ACTIVITY LIMITATIONS: carrying, lifting, bending, standing, bed mobility, and locomotion level  PARTICIPATION LIMITATIONS: meal prep, cleaning, laundry, and community activity  PERSONAL FACTORS: 1 comorbidity: h/o LBP  are also affecting patient's functional outcome.   REHAB POTENTIAL: Good  CLINICAL DECISION MAKING: Evolving/moderate complexity  EVALUATION COMPLEXITY: Low   GOALS: LONG TERM GOALS: Target date: 11/09/13  Ind with a HEP.  Goal status: INITIAL  2.  Perform ADL's with pain not > 2-3/10.  Goal status: INITIAL  3.  Eliminate right buttock pain.  Goal status: INITIAL  4.  Walk in upright posture with pain not > 3/10.  Goal status: INITIAL  PLAN:  PT FREQUENCY: 2x/week  PT DURATION: 6 weeks  PLANNED INTERVENTIONS: 97110-Therapeutic exercises, 97530- Therapeutic activity, [id number]- Neuromuscular re-education, 97535- Self Care, 16109- Manual therapy, 97014- Electrical stimulation (unattended), 97035- Ultrasound, Patient/Family education, Dry Needling, Cryotherapy, and Moist heat.  PLAN FOR NEXT SESSION: Combo e'stim/US at 1.50 W/CM2, STW/M, Core exercise progression, spinal protection techniques and body mechanics training.   Henna Derderian, [place], PT 09/30/2023, 9:14 AM

## 2023-10-02 DIAGNOSIS — M170 Bilateral primary osteoarthritis of knee: Secondary | ICD-10-CM | POA: Diagnosis not present

## 2023-10-02 DIAGNOSIS — L281 Prurigo nodularis: Secondary | ICD-10-CM | POA: Diagnosis not present

## 2023-10-02 DIAGNOSIS — H61032 Chondritis of left external ear: Secondary | ICD-10-CM | POA: Diagnosis not present

## 2023-10-02 DIAGNOSIS — R208 Other disturbances of skin sensation: Secondary | ICD-10-CM | POA: Diagnosis not present

## 2023-10-02 DIAGNOSIS — L538 Other specified erythematous conditions: Secondary | ICD-10-CM | POA: Diagnosis not present

## 2023-10-02 DIAGNOSIS — L2989 Other pruritus: Secondary | ICD-10-CM | POA: Diagnosis not present

## 2023-10-05 ENCOUNTER — Ambulatory Visit: Payer: Medicare Other | Attending: Physical Medicine and Rehabilitation | Admitting: Physical Therapy

## 2023-10-05 DIAGNOSIS — M62830 Muscle spasm of back: Secondary | ICD-10-CM | POA: Insufficient documentation

## 2023-10-05 DIAGNOSIS — M5459 Other low back pain: Secondary | ICD-10-CM | POA: Diagnosis not present

## 2023-10-05 DIAGNOSIS — R293 Abnormal posture: Secondary | ICD-10-CM | POA: Insufficient documentation

## 2023-10-05 NOTE — Therapy (Addendum)
OUTPATIENT PHYSICAL THERAPY THORACOLUMBAR TREATMENT  Patient Name: Misty Smith MRN: [id number] DOB:Sep 05, 1940, 83 y.o., female Today's Date: 10/05/2023  END OF SESSION:  PT End of Session - 10/05/23 1013     Visit Number 3    Number of Visits 12    Date for PT Re-Evaluation 11/10/23    PT Start Time 0847    PT Stop Time 0940    PT Time Calculation (min) 53 min    Behavior During Therapy [facility] for tasks assessed/performed             Past Medical History:  Diagnosis Date   Allergy    Anxiety    Arthritis    Bronchitis    Cataract    Colovaginal fistula    Depression    Diabetes mellitus without complication (HCC)    Dysrhythmia    palpitations, occasional PVCs; Dr Jens Som cardiologist   Esophageal stricture    Fractured elbow 1970's   GERD (gastroesophageal reflux disease)    H/O hiatal hernia    Hyperlipidemia    Hypertension    Osteopenia    Perimenopausal vasomotor symptoms    Postmenopausal HRT (hormone replacement therapy)    Pre-diabetes    Stress    Subacute lupus erythematosus    Dr. Terri Piedra   Past Surgical History:  Procedure Laterality Date   abdominal bypass  1987   fibroids    ABDOMINAL HYSTERECTOMY     partial   bilateral tubal ligtation  1972   CHOLECYSTECTOMY N/A 03/21/2014   Procedure: LAPAROSCOPIC CHOLECYSTECTOMY WITH INTRAOPERATIVE CHOLANGIOGRAM;  Surgeon: Kandis Cocking, MD;  Location: [facility] OR;  Service: General;  Laterality: N/A;   COLONOSCOPY     elbow Right 1976   surgery after fracture of elbow   EYE SURGERY Bilateral    cataracts   ROTATOR CUFF REPAIR Right 03/29/2018   TONSILLECTOMY     TUBAL LIGATION     Patient Active Problem List   Diagnosis Date Noted   Sensorineural hearing loss, 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   Osteoarthritis of knee 09/11/2021   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   Upper airway cough syndrome 02/08/2021   Type 2 diabetes mellitus with other specified complication (HCC) 01/30/2021   Lumbar radiculopathy  01/11/2021   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   Impingement syndrome of shoulder region 09/26/2017   Vitamin D deficiency 03/18/2016   CAD (coronary artery disease) 12/30/2013   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   Osteopenia of the elderly 10/26/2013   Generalized anxiety disorder 09/21/2013   GERD (gastroesophageal reflux disease) 09/21/2013   Multiple pulmonary 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  SUBJECTIVE:  SUBJECTIVE STATEMENT: Less soreness today.  Getting better. PERTINENT HISTORY:  H/o LBP.  PAIN:  Are you having pain? Yes: NPRS scale: 7/10. Pain location: Righ LB/SIJ Pain description: Sharp. Aggravating factors: As above. Relieving factors: As above.    PRECAUTIONS: None  RED FLAGS: None   WEIGHT BEARING RESTRICTIONS: No  FALLS:  Has patient fallen in last 6 months? No  LIVING ENVIRONMENT: Lives with: lives with their family Lives in: House/apartment Has following equipment at home:  Patient uses a Hurry cane.  OCCUPATION: Retired.  PLOF: Independent with basic ADLs and Independent with household mobility with device  PATIENT GOALS: Get back to where she was before the onset of this pain.   OBJECTIVE:  Note: Objective measures were completed at Evaluation unless otherwise noted.    POSTURE: rounded shoulders, forward head, decreased lumbar lordosis, increased thoracic kyphosis, flexed trunk , and Bilateral  knee flexion (-15 degrees).  PALPATION: Very tender to palpation over right SIJ and proximal gluteal musculature.  LUMBAR ROM:   Lumbar flexion limited by 50% and extension to neutral.  LOWER EXTREMITY ROM:     Right hip IR limited to neutral.  LOWER EXTREMITY MMT:   The patient is able to provide a normal strength grade for bilateral LE's via manual muscle testing.    LUMBAR SPECIAL TESTS:  Equal leg lengths. (+) right SLR.  Pain with attempted right FABER test.   Patellar reflexes are 3+/4+ and Achilles are 2+/4+.  GAIT: The patient's gait is antalgic in nature and she walks in a flexed trunk posture with a Hurry cane.    TREATMENT DATE:  10/05/23:                                     EXERCISE LOG  Exercise Repetitions and Resistance Comments  Nustep Level 3 x 15 minutes   Supine ball squeezes 4 minutes   Single knee to chest both sides 1 minute hold each side.   Double knee to chest 1 minute hold.   Hip bridges To fatigue     f/b HMP and IFC at 80-150 Hz on 40% scan x 20 minutes to patient's right LB/SIJ region.  She enjoyed treatment and felt better following with a normal modality response following removal of modality.                                                                                                                          09/30/23:  Nustep level 4 x 15 minutes f/b STW/M x 8 minutes to patient's right SIJ/proximal glut region with patient in left sdly position and pillow between knees for comfort f/b HMP and IFC at 80-150 Hz on 40% scan x 20 minutes to patient's right LB/SIJ region.  She enjoyed treatment and felt better following with a normal modality response following removal of modality.  09/29/23:     HMP and IFC at 80-150 Hz on 40% scan x 20 minutes to patient's right LB/SIJ region.  She enjoyed treatment and felt better following  with a normal modality response following removal of modality.                                                                                                                                PATIENT EDUCATION:  Education details: See below. Person educated: Patient Education method: Explanation Education comprehension: verbalized understanding, handout.  HOME EXERCISE PROGRAM:  HOME EXERCISE PROGRAM Created by [place] Edgardo Petrenko Feb 3rd, 2025 View at [email or web address] using code: [id number] Total 4 Page 1 of 2 Adductor Squeeze MET Lying on your back with knees bent, place a ball or folded pillow between your knees. Squeeze your knees together with moderate force and hold for an 8 count. Repeat 4 Times Hold 5 Seconds Perform 2 Times  Bridges While laying on your back, contract the low abdominals and lift from the hips. Repeat 15 Times Hold 2 Seconds Complete 2 Sets Perform 2 Times a Day SINGLE KNEE TO CHEST STRETCH - SKTC While Lying on your back, hold your knee and gently pull it up towards your chest. Repeat 3 Times Hold 30 Seconds Complete 1 Set Perform 3 Times a Day DOUBLE KNEE TO CHEST STRETCH - DKTC While Lying on your back, hold your knees and gently pull them up towards your chest. Repeat 2 Times Hold 1 Minute Complete 1 Set Perform 3 Times a Day   ASSESSMENT:  CLINICAL IMPRESSION: Patient feeling some better today with less soreness.  She did well with therex today and was provided with a handout for an HEP.  OBJECTIVE IMPAIRMENTS: decreased activity tolerance, decreased ROM, increased muscle spasms, postural dysfunction, and pain.   ACTIVITY LIMITATIONS: carrying, lifting, bending, standing, bed mobility, and locomotion level  PARTICIPATION LIMITATIONS: meal prep, cleaning, laundry, and community activity  PERSONAL FACTORS: 1 comorbidity: h/o LBP  are also affecting patient's functional outcome.   REHAB POTENTIAL: Good  CLINICAL DECISION MAKING:  Evolving/moderate complexity  EVALUATION COMPLEXITY: Low   GOALS: LONG TERM GOALS: Target date: 11/09/13  Ind with a HEP.  Goal status: INITIAL  2.  Perform ADL's with pain not > 2-3/10.  Goal status: INITIAL  3.  Eliminate right buttock pain.  Goal status: INITIAL  4.  Walk in upright posture with pain not > 3/10.  Goal status: INITIAL  PLAN:  PT FREQUENCY: 2x/week  PT DURATION: 6 weeks  PLANNED INTERVENTIONS: 97110-Therapeutic exercises, 97530- Therapeutic activity, [id number]- Neuromuscular re-education, 97535- Self Care, 96045- Manual therapy, 97014- Electrical stimulation (unattended), 97035- Ultrasound, Patient/Family education, Dry Needling, Cryotherapy, and Moist heat.  PLAN FOR NEXT SESSION: Combo e'stim/US at 1.50 W/CM2, STW/M, Core exercise progression, spinal protection techniques and body mechanics training.   Laylani Pudwill, [place], PT 10/05/2023, 10:16 AM

## 2023-10-07 ENCOUNTER — Ambulatory Visit: Payer: Medicare Other | Admitting: Physical Therapy

## 2023-10-07 DIAGNOSIS — M62830 Muscle spasm of back: Secondary | ICD-10-CM | POA: Diagnosis not present

## 2023-10-07 DIAGNOSIS — M5459 Other low back pain: Secondary | ICD-10-CM | POA: Diagnosis not present

## 2023-10-07 DIAGNOSIS — R293 Abnormal posture: Secondary | ICD-10-CM

## 2023-10-07 NOTE — Therapy (Signed)
 OUTPATIENT PHYSICAL THERAPY THORACOLUMBAR TREATMENT  Patient Name: Misty Smith MRN: [id number] DOB:1941/03/30, 83 y.o., female Today's Date: 10/07/2023  END OF SESSION:  PT End of Session - 10/07/23 0922     Visit Number 4    Number of Visits 12    Date for PT Re-Evaluation 11/10/23    PT Start Time 0847    Activity Tolerance Patient tolerated treatment well    Behavior During Therapy [facility] for tasks assessed/performed              Past Medical History:  Diagnosis Date   Allergy    Anxiety    Arthritis    Bronchitis    Cataract    Colovaginal fistula    Depression    Diabetes mellitus without complication (HCC)    Dysrhythmia    palpitations, occasional PVCs; Dr Pietro cardiologist   Esophageal stricture    Fractured elbow 1970's   GERD (gastroesophageal reflux disease)    H/O hiatal hernia    Hyperlipidemia    Hypertension    Osteopenia    Perimenopausal vasomotor symptoms    Postmenopausal HRT (hormone replacement therapy)    Pre-diabetes    Stress    Subacute lupus erythematosus    Dr. ivin   Past Surgical History:  Procedure Laterality Date   abdominal bypass  1987   fibroids    ABDOMINAL HYSTERECTOMY     partial   bilateral tubal ligtation  1972   CHOLECYSTECTOMY N/A 03/21/2014   Procedure: LAPAROSCOPIC CHOLECYSTECTOMY WITH INTRAOPERATIVE CHOLANGIOGRAM;  Surgeon: Alm VEAR Angle, MD;  Location: [facility] OR;  Service: General;  Laterality: N/A;   COLONOSCOPY     elbow Right 1976   surgery after fracture of elbow   EYE SURGERY Bilateral    cataracts   ROTATOR CUFF REPAIR Right 03/29/2018   TONSILLECTOMY     TUBAL LIGATION     Patient Active Problem List   Diagnosis Date Noted   Sensorineural hearing loss, 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   Osteoarthritis of knee 09/11/2021   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   Upper airway cough syndrome 02/08/2021   Type 2 diabetes mellitus with other specified complication (HCC) 01/30/2021   Lumbar radiculopathy  01/11/2021   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   Impingement syndrome of shoulder region 09/26/2017   Vitamin D  deficiency 03/18/2016   CAD (coronary artery disease) 12/30/2013   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   Osteopenia of the elderly 10/26/2013   Generalized anxiety disorder 09/21/2013   GERD (gastroesophageal reflux disease) 09/21/2013   Multiple pulmonary nodules determined by computed tomography of lung 09/21/2013   Hyperlipidemia 02/01/2009   Essential hypertension 02/01/2009    REFERRING PROVIDER: Charlie Dolores MD  REFERRING DIAG: Lumbar radiculopathy  Rationale for Evaluation and Treatment: Rehabilitation  THERAPY DIAG:  No diagnosis found.  ONSET DATE: Mid-December.  SUBJECTIVE:  SUBJECTIVE STATEMENT: Pain down to a 5.  PERTINENT HISTORY:  H/o LBP.  PAIN:  Are you having pain? Yes: NPRS scale: 5/10. Pain location: Righ LB/SIJ Pain description: Sharp. Aggravating factors: As above. Relieving factors: As above.    PRECAUTIONS: None  RED FLAGS: None   WEIGHT BEARING RESTRICTIONS: No  FALLS:  Has patient fallen in last 6 months? No  LIVING ENVIRONMENT: Lives with: lives with their family Lives in: House/apartment Has following equipment at home:  Patient uses a Hurry cane.  OCCUPATION: Retired.  PLOF: Independent with basic ADLs and Independent with household mobility with device  PATIENT GOALS: Get back to where she was before the onset of this pain.   OBJECTIVE:  Note: Objective measures were completed at Evaluation unless otherwise noted.    POSTURE: rounded shoulders, forward head, decreased lumbar lordosis, increased thoracic kyphosis, flexed trunk , and Bilateral knee flexion (-15 degrees).  PALPATION: Very tender to  palpation over right SIJ and proximal gluteal musculature.  LUMBAR ROM:   Lumbar flexion limited by 50% and extension to neutral.  LOWER EXTREMITY ROM:     Right hip IR limited to neutral.  LOWER EXTREMITY MMT:   The patient is able to provide a normal strength grade for bilateral LE's via manual muscle testing.    LUMBAR SPECIAL TESTS:  Equal leg lengths. (+) right SLR.  Pain with attempted right FABER test.   Patellar reflexes are 3+/4+ and Achilles are 2+/4+.  GAIT: The patient's gait is antalgic in nature and she walks in a flexed trunk posture with a Hurry cane.    TREATMENT DATE:  10/07/23:    Nustep level 4 x 15 minutes  f/b gentle right femoral traction x 3 minutes f/b STW/M x 5 minutes to patient's right SIJ/proximal gluteal musculature with patient in left sdly position with folded pillow pillow between knees for comfort f/b HMP and IFC at 80-150 Hz on 40% scan x 20 minutes.     10/05/23:                                     EXERCISE LOG  Exercise Repetitions and Resistance Comments  Nustep Level 3 x 15 minutes   Supine ball squeezes 4 minutes   Single knee to chest both sides 1 minute hold each side.   Double knee to chest 1 minute hold.   Hip bridges To fatigue     f/b HMP and IFC at 80-150 Hz on 40% scan x 20 minutes to patient's right LB/SIJ region.  She enjoyed treatment and felt better following with a normal modality response following removal of modality.                                                                                                                          09/30/23:  Nustep level 4 x 15 minutes f/b STW/M x 8 minutes to  patient's right SIJ/proximal glut region with patient in left sdly position and pillow between knees for comfort f/b HMP and IFC at 80-150 Hz on 40% scan x 20 minutes to patient's right LB/SIJ region.  She enjoyed treatment and felt better following with a normal modality response following removal of modality.                                                                                                                                                                                                                                                   PATIENT EDUCATION:  Education details: See below. Person educated: Patient Education method: Explanation Education comprehension: verbalized understanding, handout.  HOME EXERCISE PROGRAM:  HOME EXERCISE PROGRAM Created by Chantea Surace Feb 3rd, 2025 View at [email or web address] using code: [email or web address] Total 4 Page 1 of 2 Adductor Squeeze MET Lying on your back with knees bent, place a ball or folded pillow between your knees. Squeeze your knees together with moderate force and hold for an 8 count. Repeat 4 Times Hold 5 Seconds Perform 2 Times  Bridges While laying on your back, contract the low abdominals and lift from the hips. Repeat 15 Times Hold 2 Seconds Complete 2 Sets Perform 2 Times a Day SINGLE KNEE TO CHEST STRETCH - SKTC While Lying on your back, hold your knee and gently pull it up towards your chest. Repeat 3 Times Hold 30 Seconds Complete 1 Set Perform 3 Times a Day DOUBLE KNEE TO CHEST STRETCH - DKTC While Lying on your back, hold your knees and gently pull them up towards your chest. Repeat 2 Times Hold 1 Minute Complete 1 Set Perform 3 Times a Day   ASSESSMENT:  CLINICAL IMPRESSION: Patient did very well with gentle femoral traction today.  She had a notable trigger point in her right proximal gluteal musculature with a good releases with STW/M that included ischemic release technique. Patient stated feeling better after treatment.  OBJECTIVE IMPAIRMENTS: decreased activity tolerance, decreased ROM, increased muscle spasms, postural dysfunction, and pain.   ACTIVITY LIMITATIONS: carrying, lifting, bending, standing, bed mobility, and locomotion level  PARTICIPATION LIMITATIONS: meal prep, cleaning, laundry, and community  activity  PERSONAL FACTORS: 1 comorbidity: h/o LBP  are also affecting patient's functional outcome.   REHAB POTENTIAL: Good  CLINICAL DECISION MAKING: Evolving/moderate complexity  EVALUATION COMPLEXITY: Low   GOALS:  LONG TERM GOALS: Target date: 11/09/13  Ind with a HEP.  Goal status: INITIAL  2.  Perform ADL's with pain not > 2-3/10.  Goal status: INITIAL  3.  Eliminate right buttock pain.  Goal status: INITIAL  4.  Walk in upright posture with pain not > 3/10.  Goal status: INITIAL  PLAN:  PT FREQUENCY: 2x/week  PT DURATION: 6 weeks  PLANNED INTERVENTIONS: 97110-Therapeutic exercises, 97530- Therapeutic activity, [id number]- Neuromuscular re-education, 97535- Self Care, [id number]- Manual therapy, 97014- Electrical stimulation (unattended), 97035- Ultrasound, Patient/Family education, Dry Needling, Cryotherapy, and Moist heat.  PLAN FOR NEXT SESSION: Combo e'stim/US  at 1.50 W/CM2, STW/M, Core exercise progression, spinal protection techniques and body mechanics training.   Pinchas Reither, PT 10/07/2023, 9:23 AM

## 2023-10-09 DIAGNOSIS — M170 Bilateral primary osteoarthritis of knee: Secondary | ICD-10-CM | POA: Diagnosis not present

## 2023-10-12 ENCOUNTER — Ambulatory Visit: Payer: Medicare Other | Admitting: Physical Therapy

## 2023-10-12 DIAGNOSIS — M62830 Muscle spasm of back: Secondary | ICD-10-CM

## 2023-10-12 DIAGNOSIS — M5459 Other low back pain: Secondary | ICD-10-CM

## 2023-10-12 DIAGNOSIS — R293 Abnormal posture: Secondary | ICD-10-CM | POA: Diagnosis not present

## 2023-10-12 NOTE — Therapy (Signed)
 OUTPATIENT PHYSICAL THERAPY THORACOLUMBAR TREATMENT  Patient Name: Misty Smith MRN: [id number] DOB:[birth year], 83 y.o., female Today's Date: 10/12/2023  END OF SESSION:  PT End of Session - 10/12/23 0850     Visit Number 5    Number of Visits 12    Date for PT Re-Evaluation 11/10/23    PT Start Time 0845    PT Stop Time 0936    PT Time Calculation (min) 51 min    Activity Tolerance Patient tolerated treatment well    Behavior During Therapy [facility] for tasks assessed/performed              Past Medical History:  Diagnosis Date   Allergy    Anxiety    Arthritis    Bronchitis    Cataract    Colovaginal fistula    Depression    Diabetes mellitus without complication (HCC)    Dysrhythmia    palpitations, occasional PVCs; Dr Audery Blazing cardiologist   Esophageal stricture    Fractured elbow 1970's   GERD (gastroesophageal reflux disease)    H/O hiatal hernia    Hyperlipidemia    Hypertension    Osteopenia    Perimenopausal vasomotor symptoms    Postmenopausal HRT (hormone replacement therapy)    Pre-diabetes    Stress    Subacute lupus erythematosus    Dr. Fleurette Humbles   Past Surgical History:  Procedure Laterality Date   abdominal bypass  1987   fibroids    ABDOMINAL HYSTERECTOMY     partial   bilateral tubal ligtation  1972   CHOLECYSTECTOMY N/A 03/21/2014   Procedure: LAPAROSCOPIC CHOLECYSTECTOMY WITH INTRAOPERATIVE CHOLANGIOGRAM;  Surgeon: Thayne Fine, MD;  Location: [facility] OR;  Service: General;  Laterality: N/A;   COLONOSCOPY     elbow Right 1976   surgery after fracture of elbow   EYE SURGERY Bilateral    cataracts   ROTATOR CUFF REPAIR Right 03/29/2018   TONSILLECTOMY     TUBAL LIGATION     Patient Active Problem List   Diagnosis Date Noted   Sensorineural hearing loss, 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   Osteoarthritis of knee 09/11/2021   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   Upper airway cough syndrome 02/08/2021   Type 2 diabetes mellitus with other  specified complication (HCC) 01/30/2021   Lumbar radiculopathy 01/11/2021   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   Impingement syndrome of shoulder region 09/26/2017   Vitamin D  deficiency 03/18/2016   CAD (coronary artery disease) 12/30/2013   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   Osteopenia of the elderly 10/26/2013   Generalized anxiety disorder 09/21/2013   GERD (gastroesophageal reflux disease) 09/21/2013   Multiple pulmonary nodules determined by computed tomography of lung 09/21/2013   Hyperlipidemia 02/01/2009   Essential hypertension 02/01/2009    REFERRING PROVIDER: Adelaide Adjutant MD  REFERRING DIAG: Lumbar radiculopathy  Rationale for Evaluation and Treatment: Rehabilitation  THERAPY DIAG:  Other low back pain  Abnormal posture  Muscle spasm of back  ONSET DATE: Mid-December.  SUBJECTIVE:  SUBJECTIVE STATEMENT: Last treatment helped, just sore. PERTINENT HISTORY:  H/o LBP.  PAIN:  Are you having pain? Yes: NPRS scale: 5/10. Pain location: Righ LB/SIJ Pain description: Sharp. Aggravating factors: As above. Relieving factors: As above.    PRECAUTIONS: None  RED FLAGS: None   WEIGHT BEARING RESTRICTIONS: No  FALLS:  Has patient fallen in last 6 months? No  LIVING ENVIRONMENT: Lives with: lives with their family Lives in: House/apartment Has following equipment at home:  Patient uses a Hurry cane.  OCCUPATION: Retired.  PLOF: Independent with basic ADLs and Independent with household mobility with device  PATIENT GOALS: Get back to where she was before the onset of this pain.   OBJECTIVE:  Note: Objective measures were completed at Evaluation unless otherwise noted.    POSTURE: rounded shoulders, forward head, decreased lumbar lordosis,  increased thoracic kyphosis, flexed trunk , and Bilateral knee flexion (-15 degrees).  PALPATION: Very tender to palpation over right SIJ and proximal gluteal musculature.  LUMBAR ROM:   Lumbar flexion limited by 50% and extension to neutral.  LOWER EXTREMITY ROM:     Right hip IR limited to neutral.  LOWER EXTREMITY MMT:   The patient is able to provide a normal strength grade for bilateral LE's via manual muscle testing.    LUMBAR SPECIAL TESTS:  Equal leg lengths. (+) right SLR.  Pain with attempted right FABER test.   Patellar reflexes are 3+/4+ and Achilles are 2+/4+.  GAIT: The patient's gait is antalgic in nature and she walks in a flexed trunk posture with a Hurry cane.    TREATMENT DATE:  10/12/23:  Nustep level 3 x 15 minutes f/b gentle sustained right femoral traction and oscillations (8 minutes) f/b  HMP and IFC at 80-150 Hz on 40% scan x 20 minutes.    10/07/23:    Nustep level 4 x 15 minutes  f/b gentle right femoral traction x 3 minutes f/b STW/M x 5 minutes to patient's right SIJ/proximal gluteal musculature with patient in left sdly position with folded pillow pillow between knees for comfort f/b HMP and IFC at 80-150 Hz on 40% scan x 20 minutes.                                                                                                                                PATIENT EDUCATION:  Education details: See below. Person educated: Patient Education method: Explanation Education comprehension: verbalized understanding, handout.  HOME EXERCISE PROGRAM:  HOME EXERCISE PROGRAM Created by [place] Lovis More Feb 3rd, 2025 View at [email or web address] using code: [email or web address] Total 4 Page 1 of 2 Adductor Squeeze MET Lying on your back with knees bent, place a ball or folded pillow between your knees. Squeeze your knees together with moderate force and hold for an 8 count. Repeat 4 Times Hold 5 Seconds Perform 2 Times  Bridges While laying on your back, contract  the low abdominals and lift from the hips. Repeat  15 Times Hold 2 Seconds Complete 2 Sets Perform 2 Times a Day SINGLE KNEE TO CHEST STRETCH - SKTC While Lying on your back, hold your knee and gently pull it up towards your chest. Repeat 3 Times Hold 30 Seconds Complete 1 Set Perform 3 Times a Day DOUBLE KNEE TO CHEST STRETCH - DKTC While Lying on your back, hold your knees and gently pull them up towards your chest. Repeat 2 Times Hold 1 Minute Complete 1 Set Perform 3 Times a Day   ASSESSMENT:  CLINICAL IMPRESSION: The patient is pleased with her progress.  She felt good after last treatment.  She did very well with gentle right femoral traction and oscillations today.  She felt better after treatment.    OBJECTIVE IMPAIRMENTS: decreased activity tolerance, decreased ROM, increased muscle spasms, postural dysfunction, and pain.   ACTIVITY LIMITATIONS: carrying, lifting, bending, standing, bed mobility, and locomotion level  PARTICIPATION LIMITATIONS: meal prep, cleaning, laundry, and community activity  PERSONAL FACTORS: 1 comorbidity: h/o LBP  are also affecting patient's functional outcome.   REHAB POTENTIAL: Good  CLINICAL DECISION MAKING: Evolving/moderate complexity  EVALUATION COMPLEXITY: Low   GOALS: LONG TERM GOALS: Target date: 11/09/13  Ind with a HEP.  Goal status: INITIAL  2.  Perform ADL's with pain not > 2-3/10.  Goal status: INITIAL  3.  Eliminate right buttock pain.  Goal status: INITIAL  4.  Walk in upright posture with pain not > 3/10.  Goal status: INITIAL  PLAN:  PT FREQUENCY: 2x/week  PT DURATION: 6 weeks  PLANNED INTERVENTIONS: 97110-Therapeutic exercises, 97530- Therapeutic activity, [id number]- Neuromuscular re-education, 97535- Self Care, 25956- Manual therapy, 97014- Electrical stimulation (unattended), 97035- Ultrasound, Patient/Family education, Dry Needling, Cryotherapy, and Moist heat.  PLAN FOR NEXT SESSION: Combo e'stim/US  at 1.50  W/CM2, STW/M, Core exercise progression, spinal protection techniques and body mechanics training.   Shameria Trimarco, [place], PT 10/12/2023, 9:43 AM

## 2023-10-14 ENCOUNTER — Encounter: Payer: Medicare Other | Admitting: Physical Therapy

## 2023-10-16 DIAGNOSIS — M170 Bilateral primary osteoarthritis of knee: Secondary | ICD-10-CM | POA: Diagnosis not present

## 2023-10-19 ENCOUNTER — Ambulatory Visit: Payer: Medicare Other | Admitting: Physical Therapy

## 2023-10-19 DIAGNOSIS — M5459 Other low back pain: Secondary | ICD-10-CM | POA: Diagnosis not present

## 2023-10-19 DIAGNOSIS — M62830 Muscle spasm of back: Secondary | ICD-10-CM

## 2023-10-19 DIAGNOSIS — R293 Abnormal posture: Secondary | ICD-10-CM

## 2023-10-19 NOTE — Therapy (Addendum)
 OUTPATIENT PHYSICAL THERAPY THORACOLUMBAR TREATMENT  Patient Name: Misty Smith MRN: [id number] DOB:1940/09/25, 83 y.o., female Today's Date: 10/19/2023  END OF SESSION:  PT End of Session - 10/19/23 1305     Visit Number 6    Date for PT Re-Evaluation 11/10/23    PT Start Time 0100    PT Stop Time 0155    PT Time Calculation (min) 55 min    Behavior During Therapy [facility] for tasks assessed/performed              Past Medical History:  Diagnosis Date   Allergy    Anxiety    Arthritis    Bronchitis    Cataract    Colovaginal fistula    Depression    Diabetes mellitus without complication (HCC)    Dysrhythmia    palpitations, occasional PVCs; Dr Jens Som cardiologist   Esophageal stricture    Fractured elbow 1970's   GERD (gastroesophageal reflux disease)    H/O hiatal hernia    Hyperlipidemia    Hypertension    Osteopenia    Perimenopausal vasomotor symptoms    Postmenopausal HRT (hormone replacement therapy)    Pre-diabetes    Stress    Subacute lupus erythematosus    Dr. Terri Piedra   Past Surgical History:  Procedure Laterality Date   abdominal bypass  1987   fibroids    ABDOMINAL HYSTERECTOMY     partial   bilateral tubal ligtation  1972   CHOLECYSTECTOMY N/A 03/21/2014   Procedure: LAPAROSCOPIC CHOLECYSTECTOMY WITH INTRAOPERATIVE CHOLANGIOGRAM;  Surgeon: Kandis Cocking, MD;  Location: [facility] OR;  Service: General;  Laterality: N/A;   COLONOSCOPY     elbow Right 1976   surgery after fracture of elbow   EYE SURGERY Bilateral    cataracts   ROTATOR CUFF REPAIR Right 03/29/2018   TONSILLECTOMY     TUBAL LIGATION     Patient Active Problem List   Diagnosis Date Noted   Sensorineural hearing loss, 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   Osteoarthritis of knee 09/11/2021   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   Upper airway cough syndrome 02/08/2021   Type 2 diabetes mellitus with other specified complication (HCC) 01/30/2021   Lumbar radiculopathy 01/11/2021    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   Impingement syndrome of shoulder region 09/26/2017   Vitamin D deficiency 03/18/2016   CAD (coronary artery disease) 12/30/2013   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   Osteopenia of the elderly 10/26/2013   Generalized anxiety disorder 09/21/2013   GERD (gastroesophageal reflux disease) 09/21/2013   Multiple pulmonary 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  SUBJECTIVE:  SUBJECTIVE STATEMENT: Did good after last treatment.  Turned in shower this morning and hurt back.  Pain at a 5.    PERTINENT HISTORY:  H/o LBP.  PAIN:  Are you having pain? Yes: NPRS scale: 5/10. Pain location: Righ LB/SIJ Pain description: Sharp. Aggravating factors: As above. Relieving factors: As above.    PRECAUTIONS: None  RED FLAGS: None   WEIGHT BEARING RESTRICTIONS: No  FALLS:  Has patient fallen in last 6 months? No  LIVING ENVIRONMENT: Lives with: lives with their family Lives in: House/apartment Has following equipment at home:  Patient uses a Hurry cane.  OCCUPATION: Retired.  PLOF: Independent with basic ADLs and Independent with household mobility with device  PATIENT GOALS: Get back to where she was before the onset of this pain.   OBJECTIVE:  Note: Objective measures were completed at Evaluation unless otherwise noted.    POSTURE: rounded shoulders, forward head, decreased lumbar lordosis, increased thoracic  kyphosis, flexed trunk , and Bilateral knee flexion (-15 degrees).  PALPATION: Very tender to palpation over right SIJ and proximal gluteal musculature.  LUMBAR ROM:   Lumbar flexion limited by 50% and extension to neutral.  LOWER EXTREMITY ROM:     Right hip IR limited to neutral.  LOWER EXTREMITY MMT:   The patient is able to provide a normal strength grade for bilateral LE's via manual muscle testing.    LUMBAR SPECIAL TESTS:  Equal leg lengths. (+) right SLR.  Pain with attempted right FABER test.   Patellar reflexes are 3+/4+ and Achilles are 2+/4+.  GAIT: The patient's gait is antalgic in nature and she walks in a flexed trunk posture with a Hurry cane.    TREATMENT DATE:  10/18/22:  Nustep level 3 x 8 minutes f/b Combo e'stim/US at 1.50 W/CM2 x 7 minutes f/b STW/M to right low back/SIJ region f/b  HMP and IFC at 80-150 Hz on 40% scan x 20 minutes.     10/12/23:  Nustep level 3 x 15 minutes f/b gentle sustained right femoral traction and oscillations (8 minutes) f/b  HMP and IFC at 80-150 Hz on 40% scan x 20 minutes.    10/07/23:    Nustep level 4 x 15 minutes  f/b gentle right femoral traction x 3 minutes f/b STW/M x 5 minutes to patient's right SIJ/proximal gluteal musculature with patient in left sdly position with folded pillow pillow between knees for comfort f/b HMP and IFC at 80-150 Hz on 40% scan x 20 minutes.                                                                                                                                PATIENT EDUCATION:  Education details: See below. Person educated: Patient Education method: Explanation Education comprehension: verbalized understanding, handout.  HOME EXERCISE PROGRAM:  HOME EXERCISE PROGRAM Created by [place] Brie Eppard Feb 3rd, 2025 View at [email or web address] using code: [email or web address] Total 4 Page 1 of 2  Adductor Squeeze MET Lying on your back with knees bent, place a ball or folded pillow between your knees.  Squeeze your knees together with moderate force and hold for an 8 count. Repeat 4 Times Hold 5 Seconds Perform 2 Times  Bridges While laying on your back, contract the low abdominals and lift from the hips. Repeat 15 Times Hold 2 Seconds Complete 2 Sets Perform 2 Times a Day SINGLE KNEE TO CHEST STRETCH - SKTC While Lying on your back, hold your knee and gently pull it up towards your chest. Repeat 3 Times Hold 30 Seconds Complete 1 Set Perform 3 Times a Day DOUBLE KNEE TO CHEST STRETCH - DKTC While Lying on your back, hold your knees and gently pull them up towards your chest. Repeat 2 Times Hold 1 Minute Complete 1 Set Perform 3 Times a Day   ASSESSMENT:  CLINICAL IMPRESSION: Patient, overall, is feeling better.  She twisted in the shower today and increased her pain but states she is better since starting PT.  She felt good after treatment.  OBJECTIVE IMPAIRMENTS: decreased activity tolerance, decreased ROM, increased muscle spasms, postural dysfunction, and pain.   ACTIVITY LIMITATIONS: carrying, lifting, bending, standing, bed mobility, and locomotion level  PARTICIPATION LIMITATIONS: meal prep, cleaning, laundry, and community activity  PERSONAL FACTORS: 1 comorbidity: h/o LBP  are also affecting patient's functional outcome.   REHAB POTENTIAL: Good  CLINICAL DECISION MAKING: Evolving/moderate complexity  EVALUATION COMPLEXITY: Low   GOALS: LONG TERM GOALS: Target date: 11/09/13  Ind with a HEP.  Goal status: INITIAL  2.  Perform ADL's with pain not > 2-3/10.  Goal status: INITIAL  3.  Eliminate right buttock pain.  Goal status: INITIAL  4.  Walk in upright posture with pain not > 3/10.  Goal status: INITIAL  PLAN:  PT FREQUENCY: 2x/week  PT DURATION: 6 weeks  PLANNED INTERVENTIONS: 97110-Therapeutic exercises, 97530- Therapeutic activity, [id number]- Neuromuscular re-education, 97535- Self Care, [id number]- Manual therapy, 97014- Electrical stimulation  (unattended), 97035- Ultrasound, Patient/Family education, Dry Needling, Cryotherapy, and Moist heat.  PLAN FOR NEXT SESSION: Combo e'stim/US at 1.50 W/CM2, STW/M, Core exercise progression, spinal protection techniques and body mechanics training.   Jacquita Mulhearn, [place], PT 10/19/2023, 2:00 PM

## 2023-10-26 ENCOUNTER — Ambulatory Visit: Payer: Medicare Other | Admitting: Physical Therapy

## 2023-10-26 DIAGNOSIS — M62830 Muscle spasm of back: Secondary | ICD-10-CM | POA: Diagnosis not present

## 2023-10-26 DIAGNOSIS — M5459 Other low back pain: Secondary | ICD-10-CM

## 2023-10-26 DIAGNOSIS — R293 Abnormal posture: Secondary | ICD-10-CM

## 2023-10-26 NOTE — Therapy (Signed)
 OUTPATIENT PHYSICAL THERAPY THORACOLUMBAR TREATMENT  Patient Name: Misty Smith MRN: [id number] DOB:Feb 22, 1941, 83 y.o., female Today's Date: 10/26/2023  END OF SESSION:  PT End of Session - 10/26/23 1043     Visit Number 7    Number of Visits 12    Date for PT Re-Evaluation 11/10/23    PT Start Time 1015    PT Stop Time 1101    PT Time Calculation (min) 46 min    Activity Tolerance Patient tolerated treatment well    Behavior During Therapy WFL for tasks assessed/performed               Past Medical History:  Diagnosis Date   Allergy    Anxiety    Arthritis    Bronchitis    Cataract    Colovaginal fistula    Depression    Diabetes mellitus without complication (HCC)    Dysrhythmia    palpitations, occasional PVCs; Dr Jens Som cardiologist   Esophageal stricture    Fractured elbow 1970's   GERD (gastroesophageal reflux disease)    H/O hiatal hernia    Hyperlipidemia    Hypertension    Osteopenia    Perimenopausal vasomotor symptoms    Postmenopausal HRT (hormone replacement therapy)    Pre-diabetes    Stress    Subacute lupus erythematosus    Dr. Terri Piedra   Past Surgical History:  Procedure Laterality Date   abdominal bypass  1987   fibroids    ABDOMINAL HYSTERECTOMY     partial   bilateral tubal ligtation  1972   CHOLECYSTECTOMY N/A 03/21/2014   Procedure: LAPAROSCOPIC CHOLECYSTECTOMY WITH INTRAOPERATIVE CHOLANGIOGRAM;  Surgeon: Kandis Cocking, MD;  Location: [facility] OR;  Service: General;  Laterality: N/A;   COLONOSCOPY     elbow Right 1976   surgery after fracture of elbow   EYE SURGERY Bilateral    cataracts   ROTATOR CUFF REPAIR Right 03/29/2018   TONSILLECTOMY     TUBAL LIGATION     Patient Active Problem List   Diagnosis Date Noted   Sensorineural hearing loss, 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   Osteoarthritis of knee 09/11/2021   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   Upper airway cough syndrome 02/08/2021   Type 2 diabetes mellitus with other  specified complication (HCC) 01/30/2021   Lumbar radiculopathy 01/11/2021   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   Impingement syndrome of shoulder region 09/26/2017   Vitamin D deficiency 03/18/2016   CAD (coronary artery disease) 12/30/2013   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   Osteopenia of the elderly 10/26/2013   Generalized anxiety disorder 09/21/2013   GERD (gastroesophageal reflux disease) 09/21/2013   Multiple pulmonary 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  SUBJECTIVE:  SUBJECTIVE STATEMENT: Pian at about a 6.    PERTINENT HISTORY:  H/o LBP.  PAIN:  Are you having pain? Yes: NPRS scale: 6/10. Pain location: Righ LB/SIJ Pain description: Sharp. Aggravating factors: As above. Relieving factors: As above.    PRECAUTIONS: None  RED FLAGS: None   WEIGHT BEARING RESTRICTIONS: No  FALLS:  Has patient fallen in last 6 months? No  LIVING ENVIRONMENT: Lives with: lives with their family Lives in: House/apartment Has following equipment at home:  Patient uses a Hurry cane.  OCCUPATION: Retired.  PLOF: Independent with basic ADLs and Independent with household mobility with device  PATIENT GOALS: Get back to where she was before the onset of this pain.   OBJECTIVE:  Note: Objective measures were completed at Evaluation unless otherwise noted.    POSTURE: rounded shoulders, forward head, decreased lumbar lordosis, increased  thoracic kyphosis, flexed trunk , and Bilateral knee flexion (-15 degrees).  PALPATION: Very tender to palpation over right SIJ and proximal gluteal musculature.  LUMBAR ROM:   Lumbar flexion limited by 50% and extension to neutral.  LOWER EXTREMITY ROM:     Right hip IR limited to neutral.  LOWER EXTREMITY MMT:   The patient is able to provide a normal strength grade for bilateral LE's via manual muscle testing.    LUMBAR SPECIAL TESTS:  Equal leg lengths. (+) right SLR.  Pain with attempted right FABER test.   Patellar reflexes are 3+/4+ and Achilles are 2+/4+.  GAIT: The patient's gait is antalgic in nature and she walks in a flexed trunk posture with a Hurry cane.    TREATMENT DATE:  10/26/23:  Nustep level 4 x 15 minutes f/b 1-1 manual stretching into right SKTC, trunk rotation and right femoral traction sustained and with oscillations f/b HMP and IFC at 80-150 Hz on 40% scan x 20 minutes.                                                                                                                                   PATIENT EDUCATION:  Education details: See below. Person educated: Patient Education method: Explanation Education comprehension: verbalized understanding, handout.  HOME EXERCISE PROGRAM:  HOME EXERCISE PROGRAM Created by [place] Jalik Gellatly Feb 3rd, 2025 View at [email or web address] using code: [email or web address] Total 4 Page 1 of 2 Adductor Squeeze MET Lying on your back with knees bent, place a ball or folded pillow between your knees. Squeeze your knees together with moderate force and hold for an 8 count. Repeat 4 Times Hold 5 Seconds Perform 2 Times  Bridges While laying on your back, contract the low abdominals and lift from the hips. Repeat 15 Times Hold 2 Seconds Complete 2 Sets Perform 2 Times a Day SINGLE KNEE TO CHEST STRETCH - SKTC While Lying on your back, hold your knee and gently pull it up towards your chest. Repeat 3 Times Hold 30  Seconds Complete 1 Set Perform 3 Times  a Day DOUBLE KNEE TO CHEST STRETCH - DKTC While Lying on your back, hold your knees and gently pull them up towards your chest. Repeat 2 Times Hold 1 Minute Complete 1 Set Perform 3 Times a Day   ASSESSMENT:  CLINICAL IMPRESSION: Pain persists in her right LB/SIJ region.  Patient felt better and was walking better following treatment.   OBJECTIVE IMPAIRMENTS: decreased activity tolerance, decreased ROM, increased muscle spasms, postural dysfunction, and pain.   ACTIVITY LIMITATIONS: carrying, lifting, bending, standing, bed mobility, and locomotion level  PARTICIPATION LIMITATIONS: meal prep, cleaning, laundry, and community activity  PERSONAL FACTORS: 1 comorbidity: h/o LBP  are also affecting patient's functional outcome.   REHAB POTENTIAL: Good  CLINICAL DECISION MAKING: Evolving/moderate complexity  EVALUATION COMPLEXITY: Low   GOALS: LONG TERM GOALS: Target date: 11/09/13  Ind with a HEP.  Goal status: INITIAL  2.  Perform ADL's with pain not > 2-3/10.  Goal status: INITIAL  3.  Eliminate right buttock pain.  Goal status: INITIAL  4.  Walk in upright posture with pain not > 3/10.  Goal status: INITIAL  PLAN:  PT FREQUENCY: 2x/week  PT DURATION: 6 weeks  PLANNED INTERVENTIONS: 97110-Therapeutic exercises, 97530- Therapeutic activity, [id number]- Neuromuscular re-education, 97535- Self Care, [id number]- Manual therapy, 97014- Electrical stimulation (unattended), 97035- Ultrasound, Patient/Family education, Dry Needling, Cryotherapy, and Moist heat.  PLAN FOR NEXT SESSION: Combo e'stim/US at 1.50 W/CM2, STW/M, Core exercise progression, spinal protection techniques and body mechanics training.   Nichola Cieslinski, [place], PT 10/26/2023, 11:17 AM

## 2023-10-28 ENCOUNTER — Ambulatory Visit: Payer: Medicare Other | Admitting: Physical Therapy

## 2023-10-28 DIAGNOSIS — M5459 Other low back pain: Secondary | ICD-10-CM | POA: Diagnosis not present

## 2023-10-28 DIAGNOSIS — M62830 Muscle spasm of back: Secondary | ICD-10-CM | POA: Diagnosis not present

## 2023-10-28 DIAGNOSIS — R293 Abnormal posture: Secondary | ICD-10-CM | POA: Diagnosis not present

## 2023-10-28 NOTE — Therapy (Signed)
 OUTPATIENT PHYSICAL THERAPY THORACOLUMBAR TREATMENT  Patient Name: Misty Smith MRN: [id number] DOB:1941-05-06, 83 y.o., female Today's Date: 10/28/2023  END OF SESSION:  PT End of Session - 10/28/23 0904     Visit Number 8    Number of Visits 12    Date for PT Re-Evaluation 11/10/23    PT Start Time 0848    PT Stop Time 0941    PT Time Calculation (min) 53 min    Activity Tolerance Patient tolerated treatment well    Behavior During Therapy [facility] for tasks assessed/performed               Past Medical History:  Diagnosis Date   Allergy    Anxiety    Arthritis    Bronchitis    Cataract    Colovaginal fistula    Depression    Diabetes mellitus without complication (HCC)    Dysrhythmia    palpitations, occasional PVCs; Dr Jens Som cardiologist   Esophageal stricture    Fractured elbow 1970's   GERD (gastroesophageal reflux disease)    H/O hiatal hernia    Hyperlipidemia    Hypertension    Osteopenia    Perimenopausal vasomotor symptoms    Postmenopausal HRT (hormone replacement therapy)    Pre-diabetes    Stress    Subacute lupus erythematosus    Dr. Terri Piedra   Past Surgical History:  Procedure Laterality Date   abdominal bypass  1987   fibroids    ABDOMINAL HYSTERECTOMY     partial   bilateral tubal ligtation  1972   CHOLECYSTECTOMY N/A 03/21/2014   Procedure: LAPAROSCOPIC CHOLECYSTECTOMY WITH INTRAOPERATIVE CHOLANGIOGRAM;  Surgeon: Kandis Cocking, MD;  Location: [facility] OR;  Service: General;  Laterality: N/A;   COLONOSCOPY     elbow Right 1976   surgery after fracture of elbow   EYE SURGERY Bilateral    cataracts   ROTATOR CUFF REPAIR Right 03/29/2018   TONSILLECTOMY     TUBAL LIGATION     Patient Active Problem List   Diagnosis Date Noted   Sensorineural hearing loss, 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   Osteoarthritis of knee 09/11/2021   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   Upper airway cough syndrome 02/08/2021   Type 2 diabetes mellitus with other  specified complication (HCC) 01/30/2021   Lumbar radiculopathy 01/11/2021   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   Impingement syndrome of shoulder region 09/26/2017   Vitamin D deficiency 03/18/2016   CAD (coronary artery disease) 12/30/2013   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   Osteopenia of the elderly 10/26/2013   Generalized anxiety disorder 09/21/2013   GERD (gastroesophageal reflux disease) 09/21/2013   Multiple pulmonary 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  SUBJECTIVE:  SUBJECTIVE STATEMENT: Pain not too bad today.  PERTINENT HISTORY:  H/o LBP.  PAIN:  Are you having pain? Yes: NPRS scale: "Not too bad."/10. Pain location: Righ LB/SIJ Pain description: Sharp. Aggravating factors: As above. Relieving factors: As above.    PRECAUTIONS: None  RED FLAGS: None   WEIGHT BEARING RESTRICTIONS: No  FALLS:  Has patient fallen in last 6 months? No  LIVING ENVIRONMENT: Lives with: lives with their family Lives in: House/apartment Has following equipment at home:  Patient uses a Hurry cane.  OCCUPATION: Retired.  PLOF: Independent with basic ADLs and Independent with household mobility with device  PATIENT GOALS: Get back to where she was before the onset of this pain.   OBJECTIVE:  Note: Objective measures were completed at Evaluation unless otherwise noted.    POSTURE: rounded shoulders, forward head, decreased lumbar  lordosis, increased thoracic kyphosis, flexed trunk , and Bilateral knee flexion (-15 degrees).  PALPATION: Very tender to palpation over right SIJ and proximal gluteal musculature.  LUMBAR ROM:   Lumbar flexion limited by 50% and extension to neutral.  LOWER EXTREMITY ROM:     Right hip IR limited to neutral.  LOWER EXTREMITY MMT:   The patient is able to provide a normal strength grade for bilateral LE's via manual muscle testing.    LUMBAR SPECIAL TESTS:  Equal leg lengths. (+) right SLR.  Pain with attempted right FABER test.   Patellar reflexes are 3+/4+ and Achilles are 2+/4+.  GAIT: The patient's gait is antalgic in nature and she walks in a flexed trunk posture with a Hurry cane.    TREATMENT DATE:   10/28/23:  Nustep level 4 x 16 minutes f/b 1-1 manual stretching into right SKTC, trunk rotation and right femoral traction sustained and with oscillations and gentle left sacral oscillations (8 minutes total) f/b HMP and IFC at 80-150 Hz on 40% scan x 20 minutes.       10/26/23:  Nustep level 4 x 15 minutes f/b 1-1 manual stretching into right SKTC, trunk rotation and right femoral traction sustained and with oscillations f/b HMP and IFC at 80-150 Hz on 40% scan x 20 minutes.                                                                                                                                   PATIENT EDUCATION:  Education details: See below. Person educated: Patient Education method: Explanation Education comprehension: verbalized understanding, handout.  HOME EXERCISE PROGRAM:  HOME EXERCISE PROGRAM Created by [place] Maryam Feely Feb 3rd, 2025 View at [email or web address] using code: [id number] Total 4 Page 1 of 2 Adductor Squeeze MET Lying on your back with knees bent, place a ball or folded pillow between your knees. Squeeze your knees together with moderate force and hold for an 8 count. Repeat 4 Times Hold 5 Seconds Perform 2 Times  Bridges While  laying on your back, contract the low abdominals and  lift from the hips. Repeat 15 Times Hold 2 Seconds Complete 2 Sets Perform 2 Times a Day SINGLE KNEE TO CHEST STRETCH - SKTC While Lying on your back, hold your knee and gently pull it up towards your chest. Repeat 3 Times Hold 30 Seconds Complete 1 Set Perform 3 Times a Day DOUBLE KNEE TO CHEST STRETCH - DKTC While Lying on your back, hold your knees and gently pull them up towards your chest. Repeat 2 Times Hold 1 Minute Complete 1 Set Perform 3 Times a Day   ASSESSMENT:  CLINICAL IMPRESSION: Patient reporting pain has not been constant.  Her gait was less antalgic today.    OBJECTIVE IMPAIRMENTS: decreased activity tolerance, decreased ROM, increased muscle spasms, postural dysfunction, and pain.   ACTIVITY LIMITATIONS: carrying, lifting, bending, standing, bed mobility, and locomotion level  PARTICIPATION LIMITATIONS: meal prep, cleaning, laundry, and community activity  PERSONAL FACTORS: 1 comorbidity: h/o LBP  are also affecting patient's functional outcome.   REHAB POTENTIAL: Good  CLINICAL DECISION MAKING: Evolving/moderate complexity  EVALUATION COMPLEXITY: Low   GOALS: LONG TERM GOALS: Target date: 11/09/13  Ind with a HEP.  Goal status: MET.  2.  Perform ADL's with pain not > 2-3/10.  Goal status: Partially met.  3.  Eliminate right buttock pain.  Goal status: INITIAL  4.  Walk in upright posture with pain not > 3/10.  Goal status: Partially met. PLAN:  PT FREQUENCY: 2x/week  PT DURATION: 6 weeks  PLANNED INTERVENTIONS: 97110-Therapeutic exercises, 97530- Therapeutic activity, [id number]- Neuromuscular re-education, 97535- Self Care, 16109- Manual therapy, 97014- Electrical stimulation (unattended), 97035- Ultrasound, Patient/Family education, Dry Needling, Cryotherapy, and Moist heat.  PLAN FOR NEXT SESSION: Combo e'stim/US at 1.50 W/CM2, STW/M, Core exercise progression, spinal protection techniques  and body mechanics training.   Benny Henrie, [place], PT 10/28/2023, 10:14 AM

## 2023-11-02 ENCOUNTER — Ambulatory Visit: Payer: Medicare Other | Attending: Physical Medicine and Rehabilitation | Admitting: *Deleted

## 2023-11-02 ENCOUNTER — Encounter: Payer: Self-pay | Admitting: *Deleted

## 2023-11-02 DIAGNOSIS — M62830 Muscle spasm of back: Secondary | ICD-10-CM | POA: Diagnosis not present

## 2023-11-02 DIAGNOSIS — R293 Abnormal posture: Secondary | ICD-10-CM | POA: Insufficient documentation

## 2023-11-02 DIAGNOSIS — M5459 Other low back pain: Secondary | ICD-10-CM | POA: Insufficient documentation

## 2023-11-02 NOTE — Therapy (Signed)
 OUTPATIENT PHYSICAL THERAPY THORACOLUMBAR TREATMENT  Patient Name: Misty Smith MRN: [id number] DOB:[birth year], 83 y.o., female Today's Date: 11/02/2023  END OF SESSION:  PT End of Session - 11/02/23 0859     Visit Number 9    Number of Visits 12    Date for PT Re-Evaluation 11/10/23    PT Start Time 0845    PT Stop Time 0935    PT Time Calculation (min) 50 min               Past Medical History:  Diagnosis Date   Allergy    Anxiety    Arthritis    Bronchitis    Cataract    Colovaginal fistula    Depression    Diabetes mellitus without complication (HCC)    Dysrhythmia    palpitations, occasional PVCs; Dr Jens Som cardiologist   Esophageal stricture    Fractured elbow 1970's   GERD (gastroesophageal reflux disease)    H/O hiatal hernia    Hyperlipidemia    Hypertension    Osteopenia    Perimenopausal vasomotor symptoms    Postmenopausal HRT (hormone replacement therapy)    Pre-diabetes    Stress    Subacute lupus erythematosus    Dr. Terri Piedra   Past Surgical History:  Procedure Laterality Date   abdominal bypass  1987   fibroids    ABDOMINAL HYSTERECTOMY     partial   bilateral tubal ligtation  1972   CHOLECYSTECTOMY N/A 03/21/2014   Procedure: LAPAROSCOPIC CHOLECYSTECTOMY WITH INTRAOPERATIVE CHOLANGIOGRAM;  Surgeon: Kandis Cocking, MD;  Location: [facility] OR;  Service: General;  Laterality: N/A;   COLONOSCOPY     elbow Right 1976   surgery after fracture of elbow   EYE SURGERY Bilateral    cataracts   ROTATOR CUFF REPAIR Right 03/29/2018   TONSILLECTOMY     TUBAL LIGATION     Patient Active Problem List   Diagnosis Date Noted   Sensorineural hearing loss, 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   Osteoarthritis of knee 09/11/2021   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   Upper airway cough syndrome 02/08/2021   Type 2 diabetes mellitus with other specified complication (HCC) 01/30/2021   Lumbar radiculopathy 01/11/2021   Osteoarthritis of acromioclavicular joint  01/12/2018   Impingement syndrome of shoulder region 09/26/2017   Vitamin D deficiency 03/18/2016   CAD (coronary artery disease) 12/30/2013   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   Osteopenia of the elderly 10/26/2013   Generalized anxiety disorder 09/21/2013   GERD (gastroesophageal reflux disease) 09/21/2013   Multiple pulmonary 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  SUBJECTIVE:  SUBJECTIVE STATEMENT: Pain 5-6/10 today. Sharp shooting pain sometimes. To MD Thursday  PERTINENT HISTORY:  H/o LBP.  PAIN:  Are you having pain? Yes: NPRS scale: 5-6/10. Pain location: Righ LB/SIJ Pain description: Sharp. Aggravating factors: As above. Relieving factors: As above.    PRECAUTIONS: None  RED FLAGS: None   WEIGHT BEARING RESTRICTIONS: No  FALLS:  Has patient fallen in last 6 months? No  LIVING ENVIRONMENT: Lives with: lives with their family Lives in: House/apartment Has following equipment at home:  Patient uses a Hurry cane.  OCCUPATION: Retired.  PLOF: Independent with basic ADLs and Independent with household mobility with device  PATIENT GOALS: Get back to where she was before the onset of this pain.   OBJECTIVE:  Note: Objective measures were completed at Evaluation unless otherwise noted.    POSTURE: rounded shoulders, forward head, decreased lumbar lordosis, increased thoracic kyphosis, flexed trunk , and Bilateral knee flexion (-15  degrees).  PALPATION: Very tender to palpation over right SIJ and proximal gluteal musculature.  LUMBAR ROM:   Lumbar flexion limited by 50% and extension to neutral.  LOWER EXTREMITY ROM:     Right hip IR limited to neutral.  LOWER EXTREMITY MMT:   The patient is able to provide a normal strength grade for bilateral LE's via manual muscle testing.    LUMBAR SPECIAL TESTS:  Equal leg lengths. (+) right SLR.  Pain with attempted right FABER test.   Patellar reflexes are 3+/4+ and Achilles are 2+/4+.  GAIT: The patient's gait is antalgic in nature and she walks in a flexed trunk posture with a Hurry cane.    TREATMENT DATE:   11/02/23: Discussed and reviewed movement patterns and AD for ADL's to prevent pain triggers  Nustep level 4 x 15 minutes  Manual: STW/TPR to RT SIJ and QL with Pt LT side lying  HMP and IFC at 80-150 Hz on 40% scan x 15 minutes.       10/26/23:  Nustep level 4 x 15 minutes f/b 1-1 manual stretching into right SKTC, trunk rotation and right femoral traction sustained and with oscillations f/b HMP and IFC at 80-150 Hz on 40% scan x 20 minutes.                                                                                                                                   PATIENT EDUCATION:  Education details: See below. Person educated: Patient Education method: Explanation Education comprehension: verbalized understanding, handout.  HOME EXERCISE PROGRAM:  HOME EXERCISE PROGRAM Created by [place] Applegate Feb 3rd, 2025 View at [email or web address] using code: [email or web address] Total 4 Page 1 of 2 Adductor Squeeze MET Lying on your back with knees bent, place a ball or folded pillow between your knees. Squeeze your knees together with moderate force and hold for an 8 count. Repeat 4 Times Hold 5 Seconds Perform 2 Times  Bridges While laying on your back, contract the  low abdominals and lift from the hips. Repeat 15 Times Hold 2 Seconds Complete 2 Sets  Perform 2 Times a Day SINGLE KNEE TO CHEST STRETCH - SKTC While Lying on your back, hold your knee and gently pull it up towards your chest. Repeat 3 Times Hold 30 Seconds Complete 1 Set Perform 3 Times a Day DOUBLE KNEE TO CHEST STRETCH - DKTC While Lying on your back, hold your knees and gently pull them up towards your chest. Repeat 2 Times Hold 1 Minute Complete 1 Set Perform 3 Times a Day   ASSESSMENT:  CLINICAL IMPRESSION: Pt arrived today reporting maybe 25% better overall on some days, but still gets shooting pain. Rx focused on improved movement patterns and using assisted devises with ADL's to decrease pain triggers throughout the day. She did well with therex and STW to RT SIJ and RT QL with notable tenderness. IFC / HMP end of session. MD note next visit   OBJECTIVE IMPAIRMENTS: decreased activity tolerance, decreased ROM, increased muscle spasms, postural dysfunction, and pain.   ACTIVITY LIMITATIONS: carrying, lifting, bending, standing, bed mobility, and locomotion level  PARTICIPATION LIMITATIONS: meal prep, cleaning, laundry, and community activity  PERSONAL FACTORS: 1 comorbidity: h/o LBP  are also affecting patient's functional outcome.   REHAB POTENTIAL: Good  CLINICAL DECISION MAKING: Evolving/moderate complexity  EVALUATION COMPLEXITY: Low   GOALS: LONG TERM GOALS: Target date: 11/09/13  Ind with a HEP.  Goal status: MET.  2.  Perform ADL's with pain not > 2-3/10.  Goal status: Partially met.  3.  Eliminate right buttock pain.  Goal status: INITIAL  4.  Walk in upright posture with pain not > 3/10.  Goal status: Partially met. PLAN:  PT FREQUENCY: 2x/week  PT DURATION: 6 weeks  PLANNED INTERVENTIONS: 97110-Therapeutic exercises, 97530- Therapeutic activity, [id number]- Neuromuscular re-education, 97535- Self Care, 40981- Manual therapy, 97014- Electrical stimulation (unattended), 97035- Ultrasound, Patient/Family education, Dry Needling,  Cryotherapy, and Moist heat.  PLAN FOR NEXT SESSION: MD note next visit Combo e'stim/US at 1.50 W/CM2, STW/M, Core exercise progression, spinal protection techniques and body mechanics training.   Aquita Simmering,CHRIS, PTA 11/02/2023, 11:30 AM

## 2023-11-04 ENCOUNTER — Encounter: Payer: Medicare Other | Admitting: *Deleted

## 2023-11-09 ENCOUNTER — Ambulatory Visit

## 2023-11-09 DIAGNOSIS — M62830 Muscle spasm of back: Secondary | ICD-10-CM

## 2023-11-09 DIAGNOSIS — R293 Abnormal posture: Secondary | ICD-10-CM | POA: Diagnosis not present

## 2023-11-09 DIAGNOSIS — M5459 Other low back pain: Secondary | ICD-10-CM | POA: Diagnosis not present

## 2023-11-09 NOTE — Therapy (Signed)
 OUTPATIENT PHYSICAL THERAPY THORACOLUMBAR TREATMENT  Patient Name: Misty Smith MRN: [id number] DOB:[birth year], 83 y.o., female Today's Date: 11/09/2023  END OF SESSION:  PT End of Session - 11/09/23 0851     Visit Number 10    Number of Visits 12    Date for PT Re-Evaluation 11/10/23    PT Start Time 0848    PT Stop Time 0945    PT Time Calculation (min) 57 min               Past Medical History:  Diagnosis Date   Allergy    Anxiety    Arthritis    Bronchitis    Cataract    Colovaginal fistula    Depression    Diabetes mellitus without complication (HCC)    Dysrhythmia    palpitations, occasional PVCs; Dr Jens Som cardiologist   Esophageal stricture    Fractured elbow 1970's   GERD (gastroesophageal reflux disease)    H/O hiatal hernia    Hyperlipidemia    Hypertension    Osteopenia    Perimenopausal vasomotor symptoms    Postmenopausal HRT (hormone replacement therapy)    Pre-diabetes    Stress    Subacute lupus erythematosus    Dr. Terri Piedra   Past Surgical History:  Procedure Laterality Date   abdominal bypass  1987   fibroids    ABDOMINAL HYSTERECTOMY     partial   bilateral tubal ligtation  1972   CHOLECYSTECTOMY N/A 03/21/2014   Procedure: LAPAROSCOPIC CHOLECYSTECTOMY WITH INTRAOPERATIVE CHOLANGIOGRAM;  Surgeon: Kandis Cocking, MD;  Location: [facility] OR;  Service: General;  Laterality: N/A;   COLONOSCOPY     elbow Right 1976   surgery after fracture of elbow   EYE SURGERY Bilateral    cataracts   ROTATOR CUFF REPAIR Right 03/29/2018   TONSILLECTOMY     TUBAL LIGATION     Patient Active Problem List   Diagnosis Date Noted   Sensorineural hearing loss, 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   Osteoarthritis of knee 09/11/2021   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   Upper airway cough syndrome 02/08/2021   Type 2 diabetes mellitus with other specified complication (HCC) 01/30/2021   Lumbar radiculopathy 01/11/2021   Osteoarthritis of acromioclavicular joint  01/12/2018   Impingement syndrome of shoulder region 09/26/2017   Vitamin D deficiency 03/18/2016   CAD (coronary artery disease) 12/30/2013   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   Osteopenia of the elderly 10/26/2013   Generalized anxiety disorder 09/21/2013   GERD (gastroesophageal reflux disease) 09/21/2013   Multiple pulmonary 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  SUBJECTIVE:  SUBJECTIVE STATEMENT: Pt reports 4-5/10 right low back and SI joint pain today.  Pt unable to see MD last week due to MD being sick, appointment rescheduled for next week.  PERTINENT HISTORY:  H/o LBP.  PAIN:  Are you having pain? Yes: NPRS scale: 4-5/10. Pain location: Righ LB/SIJ Pain description: Sharp. Aggravating factors: As above. Relieving factors: As above.    PRECAUTIONS: None  RED FLAGS: None   WEIGHT BEARING RESTRICTIONS: No  FALLS:  Has patient fallen in last 6 months? No  LIVING ENVIRONMENT: Lives with: lives with their family Lives in: House/apartment Has following equipment at home:  Patient uses a Hurry cane.  OCCUPATION: Retired.  PLOF: Independent with basic ADLs and Independent with household mobility with device  PATIENT GOALS: Get back to where she was before the onset of this pain.   OBJECTIVE:  Note: Objective measures were completed at Evaluation unless otherwise noted.    POSTURE: rounded shoulders, forward head, decreased lumbar lordosis,  increased thoracic kyphosis, flexed trunk , and Bilateral knee flexion (-15 degrees).  PALPATION: Very tender to palpation over right SIJ and proximal gluteal musculature.  LUMBAR ROM:   Lumbar flexion limited by 50% and extension to neutral.  LOWER EXTREMITY ROM:     Right hip IR limited to neutral.  LOWER EXTREMITY MMT:   The patient is able to provide a normal strength grade for bilateral LE's via manual muscle testing.    LUMBAR SPECIAL TESTS:  Equal leg lengths. (+) right SLR.  Pain with attempted right FABER test.   Patellar reflexes are 3+/4+ and Achilles are 2+/4+.  GAIT: The patient's gait is antalgic in nature and she walks in a flexed trunk posture with a Hurry cane.    TREATMENT DATE:  11/09/23:  Nustep level 4 x 15 minutes  Manual: STW/TPR to RT SIJ and QL with Pt LT side lying HMP and IFC at 80-150 Hz on 40% scan x 15 minutes.   11/02/23: Discussed and reviewed movement patterns and AD for ADL's to prevent pain triggers  Nustep level 4 x 15 minutes  Manual: STW/TPR to RT SIJ and QL with Pt LT side lying  HMP and IFC at 80-150 Hz on 40% scan x 15 minutes.    10/26/23:  Nustep level 4 x 15 minutes f/b 1-1 manual stretching into right SKTC, trunk rotation and right femoral traction sustained and with oscillations f/b HMP and IFC at 80-150 Hz on 40% scan x 20 minutes.                                                                                                                               PATIENT EDUCATION:  Education details: See below. Person educated: Patient Education method: Explanation Education comprehension: verbalized understanding, handout.  HOME EXERCISE PROGRAM:  HOME EXERCISE PROGRAM Created by [place] Applegate Feb 3rd, 2025 View at [email or web address] using code: [email or web address] Total 4 Page 1 of 2 Adductor Squeeze MET Lying  on your back with knees bent, place a ball or folded pillow between your knees. Squeeze your knees together with moderate force  and hold for an 8 count. Repeat 4 Times Hold 5 Seconds Perform 2 Times  Bridges While laying on your back, contract the low abdominals and lift from the hips. Repeat 15 Times Hold 2 Seconds Complete 2 Sets Perform 2 Times a Day SINGLE KNEE TO CHEST STRETCH - SKTC While Lying on your back, hold your knee and gently pull it up towards your chest. Repeat 3 Times Hold 30 Seconds Complete 1 Set Perform 3 Times a Day DOUBLE KNEE TO CHEST STRETCH - DKTC While Lying on your back, hold your knees and gently pull them up towards your chest. Repeat 2 Times Hold 1 Minute Complete 1 Set Perform 3 Times a Day   ASSESSMENT:  CLINICAL IMPRESSION: Pt arrives for today's treatment session reporting 4-5/10 low back pain.  Pt reports she was unable to see MD on Friday due to MD being sick, appointment rescheduled for next week.  Pt reports 25% improvement in low back and SI joint pain since beginning physical therapy.  PT has not been able to eliminate the pain in right buttock as of this time.  Pt reports that her pain level greatly depends on the day and the activities performed.  STW/M performed to right low back/SI joint region to decrease pain and tone.  Normal responses to estim and MH noted upon removal.  Pt ready for discharge today.  Pt reported minimal decrease in pain at completion of today's treatment session.  OBJECTIVE IMPAIRMENTS: decreased activity tolerance, decreased ROM, increased muscle spasms, postural dysfunction, and pain.   ACTIVITY LIMITATIONS: carrying, lifting, bending, standing, bed mobility, and locomotion level  PARTICIPATION LIMITATIONS: meal prep, cleaning, laundry, and community activity  PERSONAL FACTORS: 1 comorbidity: h/o LBP  are also affecting patient's functional outcome.   REHAB POTENTIAL: Good  CLINICAL DECISION MAKING: Evolving/moderate complexity  EVALUATION COMPLEXITY: Low   GOALS: LONG TERM GOALS: Target date: 11/09/13  Ind with a HEP.  Goal status:  MET.  2.  Perform ADL's with pain not > 2-3/10.  Goal status: Partially met.  3.  Eliminate right buttock pain.  Goal status: Not met  4.  Walk in upright posture with pain not > 3/10.  Goal status: Partially met. PLAN:  PT FREQUENCY: 2x/week  PT DURATION: 6 weeks  PLANNED INTERVENTIONS: 97110-Therapeutic exercises, 97530- Therapeutic activity, [id number]- Neuromuscular re-education, 97535- Self Care, 83151- Manual therapy, 97014- Electrical stimulation (unattended), 97035- Ultrasound, Patient/Family education, Dry Needling, Cryotherapy, and Moist heat.  PLAN FOR NEXT SESSION: MD note next visit Combo e'stim/US at 1.50 W/CM2, STW/M, Core exercise progression, spinal protection techniques and body mechanics training.   Newman Pies, PTA 11/09/2023, 9:49 AM   PHYSICAL THERAPY DISCHARGE SUMMARY  Visits from Start of Care: 10.  Current functional level related to goals / functional outcomes: See above.     Remaining deficits: See goal section.  Patient has continued pain.   Education / Equipment: HEP.   Patient agrees to discharge. Patient goals were partially met. Patient is being discharged due to lack of progress.    [place] Applegate MPT

## 2023-11-17 DIAGNOSIS — E1142 Type 2 diabetes mellitus with diabetic polyneuropathy: Secondary | ICD-10-CM | POA: Diagnosis not present

## 2023-11-17 DIAGNOSIS — L84 Corns and callosities: Secondary | ICD-10-CM | POA: Diagnosis not present

## 2023-11-17 DIAGNOSIS — M79676 Pain in unspecified toe(s): Secondary | ICD-10-CM | POA: Diagnosis not present

## 2023-11-17 DIAGNOSIS — B351 Tinea unguium: Secondary | ICD-10-CM | POA: Diagnosis not present

## 2023-11-18 ENCOUNTER — Other Ambulatory Visit: Payer: Self-pay | Admitting: Cardiology

## 2023-11-18 DIAGNOSIS — M5459 Other low back pain: Secondary | ICD-10-CM | POA: Diagnosis not present

## 2023-11-18 DIAGNOSIS — M5416 Radiculopathy, lumbar region: Secondary | ICD-10-CM | POA: Diagnosis not present

## 2023-12-02 ENCOUNTER — Other Ambulatory Visit

## 2023-12-02 DIAGNOSIS — E1169 Type 2 diabetes mellitus with other specified complication: Secondary | ICD-10-CM

## 2023-12-02 LAB — BAYER DCA HB A1C WAIVED: HB A1C (BAYER DCA - WAIVED): 5.7 % — ABNORMAL HIGH (ref 4.8–5.6)

## 2023-12-03 DIAGNOSIS — M5416 Radiculopathy, lumbar region: Secondary | ICD-10-CM | POA: Diagnosis not present

## 2023-12-03 DIAGNOSIS — M5459 Other low back pain: Secondary | ICD-10-CM | POA: Diagnosis not present

## 2023-12-03 LAB — BMP8+EGFR
BUN/Creatinine Ratio: 22 (ref 12–28)
BUN: 15 mg/dL (ref 8–27)
CO2: 24 mmol/L (ref 20–29)
Calcium: 10 mg/dL (ref 8.7–10.3)
Chloride: 97 mmol/L (ref 96–106)
Creatinine, Ser: 0.68 mg/dL (ref 0.57–1.00)
Glucose: 95 mg/dL (ref 70–99)
Potassium: 4.4 mmol/L (ref 3.5–5.2)
Sodium: 139 mmol/L (ref 134–144)
eGFR: 86 mL/min/{1.73_m2} (ref >59)

## 2023-12-07 ENCOUNTER — Ambulatory Visit (INDEPENDENT_AMBULATORY_CARE_PROVIDER_SITE_OTHER): Payer: Medicare Other | Admitting: Family Medicine

## 2023-12-07 ENCOUNTER — Encounter: Payer: Self-pay | Admitting: Family Medicine

## 2023-12-07 VITALS — BP 134/51 | HR 63 | Ht 59.0 in | Wt 161.0 lb

## 2023-12-07 DIAGNOSIS — E1159 Type 2 diabetes mellitus with other circulatory complications: Secondary | ICD-10-CM | POA: Diagnosis not present

## 2023-12-07 DIAGNOSIS — I2510 Atherosclerotic heart disease of native coronary artery without angina pectoris: Secondary | ICD-10-CM | POA: Diagnosis not present

## 2023-12-07 DIAGNOSIS — E1169 Type 2 diabetes mellitus with other specified complication: Secondary | ICD-10-CM

## 2023-12-07 DIAGNOSIS — I152 Hypertension secondary to endocrine disorders: Secondary | ICD-10-CM | POA: Diagnosis not present

## 2023-12-07 DIAGNOSIS — E785 Hyperlipidemia, unspecified: Secondary | ICD-10-CM | POA: Diagnosis not present

## 2023-12-07 DIAGNOSIS — Z7984 Long term (current) use of oral hypoglycemic drugs: Secondary | ICD-10-CM

## 2023-12-07 MED ORDER — ATORVASTATIN CALCIUM 40 MG PO TABS: 40.0000 mg | ORAL_TABLET | Freq: Every day | ORAL | 3 refills | Status: AC

## 2023-12-07 MED ORDER — OMEGA-3-ACID ETHYL ESTERS 1 G PO CAPS: ORAL_CAPSULE | ORAL | 3 refills | Status: DC

## 2023-12-07 MED ORDER — AMLODIPINE BESYLATE 10 MG PO TABS: 10.0000 mg | ORAL_TABLET | Freq: Every day | ORAL | 3 refills | Status: AC

## 2023-12-07 MED ORDER — IRBESARTAN 75 MG PO TABS: 75.0000 mg | ORAL_TABLET | Freq: Every day | ORAL | 3 refills | Status: AC

## 2023-12-07 NOTE — Addendum Note (Signed)
 Addended by: Dorene Sorrow on: 12/07/2023 09:16 AM   Modules accepted: Orders

## 2023-12-07 NOTE — Progress Notes (Signed)
 BP (!) 134/51   Pulse 63   Ht 4\' 11"  (1.499 m)   Wt 161 lb (73 kg)   SpO2 96%   BMI 32.52 kg/m    Subjective:   Patient ID: Misty Smith, female    DOB: [birth year], 83 y.o.   MRN: [id number]  HPI: Misty Smith is a 83 y.o. female presenting on 12/07/2023 for Medical Management of Chronic Issues, Hyperlipidemia, and Diabetes   HPI Type 2 diabetes mellitus Patient comes in today for recheck of his diabetes. Patient has been currently taking Actos. Patient is currently on an ACE inhibitor/ARB. Patient has not seen an ophthalmologist this year. Patient denies any new issues with their feet. The symptom started onset as an adult hypertension and hyperlipidemia ARE RELATED TO DM   Hypertension Patient is currently on hydrochlorothiazide and amlodipine and irbesartan, and their blood pressure today is close 134/51. Patient denies any lightheadedness or dizziness. Patient denies headaches, blurred vision, chest pains, shortness of breath, or weakness. Denies any side effects from medication and is content with current medication.  Patient has a little swelling in her legs but she has been more sedentary since she has had some back issues.  Hyperlipidemia and CAD recheck Patient is coming in for recheck of his hyperlipidemia. The patient is currently taking atorvastatin and fish oils. They deny any issues with myalgias or history of liver damage from it. They deny any focal numbness or weakness or chest pain.   Relevant past medical, surgical, family and social history reviewed and updated as indicated. Interim medical history since our last visit reviewed. Allergies and medications reviewed and updated.  Review of Systems  Constitutional:  Negative for chills and fever.  Eyes:  Negative for visual disturbance.  Respiratory:  Negative for chest tightness and shortness of breath.   Cardiovascular:  Negative for chest pain and leg swelling.  Genitourinary:  Negative for difficulty  urinating and dysuria.  Musculoskeletal:  Positive for arthralgias and back pain. Negative for gait problem.  Skin:  Negative for rash.  Neurological:  Negative for light-headedness and headaches.  Psychiatric/Behavioral:  Negative for agitation and behavioral problems.   All other systems reviewed and are negative.   Per HPI unless specifically indicated above   Allergies as of 12/07/2023       Reactions   Ciprofloxacin Rash   Codeine Rash   Iohexol     Desc: PT HAD HIVES AND WAS GIVEN BENEDRYL PO BY NURSE AND OBSERVED IN NURSES STATION AND RELEASED WITHOUT AND OTHER COMPLICATIONS SHEET SCANNED UNDER NURSES NOTES///ON 12/01/11 PT HAD ANOTHER CT WITH PREMEDS MINUS THE BENADRYL. SHE HAD A DELAYED REACTION AFTER WITH THROAT TIGHTNESS AND SOB, JB  12/07/12   Penicillins Hives   Sulfonamide Derivatives Hives   Asa [aspirin] Other (See Comments)   Large amounts causes stomach pain   Keflex [cephalexin] Nausea And Vomiting   Morphine And Codeine Rash        Medication List        Accurate as of December 07, 2023  8:57 AM. If you have any questions, ask your nurse or doctor.          amLODipine 10 MG tablet Commonly known as: NORVASC Take 1 tablet (10 mg total) by mouth daily.   aspirin EC 81 MG tablet Take 1 tablet (81 mg total) by mouth daily.   atorvastatin 40 MG tablet Commonly known as: LIPITOR Take 1 tablet (40 mg total) by mouth at bedtime.  betamethasone dipropionate 0.05 % cream Apply topically.   Calcium Carb-Cholecalciferol 600-800 MG-UNIT Tabs Take 1 tablet by mouth every evening.   cholecalciferol 1000 units tablet Commonly known as: VITAMIN D Take 2,000 Units by mouth daily.   Clinpro 5000 1.1 % Pste Generic drug: Sodium Fluoride Place onto teeth 2 (two) times daily.   escitalopram 20 MG tablet Commonly known as: LEXAPRO Take 1 tablet (20 mg total) by mouth daily.   esomeprazole 40 MG capsule Commonly known as: NEXIUM Take 1 capsule (40 mg total) by  mouth daily.   estradiol 0.1 MG/GM vaginal cream Commonly known as: ESTRACE Place vaginally.   hydrochlorothiazide 12.5 MG tablet Commonly known as: HYDRODIURIL Take 1 tablet (12.5 mg total) by mouth daily.   hydrocortisone 2.5 % cream PLEASE SEE ATTACHED FOR DETAILED DIRECTIONS   irbesartan 75 MG tablet Commonly known as: AVAPRO Take 1 tablet (75 mg total) by mouth daily.   ketoconazole 2 % cream Commonly known as: NIZORAL Apply topically 2 (two) times daily as needed.   metoprolol tartrate 25 MG tablet Commonly known as: LOPRESSOR Take 12.5 mg by mouth at bedtime. What changed: Another medication with the same name was removed. Continue taking this medication, and follow the directions you see here. Changed by: Elige Radon Jcion Buddenhagen   multivitamin tablet Take 1 tablet by mouth every morning.   omega-3 acid ethyl esters 1 g capsule Commonly known as: LOVAZA TAKE 2 CAPSULES BY MOUTH 2 TIMES DAILY.   pioglitazone 30 MG tablet Commonly known as: ACTOS Take 1 tablet (30 mg total) by mouth daily.         Objective:   BP (!) 134/51   Pulse 63   Ht 4\' 11"  (1.499 m)   Wt 161 lb (73 kg)   SpO2 96%   BMI 32.52 kg/m   Wt Readings from Last 3 Encounters:  12/07/23 161 lb (73 kg)  09/04/23 161 lb (73 kg)  08/06/23 162 lb (73.5 kg)    Physical Exam Vitals and nursing note reviewed.  Constitutional:      General: She is not in acute distress.    Appearance: She is well-developed. She is not diaphoretic.  Eyes:     Conjunctiva/sclera: Conjunctivae normal.  Cardiovascular:     Rate and Rhythm: Normal rate and regular rhythm.     Heart sounds: Normal heart sounds. No murmur heard. Pulmonary:     Effort: Pulmonary effort is normal. No respiratory distress.     Breath sounds: Normal breath sounds. No wheezing.  Musculoskeletal:        General: Swelling (1+ bilateral lower extremity edema) present. Normal range of motion.  Skin:    General: Skin is warm and dry.      Findings: No rash.  Neurological:     Mental Status: She is alert and oriented to person, place, and time.     Coordination: Coordination normal.  Psychiatric:        Behavior: Behavior normal.     Results for orders placed or performed in visit on 12/02/23  Bayer DCA Hb A1c Waived   Collection Time: 12/02/23  9:43 AM  Result Value Ref Range   HB A1C (BAYER DCA - WAIVED) 5.7 (H) 4.8 - 5.6 %  BMP8+EGFR   Collection Time: 12/02/23  9:45 AM  Result Value Ref Range   Glucose 95 70 - 99 mg/dL   BUN 15 8 - 27 mg/dL   Creatinine, Ser 1.47 0.57 - 1.00 mg/dL   eGFR 86 >  59 mL/min/1.73   BUN/Creatinine Ratio 22 12 - 28   Sodium 139 134 - 144 mmol/L   Potassium 4.4 3.5 - 5.2 mmol/L   Chloride 97 96 - 106 mmol/L   CO2 24 20 - 29 mmol/L   Calcium 10.0 8.7 - 10.3 mg/dL    Assessment & Plan:   Problem List Items Addressed This Visit       Cardiovascular and Mediastinum   Hypertension associated with type 2 diabetes mellitus (HCC)   Relevant Medications   metoprolol tartrate (LOPRESSOR) 25 MG tablet   amLODipine (NORVASC) 10 MG tablet   atorvastatin (LIPITOR) 40 MG tablet   irbesartan (AVAPRO) 75 MG tablet   omega-3 acid ethyl esters (LOVAZA) 1 g capsule   CAD (coronary artery disease)   Relevant Medications   metoprolol tartrate (LOPRESSOR) 25 MG tablet   amLODipine (NORVASC) 10 MG tablet   atorvastatin (LIPITOR) 40 MG tablet   irbesartan (AVAPRO) 75 MG tablet   omega-3 acid ethyl esters (LOVAZA) 1 g capsule     Endocrine   Hyperlipidemia associated with type 2 diabetes mellitus (HCC)   Relevant Medications   metoprolol tartrate (LOPRESSOR) 25 MG tablet   amLODipine (NORVASC) 10 MG tablet   atorvastatin (LIPITOR) 40 MG tablet   irbesartan (AVAPRO) 75 MG tablet   omega-3 acid ethyl esters (LOVAZA) 1 g capsule   Type 2 diabetes mellitus with other specified complication (HCC) - Primary   Relevant Medications   atorvastatin (LIPITOR) 40 MG tablet   irbesartan (AVAPRO) 75 MG  tablet    A1c looks good the blood pressure looks good.  Patient has a slight edema and likely because she has been more sedentary while they are trying to get her back fixed.  She has an MRI that she had last week and going to see the doctor again once they give her a call about the MRI results.  Encouraged ways to exercise and keep moving while sitting Follow up plan: Return in about 3 months (around 03/07/2024), or if symptoms worsen or fail to improve, for Diabetes.  Counseling provided for all of the vaccine components No orders of the defined types were placed in this encounter.   Arville Care, MD [facility] 12/07/2023, 8:57 AM

## 2023-12-08 DIAGNOSIS — E785 Hyperlipidemia, unspecified: Secondary | ICD-10-CM | POA: Insufficient documentation

## 2023-12-08 LAB — MICROALBUMIN / CREATININE URINE RATIO
Creatinine, Urine: 171.5 mg/dL
Microalb/Creat Ratio: 40 mg/g{creat} — ABNORMAL HIGH (ref 0–29)
Microalbumin, Urine: 68.9 ug/mL

## 2023-12-08 NOTE — Progress Notes (Unsigned)
  Cardiology Office Note:   Date:  12/09/2023  ID:  Emon, Miggins [birth year], MRN [id number] PCP: Dettinger, Elige Radon, MD  [facility] Providers Cardiologist:  Olga Millers, MD {  History of Present Illness:   Misty HENDRICKSEN is a 83 y.o. female who is changing her care to me to be closer to home.  She was seeing Dr. Jens Som.  She was followed for evaluation of   palpitations and hypertension.  She had a history of PVCs.  She has had an echo with normal LV function and a negative myoview in 2010.  She does have coronary calcium.    She lives by herself.  She does her own chores to include vacuuming.  She has a lift chair down to the basement.  Her son lives below her and checks on her frequently.  Grandchild sometimes lives with her. The patient denies any new symptoms such as chest discomfort, neck or arm discomfort. There has been no new shortness of breath, PND or orthopnea. There have been no reported palpitations, presyncope or syncope.    ROS:   Positive for back pain (she walks with a cane.)  Otherwise as stated in the HPI and negative for all other systems.  Studies Reviewed:    EKG:   EKG Interpretation Date/Time:  Wednesday December 09 2023 09:55:00 EDT Ventricular Rate:  56 PR Interval:  174 QRS Duration:  82 QT Interval:  426 QTC Calculation: 411 R Axis:   52  Text Interpretation: Sinus bradycardia with sinus arrhythmia Confirmed by Rollene Rotunda ([id number]) on 12/09/2023 10:23:49 AM     Risk Assessment/Calculations:              Physical Exam:   VS:  BP 124/70   Pulse (!) 56   Ht 4\' 11"  (1.499 m)   Wt 161 lb (73 kg)   BMI 32.52 kg/m    Wt Readings from Last 3 Encounters:  12/09/23 161 lb (73 kg)  12/07/23 161 lb (73 kg)  09/04/23 161 lb (73 kg)     GEN: Well nourished, well developed in no acute distress NECK: No JVD; No carotid bruits CARDIAC: RRR, 2 out of 6 apical systolic murmur heard best at the right upper sternal border and early peaking,  no diastolic murmurs, rubs, gallops RESPIRATORY:  Clear to auscultation without rales, wheezing or rhonchi  ABDOMEN: Soft, non-tender, non-distended EXTREMITIES:  No edema; No deformity   ASSESSMENT AND PLAN:   Coronary artery disease:  The patient has no new sypmtoms.  No further cardiovascular testing is indicated.  We will continue with aggressive risk reduction and meds as listed.  Palpitations: She says she only feels palpitations if she does not take her p.m. beta-blocker.  She does tolerate the lower heart rate.  She can continue with the beta-blocker as listed.  No change in therapy.  Hypertension:  The blood pressure is at target. No change in medications is indicated. We will continue with therapeutic lifestyle changes (TLC).   Hyperlipidemia:   She has an excellent lipid profile.  No change in therapy.  Follow up with me in 18 months or sooner if needed.  Signed, Rollene Rotunda, MD

## 2023-12-09 ENCOUNTER — Encounter: Payer: Self-pay | Admitting: Cardiology

## 2023-12-09 ENCOUNTER — Encounter: Payer: Self-pay | Admitting: Family Medicine

## 2023-12-09 ENCOUNTER — Ambulatory Visit (INDEPENDENT_AMBULATORY_CARE_PROVIDER_SITE_OTHER): Payer: 59 | Admitting: Cardiology

## 2023-12-09 VITALS — BP 124/70 | HR 56 | Ht 59.0 in | Wt 161.0 lb

## 2023-12-09 DIAGNOSIS — R002 Palpitations: Secondary | ICD-10-CM

## 2023-12-09 DIAGNOSIS — E785 Hyperlipidemia, unspecified: Secondary | ICD-10-CM | POA: Diagnosis not present

## 2023-12-09 DIAGNOSIS — I2510 Atherosclerotic heart disease of native coronary artery without angina pectoris: Secondary | ICD-10-CM | POA: Diagnosis not present

## 2023-12-09 DIAGNOSIS — I10 Essential (primary) hypertension: Secondary | ICD-10-CM | POA: Diagnosis not present

## 2023-12-09 NOTE — Patient Instructions (Signed)
 Medication Instructions:  The current medical regimen is effective;  continue present plan and medications.  *If you need a refill on your cardiac medications before your next appointment, please call your pharmacy*  Follow-Up: At [facility], you and your health needs are our priority.  As part of our continuing mission to provide you with exceptional heart care, our providers are all part of one team.  This team includes your primary Cardiologist (physician) and Advanced Practice Providers or APPs (Physician Assistants and Nurse Practitioners) who all work together to provide you with the care you need, when you need it.  Your next appointment:   18 month(s)  Provider:   Rollene Rotunda, MD    We recommend signing up for the patient portal called "MyChart".  Sign up information is provided on this After Visit Summary.  MyChart is used to connect with patients for Virtual Visits (Telemedicine).  Patients are able to view lab/test results, encounter notes, upcoming appointments, etc.  Non-urgent messages can be sent to your provider as well.   To learn more about what you can do with MyChart, go to [email or web address].

## 2023-12-11 ENCOUNTER — Telehealth: Payer: Self-pay

## 2023-12-11 ENCOUNTER — Other Ambulatory Visit: Payer: Self-pay

## 2023-12-11 NOTE — Telephone Encounter (Signed)
 Pt confirmed that she does take Omega 3 once daily. If she takes it more than that it causes diarrhea. Please complete PA.

## 2023-12-11 NOTE — Telephone Encounter (Signed)
 I received a request to do a prior auth for Omega-3-acid Ethyl Esters 1GM capsules. Upon looking at her chart, it looks like she been non compliant with this drug in the past. Insurance is asking if she has severe hypertriglyceridemia, I see that she has hyperlipidemia but not the other one. Please advise on whether you want to proceed with the prior auth, but if we do they might deny based on the chart notes and labs I will have to submit.

## 2023-12-11 NOTE — Telephone Encounter (Signed)
 Patient does have a history of hypertriglyceridemia on the labs, it may not have been documented separately, sometimes we documented his mixed hyperlipidemia, I would go ahead and run the prior authorization for her unless she is buying them over-the-counter.

## 2023-12-14 ENCOUNTER — Telehealth: Payer: Self-pay

## 2023-12-14 NOTE — Telephone Encounter (Signed)
 PA request has been Submitted. New Encounter has been or will be created for follow up. For additional info see Pharmacy Prior Auth telephone encounter from 12/14/23.

## 2023-12-14 NOTE — Telephone Encounter (Signed)
 Pharmacy Patient Advocate Encounter  Received notification from [facility] that Prior Authorization for {OMEGA-3-ACID ETHYL ESTERS CAPS has been DENIED.  Full denial letter will be uploaded to the media tab. See denial reason below.

## 2023-12-14 NOTE — Telephone Encounter (Signed)
 Pharmacy Patient Advocate Encounter   Received notification from Pt Calls Messages that prior authorization for Omega-3-acid Ethyl Esters 1GM capsules is required/requested.   Insurance verification completed.   The patient is insured through [facility] .   Per test claim: PA required; PA submitted to above mentioned insurance via CoverMyMeds Key/confirmation #/EOC [id number] Status is pending

## 2023-12-15 DIAGNOSIS — H353111 Nonexudative age-related macular degeneration, right eye, early dry stage: Secondary | ICD-10-CM | POA: Diagnosis not present

## 2023-12-15 DIAGNOSIS — H35352 Cystoid macular degeneration, left eye: Secondary | ICD-10-CM | POA: Diagnosis not present

## 2023-12-15 DIAGNOSIS — M5416 Radiculopathy, lumbar region: Secondary | ICD-10-CM | POA: Diagnosis not present

## 2023-12-15 DIAGNOSIS — H353123 Nonexudative age-related macular degeneration, left eye, advanced atrophic without subfoveal involvement: Secondary | ICD-10-CM | POA: Diagnosis not present

## 2023-12-15 DIAGNOSIS — H35072 Retinal telangiectasis, left eye: Secondary | ICD-10-CM | POA: Diagnosis not present

## 2023-12-15 LAB — HM DIABETES EYE EXAM

## 2023-12-21 DIAGNOSIS — M9903 Segmental and somatic dysfunction of lumbar region: Secondary | ICD-10-CM | POA: Diagnosis not present

## 2023-12-21 DIAGNOSIS — M9901 Segmental and somatic dysfunction of cervical region: Secondary | ICD-10-CM | POA: Diagnosis not present

## 2023-12-21 DIAGNOSIS — M9904 Segmental and somatic dysfunction of sacral region: Secondary | ICD-10-CM | POA: Diagnosis not present

## 2023-12-21 DIAGNOSIS — M62830 Muscle spasm of back: Secondary | ICD-10-CM | POA: Diagnosis not present

## 2023-12-23 NOTE — Telephone Encounter (Signed)
 We will have to discuss at her next visit and see if we can find one that is covered by her insurance but a lot of times people have to buy it over-the-counter

## 2023-12-23 NOTE — Telephone Encounter (Signed)
 Pt made aware and will purchast OTC Omega 3. Reviewed urine test results as well.

## 2023-12-28 DIAGNOSIS — M9901 Segmental and somatic dysfunction of cervical region: Secondary | ICD-10-CM | POA: Diagnosis not present

## 2023-12-28 DIAGNOSIS — M62830 Muscle spasm of back: Secondary | ICD-10-CM | POA: Diagnosis not present

## 2023-12-28 DIAGNOSIS — M9903 Segmental and somatic dysfunction of lumbar region: Secondary | ICD-10-CM | POA: Diagnosis not present

## 2023-12-28 DIAGNOSIS — M9904 Segmental and somatic dysfunction of sacral region: Secondary | ICD-10-CM | POA: Diagnosis not present

## 2024-01-04 DIAGNOSIS — M62830 Muscle spasm of back: Secondary | ICD-10-CM | POA: Diagnosis not present

## 2024-01-04 DIAGNOSIS — M9903 Segmental and somatic dysfunction of lumbar region: Secondary | ICD-10-CM | POA: Diagnosis not present

## 2024-01-04 DIAGNOSIS — M9904 Segmental and somatic dysfunction of sacral region: Secondary | ICD-10-CM | POA: Diagnosis not present

## 2024-01-04 DIAGNOSIS — M9901 Segmental and somatic dysfunction of cervical region: Secondary | ICD-10-CM | POA: Diagnosis not present

## 2024-01-11 DIAGNOSIS — M9903 Segmental and somatic dysfunction of lumbar region: Secondary | ICD-10-CM | POA: Diagnosis not present

## 2024-01-11 DIAGNOSIS — M9901 Segmental and somatic dysfunction of cervical region: Secondary | ICD-10-CM | POA: Diagnosis not present

## 2024-01-11 DIAGNOSIS — M9904 Segmental and somatic dysfunction of sacral region: Secondary | ICD-10-CM | POA: Diagnosis not present

## 2024-01-11 DIAGNOSIS — M62830 Muscle spasm of back: Secondary | ICD-10-CM | POA: Diagnosis not present

## 2024-01-18 DIAGNOSIS — M9901 Segmental and somatic dysfunction of cervical region: Secondary | ICD-10-CM | POA: Diagnosis not present

## 2024-01-18 DIAGNOSIS — M62830 Muscle spasm of back: Secondary | ICD-10-CM | POA: Diagnosis not present

## 2024-01-18 DIAGNOSIS — M9903 Segmental and somatic dysfunction of lumbar region: Secondary | ICD-10-CM | POA: Diagnosis not present

## 2024-01-18 DIAGNOSIS — M9904 Segmental and somatic dysfunction of sacral region: Secondary | ICD-10-CM | POA: Diagnosis not present

## 2024-01-26 DIAGNOSIS — M79674 Pain in right toe(s): Secondary | ICD-10-CM | POA: Diagnosis not present

## 2024-01-26 DIAGNOSIS — E1142 Type 2 diabetes mellitus with diabetic polyneuropathy: Secondary | ICD-10-CM | POA: Diagnosis not present

## 2024-01-26 DIAGNOSIS — M79675 Pain in left toe(s): Secondary | ICD-10-CM | POA: Diagnosis not present

## 2024-01-26 DIAGNOSIS — L84 Corns and callosities: Secondary | ICD-10-CM | POA: Diagnosis not present

## 2024-01-26 DIAGNOSIS — B351 Tinea unguium: Secondary | ICD-10-CM | POA: Diagnosis not present

## 2024-01-27 DIAGNOSIS — M62830 Muscle spasm of back: Secondary | ICD-10-CM | POA: Diagnosis not present

## 2024-01-27 DIAGNOSIS — M9904 Segmental and somatic dysfunction of sacral region: Secondary | ICD-10-CM | POA: Diagnosis not present

## 2024-01-27 DIAGNOSIS — M9903 Segmental and somatic dysfunction of lumbar region: Secondary | ICD-10-CM | POA: Diagnosis not present

## 2024-01-27 DIAGNOSIS — M9901 Segmental and somatic dysfunction of cervical region: Secondary | ICD-10-CM | POA: Diagnosis not present

## 2024-02-03 DIAGNOSIS — M9903 Segmental and somatic dysfunction of lumbar region: Secondary | ICD-10-CM | POA: Diagnosis not present

## 2024-02-03 DIAGNOSIS — M9904 Segmental and somatic dysfunction of sacral region: Secondary | ICD-10-CM | POA: Diagnosis not present

## 2024-02-03 DIAGNOSIS — M9901 Segmental and somatic dysfunction of cervical region: Secondary | ICD-10-CM | POA: Diagnosis not present

## 2024-02-03 DIAGNOSIS — M62830 Muscle spasm of back: Secondary | ICD-10-CM | POA: Diagnosis not present

## 2024-02-09 DIAGNOSIS — M9903 Segmental and somatic dysfunction of lumbar region: Secondary | ICD-10-CM | POA: Diagnosis not present

## 2024-02-09 DIAGNOSIS — M62830 Muscle spasm of back: Secondary | ICD-10-CM | POA: Diagnosis not present

## 2024-02-09 DIAGNOSIS — M9901 Segmental and somatic dysfunction of cervical region: Secondary | ICD-10-CM | POA: Diagnosis not present

## 2024-02-09 DIAGNOSIS — M9904 Segmental and somatic dysfunction of sacral region: Secondary | ICD-10-CM | POA: Diagnosis not present

## 2024-02-16 DIAGNOSIS — M9903 Segmental and somatic dysfunction of lumbar region: Secondary | ICD-10-CM | POA: Diagnosis not present

## 2024-02-16 DIAGNOSIS — M9904 Segmental and somatic dysfunction of sacral region: Secondary | ICD-10-CM | POA: Diagnosis not present

## 2024-02-16 DIAGNOSIS — M62830 Muscle spasm of back: Secondary | ICD-10-CM | POA: Diagnosis not present

## 2024-02-16 DIAGNOSIS — M9901 Segmental and somatic dysfunction of cervical region: Secondary | ICD-10-CM | POA: Diagnosis not present

## 2024-02-22 ENCOUNTER — Encounter: Payer: Self-pay | Admitting: Nurse Practitioner

## 2024-02-22 ENCOUNTER — Ambulatory Visit (INDEPENDENT_AMBULATORY_CARE_PROVIDER_SITE_OTHER): Admitting: Nurse Practitioner

## 2024-02-22 VITALS — BP 112/68 | HR 64 | Temp 97.0°F | Ht 59.0 in | Wt 155.4 lb

## 2024-02-22 DIAGNOSIS — R300 Dysuria: Secondary | ICD-10-CM | POA: Diagnosis not present

## 2024-02-22 DIAGNOSIS — N3001 Acute cystitis with hematuria: Secondary | ICD-10-CM | POA: Insufficient documentation

## 2024-02-22 LAB — URINALYSIS, ROUTINE W REFLEX MICROSCOPIC
Bilirubin, UA: NEGATIVE
Glucose, UA: NEGATIVE
Nitrite, UA: NEGATIVE
Specific Gravity, UA: 1.025 (ref 1.005–1.030)
Urobilinogen, Ur: 0.2 mg/dL (ref 0.2–1.0)
pH, UA: 6 (ref 5.0–7.5)

## 2024-02-22 LAB — MICROSCOPIC EXAMINATION
Epithelial Cells (non renal): NONE SEEN /HPF (ref 0–10)
RBC, Urine: >30 /HPF — AB (ref 0–2)
Renal Epithel, UA: NONE SEEN /HPF
WBC, UA: >30 /HPF — AB (ref 0–5)

## 2024-02-22 MED ORDER — DOXYCYCLINE HYCLATE 100 MG PO CAPS: 100.0000 mg | ORAL_CAPSULE | Freq: Two times a day (BID) | ORAL | 0 refills | Status: DC

## 2024-02-22 NOTE — Progress Notes (Signed)
 Acute Office Visit  Subjective:     Patient ID: Misty Smith, female    DOB: [birth year], 83 y.o.   MRN: [id number]  Chief Complaint  Patient presents with   Dysuria    Symptoms started Friday    Back Pain   Urinary Frequency   Misty Smith is a 83 yrs old female prsents 02/22/2024 for acute viist concenrs for UTI Dysuria  Associated symptoms include frequency. Pertinent negatives include no chills, nausea or vomiting.  Back Pain Associated symptoms include dysuria. Pertinent negatives include no chest pain, fever or headaches.  Urinary Frequency  Associated symptoms include frequency. Pertinent negatives include no chills, nausea or vomiting.  . Urine dipstick shows positive for 3+RBC'Smith, positive for protein 3+, positive for leukocytes, and positive for ketones trace.  Micro exam: >30 WBC'Smith per HPF, >30 RBC'Smith per HPF, and many+ bacteria.  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   CAD (coronary artery disease) 12/30/2013   Vitamin D  deficiency 03/18/2016   Type 2 diabetes mellitus with other specified complication (HCC) 01/30/2021   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   Upper airway cough syndrome 02/08/2021   Impingement syndrome of shoulder region 09/26/2017   Lumbar radiculopathy 01/11/2021   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   Osteoarthritis of knee 09/11/2021   Sensorineural hearing loss, 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   Dyslipidemia 12/08/2023   Acute cystitis with hematuria 02/22/2024   Dysuria 02/22/2024   Resolved Ambulatory Problems     Diagnosis Date Noted   Pre-diabetes 02/01/2009   SYNCOPE 02/02/2009   DIZZINESS 02/01/2009   Palpitations 02/01/2009   Gall bladder disease 03/02/2014   Coronary artery disease due to lipid rich plaque 03/18/2016   Anxiety state 03/18/2016   Diabetes mellitus without complication (HCC) 01/16/2020   Choroidal nevus, 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   Pseudophakia of both eyes 01/30/2021   Pain in left knee 09/21/2019   Tear of medial meniscus of knee 12/05/2019   Tear of right rotator cuff 01/12/2018   Past Medical History:  Diagnosis Date   Allergy    Anxiety    Arthritis    Bronchitis    Cataract    Colovaginal fistula    Depression    Dysrhythmia    Esophageal stricture    Fractured elbow 1970'Smith   H/O hiatal hernia    Hyperlipidemia    Hypertension    Osteopenia    Perimenopausal vasomotor symptoms    Postmenopausal HRT (hormone replacement therapy)    Stress    Subacute lupus erythematosus     Review of Systems  Constitutional:  Negative for chills and fever.  HENT:  Negative for congestion and sore throat.   Respiratory:  Negative for cough and wheezing.   Cardiovascular:  Negative for chest pain and leg swelling.  Gastrointestinal:  Negative for nausea and vomiting.  Genitourinary:  Positive for dysuria and frequency.  Musculoskeletal:  Positive for back pain.  Skin:  Negative for itching and rash.  Neurological:  Negative for dizziness and headaches.   Negative unless indicated in HPI    Objective:    BP 112/68   Pulse 64  Temp (!) 97 F (36.1 C) (Temporal)   Ht 4' 11 (1.499 m)   Wt 155 lb 6.4 oz (70.5 kg)   SpO2 97%   BMI 31.39 kg/m  BP Readings from Last 3 Encounters:  02/22/24 112/68  12/09/23 124/70  12/07/23 (!) 134/51   Wt Readings from Last 3 Encounters:  02/22/24 155 lb 6.4 oz (70.5 kg)  12/09/23 161 lb (73 kg)  12/07/23 161 lb (73 kg)      Physical Exam Vitals and nursing note reviewed.  Constitutional:      General: She is not in acute  distress. HENT:     Head: Normocephalic and atraumatic.     Nose: Nose normal.     Mouth/Throat:     Mouth: Mucous membranes are moist.   Eyes:     Extraocular Movements: Extraocular movements intact.     Conjunctiva/sclera: Conjunctivae normal.     Pupils: Pupils are equal, round, and reactive to light.    Cardiovascular:     Heart sounds: Normal heart sounds.  Pulmonary:     Effort: Pulmonary effort is normal.     Breath sounds: Normal breath sounds.  Abdominal:     Palpations: Abdomen is soft.     Tenderness: There is no abdominal tenderness.   Musculoskeletal:        General: Normal range of motion.   Skin:    General: Skin is warm and dry.     Findings: No rash.   Neurological:     Mental Status: She is alert and oriented to person, place, and time.     Gait: Gait is intact.     Comments: Use a gait fro ambulation with slow steady gait  Psychiatric:        Mood and Affect: Mood normal.        Behavior: Behavior normal.        Thought Content: Thought content normal.        Judgment: Judgment normal.     No results found for any visits on 02/22/24.      Assessment & Plan:  Acute cystitis with hematuria  Dysuria -     Urinalysis, Routine w reflex microscopic -     Doxycycline  Hyclate; Take 1 capsule (100 mg total) by mouth 2 (two) times daily.  Dispense: 14 capsule; Refill: 0 -     Urine Culture  Misty Smith is a 83 year old Caucasian female seen today for acute cystitis, no acute distress Will treat with broad-spectrum antibiotic Bactrim 1 tablet twice daily while waiting for culture results, problem order sent based on culture result and antibiotic may be needed  Increase hydration may use Pyridium OTC prn. Call or return to clinic prn if these symptoms worsen or fail to improve as anticipated.  The above assessment and management plan was discussed with the patient. The patient verbalized understanding of and has agreed to the management plan. Patient is  aware to call the clinic if they develop any new symptoms or if symptoms persist or worsen. Patient is aware when to return to the clinic for a follow-up visit. Patient educated on when it is appropriate to go to the emergency department.  Return if symptoms worsen or fail to improve.  Misty Kosek [place] Thompson, DNP [facility] [address] [place], [place] [address] [phone]  Note: This document was prepared by [employer] voice dictation technology and any errors that results from this process are unintentional.

## 2024-02-23 ENCOUNTER — Ambulatory Visit: Payer: Self-pay | Admitting: Nurse Practitioner

## 2024-02-23 DIAGNOSIS — N3001 Acute cystitis with hematuria: Secondary | ICD-10-CM

## 2024-02-23 DIAGNOSIS — R300 Dysuria: Secondary | ICD-10-CM

## 2024-02-25 ENCOUNTER — Ambulatory Visit: Payer: Self-pay

## 2024-02-25 LAB — URINE CULTURE

## 2024-02-25 MED ORDER — NITROFURANTOIN MONOHYD MACRO 100 MG PO CAPS: 100.0000 mg | ORAL_CAPSULE | Freq: Two times a day (BID) | ORAL | 0 refills | Status: DC

## 2024-02-25 NOTE — Telephone Encounter (Signed)
 FYI Only or Action Required?: Action required by provider: update on patient condition.  Patient was last seen in primary care on 02/22/2024 by Misty Morton [name] Nena, NP. Called Nurse Triage reporting Dysuria. Symptoms began several days ago. Interventions attempted: OTC medications: tylenol  and Prescription medications: doxycycline  . Symptoms are: gradually improving.  Triage Disposition: Home Care  Patient/caregiver understands and will follow disposition?: No, wishes to speak with PCP,     States dysuria is slightly better but still there.       Copied from CRM [id number]. Topic: Clinical - Medication Question >> Feb 25, 2024 11:13 AM [name] wrote: Reason for CRM:  [name] called and feels like doxycycline  (VIBRAMYCIN ) 100 MG capsule is not working good for her UTI. She would like a nurse to call her to discuss and see if another medication might be better. Her number is [phone]. Thanks Reason for Disposition  [1] Reasonable improvement on antibiotics AND [2] no fever  Answer Assessment - Initial Assessment Questions 1. MAIN SYMPTOM: What is the main symptom you are concerned about? (e.g., painful urination, urine frequency)     dysuria 2. BETTER-SAME-WORSE: Are you getting better, staying the same, or getting worse compared to how you felt at your last visit to the doctor (most recent medical visit)?     Little better 3. PAIN: How bad is the pain?  (e.g., Scale 1-10; mild, moderate, or severe)   - MILD (1-3): complains slightly about urination hurting   - MODERATE (4-7): interferes with normal activities     - SEVERE (8-10): excruciating, unwilling or unable to urinate because of the pain      4/10 4. FEVER: Do you have a fever? If Yes, ask: What is it, how was it measured, and when did it start?     no 5. OTHER SYMPTOMS: Do you have any other symptoms? (e.g., blood in the urine, flank pain, vaginal discharge)     Loose stools 6. DIAGNOSIS: When was the  UTI diagnosed? By whom? Was it a kidney infection, bladder infection or both?     Misty Smith, 02/22/24 7. ANTIBIOTIC: What antibiotic(s) are you taking? How many times per day?     doxycycline  8. ANTIBIOTIC - START DATE: When did you start taking the antibiotic?     02/23/24  Protocols used: Urinary Tract Infection on Antibiotic Follow-up Call - [facility]

## 2024-02-29 ENCOUNTER — Telehealth: Payer: Self-pay

## 2024-02-29 ENCOUNTER — Other Ambulatory Visit: Payer: Self-pay

## 2024-02-29 DIAGNOSIS — E1159 Type 2 diabetes mellitus with other circulatory complications: Secondary | ICD-10-CM

## 2024-02-29 DIAGNOSIS — E1169 Type 2 diabetes mellitus with other specified complication: Secondary | ICD-10-CM

## 2024-02-29 NOTE — Telephone Encounter (Signed)
 Pt made aware to completed Macrobid , increase fluid intake. Avoid sugary juices and sodas

## 2024-02-29 NOTE — Telephone Encounter (Signed)
 Copied from CRM [id number]. Topic: Clinical - Medical Advice >> Feb 29, 2024 12:32 PM Emylou G wrote: Reason for CRM: Patient called.. said both meds for UTI hasn't worked.. does she need a urologist or another medicine.SABRA

## 2024-03-02 ENCOUNTER — Other Ambulatory Visit

## 2024-03-02 DIAGNOSIS — E1159 Type 2 diabetes mellitus with other circulatory complications: Secondary | ICD-10-CM

## 2024-03-02 DIAGNOSIS — E1169 Type 2 diabetes mellitus with other specified complication: Secondary | ICD-10-CM

## 2024-03-02 DIAGNOSIS — I152 Hypertension secondary to endocrine disorders: Secondary | ICD-10-CM | POA: Diagnosis not present

## 2024-03-02 DIAGNOSIS — E785 Hyperlipidemia, unspecified: Secondary | ICD-10-CM | POA: Diagnosis not present

## 2024-03-02 LAB — CBC WITH DIFFERENTIAL/PLATELET
Basophils Absolute: 0 10*3/uL (ref 0.0–0.2)
Basos: 1 %
EOS (ABSOLUTE): 0.1 10*3/uL (ref 0.0–0.4)
Eos: 1 %
Hematocrit: 38.8 % (ref 34.0–46.6)
Hemoglobin: 12.4 g/dL (ref 11.1–15.9)
Immature Grans (Abs): 0 10*3/uL (ref 0.0–0.1)
Immature Granulocytes: 0 %
Lymphocytes Absolute: 2.1 10*3/uL (ref 0.7–3.1)
Lymphs: 27 %
MCH: 31.8 pg (ref 26.6–33.0)
MCHC: 32 g/dL (ref 31.5–35.7)
MCV: 100 fL — ABNORMAL HIGH (ref 79–97)
Monocytes Absolute: 1 10*3/uL — ABNORMAL HIGH (ref 0.1–0.9)
Monocytes: 12 %
Neutrophils Absolute: 4.6 10*3/uL (ref 1.4–7.0)
Neutrophils: 59 %
Platelets: 248 10*3/uL (ref 150–450)
RBC: 3.9 x10E6/uL (ref 3.77–5.28)
RDW: 12.7 % (ref 11.7–15.4)
WBC: 7.8 10*3/uL (ref 3.4–10.8)

## 2024-03-02 LAB — LIPID PANEL
Chol/HDL Ratio: 2.9 ratio (ref 0.0–4.4)
Cholesterol, Total: 138 mg/dL (ref 100–199)
HDL: 47 mg/dL (ref >39)
LDL Chol Calc (NIH): 61 mg/dL (ref 0–99)
Triglycerides: 179 mg/dL — ABNORMAL HIGH (ref 0–149)
VLDL Cholesterol Cal: 30 mg/dL (ref 5–40)

## 2024-03-02 LAB — CMP14+EGFR
ALT: 20 IU/L (ref 0–32)
AST: 28 IU/L (ref 0–40)
Albumin: 4 g/dL (ref 3.7–4.7)
Alkaline Phosphatase: 79 IU/L (ref 44–121)
BUN/Creatinine Ratio: 25 (ref 12–28)
BUN: 21 mg/dL (ref 8–27)
Bilirubin Total: 0.5 mg/dL (ref 0.0–1.2)
CO2: 20 mmol/L (ref 20–29)
Calcium: 9.8 mg/dL (ref 8.7–10.3)
Chloride: 98 mmol/L (ref 96–106)
Creatinine, Ser: 0.83 mg/dL (ref 0.57–1.00)
Globulin, Total: 2.7 g/dL (ref 1.5–4.5)
Glucose: 121 mg/dL — ABNORMAL HIGH (ref 70–99)
Potassium: 4.5 mmol/L (ref 3.5–5.2)
Sodium: 136 mmol/L (ref 134–144)
Total Protein: 6.7 g/dL (ref 6.0–8.5)
eGFR: 69 mL/min/{1.73_m2} (ref >59)

## 2024-03-02 LAB — BAYER DCA HB A1C WAIVED: HB A1C (BAYER DCA - WAIVED): 6.1 % — ABNORMAL HIGH (ref 4.8–5.6)

## 2024-03-03 ENCOUNTER — Ambulatory Visit: Payer: Self-pay | Admitting: Family Medicine

## 2024-03-07 ENCOUNTER — Encounter: Payer: Self-pay | Admitting: Family Medicine

## 2024-03-07 ENCOUNTER — Ambulatory Visit: Admitting: Family Medicine

## 2024-03-07 VITALS — BP 113/59 | HR 61 | Ht 59.0 in | Wt 155.0 lb

## 2024-03-07 DIAGNOSIS — E1169 Type 2 diabetes mellitus with other specified complication: Secondary | ICD-10-CM

## 2024-03-07 DIAGNOSIS — E785 Hyperlipidemia, unspecified: Secondary | ICD-10-CM

## 2024-03-07 DIAGNOSIS — I2510 Atherosclerotic heart disease of native coronary artery without angina pectoris: Secondary | ICD-10-CM

## 2024-03-07 DIAGNOSIS — E1159 Type 2 diabetes mellitus with other circulatory complications: Secondary | ICD-10-CM | POA: Diagnosis not present

## 2024-03-07 DIAGNOSIS — I152 Hypertension secondary to endocrine disorders: Secondary | ICD-10-CM | POA: Diagnosis not present

## 2024-03-07 DIAGNOSIS — Z7984 Long term (current) use of oral hypoglycemic drugs: Secondary | ICD-10-CM | POA: Diagnosis not present

## 2024-03-07 NOTE — Progress Notes (Signed)
 BP (!) 113/59   Pulse 61   Ht 4' 11 (1.499 m)   Wt 155 lb (70.3 kg)   SpO2 97%   BMI 31.31 kg/m    Subjective:   Patient ID: Misty Smith, female    DOB: [birth year], 83 y.o.   MRN: [id number]  HPI: Misty GILKISON is a 83 y.o. female presenting on 03/07/2024 for Medical Management of Chronic Issues, Diabetes, Hyperlipidemia, and Hypertension   HPI Type 2 diabetes mellitus Patient comes in today for recheck of his diabetes. Patient has been currently taking Actos . Patient is currently on an ACE inhibitor/ARB. Patient has seen an ophthalmologist this year. Patient denies any new issues with their feet. The symptom started onset as an adult hypertension and hyperlipidemia and CAD ARE RELATED TO DM   Hypertension Patient is currently on amlodipine  and hydrochlorothiazide  and irbesartan  and metoprolol , and their blood pressure today is 113/59. Patient denies any lightheadedness or dizziness. Patient denies headaches, blurred vision, chest pains, shortness of breath, or weakness. Denies any side effects from medication and is content with current medication.   Hyperlipidemia and CAD recheck Patient is coming in for recheck of his hyperlipidemia. The patient is currently taking atorvastatin  and aspirin . They deny any issues with myalgias or history of liver damage from it. They deny any focal numbness or weakness or chest pain.   Relevant past medical, surgical, family and social history reviewed and updated as indicated. Interim medical history since our last visit reviewed. Allergies and medications reviewed and updated.  Review of Systems  Constitutional:  Negative for chills and fever.  HENT:  Negative for congestion, ear discharge and ear pain.   Eyes:  Negative for redness and visual disturbance.  Respiratory:  Negative for chest tightness and shortness of breath.   Cardiovascular:  Negative for chest pain and leg swelling.  Genitourinary:  Negative for difficulty urinating and  dysuria.  Musculoskeletal:  Negative for back pain and gait problem.  Skin:  Negative for rash.  Neurological:  Negative for dizziness, light-headedness and headaches.  Psychiatric/Behavioral:  Negative for agitation and behavioral problems.   All other systems reviewed and are negative.   Per HPI unless specifically indicated above   Allergies as of 03/07/2024       Reactions   Ciprofloxacin Rash   Codeine Rash   Iohexol      Desc: PT HAD HIVES AND WAS GIVEN BENEDRYL PO BY NURSE AND OBSERVED IN NURSES STATION AND RELEASED WITHOUT AND OTHER COMPLICATIONS SHEET SCANNED UNDER NURSES NOTES///ON 12/01/11 PT HAD ANOTHER CT WITH PREMEDS MINUS THE BENADRYL. SHE HAD A DELAYED REACTION AFTER WITH THROAT TIGHTNESS AND SOB, JB  12/07/12   Penicillins Hives   Sulfonamide Derivatives Hives   Asa [aspirin ] Other (See Comments)   Large amounts causes stomach pain   Keflex  [cephalexin ] Nausea And Vomiting   Morphine And Codeine Rash        Medication List        Accurate as of March 07, 2024 12:47 PM. If you have any questions, ask your nurse or doctor.          STOP taking these medications    doxycycline  100 MG capsule Commonly known as: VIBRAMYCIN  Stopped by: [facility] Amelianna Meller   omega-3 acid ethyl esters 1 g capsule Commonly known as: LOVAZA  Stopped by: [facility] Amali Uhls       TAKE these medications    amLODipine  10 MG tablet Commonly known as: NORVASC  Take 1 tablet (10 mg  total) by mouth daily.   aspirin  EC 81 MG tablet Take 1 tablet (81 mg total) by mouth daily.   atorvastatin  40 MG tablet Commonly known as: LIPITOR Take 1 tablet (40 mg total) by mouth at bedtime.   betamethasone  dipropionate 0.05 % cream Apply topically.   Calcium  Carb-Cholecalciferol 600-800 MG-UNIT Tabs Take 1 tablet by mouth every evening.   cholecalciferol 1000 units tablet Commonly known as: VITAMIN D  Take 2,000 Units by mouth daily.   Clinpro 5000 1.1 % Pste Generic drug: Sodium  Fluoride Place onto teeth 2 (two) times daily.   escitalopram  20 MG tablet Commonly known as: LEXAPRO  Take 1 tablet (20 mg total) by mouth daily.   esomeprazole  40 MG capsule Commonly known as: NEXIUM  Take 1 capsule (40 mg total) by mouth daily.   hydrochlorothiazide  12.5 MG tablet Commonly known as: HYDRODIURIL  Take 1 tablet (12.5 mg total) by mouth daily.   HYDROcodone -acetaminophen  10-325 MG tablet Commonly known as: NORCO Take 1 tablet by mouth 3 (three) times daily as needed.   hydrocortisone 2.5 % cream PLEASE SEE ATTACHED FOR DETAILED DIRECTIONS   irbesartan  75 MG tablet Commonly known as: AVAPRO  Take 1 tablet (75 mg total) by mouth daily.   ketoconazole 2 % cream Commonly known as: NIZORAL Apply topically 2 (two) times daily as needed.   metoprolol  tartrate 25 MG tablet Commonly known as: LOPRESSOR  Take 12.5 mg by mouth at bedtime.   multivitamin tablet Take 1 tablet by mouth every morning.   nitrofurantoin  (macrocrystal-monohydrate) 100 MG capsule Commonly known as: Macrobid  Take 1 capsule (100 mg total) by mouth 2 (two) times daily. 1 po BId   pioglitazone  30 MG tablet Commonly known as: ACTOS  Take 1 tablet (30 mg total) by mouth daily.         Objective:   BP (!) 113/59   Pulse 61   Ht 4' 11 (1.499 m)   Wt 155 lb (70.3 kg)   SpO2 97%   BMI 31.31 kg/m   Wt Readings from Last 3 Encounters:  03/07/24 155 lb (70.3 kg)  02/22/24 155 lb 6.4 oz (70.5 kg)  12/09/23 161 lb (73 kg)    Physical Exam Vitals and nursing note reviewed.  Constitutional:      General: She is not in acute distress.    Appearance: She is well-developed. She is not diaphoretic.  Eyes:     Conjunctiva/sclera: Conjunctivae normal.  Cardiovascular:     Rate and Rhythm: Normal rate and regular rhythm.     Heart sounds: Normal heart sounds. No murmur heard. Pulmonary:     Effort: Pulmonary effort is normal. No respiratory distress.     Breath sounds: Normal breath  sounds. No wheezing.  Musculoskeletal:        General: Swelling (1+ peripheral edema) present.  Skin:    General: Skin is warm and dry.     Findings: No rash.  Neurological:     Mental Status: She is alert and oriented to person, place, and time.     Coordination: Coordination normal.  Psychiatric:        Behavior: Behavior normal.     Results for orders placed or performed in visit on 03/02/24  Bayer DCA Hb A1c Waived   Collection Time: 03/02/24  9:38 AM  Result Value Ref Range   HB A1C (BAYER DCA - WAIVED) 6.1 (H) 4.8 - 5.6 %  Lipid panel   Collection Time: 03/02/24  9:41 AM  Result Value Ref Range   Cholesterol,  Total 138 100 - 199 mg/dL   Triglycerides 820 (H) 0 - 149 mg/dL   HDL 47 >60 mg/dL   VLDL Cholesterol Cal 30 5 - 40 mg/dL   LDL Chol Calc (NIH) 61 0 - 99 mg/dL   Chol/HDL Ratio 2.9 0.0 - 4.4 ratio  CMP14+EGFR   Collection Time: 03/02/24  9:41 AM  Result Value Ref Range   Glucose 121 (H) 70 - 99 mg/dL   BUN 21 8 - 27 mg/dL   Creatinine, Ser 9.16 0.57 - 1.00 mg/dL   eGFR 69 >40 fO/fpw/8.26   BUN/Creatinine Ratio 25 12 - 28   Sodium 136 134 - 144 mmol/L   Potassium 4.5 3.5 - 5.2 mmol/L   Chloride 98 96 - 106 mmol/L   CO2 20 20 - 29 mmol/L   Calcium  9.8 8.7 - 10.3 mg/dL   Total Protein 6.7 6.0 - 8.5 g/dL   Albumin 4.0 3.7 - 4.7 g/dL   Globulin, Total 2.7 1.5 - 4.5 g/dL   Bilirubin Total 0.5 0.0 - 1.2 mg/dL   Alkaline Phosphatase 79 44 - 121 IU/L   AST 28 0 - 40 IU/L   ALT 20 0 - 32 IU/L  CBC with Differential/Platelet   Collection Time: 03/02/24  9:41 AM  Result Value Ref Range   WBC 7.8 3.4 - 10.8 x10E3/uL   RBC 3.90 3.77 - 5.28 x10E6/uL   Hemoglobin 12.4 11.1 - 15.9 g/dL   Hematocrit 61.1 65.9 - 46.6 %   MCV 100 (H) 79 - 97 fL   MCH 31.8 26.6 - 33.0 pg   MCHC 32.0 31.5 - 35.7 g/dL   RDW 87.2 88.2 - 84.5 %   Platelets 248 150 - 450 x10E3/uL   Neutrophils 59 Not Estab. %   Lymphs 27 Not Estab. %   Monocytes 12 Not Estab. %   Eos 1 Not Estab. %    Basos 1 Not Estab. %   Neutrophils Absolute 4.6 1.4 - 7.0 x10E3/uL   Lymphocytes Absolute 2.1 0.7 - 3.1 x10E3/uL   Monocytes Absolute 1.0 (H) 0.1 - 0.9 x10E3/uL   EOS (ABSOLUTE) 0.1 0.0 - 0.4 x10E3/uL   Basophils Absolute 0.0 0.0 - 0.2 x10E3/uL   Immature Granulocytes 0 Not Estab. %   Immature Grans (Abs) 0.0 0.0 - 0.1 x10E3/uL    Assessment & Plan:   Problem List Items Addressed This Visit       Cardiovascular and Mediastinum   Hypertension associated with type 2 diabetes mellitus (HCC)   CAD (coronary artery disease)     Endocrine   Hyperlipidemia associated with type 2 diabetes mellitus (HCC) - Primary   Type 2 diabetes mellitus with other specified complication (HCC)   Relevant Orders   Bayer DCA Hb A1c Waived    A1c was up slightly but still in a good range at 6.1.  Blood pressure and everything else looks good today.  Triglycerides were up slightly, mentioned to focus on diet and activity and she is going to try that. Follow up plan: Return in about 3 months (around 06/07/2024), or if symptoms worsen or fail to improve, for Diabetes and hypertension and cholesterol.  Counseling provided for all of the vaccine components Orders Placed This Encounter  Procedures   Bayer DCA Hb A1c Waived    Fonda Levins, MD [facility] 03/07/2024, 12:47 PM

## 2024-03-08 DIAGNOSIS — M9901 Segmental and somatic dysfunction of cervical region: Secondary | ICD-10-CM | POA: Diagnosis not present

## 2024-03-08 DIAGNOSIS — M9904 Segmental and somatic dysfunction of sacral region: Secondary | ICD-10-CM | POA: Diagnosis not present

## 2024-03-08 DIAGNOSIS — M9903 Segmental and somatic dysfunction of lumbar region: Secondary | ICD-10-CM | POA: Diagnosis not present

## 2024-03-08 DIAGNOSIS — M62830 Muscle spasm of back: Secondary | ICD-10-CM | POA: Diagnosis not present

## 2024-03-15 DIAGNOSIS — M62830 Muscle spasm of back: Secondary | ICD-10-CM | POA: Diagnosis not present

## 2024-03-15 DIAGNOSIS — M9903 Segmental and somatic dysfunction of lumbar region: Secondary | ICD-10-CM | POA: Diagnosis not present

## 2024-03-15 DIAGNOSIS — M9904 Segmental and somatic dysfunction of sacral region: Secondary | ICD-10-CM | POA: Diagnosis not present

## 2024-03-15 DIAGNOSIS — M9901 Segmental and somatic dysfunction of cervical region: Secondary | ICD-10-CM | POA: Diagnosis not present

## 2024-03-22 ENCOUNTER — Ambulatory Visit

## 2024-03-22 ENCOUNTER — Encounter: Payer: Self-pay | Admitting: Family Medicine

## 2024-03-22 VITALS — BP 113/59 | HR 61 | Ht 59.0 in | Wt 155.0 lb

## 2024-03-22 DIAGNOSIS — Z0000 Encounter for general adult medical examination without abnormal findings: Secondary | ICD-10-CM | POA: Diagnosis not present

## 2024-03-22 DIAGNOSIS — M9903 Segmental and somatic dysfunction of lumbar region: Secondary | ICD-10-CM | POA: Diagnosis not present

## 2024-03-22 DIAGNOSIS — M9901 Segmental and somatic dysfunction of cervical region: Secondary | ICD-10-CM | POA: Diagnosis not present

## 2024-03-22 DIAGNOSIS — M62830 Muscle spasm of back: Secondary | ICD-10-CM | POA: Diagnosis not present

## 2024-03-22 DIAGNOSIS — M9904 Segmental and somatic dysfunction of sacral region: Secondary | ICD-10-CM | POA: Diagnosis not present

## 2024-03-22 NOTE — Patient Instructions (Signed)
 Misty Smith , Thank you for taking time out of your busy schedule to complete your Annual Wellness Visit with me. I enjoyed our conversation and look forward to speaking with you again next year. I, as well as your care team,  appreciate your ongoing commitment to your health goals. Please review the following plan we discussed and let me know if I can assist you in the future. Your Game plan/ To Do List    Follow up Visits: Next Medicare AWV with our clinical staff: 03/23/25 at 10:40a.m.   Have you seen your provider in the last 6 months (3 months if uncontrolled diabetes)? Yes Next Office Visit with your provider: 06/08/24 at 10:10a.m.  Clinician Recommendations:  Aim for 30 minutes of exercise or brisk walking, 6-8 glasses of water, and 5 servings of fruits and vegetables each day.       This is a list of the screening recommended for you and due dates:  Health Maintenance  Topic Date Due   Eye exam for diabetics  12/11/2023   Medicare Annual Wellness Visit  02/03/2024   Pap Smear  03/07/2025*   COVID-19 Vaccine (6 - 2024-25 season) 04/07/2025*   Flu Shot  04/01/2024   Mammogram  04/28/2024   Hemoglobin A1C  09/02/2024   Yearly kidney health urinalysis for diabetes  12/06/2024   Complete foot exam   12/06/2024   DEXA scan (bone density measurement)  12/07/2024   Yearly kidney function blood test for diabetes  03/02/2025   DTaP/Tdap/Td vaccine (2 - Td or Tdap) 10/11/2031   Pneumococcal Vaccine for age over 51  Completed   Zoster (Shingles) Vaccine  Completed   Hepatitis B Vaccine  Aged Out   HPV Vaccine  Aged Out   Meningitis B Vaccine  Aged Out  *Topic was postponed. The date shown is not the original due date.    Advanced directives: (Declined) Advance directive discussed with you today. Even though you declined this today, please call our office should you change your mind, and we can give you the proper paperwork for you to fill out. Advance Care Planning is important because  it:  [x]  Makes sure you receive the medical care that is consistent with your values, goals, and preferences  [x]  It provides guidance to your family and loved ones and reduces their decisional burden about whether or not they are making the right decisions based on your wishes.  Follow the link provided in your after visit summary or read over the paperwork we have mailed to you to help you started getting your Advance Directives in place. If you need assistance in completing these, please reach out to us  so that we can help you!  See attachments for Preventive Care and Fall Prevention Tips.

## 2024-03-22 NOTE — Progress Notes (Signed)
 Subjective:   Misty Smith is a 83 y.o. who presents for a Medicare Wellness preventive visit.  As a reminder, Annual Wellness Visits don't include a physical exam, and some assessments may be limited, especially if this visit is performed virtually. We may recommend an in-person follow-up visit with your provider if needed.  Visit Complete: Virtual I connected with  NAHDIA DOUCET on 03/22/24 by a audio enabled telemedicine application and verified that I am speaking with the correct person using two identifiers.  Patient Location: Home  Provider Location: Home Office  I discussed the limitations of evaluation and management by telemedicine. The patient expressed understanding and agreed to proceed.  Vital Signs: Because this visit was a virtual/telehealth visit, some criteria may be missing or patient reported. Any vitals not documented were not able to be obtained and vitals that have been documented are patient reported.  VideoDeclined- This patient declined Librarian, academic. Therefore the visit was completed with audio only.  Persons Participating in Visit: Patient.  AWV Questionnaire: No: Patient Medicare AWV questionnaire was not completed prior to this visit.  Cardiac Risk Factors include: advanced age (>33men, >66 women);diabetes mellitus;dyslipidemia;hypertension;obesity (BMI >30kg/m2)     Objective:    Today's Vitals   03/22/24 1401 03/22/24 1404  BP: (!) 113/59   Pulse: 61   Weight: 155 lb (70.3 kg)   Height: 4' 11 (1.499 m)   PainSc:  3    Body mass index is 31.31 kg/m.     03/22/2024    2:09 PM 09/29/2023    9:20 AM 02/20/2023    9:41 AM 02/03/2023    2:51 PM 06/18/2021    3:36 PM 06/03/2021    8:46 AM 06/29/2018    9:19 AM  Advanced Directives  Does Patient Have a Medical Advance Directive? No Yes Yes Yes Yes Yes Yes   Type of Theme park manager;Living will  Healthcare Power of  [place];Living will Healthcare Power of [place];Living will  Does patient want to make changes to medical advance directive?       No - Patient declined   Copy of Healthcare Power of Attorney in Chart?    No - copy requested  No - copy requested No - copy requested      Data saved with a previous flowsheet row definition    Current Medications (verified) Outpatient Encounter Medications as of 03/22/2024  Medication Sig   amLODipine  (NORVASC ) 10 MG tablet Take 1 tablet (10 mg total) by mouth daily.   aspirin  EC 81 MG tablet Take 1 tablet (81 mg total) by mouth daily.   atorvastatin  (LIPITOR) 40 MG tablet Take 1 tablet (40 mg total) by mouth at bedtime.   betamethasone  dipropionate 0.05 % cream Apply topically.   Calcium  Carb-Cholecalciferol 600-800 MG-UNIT TABS Take 1 tablet by mouth every evening.    cholecalciferol (VITAMIN D ) 1000 UNITS tablet Take 2,000 Units by mouth daily.   CLINPRO 5000 1.1 % PSTE Place onto teeth 2 (two) times daily.   escitalopram  (LEXAPRO ) 20 MG tablet Take 1 tablet (20 mg total) by mouth daily.   esomeprazole  (NEXIUM ) 40 MG capsule Take 1 capsule (40 mg total) by mouth daily.   hydrochlorothiazide  (HYDRODIURIL ) 12.5 MG tablet Take 1 tablet (12.5 mg total) by mouth daily.   HYDROcodone -acetaminophen  (NORCO) 10-325 MG tablet Take 1 tablet by mouth 3 (three) times daily as needed.   hydrocortisone 2.5 % cream PLEASE SEE ATTACHED FOR DETAILED  DIRECTIONS   irbesartan  (AVAPRO ) 75 MG tablet Take 1 tablet (75 mg total) by mouth daily.   ketoconazole (NIZORAL) 2 % cream Apply topically 2 (two) times daily as needed.   metoprolol  tartrate (LOPRESSOR ) 25 MG tablet Take 12.5 mg by mouth at bedtime.   Multiple Vitamin (MULTIVITAMIN) tablet Take 1 tablet by mouth every morning.    nitrofurantoin , macrocrystal-monohydrate, (MACROBID ) 100 MG capsule Take 1 capsule (100 mg total) by mouth 2 (two) times daily. 1 po BId   pioglitazone  (ACTOS ) 30 MG tablet Take 1 tablet (30 mg  total) by mouth daily.   No facility-administered encounter medications on file as of 03/22/2024.    Allergies (verified) Ciprofloxacin, Codeine, Iohexol , Penicillins, Sulfonamide derivatives, Asa [aspirin ], Keflex  [cephalexin ], and Morphine and codeine   History: Past Medical History:  Diagnosis Date   Allergy    Anxiety    Arthritis    Bronchitis    Cataract    Colovaginal fistula    Depression    Diabetes mellitus without complication (HCC)    Dysrhythmia    palpitations, occasional PVCs; Dr Pietro cardiologist   Esophageal stricture    Fractured elbow 1970's   GERD (gastroesophageal reflux disease)    H/O hiatal hernia    Hyperlipidemia    Hypertension    Osteopenia    Perimenopausal vasomotor symptoms    Postmenopausal HRT (hormone replacement therapy)    Pre-diabetes    Stress    Subacute lupus erythematosus    Dr. ivin   Past Surgical History:  Procedure Laterality Date   abdominal bypass  1987   fibroids    ABDOMINAL HYSTERECTOMY     partial   bilateral tubal ligtation  1972   CHOLECYSTECTOMY N/A 03/21/2014   Procedure: LAPAROSCOPIC CHOLECYSTECTOMY WITH INTRAOPERATIVE CHOLANGIOGRAM;  Surgeon: Alm VEAR Angle, MD;  Location: [facility] OR;  Service: General;  Laterality: N/A;   COLONOSCOPY     elbow Right 1976   surgery after fracture of elbow   EYE SURGERY Bilateral    cataracts   ROTATOR CUFF REPAIR Right 03/29/2018   TONSILLECTOMY     TUBAL LIGATION     Family History  Problem Relation Age of Onset   Cancer Mother        laryngeal    Hypertension Mother    Heart disease Mother    Hypertension Father    Coronary artery disease Father    Kidney disease Father        kidney failure    Breast cancer Sister    Hypertension Sister    Cancer Sister        breast   Heart attack Sister    Heart disease Sister    Stroke Sister    Stroke Sister    Hypertension Sister    Hyperlipidemia Sister    Hypertension Daughter    Hypertension Brother    Cancer  Brother        liver   Hypertension Brother    Heart attack Brother    Hypertension Son    Social History   Socioeconomic History   Marital status: Widowed    Spouse name: Not on file   Number of children: 4   Years of education: Not on file   Highest education level: Not on file  Occupational History   Occupation: retired     Comment: [employer]   Tobacco Use   Smoking status: Never   Smokeless tobacco: Never  Vaping Use   Vaping status: Never Used  Substance and Sexual Activity   Alcohol use: No    Alcohol/week: 0.0 standard drinks of alcohol   Drug use: No   Sexual activity: Never  Other Topics Concern   Not on file  Social History Narrative   Lives alone - son next door    Daughters in [place], one in [place], one in [place]   Social Drivers of Health   Financial Resource Strain: Low Risk  (03/22/2024)   Overall Financial Resource Strain (CARDIA)    Difficulty of Paying Living Expenses: Not hard at all  Food Insecurity: No Food Insecurity (03/22/2024)   Hunger Vital Sign    Worried About Running Out of Food in the Last Year: Never true    Ran Out of Food in the Last Year: Never true  Transportation Needs: No Transportation Needs (03/22/2024)   PRAPARE - Administrator, Civil Service (Medical): No    Lack of Transportation (Non-Medical): No  Physical Activity: Insufficiently Active (03/22/2024)   Exercise Vital Sign    Days of Exercise per Week: 2 days    Minutes of Exercise per Session: 10 min  Stress: No Stress Concern Present (03/22/2024)   [employer] of Occupational Health - Occupational Stress Questionnaire    Feeling of Stress: Only a little  Social Connections: Moderately Isolated (03/22/2024)   Social Connection and Isolation Panel    Frequency of Communication with Friends and Family: More than three times a week    Frequency of Social Gatherings with Friends and Family: More than three times a week    Attends Religious  Services: More than 4 times per year    Active Member of [employer] or Organizations: No    Attends Banker Meetings: Never    Marital Status: Widowed    Tobacco Counseling Counseling given: Yes    Clinical Intake:  Pre-visit preparation completed: Yes  Pain : 0-10 (back pain) Pain Score: 3  Pain Type: Chronic pain Pain Location: Back Pain Orientation: Lower Pain Descriptors / Indicators: Constant, Aching Pain Onset: More than a month ago Pain Frequency: Constant Pain Relieving Factors: pain meds  Pain Relieving Factors: pain meds  BMI - recorded: 31.31 Nutritional Status: BMI > 30  Obese Nutritional Risks: None Diabetes: No  Lab Results  Component Value Date   HGBA1C 6.1 (H) 03/02/2024   HGBA1C 5.7 (H) 12/02/2023   HGBA1C 6.3 (H) 08/04/2023     How often do you need to have someone help you when you read instructions, pamphlets, or other written materials from your doctor or pharmacy?: 1 - Never  Interpreter Needed?: No  Information entered by :: alia t/cma   Activities of Daily Living     03/22/2024    2:06 PM  In your present state of health, do you have any difficulty performing the following activities:  Hearing? 1  Vision? 0  Difficulty concentrating or making decisions? 0  Walking or climbing stairs? 1  Dressing or bathing? 0  Doing errands, shopping? 0  Preparing Food and eating ? N  Using the Toilet? N  In the past six months, have you accidently leaked urine? N  Do you have problems with loss of bowel control? N  Managing your Medications? N  Managing your Finances? N  Housekeeping or managing your Housekeeping? N    Patient Care Team: Dettinger, Fonda LABOR, MD as PCP - General (Family Medicine) Pietro Redell RAMAN, MD as PCP - Cardiology (Cardiology) Rosalie Kitchens, MD (Gastroenterology) Pietro Redell RAMAN, MD (  Cardiology) Watt Rush, MD (Urology) Ernie Cough, MD (Orthopedic Surgery) Octavia Charleston, MD as Consulting Physician  (Ophthalmology) Kay Kemps, MD as Consulting Physician (Orthopedic Surgery)  I have updated your Care Teams any recent Medical Services you may have received from other providers in the past year.     Assessment:   This is a routine wellness examination for [place].  Hearing/Vision screen Hearing Screening - Comments:: Pt use hearing aids Vision Screening - Comments:: Pt denies vision dif/only uses readers/pt goes to Dr. Blanche ov 12/24   Goals Addressed             This Visit's Progress    Exercise 3x per week (30 min per time)   On track    Do chair exercises at home daily. Check into exercise programs at the recreation department or [facility].        Depression Screen     03/22/2024    2:12 PM 03/07/2024   11:18 AM 12/07/2023    8:43 AM 09/04/2023    9:18 AM 08/06/2023    8:13 AM 06/17/2023    4:09 PM 04/06/2023    8:22 AM  PHQ 2/9 Scores  PHQ - 2 Score 0  0 0 0 0 0  PHQ- 9 Score 1    0  0  Exception Documentation  Patient refusal         Fall Risk     03/22/2024    2:04 PM 12/07/2023    8:42 AM 09/04/2023    9:18 AM 08/06/2023    8:13 AM 06/17/2023    4:09 PM  Fall Risk   Falls in the past year? 0 0 0 0 0  Number falls in past yr: 0 0     Injury with Fall? 0 0     Risk for fall due to : No Fall Risks Impaired balance/gait;Orthopedic patient     Follow up Falls evaluation completed Falls evaluation completed       MEDICARE RISK AT HOME:  Medicare Risk at Home Any stairs in or around the home?: Yes If so, are there any without handrails?: Yes Home free of loose throw rugs in walkways, pet beds, electrical cords, etc?: Yes Adequate lighting in your home to reduce risk of falls?: Yes Life alert?: No Use of a cane, walker or w/c?: Yes Grab bars in the bathroom?: Yes Shower chair or bench in shower?: Yes Elevated toilet seat or a handicapped toilet?: No  TIMED UP AND GO:  Was the test performed?  no  Cognitive Function: 6CIT completed    06/29/2018    9:21  AM 04/29/2017   10:06 AM 04/22/2016    9:08 AM  MMSE - Mini Mental State Exam  Orientation to time 5 5  5    Orientation to Place 5 5  5    Registration 3 3  3    Attention/ Calculation 5 5  5    Recall 1 3  3    Language- name 2 objects 2 2  2    Language- repeat 1 1 1   Language- follow 3 step command 3 3  3    Language- read & follow direction 1 1  1    Write a sentence 1 1  1    Copy design 1 1  1    Total score 28 30  30       Data saved with a previous flowsheet row definition        03/22/2024    2:13 PM 02/03/2023    2:51  PM 06/03/2021    8:46 AM  6CIT Screen  What Year? 0 points 0 points 0 points  What month? 0 points 0 points 0 points  What time? 0 points 0 points 0 points  Count back from 20 0 points 0 points 0 points  Months in reverse 0 points 0 points 0 points  Repeat phrase 0 points 0 points 4 points  Total Score 0 points 0 points 4 points    Immunizations Immunization History  Administered Date(s) Administered   Fluad Quad(high Dose 65+) 06/21/2019, 06/19/2020, 06/30/2022   Fluad Trivalent(High Dose 65+) 08/06/2023   Influenza, High Dose Seasonal PF 06/22/2015, 05/26/2016, 06/04/2017, 06/15/2018, 07/01/2021   Influenza,inj,Quad PF,6+ Mos 06/08/2013, 06/14/2014   Influenza,inj,quad, With Preservative 06/02/2019   Moderna Covid-19 Fall Seasonal Vaccine 17yrs & older 07/08/2023   Moderna Sars-Covid-2 Vaccination 11/08/2019, 11/29/2019, 07/17/2020, 04/30/2021   Pneumococcal Conjugate-13 09/21/2013   Pneumococcal Polysaccharide-23 06/23/2019   Tdap 10/10/2021   Zoster Recombinant(Shingrix) 10/24/2021, 01/03/2022    Screening Tests Health Maintenance  Topic Date Due   OPHTHALMOLOGY EXAM  12/11/2023   Cervical Cancer Screening (Pap smear)  03/07/2025 (Originally 11/13/2016)   COVID-19 Vaccine (6 - 2024-25 season) 04/07/2025 (Originally 01/05/2024)   INFLUENZA VACCINE  04/01/2024   MAMMOGRAM  04/28/2024   HEMOGLOBIN A1C  09/02/2024   Diabetic kidney evaluation - Urine  ACR  12/06/2024   FOOT EXAM  12/06/2024   DEXA SCAN  12/07/2024   Diabetic kidney evaluation - eGFR measurement  03/02/2025   Medicare Annual Wellness (AWV)  03/22/2025   DTaP/Tdap/Td (2 - Td or Tdap) 10/11/2031   Pneumococcal Vaccine: 50+ Years  Completed   Zoster Vaccines- Shingrix  Completed   Hepatitis B Vaccines  Aged Out   HPV VACCINES  Aged Out   Meningococcal B Vaccine  Aged Out    Health Maintenance  Health Maintenance Due  Topic Date Due   OPHTHALMOLOGY EXAM  12/11/2023   Health Maintenance Items Addressed: See Nurse Notes at the end of this note  Additional Screening:  Vision Screening: Recommended annual ophthalmology exams for early detection of glaucoma and other disorders of the eye. Would you like a referral to an eye doctor? No    Dental Screening: Recommended annual dental exams for proper oral hygiene  Community Resource Referral / Chronic Care Management: CRR required this visit?  No   CCM required this visit?  No   Plan:    I have personally reviewed and noted the following in the patient's chart:   Medical and social history Use of alcohol, tobacco or illicit drugs  Current medications and supplements including opioid prescriptions. Patient is not currently taking opioid prescriptions. Functional ability and status Nutritional status Physical activity Advanced directives List of other physicians Hospitalizations, surgeries, and ER visits in previous 12 months Vitals Screenings to include cognitive, depression, and falls Referrals and appointments  In addition, I have reviewed and discussed with patient certain preventive protocols, quality metrics, and best practice recommendations. A written personalized care plan for preventive services as well as general preventive health recommendations were provided to patient.   Ozie Ned, CMA   03/22/2024   After Visit Summary: (MyChart) Due to this being a telephonic visit, the after visit  summary with patients personalized plan was offered to patient via MyChart   Notes: Nothing at this time to report.

## 2024-03-30 DIAGNOSIS — M5416 Radiculopathy, lumbar region: Secondary | ICD-10-CM | POA: Diagnosis not present

## 2024-04-01 DIAGNOSIS — L814 Other melanin hyperpigmentation: Secondary | ICD-10-CM | POA: Diagnosis not present

## 2024-04-01 DIAGNOSIS — L281 Prurigo nodularis: Secondary | ICD-10-CM | POA: Diagnosis not present

## 2024-04-01 DIAGNOSIS — L821 Other seborrheic keratosis: Secondary | ICD-10-CM | POA: Diagnosis not present

## 2024-04-01 DIAGNOSIS — D225 Melanocytic nevi of trunk: Secondary | ICD-10-CM | POA: Diagnosis not present

## 2024-04-05 DIAGNOSIS — M9904 Segmental and somatic dysfunction of sacral region: Secondary | ICD-10-CM | POA: Diagnosis not present

## 2024-04-05 DIAGNOSIS — M79674 Pain in right toe(s): Secondary | ICD-10-CM | POA: Diagnosis not present

## 2024-04-05 DIAGNOSIS — M9903 Segmental and somatic dysfunction of lumbar region: Secondary | ICD-10-CM | POA: Diagnosis not present

## 2024-04-05 DIAGNOSIS — E1142 Type 2 diabetes mellitus with diabetic polyneuropathy: Secondary | ICD-10-CM | POA: Diagnosis not present

## 2024-04-05 DIAGNOSIS — M9901 Segmental and somatic dysfunction of cervical region: Secondary | ICD-10-CM | POA: Diagnosis not present

## 2024-04-05 DIAGNOSIS — L84 Corns and callosities: Secondary | ICD-10-CM | POA: Diagnosis not present

## 2024-04-05 DIAGNOSIS — B351 Tinea unguium: Secondary | ICD-10-CM | POA: Diagnosis not present

## 2024-04-05 DIAGNOSIS — M79675 Pain in left toe(s): Secondary | ICD-10-CM | POA: Diagnosis not present

## 2024-04-05 DIAGNOSIS — M62830 Muscle spasm of back: Secondary | ICD-10-CM | POA: Diagnosis not present

## 2024-04-12 DIAGNOSIS — M9904 Segmental and somatic dysfunction of sacral region: Secondary | ICD-10-CM | POA: Diagnosis not present

## 2024-04-12 DIAGNOSIS — M62830 Muscle spasm of back: Secondary | ICD-10-CM | POA: Diagnosis not present

## 2024-04-12 DIAGNOSIS — M9901 Segmental and somatic dysfunction of cervical region: Secondary | ICD-10-CM | POA: Diagnosis not present

## 2024-04-12 DIAGNOSIS — M9903 Segmental and somatic dysfunction of lumbar region: Secondary | ICD-10-CM | POA: Diagnosis not present

## 2024-04-19 DIAGNOSIS — M9904 Segmental and somatic dysfunction of sacral region: Secondary | ICD-10-CM | POA: Diagnosis not present

## 2024-04-19 DIAGNOSIS — M62830 Muscle spasm of back: Secondary | ICD-10-CM | POA: Diagnosis not present

## 2024-04-19 DIAGNOSIS — M9903 Segmental and somatic dysfunction of lumbar region: Secondary | ICD-10-CM | POA: Diagnosis not present

## 2024-04-19 DIAGNOSIS — M9901 Segmental and somatic dysfunction of cervical region: Secondary | ICD-10-CM | POA: Diagnosis not present

## 2024-04-21 DIAGNOSIS — M170 Bilateral primary osteoarthritis of knee: Secondary | ICD-10-CM | POA: Diagnosis not present

## 2024-04-26 DIAGNOSIS — M9904 Segmental and somatic dysfunction of sacral region: Secondary | ICD-10-CM | POA: Diagnosis not present

## 2024-04-26 DIAGNOSIS — M9901 Segmental and somatic dysfunction of cervical region: Secondary | ICD-10-CM | POA: Diagnosis not present

## 2024-04-26 DIAGNOSIS — M9903 Segmental and somatic dysfunction of lumbar region: Secondary | ICD-10-CM | POA: Diagnosis not present

## 2024-04-26 DIAGNOSIS — M62830 Muscle spasm of back: Secondary | ICD-10-CM | POA: Diagnosis not present

## 2024-04-28 DIAGNOSIS — M170 Bilateral primary osteoarthritis of knee: Secondary | ICD-10-CM | POA: Diagnosis not present

## 2024-05-03 DIAGNOSIS — M9904 Segmental and somatic dysfunction of sacral region: Secondary | ICD-10-CM | POA: Diagnosis not present

## 2024-05-03 DIAGNOSIS — M62830 Muscle spasm of back: Secondary | ICD-10-CM | POA: Diagnosis not present

## 2024-05-03 DIAGNOSIS — M9901 Segmental and somatic dysfunction of cervical region: Secondary | ICD-10-CM | POA: Diagnosis not present

## 2024-05-03 DIAGNOSIS — M9903 Segmental and somatic dysfunction of lumbar region: Secondary | ICD-10-CM | POA: Diagnosis not present

## 2024-05-05 DIAGNOSIS — M170 Bilateral primary osteoarthritis of knee: Secondary | ICD-10-CM | POA: Diagnosis not present

## 2024-05-10 DIAGNOSIS — M9903 Segmental and somatic dysfunction of lumbar region: Secondary | ICD-10-CM | POA: Diagnosis not present

## 2024-05-10 DIAGNOSIS — M62830 Muscle spasm of back: Secondary | ICD-10-CM | POA: Diagnosis not present

## 2024-05-10 DIAGNOSIS — M9901 Segmental and somatic dysfunction of cervical region: Secondary | ICD-10-CM | POA: Diagnosis not present

## 2024-05-10 DIAGNOSIS — M9904 Segmental and somatic dysfunction of sacral region: Secondary | ICD-10-CM | POA: Diagnosis not present

## 2024-05-11 ENCOUNTER — Other Ambulatory Visit: Payer: Self-pay | Admitting: Family Medicine

## 2024-05-11 DIAGNOSIS — I10 Essential (primary) hypertension: Secondary | ICD-10-CM

## 2024-05-16 ENCOUNTER — Other Ambulatory Visit: Payer: Self-pay | Admitting: Family Medicine

## 2024-05-16 DIAGNOSIS — Z1231 Encounter for screening mammogram for malignant neoplasm of breast: Secondary | ICD-10-CM

## 2024-05-17 DIAGNOSIS — M9901 Segmental and somatic dysfunction of cervical region: Secondary | ICD-10-CM | POA: Diagnosis not present

## 2024-05-17 DIAGNOSIS — M62830 Muscle spasm of back: Secondary | ICD-10-CM | POA: Diagnosis not present

## 2024-05-17 DIAGNOSIS — M9904 Segmental and somatic dysfunction of sacral region: Secondary | ICD-10-CM | POA: Diagnosis not present

## 2024-05-17 DIAGNOSIS — M9903 Segmental and somatic dysfunction of lumbar region: Secondary | ICD-10-CM | POA: Diagnosis not present

## 2024-05-24 DIAGNOSIS — M9901 Segmental and somatic dysfunction of cervical region: Secondary | ICD-10-CM | POA: Diagnosis not present

## 2024-05-24 DIAGNOSIS — M9904 Segmental and somatic dysfunction of sacral region: Secondary | ICD-10-CM | POA: Diagnosis not present

## 2024-05-24 DIAGNOSIS — M9903 Segmental and somatic dysfunction of lumbar region: Secondary | ICD-10-CM | POA: Diagnosis not present

## 2024-05-24 DIAGNOSIS — M62830 Muscle spasm of back: Secondary | ICD-10-CM | POA: Diagnosis not present

## 2024-05-31 DIAGNOSIS — M9904 Segmental and somatic dysfunction of sacral region: Secondary | ICD-10-CM | POA: Diagnosis not present

## 2024-05-31 DIAGNOSIS — M9903 Segmental and somatic dysfunction of lumbar region: Secondary | ICD-10-CM | POA: Diagnosis not present

## 2024-05-31 DIAGNOSIS — M9901 Segmental and somatic dysfunction of cervical region: Secondary | ICD-10-CM | POA: Diagnosis not present

## 2024-05-31 DIAGNOSIS — M62830 Muscle spasm of back: Secondary | ICD-10-CM | POA: Diagnosis not present

## 2024-06-02 ENCOUNTER — Other Ambulatory Visit: Payer: Self-pay

## 2024-06-02 DIAGNOSIS — I152 Hypertension secondary to endocrine disorders: Secondary | ICD-10-CM

## 2024-06-02 DIAGNOSIS — E1169 Type 2 diabetes mellitus with other specified complication: Secondary | ICD-10-CM

## 2024-06-03 ENCOUNTER — Other Ambulatory Visit

## 2024-06-03 DIAGNOSIS — E1169 Type 2 diabetes mellitus with other specified complication: Secondary | ICD-10-CM | POA: Diagnosis not present

## 2024-06-03 DIAGNOSIS — I152 Hypertension secondary to endocrine disorders: Secondary | ICD-10-CM

## 2024-06-03 DIAGNOSIS — E1159 Type 2 diabetes mellitus with other circulatory complications: Secondary | ICD-10-CM | POA: Diagnosis not present

## 2024-06-03 DIAGNOSIS — E785 Hyperlipidemia, unspecified: Secondary | ICD-10-CM | POA: Diagnosis not present

## 2024-06-03 LAB — LIPID PANEL

## 2024-06-03 LAB — BAYER DCA HB A1C WAIVED: HB A1C (BAYER DCA - WAIVED): 5.1 % (ref 4.8–5.6)

## 2024-06-04 LAB — CMP14+EGFR
ALT: 17 IU/L (ref 0–32)
AST: 28 IU/L (ref 0–40)
Albumin: 4.1 g/dL (ref 3.7–4.7)
Alkaline Phosphatase: 79 IU/L (ref 48–129)
BUN/Creatinine Ratio: 20 (ref 12–28)
BUN: 16 mg/dL (ref 8–27)
Bilirubin Total: 0.5 mg/dL (ref 0.0–1.2)
CO2: 24 mmol/L (ref 20–29)
Calcium: 9.9 mg/dL (ref 8.7–10.3)
Chloride: 96 mmol/L (ref 96–106)
Creatinine, Ser: 0.82 mg/dL (ref 0.57–1.00)
Globulin, Total: 2.6 g/dL (ref 1.5–4.5)
Glucose: 95 mg/dL (ref 70–99)
Potassium: 4.9 mmol/L (ref 3.5–5.2)
Sodium: 137 mmol/L (ref 134–144)
Total Protein: 6.7 g/dL (ref 6.0–8.5)
eGFR: 70 mL/min/1.73 (ref >59)

## 2024-06-04 LAB — CBC WITH DIFFERENTIAL/PLATELET
Basophils Absolute: 0 x10E3/uL (ref 0.0–0.2)
Basos: 1 %
EOS (ABSOLUTE): 0.2 x10E3/uL (ref 0.0–0.4)
Eos: 3 %
Hematocrit: 36.3 % (ref 34.0–46.6)
Hemoglobin: 12.1 g/dL (ref 11.1–15.9)
Immature Grans (Abs): 0 x10E3/uL (ref 0.0–0.1)
Immature Granulocytes: 0 %
Lymphocytes Absolute: 1.4 x10E3/uL (ref 0.7–3.1)
Lymphs: 29 %
MCH: 33.1 pg — ABNORMAL HIGH (ref 26.6–33.0)
MCHC: 33.3 g/dL (ref 31.5–35.7)
MCV: 99 fL — ABNORMAL HIGH (ref 79–97)
Monocytes Absolute: 0.6 x10E3/uL (ref 0.1–0.9)
Monocytes: 12 %
Neutrophils Absolute: 2.6 x10E3/uL (ref 1.4–7.0)
Neutrophils: 55 %
Platelets: 228 x10E3/uL (ref 150–450)
RBC: 3.66 x10E6/uL — ABNORMAL LOW (ref 3.77–5.28)
RDW: 12.4 % (ref 11.7–15.4)
WBC: 4.9 x10E3/uL (ref 3.4–10.8)

## 2024-06-04 LAB — LIPID PANEL
Chol/HDL Ratio: 3 ratio (ref 0.0–4.4)
Cholesterol, Total: 141 mg/dL (ref 100–199)
HDL: 47 mg/dL (ref >39)
LDL Chol Calc (NIH): 68 mg/dL (ref 0–99)
Triglycerides: 153 mg/dL — ABNORMAL HIGH (ref 0–149)
VLDL Cholesterol Cal: 26 mg/dL (ref 5–40)

## 2024-06-07 DIAGNOSIS — M9903 Segmental and somatic dysfunction of lumbar region: Secondary | ICD-10-CM | POA: Diagnosis not present

## 2024-06-07 DIAGNOSIS — M9904 Segmental and somatic dysfunction of sacral region: Secondary | ICD-10-CM | POA: Diagnosis not present

## 2024-06-07 DIAGNOSIS — M62830 Muscle spasm of back: Secondary | ICD-10-CM | POA: Diagnosis not present

## 2024-06-07 DIAGNOSIS — M9901 Segmental and somatic dysfunction of cervical region: Secondary | ICD-10-CM | POA: Diagnosis not present

## 2024-06-08 ENCOUNTER — Encounter: Payer: Self-pay | Admitting: Family Medicine

## 2024-06-08 ENCOUNTER — Ambulatory Visit: Admitting: Family Medicine

## 2024-06-08 VITALS — BP 128/70 | HR 58 | Temp 97.6°F | Ht 59.0 in | Wt 154.0 lb

## 2024-06-08 DIAGNOSIS — E1159 Type 2 diabetes mellitus with other circulatory complications: Secondary | ICD-10-CM

## 2024-06-08 DIAGNOSIS — I152 Hypertension secondary to endocrine disorders: Secondary | ICD-10-CM | POA: Diagnosis not present

## 2024-06-08 DIAGNOSIS — E785 Hyperlipidemia, unspecified: Secondary | ICD-10-CM

## 2024-06-08 DIAGNOSIS — Z23 Encounter for immunization: Secondary | ICD-10-CM | POA: Diagnosis not present

## 2024-06-08 DIAGNOSIS — E1169 Type 2 diabetes mellitus with other specified complication: Secondary | ICD-10-CM

## 2024-06-08 DIAGNOSIS — Z7984 Long term (current) use of oral hypoglycemic drugs: Secondary | ICD-10-CM | POA: Diagnosis not present

## 2024-06-08 MED ORDER — ESCITALOPRAM OXALATE 20 MG PO TABS: 20.0000 mg | ORAL_TABLET | Freq: Every day | ORAL | 3 refills | Status: AC

## 2024-06-08 MED ORDER — HYDROCHLOROTHIAZIDE 12.5 MG PO TABS: 12.5000 mg | ORAL_TABLET | Freq: Every day | ORAL | 3 refills | Status: AC

## 2024-06-08 MED ORDER — PIOGLITAZONE HCL 30 MG PO TABS: 30.0000 mg | ORAL_TABLET | Freq: Every day | ORAL | 3 refills | Status: AC

## 2024-06-08 MED ORDER — ESOMEPRAZOLE MAGNESIUM 40 MG PO CPDR: 40.0000 mg | DELAYED_RELEASE_CAPSULE | Freq: Every day | ORAL | 3 refills | Status: AC

## 2024-06-08 NOTE — Progress Notes (Signed)
 BP 128/70   Pulse (!) 58   Temp 97.6 F (36.4 C)   Ht 4' 11 (1.499 m)   Wt 154 lb (69.9 kg)   SpO2 97%   BMI 31.10 kg/m    Subjective:   Patient ID: Misty Smith, female    DOB: October 24, 1940, 83 y.o.   MRN: [id number]  HPI: Misty Smith is a 83 y.o. female presenting on 06/08/2024 for Medical Management of Chronic Issues, Hyperlipidemia, and Diabetes (Does her DM med cause edema in ankles?)   Discussed the use of AI scribe software for clinical note transcription with the patient, who gave verbal consent to proceed.  History of Present Illness   Misty Smith is an 83 year old female with hypertension and diabetes who presents for a recheck and review of blood work results.  Glycemic control - Blood sugar levels previously elevated, now within normal range - Hemoglobin A1c is 5.1 - Currently taking pioglitazone  (Actos ) for diabetes management  Hypertension management - Currently taking amlodipine , hydrochlorothiazide , irbesartan , and metoprolol  for blood pressure control - Mild ankle swelling present, subsides at night  Lipid management - LDL cholesterol is 68 mg/dL - Triglycerides have improved since last visit - Atorvastatin  used for cholesterol management - Previous elevation in triglycerides attributed to urinary tract infection  Hematologic status - Hemoglobin and platelet counts are satisfactory  Peripheral sensation and foot care - Feet regularly evaluated by a podiatrist - Intact sensation in feet - Toenails left jagged after recent clipping, plans to address          Relevant past medical, surgical, family and social history reviewed and updated as indicated. Interim medical history since our last visit reviewed. Allergies and medications reviewed and updated.  Review of Systems  Constitutional:  Negative for chills and fever.  HENT:  Negative for ear pain.   Eyes:  Negative for redness and visual disturbance.  Respiratory:  Negative for chest  tightness and shortness of breath.   Cardiovascular:  Positive for leg swelling. Negative for chest pain.  Genitourinary:  Negative for difficulty urinating and dysuria.  Musculoskeletal:  Negative for back pain and gait problem.  Skin:  Negative for rash.  Neurological:  Negative for dizziness, light-headedness and headaches.  Psychiatric/Behavioral:  Negative for agitation and behavioral problems.   All other systems reviewed and are negative.   Per HPI unless specifically indicated above   Allergies as of 06/08/2024       Reactions   Ciprofloxacin Rash   Codeine Rash   Iohexol      Desc: PT HAD HIVES AND WAS GIVEN BENEDRYL PO BY NURSE AND OBSERVED IN NURSES STATION AND RELEASED WITHOUT AND OTHER COMPLICATIONS SHEET SCANNED UNDER NURSES NOTES///ON 12/01/11 PT HAD ANOTHER CT WITH PREMEDS MINUS THE BENADRYL. SHE HAD A DELAYED REACTION AFTER WITH THROAT TIGHTNESS AND SOB, JB  12/07/12   Penicillins Hives   Sulfonamide Derivatives Hives   Asa [aspirin ] Other (See Comments)   Large amounts causes stomach pain   Keflex  [cephalexin ] Nausea And Vomiting   Morphine And Codeine Rash        Medication List        Accurate as of June 08, 2024 10:59 AM. If you have any questions, ask your nurse or doctor.          amLODipine  10 MG tablet Commonly known as: NORVASC  Take 1 tablet (10 mg total) by mouth daily.   aspirin  EC 81 MG tablet Take 1 tablet (81 mg total)  by mouth daily.   atorvastatin  40 MG tablet Commonly known as: LIPITOR Take 1 tablet (40 mg total) by mouth at bedtime.   betamethasone  dipropionate 0.05 % cream Apply topically.   Calcium  Carb-Cholecalciferol 600-800 MG-UNIT Tabs Take 1 tablet by mouth every evening.   cholecalciferol 1000 units tablet Commonly known as: VITAMIN D  Take 2,000 Units by mouth daily.   Clinpro 5000 1.1 % Pste Generic drug: Sodium Fluoride Place onto teeth 2 (two) times daily.   escitalopram  20 MG tablet Commonly known as:  LEXAPRO  Take 1 tablet (20 mg total) by mouth daily.   esomeprazole  40 MG capsule Commonly known as: NEXIUM  Take 1 capsule (40 mg total) by mouth daily.   hydrochlorothiazide  12.5 MG tablet Commonly known as: HYDRODIURIL  Take 1 tablet (12.5 mg total) by mouth daily.   HYDROcodone -acetaminophen  10-325 MG tablet Commonly known as: NORCO Take 1 tablet by mouth 3 (three) times daily as needed.   hydrocortisone 2.5 % cream PLEASE SEE ATTACHED FOR DETAILED DIRECTIONS   irbesartan  75 MG tablet Commonly known as: AVAPRO  Take 1 tablet (75 mg total) by mouth daily.   ketoconazole 2 % cream Commonly known as: NIZORAL Apply topically 2 (two) times daily as needed.   metoprolol  tartrate 25 MG tablet Commonly known as: LOPRESSOR  TAKE 0.5 TABLETS BY MOUTH 2 TIMES DAILY.   multivitamin tablet Take 1 tablet by mouth every morning.   nitrofurantoin  (macrocrystal-monohydrate) 100 MG capsule Commonly known as: Macrobid  Take 1 capsule (100 mg total) by mouth 2 (two) times daily. 1 po BId   pioglitazone  30 MG tablet Commonly known as: ACTOS  Take 1 tablet (30 mg total) by mouth daily.         Objective:   BP 128/70   Pulse (!) 58   Temp 97.6 F (36.4 C)   Ht 4' 11 (1.499 m)   Wt 154 lb (69.9 kg)   SpO2 97%   BMI 31.10 kg/m   Wt Readings from Last 3 Encounters:  06/08/24 154 lb (69.9 kg)  03/22/24 155 lb (70.3 kg)  03/07/24 155 lb (70.3 kg)    Physical Exam Vitals and nursing note reviewed.  Musculoskeletal:        General: No swelling.       VITALS: BP- 128/70 CHEST: Lungs clear to auscultation bilaterally. CARDIOVASCULAR: Heart regular rate and rhythm.       Results for orders placed or performed in visit on 06/03/24  Bayer DCA Hb A1c Waived   Collection Time: 06/03/24  9:29 AM  Result Value Ref Range   HB A1C (BAYER DCA - WAIVED) 5.1 4.8 - 5.6 %  Lipid panel   Collection Time: 06/03/24  9:30 AM  Result Value Ref Range   Cholesterol, Total 141 100 - 199  mg/dL   Triglycerides 846 (H) 0 - 149 mg/dL   HDL 47 >60 mg/dL   VLDL Cholesterol Cal 26 5 - 40 mg/dL   LDL Chol Calc (NIH) 68 0 - 99 mg/dL   Chol/HDL Ratio 3.0 0.0 - 4.4 ratio  CMP14+EGFR   Collection Time: 06/03/24  9:30 AM  Result Value Ref Range   Glucose 95 70 - 99 mg/dL   BUN 16 8 - 27 mg/dL   Creatinine, Ser 9.17 0.57 - 1.00 mg/dL   eGFR 70 >40 fO/fpw/8.26   BUN/Creatinine Ratio 20 12 - 28   Sodium 137 134 - 144 mmol/L   Potassium 4.9 3.5 - 5.2 mmol/L   Chloride 96 96 - 106 mmol/L  CO2 24 20 - 29 mmol/L   Calcium  9.9 8.7 - 10.3 mg/dL   Total Protein 6.7 6.0 - 8.5 g/dL   Albumin 4.1 3.7 - 4.7 g/dL   Globulin, Total 2.6 1.5 - 4.5 g/dL   Bilirubin Total 0.5 0.0 - 1.2 mg/dL   Alkaline Phosphatase 79 48 - 129 IU/L   AST 28 0 - 40 IU/L   ALT 17 0 - 32 IU/L  CBC with Differential/Platelet   Collection Time: 06/03/24  9:30 AM  Result Value Ref Range   WBC 4.9 3.4 - 10.8 x10E3/uL   RBC 3.66 (L) 3.77 - 5.28 x10E6/uL   Hemoglobin 12.1 11.1 - 15.9 g/dL   Hematocrit 63.6 65.9 - 46.6 %   MCV 99 (H) 79 - 97 fL   MCH 33.1 (H) 26.6 - 33.0 pg   MCHC 33.3 31.5 - 35.7 g/dL   RDW 87.5 88.2 - 84.5 %   Platelets 228 150 - 450 x10E3/uL   Neutrophils 55 Not Estab. %   Lymphs 29 Not Estab. %   Monocytes 12 Not Estab. %   Eos 3 Not Estab. %   Basos 1 Not Estab. %   Neutrophils Absolute 2.6 1.4 - 7.0 x10E3/uL   Lymphocytes Absolute 1.4 0.7 - 3.1 x10E3/uL   Monocytes Absolute 0.6 0.1 - 0.9 x10E3/uL   EOS (ABSOLUTE) 0.2 0.0 - 0.4 x10E3/uL   Basophils Absolute 0.0 0.0 - 0.2 x10E3/uL   Immature Granulocytes 0 Not Estab. %   Immature Grans (Abs) 0.0 0.0 - 0.1 x10E3/uL    Assessment & Plan:   Problem List Items Addressed This Visit       Cardiovascular and Mediastinum   Hypertension associated with type 2 diabetes mellitus (HCC)   Relevant Medications   hydrochlorothiazide  (HYDRODIURIL ) 12.5 MG tablet   pioglitazone  (ACTOS ) 30 MG tablet   Other Relevant Orders   Bayer DCA Hb A1c  Waived   CMP14+EGFR   Lipid panel     Endocrine   Hyperlipidemia associated with type 2 diabetes mellitus (HCC) - Primary   Relevant Medications   hydrochlorothiazide  (HYDRODIURIL ) 12.5 MG tablet   pioglitazone  (ACTOS ) 30 MG tablet   Other Relevant Orders   Bayer DCA Hb A1c Waived   CMP14+EGFR   Lipid panel   Type 2 diabetes mellitus with other specified complication (HCC)   Relevant Medications   pioglitazone  (ACTOS ) 30 MG tablet   Other Relevant Orders   Bayer DCA Hb A1c Waived   CMP14+EGFR   Lipid panel       Type 2 diabetes mellitus Well-controlled with A1c of 5.1. Blood sugar levels stable. - Continue pioglitazone  (Actos ).  Hypertension Well-controlled with current medications. Blood pressure 128/70 mmHg. Amlodipine  may cause mild peripheral edema. - Continue amlodipine , hydrochlorothiazide , irbesartan , and metoprolol .  Peripheral edema Mild, likely related to amlodipine  and pioglitazone , resolves at night.  Hyperlipidemia Well-managed with atorvastatin . LDL at 68, triglycerides improved. - Continue atorvastatin .          Follow up plan: Return in about 4 months (around 10/09/2024), or if symptoms worsen or fail to improve, for Diabetes and hyperlipidemia.  Counseling provided for all of the vaccine components Orders Placed This Encounter  Procedures   Bayer DCA Hb A1c Waived   CMP14+EGFR   Lipid panel    Fonda Levins, MD [facility] 06/08/2024, 10:59 AM

## 2024-06-13 ENCOUNTER — Ambulatory Visit
Admission: RE | Admit: 2024-06-13 | Discharge: 2024-06-13 | Disposition: A | Discharge status: Home / Self Care | Source: Ambulatory Visit | Attending: Family Medicine | Admitting: Family Medicine

## 2024-06-13 DIAGNOSIS — Z1231 Encounter for screening mammogram for malignant neoplasm of breast: Secondary | ICD-10-CM | POA: Diagnosis not present

## 2024-06-14 DIAGNOSIS — L84 Corns and callosities: Secondary | ICD-10-CM | POA: Diagnosis not present

## 2024-06-14 DIAGNOSIS — M9903 Segmental and somatic dysfunction of lumbar region: Secondary | ICD-10-CM | POA: Diagnosis not present

## 2024-06-14 DIAGNOSIS — M79675 Pain in left toe(s): Secondary | ICD-10-CM | POA: Diagnosis not present

## 2024-06-14 DIAGNOSIS — M62830 Muscle spasm of back: Secondary | ICD-10-CM | POA: Diagnosis not present

## 2024-06-14 DIAGNOSIS — B351 Tinea unguium: Secondary | ICD-10-CM | POA: Diagnosis not present

## 2024-06-14 DIAGNOSIS — E1142 Type 2 diabetes mellitus with diabetic polyneuropathy: Secondary | ICD-10-CM | POA: Diagnosis not present

## 2024-06-14 DIAGNOSIS — M9904 Segmental and somatic dysfunction of sacral region: Secondary | ICD-10-CM | POA: Diagnosis not present

## 2024-06-14 DIAGNOSIS — M9901 Segmental and somatic dysfunction of cervical region: Secondary | ICD-10-CM | POA: Diagnosis not present

## 2024-06-14 DIAGNOSIS — M79674 Pain in right toe(s): Secondary | ICD-10-CM | POA: Diagnosis not present

## 2024-06-16 DIAGNOSIS — H35352 Cystoid macular degeneration, left eye: Secondary | ICD-10-CM | POA: Diagnosis not present

## 2024-06-16 DIAGNOSIS — H35072 Retinal telangiectasis, left eye: Secondary | ICD-10-CM | POA: Diagnosis not present

## 2024-06-16 DIAGNOSIS — H353111 Nonexudative age-related macular degeneration, right eye, early dry stage: Secondary | ICD-10-CM | POA: Diagnosis not present

## 2024-06-16 DIAGNOSIS — H353123 Nonexudative age-related macular degeneration, left eye, advanced atrophic without subfoveal involvement: Secondary | ICD-10-CM | POA: Diagnosis not present

## 2024-06-16 DIAGNOSIS — D3132 Benign neoplasm of left choroid: Secondary | ICD-10-CM | POA: Diagnosis not present

## 2024-06-16 DIAGNOSIS — M25511 Pain in right shoulder: Secondary | ICD-10-CM | POA: Diagnosis not present

## 2024-06-16 LAB — OPHTHALMOLOGY REPORT-SCANNED

## 2024-06-21 DIAGNOSIS — M9904 Segmental and somatic dysfunction of sacral region: Secondary | ICD-10-CM | POA: Diagnosis not present

## 2024-06-21 DIAGNOSIS — M62830 Muscle spasm of back: Secondary | ICD-10-CM | POA: Diagnosis not present

## 2024-06-21 DIAGNOSIS — M9903 Segmental and somatic dysfunction of lumbar region: Secondary | ICD-10-CM | POA: Diagnosis not present

## 2024-06-21 DIAGNOSIS — M9901 Segmental and somatic dysfunction of cervical region: Secondary | ICD-10-CM | POA: Diagnosis not present

## 2024-07-13 ENCOUNTER — Encounter: Payer: Self-pay | Admitting: Family Medicine

## 2024-07-13 ENCOUNTER — Ambulatory Visit (INDEPENDENT_AMBULATORY_CARE_PROVIDER_SITE_OTHER): Admitting: Family Medicine

## 2024-07-13 VITALS — BP 136/72 | HR 60 | Temp 97.6°F | Ht 59.0 in | Wt 154.0 lb

## 2024-07-13 DIAGNOSIS — R399 Unspecified symptoms and signs involving the genitourinary system: Secondary | ICD-10-CM | POA: Diagnosis not present

## 2024-07-13 LAB — URINALYSIS, COMPLETE
Glucose, UA: NEGATIVE
Nitrite, UA: NEGATIVE
Specific Gravity, UA: 1.02 (ref 1.005–1.030)
Urobilinogen, Ur: 0.2 mg/dL (ref 0.2–1.0)
pH, UA: 6 (ref 5.0–7.5)

## 2024-07-13 LAB — MICROSCOPIC EXAMINATION
Renal Epithel, UA: NONE SEEN /HPF
Yeast, UA: NONE SEEN

## 2024-07-13 MED ORDER — DOXYCYCLINE HYCLATE 100 MG PO TABS: 100.0000 mg | ORAL_TABLET | Freq: Two times a day (BID) | ORAL | 0 refills | Status: AC

## 2024-07-13 NOTE — Progress Notes (Signed)
 BP 136/72   Pulse 60   Temp 97.6 F (36.4 C)   Ht 4' 11 (1.499 m)   Wt 154 lb (69.9 kg)   SpO2 96%   BMI 31.10 kg/m    Subjective:   Patient ID: Misty Smith, female    DOB: [birth year], 83 y.o.   MRN: [id number]  HPI: Misty Smith is a 83 y.o. female presenting on 07/13/2024 for Urinary Tract Infection   Discussed the use of AI scribe software for clinical note transcription with the patient, who gave verbal consent to proceed.  History of Present Illness   The patient presents with urinary symptoms including lower abdominal pain and burning sensation during urination.  Dysuria and lower abdominal pain - Onset of symptoms last week - Lower abdominal pain and mild burning sensation during urination - Initial improvement in pain, followed by recurrence - Increased water intake to alleviate symptoms - No hematuria - Mild body aches - Uncertain about presence of fevers or chills          Relevant past medical, surgical, family and social history reviewed and updated as indicated. Interim medical history since our last visit reviewed. Allergies and medications reviewed and updated.  Review of Systems  Constitutional:  Negative for chills and fever.  Eyes:  Negative for redness and visual disturbance.  Respiratory:  Negative for chest tightness and shortness of breath.   Cardiovascular:  Negative for chest pain and leg swelling.  Gastrointestinal:  Positive for abdominal pain.  Genitourinary:  Positive for frequency and urgency. Negative for difficulty urinating, dysuria, hematuria, vaginal bleeding, vaginal discharge and vaginal pain.  Musculoskeletal:  Positive for back pain. Negative for gait problem.  Skin:  Negative for rash.  Neurological:  Negative for light-headedness and headaches.  Psychiatric/Behavioral:  Negative for agitation and behavioral problems.   All other systems reviewed and are negative.   Per HPI unless specifically indicated  above   Allergies as of 07/13/2024       Reactions   Ciprofloxacin Rash   Codeine Rash   Iohexol      Desc: PT HAD HIVES AND WAS GIVEN BENEDRYL PO BY NURSE AND OBSERVED IN NURSES STATION AND RELEASED WITHOUT AND OTHER COMPLICATIONS SHEET SCANNED UNDER NURSES NOTES///ON 12/01/11 PT HAD ANOTHER CT WITH PREMEDS MINUS THE BENADRYL. SHE HAD A DELAYED REACTION AFTER WITH THROAT TIGHTNESS AND SOB, JB  12/07/12   Penicillins Hives   Sulfonamide Derivatives Hives   Asa [aspirin ] Other (See Comments)   Large amounts causes stomach pain   Keflex  [cephalexin ] Nausea And Vomiting   Morphine And Codeine Rash        Medication List        Accurate as of July 13, 2024  9:11 AM. If you have any questions, ask your nurse or doctor.          amLODipine  10 MG tablet Commonly known as: NORVASC  Take 1 tablet (10 mg total) by mouth daily.   aspirin  EC 81 MG tablet Take 1 tablet (81 mg total) by mouth daily.   atorvastatin  40 MG tablet Commonly known as: LIPITOR Take 1 tablet (40 mg total) by mouth at bedtime.   betamethasone  dipropionate 0.05 % cream Apply topically.   Calcium  Carb-Cholecalciferol 600-800 MG-UNIT Tabs Take 1 tablet by mouth every evening.   cholecalciferol 1000 units tablet Commonly known as: VITAMIN D  Take 2,000 Units by mouth daily.   Clinpro 5000 1.1 % Pste Generic drug: Sodium Fluoride Place onto teeth 2 (two)  times daily.   doxycycline  100 MG tablet Commonly known as: VIBRA -TABS Take 1 tablet (100 mg total) by mouth 2 (two) times daily for 10 days. Started by: Fonda LABOR Daegon Deiss   escitalopram  20 MG tablet Commonly known as: LEXAPRO  Take 1 tablet (20 mg total) by mouth daily.   esomeprazole  40 MG capsule Commonly known as: NEXIUM  Take 1 capsule (40 mg total) by mouth daily.   hydrochlorothiazide  12.5 MG tablet Commonly known as: HYDRODIURIL  Take 1 tablet (12.5 mg total) by mouth daily.   HYDROcodone -acetaminophen  10-325 MG tablet Commonly known  as: NORCO Take 1 tablet by mouth 3 (three) times daily as needed.   hydrocortisone 2.5 % cream PLEASE SEE ATTACHED FOR DETAILED DIRECTIONS   irbesartan  75 MG tablet Commonly known as: AVAPRO  Take 1 tablet (75 mg total) by mouth daily.   ketoconazole 2 % cream Commonly known as: NIZORAL Apply topically 2 (two) times daily as needed.   metoprolol  tartrate 25 MG tablet Commonly known as: LOPRESSOR  TAKE 0.5 TABLETS BY MOUTH 2 TIMES DAILY.   multivitamin tablet Take 1 tablet by mouth every morning.   nitrofurantoin  (macrocrystal-monohydrate) 100 MG capsule Commonly known as: Macrobid  Take 1 capsule (100 mg total) by mouth 2 (two) times daily. 1 po BId   pioglitazone  30 MG tablet Commonly known as: ACTOS  Take 1 tablet (30 mg total) by mouth daily.         Objective:   BP 136/72   Pulse 60   Temp 97.6 F (36.4 C)   Ht 4' 11 (1.499 m)   Wt 154 lb (69.9 kg)   SpO2 96%   BMI 31.10 kg/m   Wt Readings from Last 3 Encounters:  07/13/24 154 lb (69.9 kg)  06/08/24 154 lb (69.9 kg)  03/22/24 155 lb (70.3 kg)    Physical Exam Vitals and nursing note reviewed.  Constitutional:      Appearance: Normal appearance.  Neurological:     Mental Status: She is alert.    Physical Exam   ABDOMEN: Tenderness over lower abdomen and bladder region.         Assessment & Plan:   Problem List Items Addressed This Visit   None Visit Diagnoses       UTI symptoms    -  Primary   Relevant Medications   doxycycline  (VIBRA -TABS) 100 MG tablet   Other Relevant Orders   Urinalysis, Complete   Urine Culture          Urinary tract infection Urinalysis confirmed UTI with elevated WBCs, bacteria, and protein. - Increase water intake to aid in flushing the urinary tract.          Follow up plan: Return if symptoms worsen or fail to improve.  Counseling provided for all of the vaccine components Orders Placed This Encounter  Procedures   Urine Culture   Urinalysis,  Complete    Fonda Levins, MD [facility] 07/13/2024, 9:11 AM

## 2024-07-17 LAB — URINE CULTURE

## 2024-07-18 ENCOUNTER — Ambulatory Visit: Payer: Self-pay | Admitting: Family Medicine

## 2024-07-18 ENCOUNTER — Telehealth: Payer: Self-pay | Admitting: Family Medicine

## 2024-07-18 NOTE — Telephone Encounter (Signed)
 Copied from CRM #[id number]. Topic: Clinical - Lab/Test Results >> Jul 18, 2024  4:27 PM Graeme ORN wrote: Reason for CRM: Patient returned missed call for labs. Read note as written by provider. Patient understood. Would like to schedule 2 week appt. Unsure if she needs office visit or just lab if OV no appt with Dr. Maryanne until 12/19.Thank You.    ----------------------------------------------------------------------- From previous Reason for Contact - Scheduling: Patient/patient representative is calling to schedule an appointment. Refer to attachments for appointment information.

## 2024-07-19 ENCOUNTER — Other Ambulatory Visit: Payer: Self-pay

## 2024-07-19 DIAGNOSIS — N390 Urinary tract infection, site not specified: Secondary | ICD-10-CM

## 2024-07-19 NOTE — Telephone Encounter (Signed)
 Pt should return for lab only. Future orders placed. Appt made for 11/24 at 9:15. Pt aware.

## 2024-07-25 ENCOUNTER — Other Ambulatory Visit

## 2024-07-25 DIAGNOSIS — N390 Urinary tract infection, site not specified: Secondary | ICD-10-CM

## 2024-07-25 LAB — URINALYSIS, COMPLETE
Glucose, UA: NEGATIVE
Nitrite, UA: NEGATIVE
RBC, UA: NEGATIVE
Specific Gravity, UA: 1.02 (ref 1.005–1.030)
Urobilinogen, Ur: 0.2 mg/dL (ref 0.2–1.0)
pH, UA: 5.5 (ref 5.0–7.5)

## 2024-07-25 LAB — MICROSCOPIC EXAMINATION
RBC, Urine: NONE SEEN /HPF (ref 0–2)
Renal Epithel, UA: NONE SEEN /HPF
Yeast, UA: NONE SEEN

## 2024-07-27 ENCOUNTER — Ambulatory Visit: Payer: Self-pay | Admitting: Family Medicine

## 2024-07-28 LAB — URINE CULTURE

## 2024-08-01 ENCOUNTER — Ambulatory Visit: Payer: Self-pay

## 2024-08-01 ENCOUNTER — Telehealth: Payer: Self-pay

## 2024-08-01 DIAGNOSIS — N3001 Acute cystitis with hematuria: Secondary | ICD-10-CM

## 2024-08-01 DIAGNOSIS — R300 Dysuria: Secondary | ICD-10-CM

## 2024-08-01 MED ORDER — NITROFURANTOIN MONOHYD MACRO 100 MG PO CAPS: 100.0000 mg | ORAL_CAPSULE | Freq: Two times a day (BID) | ORAL | 0 refills | Status: DC

## 2024-08-01 NOTE — Telephone Encounter (Signed)
 Copied from CRM #[id number]. Topic: Clinical - Pink Word Triage >> Aug 01, 2024  1:17 PM Shamecia H wrote: patient has completed her antibiotics for her UTI but she is still having symptoms: frequent urination and pain. No blood in urine but cloudy.

## 2024-08-01 NOTE — Telephone Encounter (Signed)
 Sent Macrobid  in for the patient

## 2024-08-01 NOTE — Addendum Note (Signed)
 Addended by: MARYANNE CHEW on: 08/01/2024 04:19 PM   Modules accepted: Orders

## 2024-08-01 NOTE — Telephone Encounter (Signed)
 Multiple calls. Message already sent to Dr Dettinger

## 2024-08-01 NOTE — Telephone Encounter (Signed)
 FYI Only or Action Required?: Action required by provider: update on patient condition and requesting nitrofurantoin  be sent in- pharm confirmed, UTI same if not worse .  Patient was last seen in primary care on 07/13/2024 by Dettinger, Fonda LABOR, MD.  Called Nurse Triage reporting Urinary Frequency.  Symptoms began several weeks ago.  Interventions attempted: Prescription medications: Doxy.  Symptoms are: gradually worsening.  Triage Disposition: See HCP Within 4 Hours (Or PCP Triage)  Patient/caregiver understands and will follow disposition?: Yes  Message from [facility] G sent at 08/01/2024 11:57 AM EST  Summary: UTI has not cleared up   Reason for Triage: patient has completed her antibiotics for her UTI but she is still having symptoms: frequent urination and pain. No blood in urine but cloudy.         Reason for Disposition  [1] Side (flank) or lower back pain AND [2] new-onset since starting antibiotics  Answer Assessment - Initial Assessment Questions Recent lab results note states that resistant to Doxy- to finish Macrobid -- Patient was only prescribed Doxy. Pharmacy confirmed. Please call patient when sent in.   Starting to have some low back/flank pain. Pain with urination, increased frequency. Darker urine occasionally. White cloudy spots in urine Denies fever, CP, SOB, Dizziness. ED/UC precautions advised  1. MAIN SYMPTOM: What is the main symptom you are concerned about? (e.g., painful urination, urine frequency)     Increased frequency and pain increased 2. BETTER-SAME-WORSE: Are you getting better, staying the same, or getting worse compared to how you felt at your last visit to the doctor (most recent medical visit)?     Same to worse 3. PAIN: How bad is the pain?  (e.g., Scale 1-10; mild, moderate, or severe)     Stabbing pain when urinates-  4. FEVER: Do you have a fever? If Yes, ask: What is it, how was it measured, and when did it start?      8/10 5. OTHER SYMPTOMS: Do you have any other symptoms? (e.g., blood in the urine, flank pain, vaginal discharge)     Denies blood- white spots/cloudy, dark tea colored at times, low back pain  6. DIAGNOSIS: When was the UTI diagnosed? By whom? Was it a kidney infection, bladder infection or both?     UTI- by Dettinger  7. ANTIBIOTIC: What antibiotic(s) are you taking? How many times per day?     Doxycycline  8. ANTIBIOTIC - START DATE: When did you start taking the antibiotic?     11/12  Protocols used: Urinary Tract Infection on Antibiotic Follow-up Call - [facility]

## 2024-08-01 NOTE — Telephone Encounter (Signed)
Number busy x 2

## 2024-08-02 ENCOUNTER — Other Ambulatory Visit: Payer: Self-pay

## 2024-08-02 DIAGNOSIS — N3001 Acute cystitis with hematuria: Secondary | ICD-10-CM

## 2024-08-02 DIAGNOSIS — R300 Dysuria: Secondary | ICD-10-CM

## 2024-08-02 MED ORDER — NITROFURANTOIN MONOHYD MACRO 100 MG PO CAPS: 100.0000 mg | ORAL_CAPSULE | Freq: Two times a day (BID) | ORAL | 0 refills | Status: AC

## 2024-08-02 NOTE — Telephone Encounter (Signed)
 Spoke with pt this am. She is aware of Macrobid  being sent to [facility] in [place]. No further concerns.

## 2024-10-05 ENCOUNTER — Telehealth: Payer: Self-pay | Admitting: Family Medicine

## 2024-10-05 DIAGNOSIS — E1159 Type 2 diabetes mellitus with other circulatory complications: Secondary | ICD-10-CM

## 2024-10-05 DIAGNOSIS — E1169 Type 2 diabetes mellitus with other specified complication: Secondary | ICD-10-CM

## 2024-10-05 NOTE — Telephone Encounter (Signed)
 Copied from CRM #[id number]. Topic: Appointments - Scheduling Inquiry for Clinic >> Oct 05, 2024  3:59 PM Emylou G wrote: Reason for CRM: patient wants to order bloodwork for her upcoming appt.

## 2024-10-05 NOTE — Telephone Encounter (Signed)
Future lab orders have been placed.  

## 2024-10-07 ENCOUNTER — Other Ambulatory Visit

## 2024-10-07 DIAGNOSIS — E1159 Type 2 diabetes mellitus with other circulatory complications: Secondary | ICD-10-CM

## 2024-10-07 DIAGNOSIS — E1169 Type 2 diabetes mellitus with other specified complication: Secondary | ICD-10-CM

## 2024-10-12 ENCOUNTER — Ambulatory Visit: Payer: Self-pay | Admitting: Family Medicine

## 2025-03-23 ENCOUNTER — Ambulatory Visit

## 2025-03-24 ENCOUNTER — Ambulatory Visit: Payer: Self-pay
